# Patient Record
Sex: Female | Born: 1945
Health system: Southern US, Community
[De-identification: ages and names within clinical notes are randomized; demographics above are authoritative.]

## PROBLEM LIST (undated history)

## (undated) DIAGNOSIS — E785 Hyperlipidemia, unspecified: Secondary | ICD-10-CM

## (undated) DIAGNOSIS — E119 Type 2 diabetes mellitus without complications: Secondary | ICD-10-CM

## (undated) DIAGNOSIS — F329 Major depressive disorder, single episode, unspecified: Secondary | ICD-10-CM

## (undated) DIAGNOSIS — Z87442 Personal history of urinary calculi: Secondary | ICD-10-CM

## (undated) DIAGNOSIS — F32A Depression, unspecified: Secondary | ICD-10-CM

## (undated) DIAGNOSIS — K589 Irritable bowel syndrome without diarrhea: Secondary | ICD-10-CM

## (undated) DIAGNOSIS — R011 Cardiac murmur, unspecified: Secondary | ICD-10-CM

## (undated) DIAGNOSIS — H9190 Unspecified hearing loss, unspecified ear: Secondary | ICD-10-CM

## (undated) DIAGNOSIS — R748 Abnormal levels of other serum enzymes: Secondary | ICD-10-CM

## (undated) DIAGNOSIS — C801 Malignant (primary) neoplasm, unspecified: Secondary | ICD-10-CM

## (undated) DIAGNOSIS — I10 Essential (primary) hypertension: Secondary | ICD-10-CM

## (undated) HISTORY — DX: Hyperlipidemia, unspecified: E78.5

## (undated) HISTORY — DX: Irritable bowel syndrome, unspecified: K58.9

## (undated) HISTORY — PX: KIDNEY STONE SURGERY: SHX686

## (undated) HISTORY — DX: Abnormal levels of other serum enzymes: R74.8

## (undated) HISTORY — PX: EYE SURGERY: SHX253

## (undated) HISTORY — DX: Essential (primary) hypertension: I10

## (undated) HISTORY — DX: Unspecified hearing loss, unspecified ear: H91.90

## (undated) HISTORY — DX: Type 2 diabetes mellitus without complications: E11.9

## (undated) HISTORY — PX: STAPEDECTOMY: SHX2435

## (undated) HISTORY — DX: Malignant (primary) neoplasm, unspecified: C80.1

## (undated) HISTORY — PX: CHOLECYSTECTOMY: SHX55

## (undated) HISTORY — PX: BACK SURGERY: SHX140

---

## 1980-12-19 HISTORY — PX: TOTAL ABDOMINAL HYSTERECTOMY: SHX209

## 1997-12-19 DIAGNOSIS — C801 Malignant (primary) neoplasm, unspecified: Secondary | ICD-10-CM

## 1997-12-19 HISTORY — DX: Malignant (primary) neoplasm, unspecified: C80.1

## 1997-12-19 HISTORY — PX: OTHER SURGICAL HISTORY: SHX169

## 1998-12-19 DIAGNOSIS — I10 Essential (primary) hypertension: Secondary | ICD-10-CM

## 1998-12-19 HISTORY — DX: Essential (primary) hypertension: I10

## 2003-01-03 ENCOUNTER — Ambulatory Visit (HOSPITAL_COMMUNITY): Admission: RE | Admit: 2003-01-03 | Discharge: 2003-01-03 | Payer: Self-pay | Admitting: Internal Medicine

## 2003-01-03 ENCOUNTER — Encounter (INDEPENDENT_AMBULATORY_CARE_PROVIDER_SITE_OTHER): Payer: Self-pay | Admitting: Internal Medicine

## 2003-06-24 ENCOUNTER — Encounter (INDEPENDENT_AMBULATORY_CARE_PROVIDER_SITE_OTHER): Payer: Self-pay | Admitting: Internal Medicine

## 2003-06-24 ENCOUNTER — Ambulatory Visit (HOSPITAL_COMMUNITY): Admission: RE | Admit: 2003-06-24 | Discharge: 2003-06-24 | Payer: Self-pay | Admitting: Internal Medicine

## 2003-06-26 ENCOUNTER — Encounter (HOSPITAL_COMMUNITY): Admission: RE | Admit: 2003-06-26 | Discharge: 2003-07-26 | Payer: Self-pay | Admitting: Neurosurgery

## 2003-07-29 ENCOUNTER — Encounter (HOSPITAL_COMMUNITY): Admission: RE | Admit: 2003-07-29 | Discharge: 2003-08-28 | Payer: Self-pay | Admitting: Orthopedic Surgery

## 2003-09-10 ENCOUNTER — Encounter: Payer: Self-pay | Admitting: Neurosurgery

## 2003-09-12 ENCOUNTER — Encounter: Payer: Self-pay | Admitting: Neurosurgery

## 2003-09-12 ENCOUNTER — Ambulatory Visit (HOSPITAL_COMMUNITY): Admission: RE | Admit: 2003-09-12 | Discharge: 2003-09-13 | Payer: Self-pay | Admitting: Neurosurgery

## 2004-01-20 ENCOUNTER — Ambulatory Visit (HOSPITAL_COMMUNITY): Admission: RE | Admit: 2004-01-20 | Discharge: 2004-01-20 | Payer: Self-pay | Admitting: Internal Medicine

## 2004-01-20 HISTORY — PX: COLONOSCOPY: SHX174

## 2005-09-06 ENCOUNTER — Ambulatory Visit (HOSPITAL_COMMUNITY): Admission: RE | Admit: 2005-09-06 | Discharge: 2005-09-06 | Payer: Self-pay | Admitting: Family Medicine

## 2005-09-15 ENCOUNTER — Encounter (HOSPITAL_COMMUNITY): Admission: RE | Admit: 2005-09-15 | Discharge: 2005-09-17 | Payer: Self-pay | Admitting: Family Medicine

## 2005-09-18 HISTORY — PX: ESOPHAGOGASTRODUODENOSCOPY: SHX1529

## 2005-09-30 ENCOUNTER — Ambulatory Visit: Payer: Self-pay | Admitting: Internal Medicine

## 2005-10-18 ENCOUNTER — Ambulatory Visit (HOSPITAL_COMMUNITY): Admission: RE | Admit: 2005-10-18 | Discharge: 2005-10-18 | Payer: Self-pay | Admitting: Internal Medicine

## 2005-10-18 ENCOUNTER — Ambulatory Visit: Payer: Self-pay | Admitting: Internal Medicine

## 2005-10-21 ENCOUNTER — Ambulatory Visit (HOSPITAL_COMMUNITY): Admission: RE | Admit: 2005-10-21 | Discharge: 2005-10-21 | Payer: Self-pay | Admitting: Internal Medicine

## 2005-12-01 ENCOUNTER — Ambulatory Visit: Payer: Self-pay | Admitting: Cardiology

## 2005-12-22 ENCOUNTER — Ambulatory Visit: Payer: Self-pay | Admitting: Cardiology

## 2005-12-27 ENCOUNTER — Ambulatory Visit: Payer: Self-pay | Admitting: *Deleted

## 2005-12-27 ENCOUNTER — Ambulatory Visit (HOSPITAL_COMMUNITY): Admission: RE | Admit: 2005-12-27 | Discharge: 2005-12-27 | Payer: Self-pay | Admitting: Cardiology

## 2006-02-13 ENCOUNTER — Ambulatory Visit (HOSPITAL_COMMUNITY): Admission: RE | Admit: 2006-02-13 | Discharge: 2006-02-14 | Payer: Self-pay | Admitting: General Surgery

## 2006-02-13 ENCOUNTER — Encounter (INDEPENDENT_AMBULATORY_CARE_PROVIDER_SITE_OTHER): Payer: Self-pay | Admitting: *Deleted

## 2007-01-29 ENCOUNTER — Ambulatory Visit (HOSPITAL_COMMUNITY): Admission: RE | Admit: 2007-01-29 | Discharge: 2007-01-29 | Payer: Self-pay | Admitting: Family Medicine

## 2008-11-18 ENCOUNTER — Ambulatory Visit (HOSPITAL_COMMUNITY): Admission: RE | Admit: 2008-11-18 | Discharge: 2008-11-18 | Payer: Self-pay | Admitting: Family Medicine

## 2009-01-28 ENCOUNTER — Inpatient Hospital Stay (HOSPITAL_COMMUNITY): Admission: RE | Admit: 2009-01-28 | Discharge: 2009-01-29 | Payer: Self-pay | Admitting: Urology

## 2010-09-24 ENCOUNTER — Ambulatory Visit (HOSPITAL_COMMUNITY): Admission: RE | Admit: 2010-09-24 | Discharge: 2010-09-24 | Payer: Self-pay | Admitting: Family Medicine

## 2010-11-29 ENCOUNTER — Ambulatory Visit (HOSPITAL_COMMUNITY)
Admission: RE | Admit: 2010-11-29 | Discharge: 2010-11-29 | Payer: Self-pay | Source: Home / Self Care | Attending: Family Medicine | Admitting: Family Medicine

## 2010-12-19 DIAGNOSIS — E119 Type 2 diabetes mellitus without complications: Secondary | ICD-10-CM

## 2010-12-19 HISTORY — DX: Type 2 diabetes mellitus without complications: E11.9

## 2011-04-05 LAB — BASIC METABOLIC PANEL
CO2: 26 mEq/L (ref 19–32)
Calcium: 8.5 mg/dL (ref 8.4–10.5)
Chloride: 104 mEq/L (ref 96–112)
Chloride: 106 mEq/L (ref 96–112)
Creatinine, Ser: 0.79 mg/dL (ref 0.4–1.2)
Creatinine, Ser: 0.92 mg/dL (ref 0.4–1.2)
GFR calc Af Amer: >60 mL/min (ref >60)
GFR calc non Af Amer: >60 mL/min (ref >60)
Glucose, Bld: 122 mg/dL — ABNORMAL HIGH (ref 70–99)
Glucose, Bld: 126 mg/dL — ABNORMAL HIGH (ref 70–99)
Glucose, Bld: 160 mg/dL — ABNORMAL HIGH (ref 70–99)
Potassium: 3.8 mEq/L (ref 3.5–5.1)
Potassium: 3.8 mEq/L (ref 3.5–5.1)
Sodium: 137 mEq/L (ref 135–145)
Sodium: 138 mEq/L (ref 135–145)
Sodium: 139 mEq/L (ref 135–145)

## 2011-04-05 LAB — TYPE AND SCREEN: Antibody Screen: NEGATIVE

## 2011-04-05 LAB — CBC
HCT: 31 % — ABNORMAL LOW (ref 36.0–46.0)
MCHC: 33.2 g/dL (ref 30.0–36.0)
MCHC: 33.6 g/dL (ref 30.0–36.0)
MCHC: 34.1 g/dL (ref 30.0–36.0)
MCV: 84.8 fL (ref 78.0–100.0)
Platelets: 189 10*3/uL (ref 150–400)
RBC: 3.66 MIL/uL — ABNORMAL LOW (ref 3.87–5.11)
WBC: 7.7 10*3/uL (ref 4.0–10.5)

## 2011-04-05 LAB — ABO/RH: ABO/RH(D): A NEG

## 2011-05-03 NOTE — Op Note (Signed)
Christie Christie [name], Christie Christie [name].:  [id number]   MEDICAL RECORD Christie [name].:  [id number]          PATIENT TYPE:  INP   LOCATION:  0001                         FACILITY:  [facility]   PHYSICIAN:  Christie Christie [name], M.D.  DATE OF BIRTH:  [birth year]   DATE OF PROCEDURE:  01/28/2009  DATE OF DISCHARGE:                               OPERATIVE REPORT   ATTENDING SURGEON:  Dr. Barron [name].   ASSISTANT:  Dr. Duane [name].   PREOPERATIVE DIAGNOSIS:  Left staghorn renal calculus, greater than 2  cm.   POSTOPERATIVE DIAGNOSES:  1. Left staghorn renal calculus, greater than 2 cm.  2. Left proximal ureteral stone.   PROCEDURES:  1. Left percutaneous nephrolithotomy for stone greater than 2 cm.  2. Renal pelvic basket stone extraction.  3. Left ureteroscopy with basket stone extraction.  4. Placement of a left nephrostomy tube.  5. Left pyelogram.   INDICATIONS:  Christie Christie [name] is a 65 year old female who was referred for  evaluation of the left partial staghorn calculus.  This was initially  diagnosed in December but treatment was delayed due to the patient's  need to take care of her husband.  Her surgery was ultimately scheduled  for today on an elective basis.  She was sent to interventional  radiology for placement of her percutaneous tube.  Please note prior to  the procedure, cultures from the clinic were treated with culture  specific antibiotics.  Risks and benefits have been discussed with the  patient preoperatively.   FINDINGS:  1. Left renal pelvic stone, staghorn, greater than 2 cm.  2. Left proximal ureteral stone.   PROCEDURE IN DETAIL:  The patient was brought back to the operating room  and after the successful induction of general endotracheal anesthetic,  she was placed in the prone position and all pressure points were padded  appropriately and SCDs were in place.  A Foley catheter was placed prior  to positioning her prone.  She received IV culture specific  antibiotics.   Interventional radiology team was present and they dilated the  previously placed nephrostomy tube site.  Once dilation was complete  with the balloon dilator, an access sheath was placed into the renal  collecting system.  At this point, the urology team took over the  control of the patient's care.   At this point, using the rigid renal scope, the collecting system of the  left kidney was visualized.  A large-volume stone could be seen  throughout the pelvis and lower pole of the kidney.  Using the  Lithoclast device, with a combination of ultrasound and pneumatic  energy, the stone was broken into small pieces and suctioned out.  Larger fragments were grasped with a two-pronged grasper and removed in  their entirety.  Once the renal pelvis and lower pole were cleaned out  with Christie [name] visible stone seen fluoroscopically, the flexible cystoscope was  used to examine all the calyces.  Christie [name] further stones could be seen and at  this point, we used the ureteroscope to navigate down the ureter.  In  the  proximal portion of the ureter, a friable stone could be seen which  was grasped with a basket and removed.  We then completed ureteroscopy  by navigating the ureteroscope all the way down to the UVJ.  Christie [name] further  stone or particles could be seen.  At this point the 18-French Council  tip catheter was placed in the collecting system under fluoroscopic  guidance.  A pyelogram was shot by inserting contrast into the catheter  and the collecting system was identified.  3 mL of normal saline were  placed in the nephrostomy balloon and the nephrostomy tube was secured  the skin with two silk sutures.  At this point the procedure was ended.  Please note Dr. Isabel Christie [name] was the responsible surgeon and present  throughout the entirety of the case.   ESTIMATED BLOOD LOSS:  Minimal.   URINE OUTPUT:  Unrecorded.   DRAINS:  1. Foley catheter.  2. Left nephrostomy tube.   SPECIMENS:  Stone  for stone analysis.   DISPOSITION:  The patient will go the PACU for further.      Christie Kitten, MD      Christie Christie [name], M.D.  Electronically Signed    DW/MEDQ  D:  01/28/2009  T:  01/28/2009  Job:  [id number]

## 2011-05-06 NOTE — Op Note (Signed)
NAME:  Christie [name], Christie [name]                             ACCOUNT NO.:  [id number]   MEDICAL RECORD NO.:  [id number]                   PATIENT TYPE:  AMB   LOCATION:  DAY                                  FACILITY:  [facility]   PHYSICIAN:  Lionel December, M.D.                 DATE OF BIRTH:  May 01, 1946   DATE OF PROCEDURE:  01/20/2004  DATE OF DISCHARGE:                                 OPERATIVE REPORT   PROCEDURE:  Colonoscopy with ileoscopy, terminal ileoscopy.   ENDOSCOPIST:  Lionel December, M.D.   INDICATIONS:  Autumnrose is a 65 year old Caucasian female who has been  experiencing bilateral lower quadrant abdominal pain also with change in her  bowel habits. It is suspected that she may have IBS.  We have arranged  colonoscopy to make sure that she does not have inflammatory bowel disease  or colonic neoplasm.  The procedure and risks were reviewed with the patient  and informed consent was obtained.   PREOPERATIVE MEDICATIONS:  Cetacaine spray for topical oropharyngeal  anesthesia.  Demerol 50 mg IV and Versed 6 mg IV in divided dose.   FINDINGS:  Procedure performed in endoscopy suite.  The patient's vital  signs and O2 saturation were monitored during the procedure and remained  stable.  The patient was placed in the left lateral recumbent position and  rectal examination was performed.  No abnormality noted on external or  digital exam.   Olympus videoscope was placed in the rectum and advanced under vision into  the sigmoid colon and beyond.  Preparation was satisfactory.  The scope was  passed to the cecum which was identified by intravenous and appendiceal  orifice.  A short segment of TL was also examined and was normal.  Pictures  were taken for the record.  As the scope was withdrawn the colonic mucosa  was, once again, carefully examined and was normal throughout.  Rectal  mucosa similarly was normal.  The scope was retroflexed to examine the  anorectal junction and a single small  papilla was noted along with 2 tiny  hemorrhoids below the dentate line.   The endoscope was straightened and withdrawn.  The patient tolerated the  procedure well.   FINAL DIAGNOSES:  1. Small external hemorrhoids and a single anal papilla, otherwise normal     colonoscopy.  2. Normal terminal ileum.  3. Suspect symptoms are due to irritable bowel syndrome.   RECOMMENDATIONS:  1. High fiber diet.  2. Citrucel 1 tablespoonful daily.  3. Levbid 1/2 to 1 tablet every morning.  4. If she does not notice improvement in symptoms she will call our office     with progress report in a few weeks.  If this therapy does not work we     will consider dicyclomine.      ___________________________________________  Lionel December, M.D.   NR/MEDQ  D:  01/20/2004  T:  01/20/2004  Job:  [id number]   cc:   Ramon Dredge L. Juanetta Gosling, M.D.  [address]  [place]  [place] [address]  Fax: [phone]

## 2011-05-06 NOTE — Consult Note (Signed)
NAMEAMARIYAH, BAZAR NO.:  [id number]   MEDICAL RECORD NO.:  [id number]                  PATIENT TYPE:   LOCATION:                                       FACILITY:   PHYSICIAN:  Lionel December, M.D.                 DATE OF BIRTH:  [birth year]   DATE OF CONSULTATION:  12/30/2003  DATE OF DISCHARGE:                                   CONSULTATION   GI CONSULT   REQUESTING PHYSICIAN:  Oneal Deputy. Juanetta Gosling, M.D.   HISTORY OF PRESENT ILLNESS:  This patient is a 65 year old Caucasian female  who has been followed in our office for transaminitis most recently.  Today  she comes in with complaints of bilateral lower quadrant abdominal pain and  cramping, typically in the morning as well as postprandially.  She reports  that her bowel movements are between 1-4 per day. They are non-mucus-like  and without any melena or rectal bleeding.  She did undergo flexible  sigmoidoscopy by Dr. Karilyn Cota in 2000 which was normal to 50 cm on July 05, 1999.  She has never had screening colonoscopy.   She was last seen in our office on June 20, 2003 for elevated LFTs.  This has  resolved and only SGPT is slightly elevated at 54 on December 03, 2003 as  she has discontinued her Lipitor.  She was started on Prilosec approximately  2 weeks ago and this has not helped with the pain, although her pain is  generally in the lower abdominal area.  She does have a history of a small  pancreatic 13 x 9 mm, a small left renal cyst 3.1 cm in greatest diameter  and a few nonspecific upper normal size mesenteric lymph nodes in the small  bowel mesentery and left upper quadrant.  However, CT scan from June 24, 2003  was essentially stable.   CURRENT MEDICATIONS:  1. Avalide 12.5 mg daily.  2. Votrex 1 tablet daily.  3. Over-the-counter antacids p.r.n.  4. Prilosec 20 mg daily.   ALLERGIES:  PERCOCET (withdrawal symptoms).   PAST MEDICAL HISTORY:  1. Normal flexible sigmoidoscopy by Dr.  Karilyn Cota in 2000.  2. Vulvar carcinoma with surgical removal in 1999 by Dr. Marlane Hatcher.  3. Hypertension.  4. Hyperlipidemia.  5. Chronic back pain.  6. Back surgery with disk repair of L4 and L5 by Dr. __________.  7. Total abdominal hysterectomy in 1983.  8. Bilateral ear surgeries.   FAMILY HISTORY:  No known family history of colorectal carcinoma.  There is  a strong family history of pancreatic carcinoma with mother diagnosed at age  74, deceased at age 22; and brother deceased at age 51.  She also reports  her brother had a rare blood dyscrasia.  She has 1 sister, deceased, with  renal carcinoma; and 1 brother deceased secondary to MVA.  Father deceased  at age  84 with dementia.   SOCIAL HISTORY:  Ms. Kearse has been married for 39 years. She has 1  granddaughter who is healthy.  She is currently retired; however, she baby  sits her grandchildren 3 days a week. She denies any tobacco, alcohol, or  drug use.   REVIEW OF SYSTEMS:  CONSTITUTIONAL:  Weight is steadily increased. Denies  any fever or chills.  Denies any fatigue.  Appetite is good. CARDIOVASCULAR:  Denies any chest pain or palpitations.  PULMONARY:  Denies any shortness of  breath, cough or dyspnea.  SKIN:  Denies any rash or jaundice.  GASTROINTESTINAL:  See HPI.  Denies any dysphagia or odynophagia.  Denies  any heartburn or indigestion.   PHYSICAL EXAMINATION:  VITAL SIGNS:  Weight 191 pounds.  Blood pressure  120/82, pulse 70.  GENERAL:  This patient is a 65 year old Caucasian female who is alert,  oriented, pleasant and cooperative.  HEENT:  Sclerae are clear, nonicteric. Conjunctivae pink. Oropharynx pink  and moist without any lesions.  NECK:  Supple without any masses or thyromegaly.  HEART:  Regular rate and rhythm without murmurs, clicks, rubs or gallops.  Normal S1-S2.  LUNGS:  Clear to auscultation bilaterally.  ABDOMEN:  Positive bowel sounds x4.  Soft, nontender, nondistended with no  palpable mass or  organomegaly.  RECTAL:  There are small 1-2 cm external hemorrhoids visualized.  No  erythema or active bleeding.  Good sphincter tone.  There is a small amount  of light brown Hemoccult negative stool obtained.  EXTREMITIES:  2+ pedal pulses bilaterally.  No edema.  SKIN:  Pink, warm and dry, without any rash or jaundice.   LABORATORY STUDIES:  Last LFTs from December 03, 2003:  Total bilirubin 0.7,  direct 0.1, indirect 0.6, alkaline phosphatase 103, SGOT 34, SGPT 54, total  protein 6.7, albumin 4.0.   ASSESSMENT:  1. This patient is a 65 year old Caucasian female with relatively new onset     of bilateral lower quadrant abdominal pain and cramping; may be a variant     of irritable bowel syndrome given some loose stools as well; however, she     has never had screening colonoscopy and would like to perform this exam     to rule out colorectal carcinoma.  2. Elevated transaminitis; this has resolved quite well given     discontinuation of Lipitor.  We will repeat LFTs in approximately 1     month.   RECOMMENDATIONS:  1. Screening colonoscopy is scheduled with Dr. Karilyn Cota.  I have discussed the     risks and benefits of this procedure with the patient which include, but     are not limited to bleeding, perforation and infection.  She agrees with     this     plan. Consent will be obtain.  2. Will obtain labs today to include CBC and sedimentation rate.  3. Further recommendations and follow up pending colonoscopy.     ________________________________________  ___________________________________________  Nicholas Lose, N.P.                  Lionel December, M.D.   KC/MEDQ  D:  12/30/2003  T:  12/30/2003  Job:  [id number]   cc:   Ramon Dredge L. Juanetta Gosling, M.D.  [address]  [place]  [place] [address]  Fax: [phone]   Lionel December, M.D.  [address]  [place]  [place] [address]  Fax: [phone]

## 2011-05-06 NOTE — Procedures (Signed)
[name], Christie [name] NO.:  [id number]   MEDICAL RECORD NO.:  [id number]          PATIENT TYPE:  OUT   LOCATION:  RAD                           FACILITY:  [facility]   PHYSICIAN:  [place] Bing, M.D. [facility] BIRTH:  May 20, 1946   DATE OF PROCEDURE:  12/27/2005  DATE OF DISCHARGE:                                  ECHOCARDIOGRAM   PROCEDURE:  Echocardiogram.   CLINICAL DATA:  A 66 year old woman with a systolic murmur and hypertension.  M-mode aorta 2.8, left atrium 3.9, septum 1.3, posterior wall 1.1, LV  diastole 3.9, LV systole 2.4.  1.  Technically adequate echocardiographic study.  2.  Left atrial size at the upper limit of normal; normal right atrium and      right ventricle.  3.  Trileaflet and normal aortic valve; trivial aortic insufficiency.  4.  Normal aortic root.  5.  Slight mitral valve thickening; mild annular calcification.  6.  Normal tricuspid valve with physiologic regurgitation and slightly      increased estimated RV systolic pressure.  7.  Normal pulmonic valve and proximal pulmonary artery.  8.  Normal IVC.  9.  Normal left ventricular size; borderline hypertrophy; normal regional      and global function.      [place] Bing, M.D. [facility]  Electronically Signed     RR/MEDQ  D:  12/27/2005  T:  12/28/2005  Job:  [id number]

## 2011-05-06 NOTE — Op Note (Signed)
Christie [name], Christie [name]                   ACCOUNT NO.:  [id number]   MEDICAL RECORD NO.:  [id number]          PATIENT TYPE:  AMB   LOCATION:  DAY                           FACILITY:  [facility]   PHYSICIAN:  Lionel December, M.D.    DATE OF BIRTH:  1946-04-13   DATE OF PROCEDURE:  10/18/2005  DATE OF DISCHARGE:                                 OPERATIVE REPORT   PROCEDURE:  Esophagogastroduodenoscopy.   INDICATIONS:  Johniya is a 65 year old Caucasian female with over two-month  history of recurrent chest pain. Noninvasive cardiac evaluations have been  negative. She has not found any triggers other than pain may be experienced  with her meals. She has been on PPI for two weeks but does not report any  improvement. She has history of pancreatic cyst which has been stable on  sequential CTs.   Procedure risks were reviewed with the patient, and informed consent was  obtained.   PREMEDICATION:  Cetacaine spray for pharyngeal topical anesthesia, Demerol  50 mg IV, Versed 6 mg IV in divided dose.   FINDINGS:  Procedure performed in endoscopy suite. The patient's vital signs  and O2 saturation were monitored during the procedure and remained stable.  The patient was placed left lateral position, and Olympus videoscope was  passed oropharynx without any difficulty into esophagus.   Esophagus. Mucosa of the esophagus normal throughout. GE junction at 39 cm.  No hernia was noted.   Stomach. It was empty and distended very well insufflation. Folds of  proximal stomach were normal. Examination of mucosa revealed few small  antral erosions but no ulcer crater was found. Angularis, fundus and cardia  were examined by retroflexing the scope were normal.   Duodenum. Bulbar mucosa was normal. Scope was passed to the second part of  duodenum where mucosa and folds were normal. Endoscope was withdrawn. The  patient tolerated the procedure well.   FINAL DIAGNOSIS:  Erosive antral gastritis, otherwise normal  EGD.   I doubt that this will explain the patient's symptomatology.   RECOMMENDATIONS:  1.  She will continue anti-reflux measures and PPI for now.  2.  H pylori serology.  3.  Upper abdominal ultrasound.      Lionel December, M.D.  Electronically Signed     NR/MEDQ  D:  10/18/2005  T:  10/18/2005  Job:  [id number]   cc:   Lorin Picket A. Gerda Diss, MD  Fax: [phone]

## 2011-05-06 NOTE — Op Note (Signed)
Christie [name], Christie [name] NO.:  [id number]   MEDICAL RECORD NO.:  [id number]          PATIENT TYPE:  OIB   LOCATION:  5731                         FACILITY:  [facility]   PHYSICIAN:  Adolph Pollack, M.D.DATE OF BIRTH:  March 19, 1946   DATE OF PROCEDURE:  02/13/2006  DATE OF DISCHARGE:                                 OPERATIVE REPORT   PREOP DIAGNOSIS:  Symptomatic cholelithiasis.   POSTOP:  Symptomatic cholelithiasis.   PROCEDURE:  Laparoscopic cholecystectomy with intraoperative cholangiogram.   SURGEON:  Rosenbower.   ASSISTANT:  Thomas Cornett.   ANESTHESIA:  General.   INDICATIONS:  Christie [name] is a 65 year old female who has had some pressure-  type sensation mid chest which were with shortness of breath. She had a  Cardiolite scan which was unremarkable. She was discovered to have  gallstones but no gallbladder wall thickening.  It was felt that possibly  these symptoms could be related to her gallbladder and she now presents for  laparoscopic cholecystectomy.   TECHNIQUE:  She was seen in the holding area and brought to the operating  room, placed on the operating table and a general anesthetic was  administered. The abdominal wall was sterilely prepped and draped. Dilute  Marcaine solution was infiltrated in the subumbilical region and a small  subumbilical incision was made through the skin and subcutaneous tissue  until the midline fascia was exposed. Incision was made in the midline  fascia and peritoneum entering the peritoneal cavity under direct vision. A  pursestring suture of 0 Vicryl was placed around the fascial edges. A Hasson  trocar was introduced into the peritoneal cavity and pneumoperitoneum  created by insufflation of CO2 gas.   Next a laparoscope was introduced. She is placed in reverse Trendelenburg  position, the right side tilted slightly upward. An 11-mm trocar was placed  through an epigastric incision and two 5 mm trocars were  placed in the right  mid lateral abdomen. The fundus of the gallbladder was identified, grasped  and retracted toward the right shoulder. No significant adhesions were noted  to the gallbladder and omentum. There were some adhesions between the  infundibulum, the gallbladder and duodenum which were taken down bluntly.  Using careful blunt dissection. I mobilized the infundibulum. I then  isolated the cystic duct and created a window around it. A clip was placed  at the cystic duct gallbladder junction and a small incision made in the  cystic duct. A Cholangiocatheter was placed into the cystic duct and a  cholangiogram was performed.   Under real time real-time fluoroscopy, dilute contrast material was injected  to the cystic duct which was of moderate length. The common hepatic, right  hepatic, and left hepatic ducts all filled promptly. The common bile duct  also filled promptly and draped contrast into the duodenum without obvious  evidence of obstruction. Final reports pending radiologist's interpretation.   Following this, the cholangiocatheter was removed, the cystic duct was  clipped three times proximally and divided. I then proceeded to identify the  posterior and anterior branch of the cystic  artery. These were isolated,  clipped and divided. The gallbladder was dissected free from the liver bed  intact using electrocautery. Small amount of bile did leak out the  gallbladder area.  Gallbladder was placed in Endopouch bag.   The gallbladder fossa was irrigated. Bleeding points controlled with  electrocautery. It was irrigated again and reinspected. No bleeding or bile  leak was noted. Irrigation fluid was then evacuated.   The gallbladder was removed in the Endopouch bag through the subumbilical  port. The subumbilical fascial defect closed under laparoscopic vision by  tightening up and tying down the pursestring suture. Remaining trocars were  removed and pneumoperitoneum  was released. The skin incisions were closed  with 4-0 Monocryl subcuticular stitches followed by Steri-Strips and sterile  dressings.   She tolerated procedure without any apparent complications and was taken to  recovery in satisfactory condition.      Adolph Pollack, M.D.  Electronically Signed     TJR/MEDQ  D:  02/13/2006  T:  02/14/2006  Job:  [id number]   cc:   Lionel December, M.D.  [address]  [place]  [place] [address]   Scott A. Gerda Diss, MD  Fax: [phone]

## 2011-05-06 NOTE — Op Note (Signed)
NAME:  Christie [name]                             ACCOUNT NO.:  [id number]   MEDICAL RECORD NO.:  [id number]                   PATIENT TYPE:  OIB   LOCATION:  3009                                 FACILITY:  [facility]   PHYSICIAN:  Danae Orleans. Venetia Maxon, M.D.               DATE OF BIRTH:  May 27, 1946   DATE OF PROCEDURE:  09/12/2003  DATE OF DISCHARGE:  09/13/2003                                 OPERATIVE REPORT   PREOPERATIVE DIAGNOSIS:  Herniated lumbar disk, L4-5 left with spondylosis,  degenerative disk disease and lumbar radiculopathy.   POSTOPERATIVE DIAGNOSIS:  Herniated lumbar disk, L4-5 left with spondylosis,  degenerative disk disease and lumbar radiculopathy.   PROCEDURE:  Left L4-5 microdiskectomy with microdissection.   SURGEON:  Danae Orleans. Venetia Maxon, M.D.   ANESTHESIA:  General endotracheal anesthesia.   ESTIMATED BLOOD LOSS:  Minimal.   COMPLICATIONS:  None.   DISPOSITION:  Recovery.   INDICATIONS FOR PROCEDURE:  Christie [name] is a 65 year old woman with an L5  radiculopathy on the left with a herniated disk and foraminal stenosis with  facet hypertrophy and lateral recess stenosis. It was elected to take her to  surgery for microdiskectomy.   DESCRIPTION OF PROCEDURE:  Christie [name] was brought to the operating room.  Following the satisfactory and uncomplicated induction of general  endotracheal anesthesia  and placement  of intravenous lines, the patient  was placed in the prone position  on the operating table on the Wilson  frame. Her low back was then prepped and draped in the usual sterile  fashion. The area of planned incision was infiltrated with 0.25% Marcaine  with 0.5% Lidocaine and 1:200,000 epinephrine.   An incision was made in the midline over the L4-5 interspace and carried  through copious adipose tissue  to the lumbodorsal fascia which was incised  on the left side of the midline. Subperiosteal dissection was performed  exposing the interspace. A marker probe was  placed which demonstrated this  to be the L3-4 interspace. Subsequently a more caudal dissection was  performed, exposing  the L4-5 interspace and an additional x-ray confirmed  this to be the correct  level.   A hemi semilaminectomy of L4 was then performed using the high-speed drill  and a variety of Kerrison rongeurs. The common dural tube was decompressed  and a foraminotomy was done overlying the L5 nerve root. A medial  facetectomy was performed as well. This joint  appeared  to be quite  unhealthy with synovial cyst and also a significant space between  the  inferior facet of L4 and the superior facet of L5. This area and lateral  recess were decompressed and the lateral aspect of the spinal canal was  decompressed.   The microscope was brought into the field, and using microdissection  technique, the lateral  thecal sac was mobilized medially, exposing a  foraminal and preforaminal disk  herniation. The bipolar electrocautery was  used to cauterize the epidural veins. The D'Errico nerve root retractor was  utilized to mobilize the  thecal sac medially and the interspace was incised  with a #15 blade. Disk material was removed in a piecemeal fashion and  medial and lateral aspects of the disk space were evacuated of residual disk  material using a variety of pituitary rongeurs and Epstein curets.   The  thecal sac was felt to be well decompressed as was the L5 nerve root.  The foramen was also felt to be well decompressed. Hemostasis was assured  with bipolar electrocautery. The spinal canal was palpated and felt to be  well decompressed. There did not appear to be any residual disk material.   The self-retaining retractor was removed. The microscope was taken out of  the field. The lumbodorsal fascia was closed with 0 Vicryl suture. The  subcutaneous tissue was reapproximated with 2-0 Vicryl interrupted inverted  sutures and the skin edges were reapproximated with interrupted  3-0 Vicryl  subcuticular stitch. The wound was dressed with Dermabond.   The patient was extubated in the operating room and taken to the recovery  room in stable satisfactory condition having tolerated  the operation well.  All counts were correct at the end of the case.                                               Danae Orleans. Venetia Maxon, M.D.    JDS/MEDQ  D:  09/12/2003  T:  09/13/2003  Job:  [id number]

## 2011-05-06 NOTE — Consult Note (Signed)
[name], [name]                   ACCOUNT NO.:  [id number]   MEDICAL RECORD NO.:  [id number]          PATIENT TYPE:  AMB   LOCATION:                                FACILITY:  [facility]   PHYSICIAN:  Lionel December, M.D.    DATE OF BIRTH:  1946-04-15   DATE OF CONSULTATION:  09/30/2005  DATE OF DISCHARGE:                                   CONSULTATION   GASTROENTEROLOGY CONSULTATION:   CHIEF COMPLAINT:  Chest pain.   PHYSICIAN REQUESTING CONSULTATION:  Dr. Lilyan Punt   HISTORY OF PRESENT ILLNESS:  Patient is a 64 year old Caucasian female who  presents today for further evaluation of 70-month history of chest  heaviness/pressure.  This is sometimes associated with shortness of breath.  She also notes it if she eats too much at once.  Symptoms are intermittent.  She notes that when she lays down it seems to get better.  She has had some  mild typical heartburn symptoms.  No dysphagia, odynophagia, or abdominal  pain.  Bowel movements are regular.  No melena or rectal bleeding.  She has  never had an upper endoscopy.   She has had a Cardiolite which was negative.  She had an unremarkable chest  x-ray.  LFTs recently were normal.  She does have hypercholesterolemia with  elevated LDL.  She has not been on statins due to her elevated LFTs in the  past.  She recently started Zetia.  According to Dr. Fletcher Anon notes, there  were instructions for her to be on Aciphex.  She states she was never given  these instructions.  She has not been on any antacids or PPIs.  She takes an  aspirin one or two a day usually for pains or headache.   CURRENT MEDICATIONS:  1.  Avalide 12.5 mg daily.  2.  Hyoscyamine 0.375 mg half tablet daily.  3.  Zetia 10 mg daily.  4.  Aspirin one to two daily.   ALLERGIES:  No known drug allergies; however, she did have difficulty  withdrawing from [facility] which she used at some point for chronic back  pain.   PAST MEDICAL HISTORY:  1.  Colonoscopy with terminal  ileoscopy in February 2005 revealed small      external hemorrhoids and a single anal papula.  2.  IBS.  3.  Vulvar carcinoma with surgical removal in 1999 by Dr. Marlane [name].  4.  Hypertension.  5.  Hyperlipidemia.  6.  Chronic back pain with lumbar disk surgery.  7.  She had a total abdominal hysterectomy in 1983.  8.  Bilateral ear surgeries.   FAMILY HISTORY:  Negative for colorectal cancer.  She has a strong family  history of pancreatic cancer with mother diagnosed at age 68 deceased at age  56, brother deceased age 50.  She also reports a brother had rare blood  dyscrasia.  She had a sister deceased with renal carcinoma and another  brother deceased secondary to MVA.   SOCIAL HISTORY:  She is married.  She has a daughter with two grandchildren.  She is retired,  does not smoke or drink alcohol.   REVIEW OF SYSTEMS:  CONSTITUTIONAL:  No weight loss.  CARDIOPULMONARY:  No  chest pain.  No shortness of breath as outlined above.  She complains more  of dyspnea on exertion with climbing the stairs.  GI:  See HPI.   PHYSICAL EXAMINATION:  VITAL SIGNS:  Weight 196.5 (up 5 pounds since we last  saw her), temperature 98, blood pressure 134/82, pulse 72, height 5 feet 3-  1/2 inches.  GENERAL:  Pleasant, well-nourished, well-developed Caucasian female in no  acute distress.  SKIN:  Warm and dry.  No jaundice.  HEENT:  Pupils equal, round and reactive to light.  Conjunctivae are pink.  Sclerae nonicteric.  Oropharyngeal mucosa moist and pink.  No lesions,  erythema, or exudate.  No lymphadenopathy or thyromegaly.  CHEST:  Lungs are clear to auscultation.  Cardiac exam reveals regular rate  and rhythm, normal S1-S2.  No murmurs, rubs, or gallops.  ABDOMEN:  Positive bowel sounds, soft, nontender, nondistended.  No  organomegaly or masses.  No rebound tenderness or guarding.  No abdominal  bruits or hernias.  EXTREMITIES:  No edema.   IMPRESSION:  Christie [name] is a 65 year old lady who has a  42-month history of  atypical chest pain which is felt to be noncardiac in origin.  She describes  it as heaviness in her chest sometimes related to meals but other times not.  She has very mild typical reflux symptoms.  She has not been on proton pump  inhibitor therapy as of yet.  She is quite concerned given her significant  family history of various cancers.  I have offered her a trial of proton  pump inhibitor therapy prior to upper endoscopy.  We will begin Protonix 40  mg daily and 30 samples given.  We will go ahead and put her on the schedule  for upper endoscopy for 1-1/2 to 2 weeks in case she does not respond, that  way she will already be on the schedule.  If she is not significantly  improved, I have urged her to proceed with EGD as planned.  I would like to  thank Dr. Lilyan Punt for allowing [place] to take part in the care of this  patient.      Tana Coast, P.A.      Lionel December, M.D.  Electronically Signed    LL/MEDQ  D:  09/30/2005  T:  09/30/2005  Job:  [id number]   cc:   Lorin Picket A. Gerda Diss, MD  Fax: [phone]

## 2011-05-06 NOTE — Procedures (Signed)
NAME:  Christie [name], BUENAVENTURA                   ACCOUNT NO.:  [id number]   MEDICAL RECORD NO.:  [id number]          PATIENT TYPE:  OUT   LOCATION:  RAD                           FACILITY:  [facility]   PHYSICIAN:  Scott A. Gerda Diss, MD    DATE OF BIRTH:  1946/04/08   DATE OF PROCEDURE:  DATE OF DISCHARGE:  09/06/2005                                    STRESS TEST   PROCEDURE:  Stress Myoview.   INDICATIONS:  Chest discomfort and tightness.   RISK FACTORS:  On a resting EKG there is a no acute ST segment changes noted  on the resting EKG.   DESCRIPTION OF PROCEDURE:  Heart rate response to exercise; the patient's  heart rate gradually increased.  She hit her target heart rate of 135 near  the end of the second stage.   ST segment response to exercise; the patient had ST segment depressions in  lead 2, 3, AVF, and V5 and 6.  The patient did have slight shortness of  breath with activity.  The ST segments were specifically significant in lead  2 and also in AVF, not quite in V5 and V6.  Those were upsloping at 0.08  past the J point, where it looked okay.   Recovery phase; the patient's ST segment and response gradually resolved  over the course of the next several minutes and at five minutes was back to  looking normal.   Heart rate response to recovery; gradually resumed normal heart rate.   Blood pressure response.  The patient had a hypertensive response but it  finally came down late in the recovery phase.   INTERPRETATION:  Abnormal stress test with some ST segment changes noted  along with  a hypertensive response.  She needs to gradually get into a  walking program.  Await Myoview imaging.      Scott A. Gerda Diss, MD  Electronically Signed     SAL/MEDQ  D:  09/15/2005  T:  09/15/2005  Job:  [id number]

## 2011-06-16 ENCOUNTER — Ambulatory Visit (HOSPITAL_COMMUNITY)
Admission: RE | Admit: 2011-06-16 | Discharge: 2011-06-17 | Disposition: A | Discharge status: Home / Self Care | Payer: Medicare Other | Source: Ambulatory Visit | Attending: Ophthalmology | Admitting: Ophthalmology

## 2011-06-16 DIAGNOSIS — H35349 Macular cyst, hole, or pseudohole, unspecified eye: Secondary | ICD-10-CM | POA: Insufficient documentation

## 2011-06-16 DIAGNOSIS — E119 Type 2 diabetes mellitus without complications: Secondary | ICD-10-CM | POA: Insufficient documentation

## 2011-06-16 DIAGNOSIS — Z01812 Encounter for preprocedural laboratory examination: Secondary | ICD-10-CM | POA: Insufficient documentation

## 2011-06-16 DIAGNOSIS — I10 Essential (primary) hypertension: Secondary | ICD-10-CM | POA: Insufficient documentation

## 2011-06-16 DIAGNOSIS — Z79899 Other long term (current) drug therapy: Secondary | ICD-10-CM | POA: Insufficient documentation

## 2011-06-16 LAB — BASIC METABOLIC PANEL
GFR calc Af Amer: >60 mL/min (ref >60)
Glucose, Bld: 126 mg/dL — ABNORMAL HIGH (ref 70–99)
Sodium: 139 mEq/L (ref 135–145)

## 2011-06-16 LAB — GLUCOSE, CAPILLARY

## 2011-06-16 LAB — SURGICAL PCR SCREEN: Staphylococcus aureus: NEGATIVE

## 2011-06-16 LAB — AUTOLOGOUS SERUM PATCH PREP

## 2011-06-17 LAB — GLUCOSE, CAPILLARY
Glucose-Capillary: 137 mg/dL — ABNORMAL HIGH (ref 70–99)
Glucose-Capillary: 171 mg/dL — ABNORMAL HIGH (ref 70–99)
Glucose-Capillary: 173 mg/dL — ABNORMAL HIGH (ref 70–99)

## 2011-07-08 NOTE — Op Note (Signed)
NAMEGERRIE, Christie [name] NO.:  [id number]  MEDICAL RECORD NO.:  [id number]  LOCATION:  5124                         FACILITY:  [facility]  PHYSICIAN:  Beulah Gandy. Ashley Royalty, M.D. DATE OF BIRTH:  12/02/1946  DATE OF PROCEDURE:  06/16/2011 DATE OF DISCHARGE:                              OPERATIVE REPORT   ADMISSION DIAGNOSIS:  Macular hole, right eye.  PROCEDURES:  Repair of macular hole with pars plana vitrectomy, retinal photocoagulation, gas fluid exchange, membrane peel, serum patch, all in the right eye.  SURGEON:  Beulah Gandy. Ashley Royalty, MD  ASSISTANT:  Rosalie Doctor, MA  ANESTHESIA:  General.  DETAILS:  As usual prep and drape, inspection of the retinal periphery with the indirect ophthalmoscope showed weak areas.  The indirect ophthalmoscope laser was moved into place, 470 burns were placed around the retinal periphery in 2 rows.  The power was between 300 and 400 milliwatts, 1000 microns each at 0.1 seconds each.  Attention was carried to the pars plana area; 25-gauge trocars were placed at 8 and 10 o'clock, MVR incision 20-gauge at 2 o'clock.  Pars plana vitrectomy was begun just behind the cataractous lens.  The contact lens ring was anchored into place at 6 and 12 o'clock.  The flat contact lens was placed and the vitrectomy was carried posteriorly in a core fashion down to the macular surface.  A type 2B macular hole was seen with a surrounding drusen and opened eccentric area.  The silicone tip suction line was drawn down to the macular region and the fish strike sign occurred.  The posterior hyaloid was peeled with the lighted pick and the cutter from the retinal surface.  The vitrectomy was carried out to the periphery with a 30-degrees prismatic lens and all peripheral vitreous was removed from the mid periphery and far periphery.  The magnifying contact lens was brought into place.  The silicone tipped toward the diamond dusted membrane.  A peel device was  inserted through the 20-gauge opening and down to the macular surface.  The internal limiting membrane was peeled from around the hole at this diameter around the hole and toward the hole.  The macular hole edges were freed up nicely.  A total gas fluid exchange was then carried out.  Sufficient time was allowed for additional fluid to track down the walls of the eye collected in the posterior segment.  During this time the serum patch was prepared and C3F8 was prepared to a 16% concentration.  The additional fluid was removed with a [place] ophthalmics brush.  The serum patch was delivered.  The C3F8 16% was exchanged for intravitreal gas. The instruments and trocars were removed from the eye.  A 9-0 nylon was used to close the sclerotomy site.  The conjunctiva was closed with wet- field cautery.  Polymyxin and gentamicin were irrigated into [place] space.  Atropine solution was applied.  Marcaine was injected around the globe for postop pain.  Decadron 10 mg was injected to the lower subconjunctival space.  Closing pressure was 15 with a [place] tonometer.  Complications none.  Duration 1 hour.  The patient was awakened and taken to recovery  in satisfactory condition.     Beulah Gandy. Ashley Royalty, M.D.     JDM/MEDQ  D:  06/16/2011  T:  06/17/2011  Job:  [id number]  Electronically Signed by Alan Mulder M.D. on 07/08/2011 09:54:36 AM

## 2011-07-19 ENCOUNTER — Encounter (INDEPENDENT_AMBULATORY_CARE_PROVIDER_SITE_OTHER): Payer: Medicare Other | Admitting: Ophthalmology

## 2011-07-19 DIAGNOSIS — H3530 Unspecified macular degeneration: Secondary | ICD-10-CM

## 2011-07-19 DIAGNOSIS — H35349 Macular cyst, hole, or pseudohole, unspecified eye: Secondary | ICD-10-CM

## 2011-07-19 DIAGNOSIS — H2510 Age-related nuclear cataract, unspecified eye: Secondary | ICD-10-CM

## 2011-07-19 DIAGNOSIS — H43819 Vitreous degeneration, unspecified eye: Secondary | ICD-10-CM

## 2011-08-24 ENCOUNTER — Encounter (INDEPENDENT_AMBULATORY_CARE_PROVIDER_SITE_OTHER): Payer: Medicare Other | Admitting: Ophthalmology

## 2011-08-24 DIAGNOSIS — H35349 Macular cyst, hole, or pseudohole, unspecified eye: Secondary | ICD-10-CM

## 2011-08-24 DIAGNOSIS — H2510 Age-related nuclear cataract, unspecified eye: Secondary | ICD-10-CM

## 2011-08-24 DIAGNOSIS — H43819 Vitreous degeneration, unspecified eye: Secondary | ICD-10-CM

## 2012-02-22 ENCOUNTER — Ambulatory Visit (INDEPENDENT_AMBULATORY_CARE_PROVIDER_SITE_OTHER): Payer: Medicare Other | Admitting: Ophthalmology

## 2012-02-22 DIAGNOSIS — H43819 Vitreous degeneration, unspecified eye: Secondary | ICD-10-CM

## 2012-02-22 DIAGNOSIS — H3530 Unspecified macular degeneration: Secondary | ICD-10-CM

## 2012-02-22 DIAGNOSIS — H35349 Macular cyst, hole, or pseudohole, unspecified eye: Secondary | ICD-10-CM

## 2012-02-22 DIAGNOSIS — H2510 Age-related nuclear cataract, unspecified eye: Secondary | ICD-10-CM

## 2012-04-30 ENCOUNTER — Other Ambulatory Visit: Payer: Self-pay | Admitting: Family Medicine

## 2012-04-30 ENCOUNTER — Ambulatory Visit (HOSPITAL_COMMUNITY)
Admission: RE | Admit: 2012-04-30 | Discharge: 2012-04-30 | Disposition: A | Discharge status: Home / Self Care | Payer: Medicare Other | Source: Ambulatory Visit | Attending: Family Medicine | Admitting: Family Medicine

## 2012-04-30 DIAGNOSIS — M7730 Calcaneal spur, unspecified foot: Secondary | ICD-10-CM | POA: Insufficient documentation

## 2012-04-30 DIAGNOSIS — M79672 Pain in left foot: Secondary | ICD-10-CM

## 2012-04-30 DIAGNOSIS — M79609 Pain in unspecified limb: Secondary | ICD-10-CM | POA: Insufficient documentation

## 2012-10-01 ENCOUNTER — Other Ambulatory Visit: Payer: Self-pay | Admitting: Dermatology

## 2012-11-23 ENCOUNTER — Ambulatory Visit (INDEPENDENT_AMBULATORY_CARE_PROVIDER_SITE_OTHER): Payer: Medicare Other | Admitting: Ophthalmology

## 2012-11-23 DIAGNOSIS — H43819 Vitreous degeneration, unspecified eye: Secondary | ICD-10-CM

## 2012-11-23 DIAGNOSIS — H33329 Round hole, unspecified eye: Secondary | ICD-10-CM

## 2012-11-23 DIAGNOSIS — H35039 Hypertensive retinopathy, unspecified eye: Secondary | ICD-10-CM

## 2012-11-23 DIAGNOSIS — I10 Essential (primary) hypertension: Secondary | ICD-10-CM

## 2012-11-23 DIAGNOSIS — H2510 Age-related nuclear cataract, unspecified eye: Secondary | ICD-10-CM

## 2013-01-23 ENCOUNTER — Other Ambulatory Visit: Payer: Self-pay | Admitting: Family Medicine

## 2013-01-23 ENCOUNTER — Ambulatory Visit (HOSPITAL_COMMUNITY)
Admission: RE | Admit: 2013-01-23 | Discharge: 2013-01-23 | Disposition: A | Discharge status: Home / Self Care | Payer: Medicare Other | Source: Ambulatory Visit | Attending: Family Medicine | Admitting: Family Medicine

## 2013-01-23 DIAGNOSIS — R52 Pain, unspecified: Secondary | ICD-10-CM

## 2013-01-23 DIAGNOSIS — R609 Edema, unspecified: Secondary | ICD-10-CM

## 2013-01-23 DIAGNOSIS — M79609 Pain in unspecified limb: Secondary | ICD-10-CM | POA: Insufficient documentation

## 2013-01-23 DIAGNOSIS — M7989 Other specified soft tissue disorders: Secondary | ICD-10-CM | POA: Insufficient documentation

## 2013-02-23 ENCOUNTER — Encounter: Payer: Self-pay | Admitting: *Deleted

## 2013-03-07 ENCOUNTER — Other Ambulatory Visit: Payer: Self-pay | Admitting: Family Medicine

## 2013-03-07 LAB — BASIC METABOLIC PANEL
BUN: 17 mg/dL (ref 6–23)
Calcium: 9.4 mg/dL (ref 8.4–10.5)
Chloride: 102 mEq/L (ref 96–112)
Creat: 0.57 mg/dL (ref 0.50–1.10)

## 2013-03-07 LAB — HEPATIC FUNCTION PANEL
ALT: 15 U/L (ref 0–35)
AST: 15 U/L (ref 0–37)
Albumin: 4.3 g/dL (ref 3.5–5.2)
Alkaline Phosphatase: 84 U/L (ref 39–117)
Indirect Bilirubin: 0.4 mg/dL (ref 0.0–0.9)
Total Protein: 6.3 g/dL (ref 6.0–8.3)

## 2013-03-07 LAB — LIPID PANEL
Cholesterol: 166 mg/dL (ref 0–200)
HDL: 58 mg/dL (ref >39)
Triglycerides: 142 mg/dL (ref <150)

## 2013-03-08 ENCOUNTER — Encounter: Payer: Self-pay | Admitting: Family Medicine

## 2013-03-08 LAB — VITAMIN D 25 HYDROXY (VIT D DEFICIENCY, FRACTURES): Vit D, 25-Hydroxy: 43 ng/mL (ref 30–89)

## 2013-03-11 ENCOUNTER — Telehealth: Payer: Self-pay | Admitting: Family Medicine

## 2013-03-11 NOTE — Telephone Encounter (Signed)
Letter mailed to patient.

## 2013-04-12 ENCOUNTER — Encounter: Payer: Self-pay | Admitting: Family Medicine

## 2013-04-12 ENCOUNTER — Ambulatory Visit (INDEPENDENT_AMBULATORY_CARE_PROVIDER_SITE_OTHER): Payer: Medicare Other | Admitting: Family Medicine

## 2013-04-12 VITALS — BP 128/70 | Wt 203.0 lb

## 2013-04-12 DIAGNOSIS — E119 Type 2 diabetes mellitus without complications: Secondary | ICD-10-CM | POA: Insufficient documentation

## 2013-04-12 DIAGNOSIS — C519 Malignant neoplasm of vulva, unspecified: Secondary | ICD-10-CM | POA: Insufficient documentation

## 2013-04-12 NOTE — Progress Notes (Signed)
  Subjective:    Patient ID: Christie [name], female    DOB: [birth year], 67 y.o.   MRN: [id number]  HPIthis patient has a history of bulbar cancer. Had surgery many years ago. She is now noticing erythematous area in the thickened area at the base of the vagina that is sore and tender. She has had problems with yeast in the past and was using Nizoral cream for that. She denies any weight loss fever chills or abdominal pains. Past medical history family history social history reviewed   Review of Systemssee above denies vaginal bleeding no rectal bleeding     Objective:   Physical Exam Vital signs noted lungs are clear hearts regular vaginal exam urine edematous excoriated area at the base of the vagina opening with thickened skin concerning for the possibility of reoccurrence       Assessment & Plan:  Abnormal vaginal finding possible cancer referral to oncology/gynecology.

## 2013-04-12 NOTE — Patient Instructions (Signed)
I will call with your appointment

## 2013-04-17 ENCOUNTER — Telehealth: Payer: Self-pay | Admitting: Family Medicine

## 2013-04-17 NOTE — Telephone Encounter (Signed)
Patient notified

## 2013-04-17 NOTE — Telephone Encounter (Signed)
Nurse: Ilda BassetRobbie Lis  Refill Request: Xanax ** per the patient, [facility] faxed over request 04/12/13 and again 04/17/13.

## 2013-04-17 NOTE — Telephone Encounter (Signed)
May refill times 3 total

## 2013-04-17 NOTE — Telephone Encounter (Signed)
Called in refills on Xanax to [facility].

## 2013-05-14 ENCOUNTER — Other Ambulatory Visit: Payer: Self-pay | Admitting: Family Medicine

## 2013-06-03 ENCOUNTER — Telehealth: Payer: Self-pay | Admitting: Family Medicine

## 2013-06-03 NOTE — Telephone Encounter (Signed)
Advised patient that citalopram was refilled on 05/14/13 with 2 additional refills according to chart. Told patient to contact her pharmacy for a refill. Patient verbalized understanding.

## 2013-06-03 NOTE — Telephone Encounter (Signed)
Refill on her Citalopram 20mg  tabs, is leaving out of town on 6/21 and needs this refilled early if possible. Please advise pt if you can not do so, she will have to find a Pharmacy where she is going.Thanks

## 2013-07-12 ENCOUNTER — Other Ambulatory Visit: Payer: Self-pay | Admitting: Family Medicine

## 2013-07-22 ENCOUNTER — Other Ambulatory Visit: Payer: Self-pay | Admitting: Family Medicine

## 2013-07-23 ENCOUNTER — Encounter: Payer: Self-pay | Admitting: Family Medicine

## 2013-07-24 ENCOUNTER — Telehealth: Payer: Self-pay | Admitting: Family Medicine

## 2013-07-24 NOTE — Telephone Encounter (Signed)
Patient stated she will be out of town tomm and can't come to office. Patient stated she would like to keep her Friday pm appt.

## 2013-07-24 NOTE — Telephone Encounter (Signed)
Hyzaar 100/25 daily currently

## 2013-07-24 NOTE — Telephone Encounter (Signed)
No additional meds till I see her, she can move appt to Thurs am with me bring list of med, bring bp cuff as well

## 2013-07-24 NOTE — Telephone Encounter (Signed)
Patient daughter called stating her blood pressure is running high on Monday 170/80 and Tuesday 160/98 at 7pm, having headaches.Has appt. On Friday around 2. Daughter wants to know if she needs to change meds or up current meds. Or does she need to be seen before Friday.

## 2013-07-26 ENCOUNTER — Ambulatory Visit (INDEPENDENT_AMBULATORY_CARE_PROVIDER_SITE_OTHER): Payer: Medicare Other | Admitting: Family Medicine

## 2013-07-26 ENCOUNTER — Encounter: Payer: Self-pay | Admitting: Family Medicine

## 2013-07-26 VITALS — BP 174/88 | HR 76 | Ht 64.0 in | Wt 201.6 lb

## 2013-07-26 DIAGNOSIS — E119 Type 2 diabetes mellitus without complications: Secondary | ICD-10-CM

## 2013-07-26 MED ORDER — LOSARTAN POTASSIUM-HCTZ 100-25 MG PO TABS: 1.0000 | ORAL_TABLET | Freq: Every day | ORAL | Status: DC

## 2013-07-26 NOTE — Patient Instructions (Addendum)
DASH Diet  The DASH diet stands for "Dietary Approaches to Stop Hypertension." It is a healthy eating plan that has been shown to reduce high blood pressure (hypertension) in as little as 14 days, while also possibly providing other significant health benefits. These other health benefits include reducing the risk of breast cancer after menopause and reducing the risk of type 2 diabetes, heart disease, colon cancer, and stroke. Health benefits also include weight loss and slowing kidney failure in patients with chronic kidney disease.   DIET GUIDELINES  · Limit salt (sodium). Your diet should contain less than 1500 mg of sodium daily.  · Limit refined or processed carbohydrates. Your diet should include mostly whole grains. Desserts and added sugars should be used sparingly.  · Include small amounts of heart-healthy fats. These types of fats include nuts, oils, and tub margarine. Limit saturated and trans fats. These fats have been shown to be harmful in the body.  CHOOSING FOODS   The following food groups are based on a 2000 calorie diet. See your Registered Dietitian for individual calorie needs.  Grains and Grain Products (6 to 8 servings daily)  · Eat More Often: Whole-wheat bread, brown rice, whole-grain or wheat pasta, quinoa, popcorn without added fat or salt (air popped).  · Eat Less Often: White bread, white pasta, white rice, cornbread.  Vegetables (4 to 5 servings daily)  · Eat More Often: Fresh, frozen, and canned vegetables. Vegetables may be raw, steamed, roasted, or grilled with a minimal amount of fat.  · Eat Less Often/Avoid: Creamed or fried vegetables. Vegetables in a cheese sauce.  Fruit (4 to 5 servings daily)  · Eat More Often: All fresh, canned (in natural juice), or frozen fruits. Dried fruits without added sugar. One hundred percent fruit juice (½ cup [237 mL] daily).  · Eat Less Often: Dried fruits with added sugar. Canned fruit in light or heavy syrup.  Lean Meats, Fish, and Poultry (2  servings or less daily. One serving is 3 to 4 oz [85-114 g]).  · Eat More Often: Ninety percent or leaner ground beef, tenderloin, sirloin. Round cuts of beef, chicken breast, turkey breast. All fish. Grill, bake, or broil your meat. Nothing should be fried.  · Eat Less Often/Avoid: Fatty cuts of meat, turkey, or chicken leg, thigh, or wing. Fried cuts of meat or fish.  Dairy (2 to 3 servings)  · Eat More Often: Low-fat or fat-free milk, low-fat plain or light yogurt, reduced-fat or part-skim cheese.  · Eat Less Often/Avoid: Milk (whole, 2%). Whole milk yogurt. Full-fat cheeses.  Nuts, Seeds, and Legumes (4 to 5 servings per week)  · Eat More Often: All without added salt.  · Eat Less Often/Avoid: Salted nuts and seeds, canned beans with added salt.  Fats and Sweets (limited)  · Eat More Often: Vegetable oils, tub margarines without trans fats, sugar-free gelatin. Mayonnaise and salad dressings.  · Eat Less Often/Avoid: Coconut oils, palm oils, butter, stick margarine, cream, half and half, cookies, candy, pie.  FOR MORE INFORMATION  The Dash Diet Eating Plan: www.dashdiet.org  Document Released: 11/24/2011 Document Revised: 02/27/2012 Document Reviewed: 11/24/2011  ExitCare® Patient Information ©2014 [employer].

## 2013-07-26 NOTE — Progress Notes (Signed)
  Subjective:    Patient ID: Christie [name], female    DOB: [birth year], 67 y.o.   MRN: [id number]  HPI Comments: Patient is here for high blood pressures.  She went to the eye doctor on Monday and it was 182/82. Her daughter took it again and it was 104/98 Monday night. Ever since then, she has been having headaches.   No other concerns.   Hypertension This is a chronic problem. The current episode started in the past 7 days. Associated symptoms include headaches. (Lightheaded )   This patient relates her blood pressure being high several different times he relates he has not been following her diet the way she should she also states she's been not exercising her staying active. She denies any high fevers chills sweats nausea vomiting she denies chest pressure tightness pain shortness breath or swelling in the legs she does relate compliance with her medicines.  Patient also has history of diabetes as well. She also relates how her niece had breast cancer and they are doing genetic testing on the whole family including the patient to look for the gene that increases risk of these cancers   Review of Systems  Neurological: Positive for headaches.       Objective:   Physical Exam Lungs are clear hearts regular pulse normal skin warm dry blood pressure taken with a large cuff. 156/84 extremities no edema       Assessment & Plan:  HTN-I. told the patient she really needs to check her blood pressure with a large cuff a regular size cuff will overestimating her blood pressure she is continue current medication but we are increasing the dose to losartan 100 mg/hydrochlorothiazide 25 mg. She is to followup if ongoing troubles she will check blood pressure periodically she will send [place] the readings she will followup here within a few weeks  Her diabetes is slightly worse 7.3 she was encouraged to improve diet exercise and then followup in 3 months to look at this again

## 2013-08-08 ENCOUNTER — Telehealth: Payer: Self-pay | Admitting: Family Medicine

## 2013-08-08 NOTE — Telephone Encounter (Signed)
Rx faxed to [facility]

## 2013-08-08 NOTE — Telephone Encounter (Signed)
Patient needs Rx for diabetic test strips to [facility]. °

## 2013-08-13 ENCOUNTER — Ambulatory Visit (INDEPENDENT_AMBULATORY_CARE_PROVIDER_SITE_OTHER): Payer: Medicare Other | Admitting: Family Medicine

## 2013-08-13 ENCOUNTER — Encounter: Payer: Self-pay | Admitting: Family Medicine

## 2013-08-13 VITALS — BP 162/84 | Ht 64.0 in | Wt 199.6 lb

## 2013-08-13 DIAGNOSIS — I10 Essential (primary) hypertension: Secondary | ICD-10-CM

## 2013-08-13 DIAGNOSIS — Z87442 Personal history of urinary calculi: Secondary | ICD-10-CM | POA: Insufficient documentation

## 2013-08-13 DIAGNOSIS — R748 Abnormal levels of other serum enzymes: Secondary | ICD-10-CM | POA: Insufficient documentation

## 2013-08-13 DIAGNOSIS — E785 Hyperlipidemia, unspecified: Secondary | ICD-10-CM

## 2013-08-13 MED ORDER — AMLODIPINE BESYLATE 5 MG PO TABS: 5.0000 mg | ORAL_TABLET | Freq: Every day | ORAL | Status: DC

## 2013-08-13 MED ORDER — ALPRAZOLAM 0.5 MG PO TABS: 0.5000 mg | ORAL_TABLET | Freq: Every evening | ORAL | Status: DC | PRN

## 2013-08-13 MED ORDER — CITALOPRAM HYDROBROMIDE 20 MG PO TABS: ORAL_TABLET | ORAL | Status: DC

## 2013-08-13 MED ORDER — LOSARTAN POTASSIUM-HCTZ 100-12.5 MG PO TABS: 1.0000 | ORAL_TABLET | Freq: Every day | ORAL | Status: DC

## 2013-08-13 NOTE — Progress Notes (Signed)
  Subjective:    Patient ID: Christie [name], female    DOB: [birth year], 67 y.o.   MRN: [id number]  HPI Here for f/u visit on high blood pressure. Says she doesn't notice any change since her last visit with increasing the dose of her BP med.  She is concerned about having high blood sugar readings, and only noticed the increase when her BP medication dose increased.   No other concerns.     Review of Systems Denies headaches relates some increased urinary frequency no dysuria no fever chills    Objective:   Physical Exam  Lungs are clear hearts regular pulse normal blood pressure is elevated on 2 separate readings      Assessment & Plan:  Type 2 diabetes-hopefully going back to original medicine and will allow sugars to get better may need additional medicines when she follows up for diabetic checkup later this fall  Hypertension-restart losartan HCTZ 100/12.5 also Norvasc 5 mg one daily, get a blood pressure monitor and follow it, parameters recommended 140s or less over 80s or less followup again in 2-3 weeks' time

## 2013-08-27 ENCOUNTER — Encounter: Payer: Self-pay | Admitting: Family Medicine

## 2013-08-27 ENCOUNTER — Ambulatory Visit (INDEPENDENT_AMBULATORY_CARE_PROVIDER_SITE_OTHER): Payer: Medicare Other | Admitting: Family Medicine

## 2013-08-27 VITALS — BP 152/78 | Ht 64.0 in | Wt 199.0 lb

## 2013-08-27 DIAGNOSIS — I10 Essential (primary) hypertension: Secondary | ICD-10-CM

## 2013-08-27 NOTE — Progress Notes (Signed)
  Subjective:    Patient ID: Christie [name], female    DOB: 07/29/1946, 67 y.o.   MRN: [id number]  HPI Patient arrives to follow up on hypertension. Was put on Norvasc 2 weeks ago. Notices her throat a little scratchy  this am She's not been able to check her blood pressure home. She does try to eat a healthy diet does try to do some walking. Has been losing some weight.  Review of Systems Negative for shortness breath or chest pressure.    Objective:   Physical Exam  Lungs are clear hearts regular pulse normal blood pressure recheck 146/72      Assessment & Plan:  A/P HTN decent control I encouraged patient to exercise continue to lose weight and recheck blood pressure again in a few months time along with hemoglobin A1c. Call us if progressive troubles.

## 2013-09-12 ENCOUNTER — Encounter: Payer: Self-pay | Admitting: Nurse Practitioner

## 2013-09-12 ENCOUNTER — Ambulatory Visit (INDEPENDENT_AMBULATORY_CARE_PROVIDER_SITE_OTHER): Payer: Medicare Other | Admitting: Nurse Practitioner

## 2013-09-12 VITALS — BP 140/88 | Temp 98.3°F | Ht 64.0 in | Wt 195.0 lb

## 2013-09-12 DIAGNOSIS — J0100 Acute maxillary sinusitis, unspecified: Secondary | ICD-10-CM

## 2013-09-12 MED ORDER — AMOXICILLIN-POT CLAVULANATE 875-125 MG PO TABS: 1.0000 | ORAL_TABLET | Freq: Two times a day (BID) | ORAL | Status: DC

## 2013-09-12 NOTE — Patient Instructions (Signed)
Nasacort AQ as directed 

## 2013-09-13 ENCOUNTER — Encounter: Payer: Self-pay | Admitting: Nurse Practitioner

## 2013-09-13 NOTE — Progress Notes (Signed)
Subjective:  Presents complaints of sinus symptoms over the past week. No fever. No cough. Clear nasal drainage. No wheezing. No sore throat. Some ear pain. Maxillary area sinus pressure/headache.  Objective:   BP 140/88  Temp(Src) 98.3 F (36.8 C) (Oral)  Ht 5\' 4"  (1.626 m)  Wt 195 lb (88.451 kg)  BMI 33.46 kg/m2 NAD. Alert, oriented. TMs clear effusion, no erythema. Pharynx mildly injected with greenish PND noted. Neck supple with mild soft nontender adenopathy. Lungs clear. Heart regular rate rhythm. Positive tenderness along the maxillary area bilateral to percussion.  Assessment:Maxillary sinusitis, acute  Plan: Meds ordered this encounter  Medications  . amoxicillin-clavulanate (AUGMENTIN) 875-125 MG per tablet    Sig: Take 1 tablet by mouth 2 (two) times daily.    Dispense:  20 tablet    Refill:  0    Order Specific Question:  Supervising Provider    Answer:  Merlyn Albert [[id number]]   OTC meds as directed for congestion. Call back if worsens or persists.

## 2013-09-16 ENCOUNTER — Ambulatory Visit: Payer: Medicare Other | Admitting: Nurse Practitioner

## 2013-10-08 ENCOUNTER — Other Ambulatory Visit: Payer: Self-pay | Admitting: Family Medicine

## 2013-10-31 ENCOUNTER — Ambulatory Visit (INDEPENDENT_AMBULATORY_CARE_PROVIDER_SITE_OTHER): Payer: Medicare Other | Admitting: Family Medicine

## 2013-10-31 ENCOUNTER — Encounter: Payer: Self-pay | Admitting: Family Medicine

## 2013-10-31 VITALS — BP 138/74 | Ht 62.5 in | Wt 195.0 lb

## 2013-10-31 DIAGNOSIS — Z79899 Other long term (current) drug therapy: Secondary | ICD-10-CM

## 2013-10-31 DIAGNOSIS — E782 Mixed hyperlipidemia: Secondary | ICD-10-CM

## 2013-10-31 DIAGNOSIS — E119 Type 2 diabetes mellitus without complications: Secondary | ICD-10-CM

## 2013-10-31 DIAGNOSIS — Z23 Encounter for immunization: Secondary | ICD-10-CM

## 2013-10-31 MED ORDER — LOSARTAN POTASSIUM-HCTZ 100-12.5 MG PO TABS: 1.0000 | ORAL_TABLET | Freq: Every day | ORAL | Status: DC

## 2013-10-31 NOTE — Progress Notes (Signed)
  Subjective:    Patient ID: Christie [name], female    DOB: 14-Nov-1946, 67 y.o.   MRN: [id number]  HPIHere for a med check.   Concerns about HTN. Having headaches. Patient with sinus like headaches crossed in front of her head and no fevers vomiting diarrhea sweats chills.  A1C today 6.7. Brought a list of blood sugar readings in today. Readings were reviewed. Long discussion held regarding diabetes education the importance of eating sugars under control for lifetime.  Flu vaccine given today.   Had pneumonia vaccine 09/09/2009. Does she need another vaccine    Review of Systems denies headaches chest pain shortness breath nausea vomiting diarrhea     Objective:   Physical Exam  Lungs clear hearts regular pulse normal abdomen soft extremities no edema skin warm dry foot exam normal      Assessment & Plan:  #1 diabetes good control continue current measures #2 HTN improved walking continue try to lose weight continue medication #3 patient is due for her colonoscopy and information given so she can help set up #4 followup by spring

## 2013-11-08 ENCOUNTER — Other Ambulatory Visit: Payer: Self-pay | Admitting: Family Medicine

## 2013-11-08 LAB — HEPATIC FUNCTION PANEL
ALT: 18 U/L (ref 0–35)
Albumin: 4 g/dL (ref 3.5–5.2)
Indirect Bilirubin: 0.3 mg/dL (ref 0.0–0.9)
Total Protein: 6.4 g/dL (ref 6.0–8.3)

## 2013-11-08 LAB — BASIC METABOLIC PANEL
BUN: 13 mg/dL (ref 6–23)
Chloride: 101 mEq/L (ref 96–112)
Creat: 0.61 mg/dL (ref 0.50–1.10)
Potassium: 4 mEq/L (ref 3.5–5.3)

## 2013-11-08 LAB — LIPID PANEL
Cholesterol: 166 mg/dL (ref 0–200)
HDL: 59 mg/dL (ref >39)
LDL Cholesterol: 85 mg/dL (ref 0–99)
Triglycerides: 108 mg/dL (ref <150)
VLDL: 22 mg/dL (ref 0–40)

## 2013-11-09 LAB — MICROALBUMIN, URINE: Microalb, Ur: 1.58 mg/dL (ref 0.00–1.89)

## 2013-11-10 ENCOUNTER — Encounter: Payer: Self-pay | Admitting: Family Medicine

## 2013-11-13 ENCOUNTER — Other Ambulatory Visit: Payer: Self-pay | Admitting: Family Medicine

## 2013-11-25 ENCOUNTER — Ambulatory Visit (INDEPENDENT_AMBULATORY_CARE_PROVIDER_SITE_OTHER): Payer: Medicare Other | Admitting: Ophthalmology

## 2013-11-25 DIAGNOSIS — H43819 Vitreous degeneration, unspecified eye: Secondary | ICD-10-CM

## 2013-11-25 DIAGNOSIS — E1139 Type 2 diabetes mellitus with other diabetic ophthalmic complication: Secondary | ICD-10-CM

## 2013-11-25 DIAGNOSIS — H2510 Age-related nuclear cataract, unspecified eye: Secondary | ICD-10-CM

## 2013-11-25 DIAGNOSIS — I10 Essential (primary) hypertension: Secondary | ICD-10-CM

## 2013-11-25 DIAGNOSIS — H26499 Other secondary cataract, unspecified eye: Secondary | ICD-10-CM

## 2013-11-25 DIAGNOSIS — H35349 Macular cyst, hole, or pseudohole, unspecified eye: Secondary | ICD-10-CM

## 2013-11-25 DIAGNOSIS — E11319 Type 2 diabetes mellitus with unspecified diabetic retinopathy without macular edema: Secondary | ICD-10-CM

## 2013-11-25 DIAGNOSIS — H35039 Hypertensive retinopathy, unspecified eye: Secondary | ICD-10-CM

## 2013-12-18 ENCOUNTER — Encounter (INDEPENDENT_AMBULATORY_CARE_PROVIDER_SITE_OTHER): Payer: Self-pay | Admitting: *Deleted

## 2013-12-18 ENCOUNTER — Other Ambulatory Visit (INDEPENDENT_AMBULATORY_CARE_PROVIDER_SITE_OTHER): Payer: Self-pay | Admitting: *Deleted

## 2013-12-18 DIAGNOSIS — Z1211 Encounter for screening for malignant neoplasm of colon: Secondary | ICD-10-CM

## 2013-12-23 ENCOUNTER — Telehealth (INDEPENDENT_AMBULATORY_CARE_PROVIDER_SITE_OTHER): Payer: Self-pay | Admitting: *Deleted

## 2013-12-23 DIAGNOSIS — Z1211 Encounter for screening for malignant neoplasm of colon: Secondary | ICD-10-CM

## 2013-12-23 NOTE — Telephone Encounter (Signed)
Patient needs movi prep 

## 2013-12-24 MED ORDER — PEG-KCL-NACL-NASULF-NA ASC-C 100 G PO SOLR: 1.0000 | Freq: Once | ORAL | Status: DC

## 2013-12-26 ENCOUNTER — Encounter (INDEPENDENT_AMBULATORY_CARE_PROVIDER_SITE_OTHER): Payer: Medicare Other | Admitting: Ophthalmology

## 2013-12-26 DIAGNOSIS — H2700 Aphakia, unspecified eye: Secondary | ICD-10-CM

## 2014-01-16 ENCOUNTER — Telehealth (INDEPENDENT_AMBULATORY_CARE_PROVIDER_SITE_OTHER): Payer: Self-pay | Admitting: *Deleted

## 2014-01-16 NOTE — Telephone Encounter (Signed)
agree

## 2014-01-16 NOTE — Telephone Encounter (Signed)
  Procedure: tcs  Reason/Indication:  screening  Has patient had this procedure before?  Yes, 2005 -- epic  If so, when, by whom and where?    Is there a family history of colon cancer?  no  Who?  What age when diagnosed?    Is patient diabetic?   yes      Does patient have prosthetic heart valve?  no  Do you have a pacemaker?  no  Has patient ever had endocarditis? no  Has patient had joint replacement within last 12 months?  no  Does patient tend to be constipated or take laxatives? no  Is patient on Coumadin, Plavix and/or Aspirin? no  Medications: amlodipine 5 mg daily, losartan/hctz 100/12.5 mg daily, glipizide 5 mg bid (am & pm), colon health probiotic daily, macular degeneration supplement daily, centrum sivler daily, vit d3 daily, crestor 10 mg daily, citalopram 20 mg daily, fish oil 1200 mg daily, alprazolam 0.5 mg daily  Allergies: percocet  Medication Adjustment: hold evening dose of glipizide on 2/25 and morning of procedure  Procedure date & time: 02/13/14 at 930

## 2014-01-31 ENCOUNTER — Encounter (HOSPITAL_COMMUNITY): Payer: Self-pay | Admitting: Pharmacy Technician

## 2014-02-05 ENCOUNTER — Other Ambulatory Visit: Payer: Self-pay | Admitting: Family Medicine

## 2014-02-06 ENCOUNTER — Encounter: Payer: Self-pay | Admitting: Family Medicine

## 2014-02-13 ENCOUNTER — Encounter (HOSPITAL_COMMUNITY)
Admission: RE | Disposition: A | Discharge status: Home / Self Care | Payer: Self-pay | Source: Ambulatory Visit | Attending: Internal Medicine

## 2014-02-13 ENCOUNTER — Ambulatory Visit (HOSPITAL_COMMUNITY)
Admission: RE | Admit: 2014-02-13 | Discharge: 2014-02-13 | Disposition: A | Discharge status: Home / Self Care | Payer: Medicare Other | Source: Ambulatory Visit | Attending: Internal Medicine | Admitting: Internal Medicine

## 2014-02-13 ENCOUNTER — Encounter (HOSPITAL_COMMUNITY): Payer: Self-pay | Admitting: *Deleted

## 2014-02-13 DIAGNOSIS — E785 Hyperlipidemia, unspecified: Secondary | ICD-10-CM | POA: Insufficient documentation

## 2014-02-13 DIAGNOSIS — Z1211 Encounter for screening for malignant neoplasm of colon: Secondary | ICD-10-CM

## 2014-02-13 DIAGNOSIS — I129 Hypertensive chronic kidney disease with stage 1 through stage 4 chronic kidney disease, or unspecified chronic kidney disease: Secondary | ICD-10-CM | POA: Insufficient documentation

## 2014-02-13 DIAGNOSIS — E119 Type 2 diabetes mellitus without complications: Secondary | ICD-10-CM | POA: Insufficient documentation

## 2014-02-13 DIAGNOSIS — Z79899 Other long term (current) drug therapy: Secondary | ICD-10-CM | POA: Insufficient documentation

## 2014-02-13 DIAGNOSIS — K6389 Other specified diseases of intestine: Secondary | ICD-10-CM

## 2014-02-13 DIAGNOSIS — K644 Residual hemorrhoidal skin tags: Secondary | ICD-10-CM | POA: Insufficient documentation

## 2014-02-13 DIAGNOSIS — N189 Chronic kidney disease, unspecified: Secondary | ICD-10-CM | POA: Insufficient documentation

## 2014-02-13 HISTORY — PX: COLONOSCOPY: SHX5424

## 2014-02-13 LAB — GLUCOSE, CAPILLARY: Glucose-Capillary: 140 mg/dL — ABNORMAL HIGH (ref 70–99)

## 2014-02-13 SURGERY — COLONOSCOPY
Anesthesia: Moderate Sedation

## 2014-02-13 MED ORDER — MIDAZOLAM HCL 5 MG/5ML IJ SOLN
INTRAMUSCULAR | Status: DC | PRN
  Administered 2014-02-13 (×3): 2 mg via INTRAVENOUS

## 2014-02-13 MED ORDER — SODIUM CHLORIDE 0.9 % IV SOLN
INTRAVENOUS | Status: DC
  Administered 2014-02-13: 12:00:00 via INTRAVENOUS

## 2014-02-13 MED ORDER — MEPERIDINE HCL 50 MG/ML IJ SOLN
INTRAMUSCULAR | Status: AC
  Filled 2014-02-13: qty 1

## 2014-02-13 MED ORDER — MEPERIDINE HCL 50 MG/ML IJ SOLN
INTRAMUSCULAR | Status: DC | PRN
  Administered 2014-02-13 (×2): 25 mg via INTRAVENOUS

## 2014-02-13 MED ORDER — MIDAZOLAM HCL 5 MG/5ML IJ SOLN
INTRAMUSCULAR | Status: AC
  Filled 2014-02-13: qty 10

## 2014-02-13 MED ORDER — SIMETHICONE 40 MG/0.6ML PO SUSP
ORAL | Status: DC | PRN
  Administered 2014-02-13: 13:00:00

## 2014-02-13 NOTE — Discharge Instructions (Signed)
Resume usual medications and diet. °No driving for 24 hours. °Next screening exam in 10 years. °Colonoscopy, Care After °Refer to this sheet in the next few weeks. These instructions provide you with information on caring for yourself after your procedure. Your health care provider may also give you more specific instructions. Your treatment has been planned according to current medical practices, but problems sometimes occur. Call your health care provider if you have any problems or questions after your procedure. °WHAT TO EXPECT AFTER THE PROCEDURE  °After your procedure, it is typical to have the following: °· A small amount of blood in your stool. °· Moderate amounts of gas and mild abdominal cramping or bloating. °HOME CARE INSTRUCTIONS °· Do not drive, operate machinery, or sign important documents for 24 hours. °· You may shower and resume your regular physical activities, but move at a slower pace for the first 24 hours. °· Take frequent rest periods for the first 24 hours. °· Walk around or put a warm pack on your abdomen to help reduce abdominal cramping and bloating. °· Drink enough fluids to keep your urine clear or pale yellow. °· You may resume your normal diet as instructed by your health care provider. Avoid heavy or fried foods that are hard to digest. °· Avoid drinking alcohol for 24 hours or as instructed by your health care provider. °· Only take over-the-counter or prescription medicines as directed by your health care provider. °· If a tissue sample (biopsy) was taken during your procedure: °· Do not take aspirin or blood thinners for 7 days, or as instructed by your health care provider. °· Do not drink alcohol for 7 days, or as instructed by your health care provider. °· Eat soft foods for the first 24 hours. °SEEK MEDICAL CARE IF: °You have persistent spotting of blood in your stool 2 3 days after the procedure. °SEEK IMMEDIATE MEDICAL CARE IF: °· You have more than a small spotting of  blood in your stool. °· You pass large blood clots in your stool. °· Your abdomen is swollen (distended). °· You have nausea or vomiting. °· You have a fever. °· You have increasing abdominal pain that is not relieved with medicine. °Document Released: 07/19/2004 Document Revised: 09/25/2013 Document Reviewed: 08/12/2013 °ExitCare® Patient Information ©2014 [employer]. ° °

## 2014-02-13 NOTE — H&P (Signed)
Christie [name] is an 68 y.o. female.   Chief Complaint: Patient's here for colonoscopy. HPI: Patient is 68 year old Caucasian female who is here for screening colonoscopy. She denies abdominal pain change in bowel habits or rectal bleeding. Patient's last colonoscopy was in February 2005. Family history is negative for CRC.  Past Medical History  Diagnosis Date  . DM (diabetes mellitus) 2012  . Hypertension 2000  . 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 Hyperlipidemia   . Elevated liver enzymes   . Spastic colon   . Chronic kidney disease 11/2008    stones    Past Surgical History  Procedure Laterality Date  . Total abdominal hysterectomy  1982  . Stapedectomy Bilateral     Right 1991 and Left 1994  . Back surgery  2004  . Colonoscopy  feb 2005  . Esophagogastroduodenoscopy  09/2005  . Eye surgery Right     catatract removed  . Eye surgery Right     macular   surgery   . Cholecystectomy      Family History  Problem Relation Age of Onset  . Diabetes Father    Social History:  reports that she has never smoked. She does not have any smokeless tobacco history on file. Her alcohol and drug histories are not on file.  Allergies:  Allergies  Allergen Reactions  . Ace Inhibitors   . Lipitor [Atorvastatin]     Liver enzymes  . Metformin And Related     Side effects  . Percocet [Oxycodone-Acetaminophen]     Pt does not want any percoce    Medications Prior to Admission  Medication Sig Dispense Refill  . ALPRAZolam (XANAX) 0.5 MG tablet Take 1 tablet (0.5 mg total) by mouth at bedtime as needed for sleep. qhs  30 tablet  4  . amLODipine (NORVASC) 5 MG tablet TAKE ONE TABLET BY MOUTH ONCE DAILY.  30 tablet  3  . cholecalciferol (VITAMIN D) 1000 UNITS tablet Take 1,000 Units by mouth daily.      . citalopram (CELEXA) 20 MG tablet TAKE ONE TABLET DAILY.  30 tablet  6  . CRESTOR 10 MG tablet TAKE ONE TABLET DAILY.  30 tablet  2  . glipiZIDE (GLUCOTROL) 5 MG tablet TAKE (1)  TABLET BY MOUTH TWICE DAILY.  60 tablet  3  . losartan-hydrochlorothiazide (HYZAAR) 100-12.5 MG per tablet Take 1 tablet by mouth daily.  30 tablet  12  . Multiple Vitamins-Minerals (CENTRUM SILVER PO) Take by mouth.      . Multiple Vitamins-Minerals (MACULAR VITAMIN BENEFIT PO) Take 1 tablet by mouth daily.      . Omega-3 Fatty Acids (FISH OIL) 1200 MG CAPS Take 1,200 mg by mouth at bedtime.      Marland Kitchen omeprazole (PRILOSEC) 20 MG capsule Take 20 mg by mouth daily as needed (FOR ACID REFLUX).      . peg 3350 powder (MOVIPREP) 100 G SOLR Take 1 kit (200 g total) by mouth once.  1 kit  0  . Probiotic Product (HEALTHY COLON PO) Take by mouth. One qam      . ACCU-CHEK AVIVA PLUS test strip       . ACCU-CHEK SOFTCLIX LANCETS lancets         Results for orders placed during the hospital encounter of 02/13/14 (from the past 48 hour(s))  GLUCOSE, CAPILLARY     Status: Abnormal   Collection Time    02/13/14 12:32 PM      Result  Value Ref Range   Glucose-Capillary 140 (*) 70 - 99 mg/dL   No results found.  ROS  Blood pressure 156/63, pulse 77, temperature 98.6 F (37 C), temperature source Oral, resp. rate 18, height 5' 3.5" (1.613 m), weight 188 lb (85.276 kg), SpO2 96.00%. Physical Exam  Constitutional: She appears well-developed and well-nourished.  HENT:  Mouth/Throat: Oropharynx is clear and moist.  Eyes: Conjunctivae are normal. No scleral icterus.  Neck: No thyromegaly present.  Cardiovascular: Normal rate, regular rhythm and normal heart sounds.   Murmur: faint SEM at LLSB. Respiratory: Effort normal and breath sounds normal.  GI: Soft. She exhibits no distension and no mass. There is no tenderness.  Lymphadenopathy:    She has no cervical adenopathy.  Neurological: She is alert.  Skin: Skin is warm and dry.     Assessment/Plan Average risk screening colonoscopy.  REHMAN,NAJEEB U 02/13/2014, 12:46 PM

## 2014-02-13 NOTE — Op Note (Signed)
COLONOSCOPY PROCEDURE REPORT  PATIENT:  Christie [name]  MR#:  [id number] Birthdate:  [birth year], 68 y.o., female Endoscopist:  Dr. Rogene Houston, MD Referred By:  Dr. Sallee Lange, MD Procedure Date: 02/13/2014  Procedure:   Colonoscopy  Indications:  Patient is 68 year old Caucasian female was undergoing average risk screening colonoscopy. He since last exam was 10 years ago.  Informed Consent:  The procedure and risks were reviewed with the patient and informed consent was obtained.  Medications:  Demerol 50 mg IV Versed 6 mg IV  Description of procedure:  After a digital rectal exam was performed, that colonoscope was advanced from the anus through the rectum and colon to the area of the cecum, ileocecal valve and appendiceal orifice. The cecum was deeply intubated. These structures were well-seen and photographed for the record. From the level of the cecum and ileocecal valve, the scope was slowly and cautiously withdrawn. The mucosal surfaces were carefully surveyed utilizing scope tip to flexion to facilitate fold flattening as needed. The scope was pulled down into the rectum where a thorough exam including retroflexion was performed.  Findings:   Prep excellent. Normal mucosa colon and rectum. Single anal papilla and small hemorrhoids below the dentate line.   Therapeutic/Diagnostic Maneuvers Performed:   None  Complications:  None  Cecal Withdrawal Time:  9 minutes  Impression:  Normal colonoscopy except single small anal papilla and external hemorrhoids.  Recommendations:  Standard instructions given. Next screening exam in 10 years.  Eustace Hur U  02/13/2014 1:32 PM  CC: Dr. Sallee Lange, MD & Dr. Rayne Du ref. provider found

## 2014-02-17 ENCOUNTER — Encounter (HOSPITAL_COMMUNITY): Payer: Self-pay | Admitting: Internal Medicine

## 2014-02-24 ENCOUNTER — Other Ambulatory Visit: Payer: Self-pay | Admitting: Family Medicine

## 2014-02-24 NOTE — Telephone Encounter (Signed)
Seen 10/31/13

## 2014-02-25 ENCOUNTER — Other Ambulatory Visit: Payer: Self-pay | Admitting: Family Medicine

## 2014-02-27 ENCOUNTER — Telehealth: Payer: Self-pay | Admitting: Family Medicine

## 2014-02-27 NOTE — Telephone Encounter (Signed)
Medication called into pharmacy. Patient was notified.

## 2014-02-27 NOTE — Telephone Encounter (Signed)
Patient said that [facility] sent [place] something over requesting more info on the alprazolam Rx that she took to them, but they have not heard anything back from our office. Please advise. She would like a phone call back.

## 2014-02-27 NOTE — Telephone Encounter (Signed)
Refill this +4 additional refills 

## 2014-03-06 ENCOUNTER — Other Ambulatory Visit: Payer: Self-pay | Admitting: Family Medicine

## 2014-03-17 ENCOUNTER — Other Ambulatory Visit: Payer: Self-pay | Admitting: *Deleted

## 2014-03-17 ENCOUNTER — Telehealth: Payer: Self-pay | Admitting: Family Medicine

## 2014-03-17 DIAGNOSIS — R748 Abnormal levels of other serum enzymes: Secondary | ICD-10-CM

## 2014-03-17 DIAGNOSIS — E785 Hyperlipidemia, unspecified: Secondary | ICD-10-CM

## 2014-03-17 DIAGNOSIS — E119 Type 2 diabetes mellitus without complications: Secondary | ICD-10-CM

## 2014-03-17 NOTE — Telephone Encounter (Signed)
Lip liv A1c 

## 2014-03-17 NOTE — Telephone Encounter (Signed)
11/08/13 -  Labs: Lip, liv, met7, urine micro protein, A1C

## 2014-03-17 NOTE — Telephone Encounter (Signed)
Patient calling to see if she needs blood work ordered for her appointment mid April?

## 2014-03-17 NOTE — Telephone Encounter (Signed)
Notified patient via VM stating blood work orders are in. 

## 2014-04-01 LAB — LIPID PANEL
Cholesterol: 177 mg/dL (ref 0–200)
HDL: 61 mg/dL (ref >39)
LDL CALC: 92 mg/dL (ref 0–99)
Total CHOL/HDL Ratio: 2.9 Ratio
Triglycerides: 122 mg/dL (ref <150)
VLDL: 24 mg/dL (ref 0–40)

## 2014-04-01 LAB — HEPATIC FUNCTION PANEL
ALBUMIN: 4.1 g/dL (ref 3.5–5.2)
ALT: 18 U/L (ref 0–35)
AST: 16 U/L (ref 0–37)
Alkaline Phosphatase: 91 U/L (ref 39–117)
BILIRUBIN INDIRECT: 0.3 mg/dL (ref 0.2–1.2)
Bilirubin, Direct: 0.1 mg/dL (ref 0.0–0.3)
TOTAL PROTEIN: 6.7 g/dL (ref 6.0–8.3)
Total Bilirubin: 0.4 mg/dL (ref 0.2–1.2)

## 2014-04-01 LAB — HEMOGLOBIN A1C
HEMOGLOBIN A1C: 7.5 % — AB (ref <5.7)
Mean Plasma Glucose: 169 mg/dL — ABNORMAL HIGH (ref <117)

## 2014-04-04 ENCOUNTER — Ambulatory Visit (INDEPENDENT_AMBULATORY_CARE_PROVIDER_SITE_OTHER): Payer: Medicare Other | Admitting: Family Medicine

## 2014-04-04 ENCOUNTER — Encounter: Payer: Self-pay | Admitting: Family Medicine

## 2014-04-04 VITALS — BP 130/74 | Ht 62.5 in | Wt 197.2 lb

## 2014-04-04 DIAGNOSIS — E119 Type 2 diabetes mellitus without complications: Secondary | ICD-10-CM

## 2014-04-04 DIAGNOSIS — E785 Hyperlipidemia, unspecified: Secondary | ICD-10-CM

## 2014-04-04 DIAGNOSIS — R5383 Other fatigue: Secondary | ICD-10-CM | POA: Insufficient documentation

## 2014-04-04 DIAGNOSIS — I10 Essential (primary) hypertension: Secondary | ICD-10-CM

## 2014-04-04 DIAGNOSIS — F32A Depression, unspecified: Secondary | ICD-10-CM

## 2014-04-04 DIAGNOSIS — R5381 Other malaise: Secondary | ICD-10-CM

## 2014-04-04 DIAGNOSIS — F3289 Other specified depressive episodes: Secondary | ICD-10-CM

## 2014-04-04 DIAGNOSIS — F329 Major depressive disorder, single episode, unspecified: Secondary | ICD-10-CM

## 2014-04-04 MED ORDER — ALPRAZOLAM 0.5 MG PO TABS: ORAL_TABLET | ORAL | Status: DC

## 2014-04-04 MED ORDER — CITALOPRAM HYDROBROMIDE 20 MG PO TABS: ORAL_TABLET | ORAL | Status: DC

## 2014-04-04 MED ORDER — ROSUVASTATIN CALCIUM 20 MG PO TABS: 20.0000 mg | ORAL_TABLET | Freq: Every day | ORAL | Status: DC

## 2014-04-04 MED ORDER — ROSUVASTATIN CALCIUM 10 MG PO TABS: ORAL_TABLET | ORAL | Status: DC

## 2014-04-04 NOTE — Progress Notes (Signed)
   Subjective:    Patient ID: Christie [name], female    DOB: January 23, 1946, 68 y.o.   MRN: [id number]  Diabetes She presents for her follow-up diabetic visit. She has type 2 diabetes mellitus. Her disease course has been stable. There are no hypoglycemic associated symptoms. Pertinent negatives for hypoglycemia include no confusion. There are no diabetic associated symptoms. Pertinent negatives for diabetes include no chest pain, no fatigue, no polydipsia, no polyphagia and no weakness. There are no hypoglycemic complications. Symptoms are stable. There are no diabetic complications. There are no known risk factors for coronary artery disease. Current diabetic treatment includes oral agent (monotherapy). She is compliant with treatment all of the time.  Patient states that she has been having bilateral leg pain and fatigue for about 1 month. Patient states that her ears feel full also.  Patient relates daytime tiredness sleepiness she states she sleeps good at night at times gets depressed about the passing of her husband but she relates this time moves forward she tries to handle that as best she can. She denies any significant depression just has decreased moods at times  She does state she has not been doing a good job with watching her diet or exercising.   Review of Systems  Constitutional: Negative for activity change, appetite change and fatigue.  Respiratory: Negative for cough and shortness of breath.   Cardiovascular: Negative for chest pain.  Gastrointestinal: Negative for abdominal pain.  Endocrine: Negative for polydipsia and polyphagia.  Genitourinary: Negative for frequency.  Neurological: Negative for weakness.  Psychiatric/Behavioral: Negative for confusion.       Objective:   Physical Exam  Vitals reviewed. Constitutional: She appears well-nourished. No distress.  Cardiovascular: Normal rate, regular rhythm and normal heart sounds.   No murmur heard. Pulmonary/Chest: Effort  normal and breath sounds normal. No respiratory distress.  Musculoskeletal: She exhibits no edema.  Lymphadenopathy:    She has no cervical adenopathy.  Neurological: She is alert. She exhibits normal muscle tone.  Psychiatric: Her behavior is normal.          Assessment & Plan:  #1 diabetes-subpar control patient wants to try very hard at diet and taking her medicine rechecking A1c again in 3 months time if she is not at goal at that point she will need to be on additional medications.  #2 hyperlipidemia subpar control increase Crestor I doubt that is what is causing her leg pain. The patient was told that if it creates any leg pain she is to let us know  #3 hypertension good control  #4 fatigue tiredness could be sleep apnea. [place] questionnaire ranked out at high-risk of sleep apnea. Will set up sleep study. Await the results  She will followup in July or August. I recommend that she get lab work at that time -hemoglobin A1c, lipid, liver.

## 2014-05-13 ENCOUNTER — Other Ambulatory Visit: Payer: Self-pay | Admitting: Family Medicine

## 2014-07-04 ENCOUNTER — Other Ambulatory Visit: Payer: Self-pay | Admitting: Family Medicine

## 2014-07-11 ENCOUNTER — Other Ambulatory Visit: Payer: Self-pay | Admitting: Family Medicine

## 2014-08-06 ENCOUNTER — Telehealth: Payer: Self-pay | Admitting: Family Medicine

## 2014-08-06 NOTE — Telephone Encounter (Signed)
Dr Gaylyn Rong, oncology specialist, needs a copy of patients pap results. Patient said that she had this done in December 2014, but I do not see in the computer. Please advise.   Fax to [phone]

## 2014-08-06 NOTE — Telephone Encounter (Signed)
Patient will be calling to schedule a PAP with HPV testing.

## 2014-08-08 ENCOUNTER — Encounter: Payer: Self-pay | Admitting: Family Medicine

## 2014-08-19 LAB — HM DIABETES EYE EXAM

## 2014-09-09 ENCOUNTER — Telehealth: Payer: Self-pay | Admitting: Family Medicine

## 2014-09-09 DIAGNOSIS — E782 Mixed hyperlipidemia: Secondary | ICD-10-CM

## 2014-09-09 DIAGNOSIS — E119 Type 2 diabetes mellitus without complications: Secondary | ICD-10-CM

## 2014-09-09 DIAGNOSIS — Z79899 Other long term (current) drug therapy: Secondary | ICD-10-CM

## 2014-09-09 NOTE — Telephone Encounter (Signed)
04/01/14 lipid, liver, alc

## 2014-09-09 NOTE — Telephone Encounter (Signed)
Pt requesting lab orders, was supposed to follow up July or August per Dr. Nicki Reaper, has appt here 09/29/14, please let pt know when orders are ready

## 2014-09-09 NOTE — Telephone Encounter (Signed)
Left message on voicemail notifying patient that blood work has been ordered.  

## 2014-09-09 NOTE — Telephone Encounter (Signed)
Lipid, liver, metabolic 7, hemoglobin E7Q, urine marker pretty, keep appointment

## 2014-09-11 ENCOUNTER — Other Ambulatory Visit: Payer: Self-pay | Admitting: Family Medicine

## 2014-09-23 LAB — HEPATIC FUNCTION PANEL
ALK PHOS: 134 U/L — AB (ref 39–117)
ALT: 32 U/L (ref 0–35)
AST: 30 U/L (ref 0–37)
Albumin: 4.1 g/dL (ref 3.5–5.2)
BILIRUBIN DIRECT: 0.1 mg/dL (ref 0.0–0.3)
BILIRUBIN INDIRECT: 0.5 mg/dL (ref 0.2–1.2)
BILIRUBIN TOTAL: 0.6 mg/dL (ref 0.2–1.2)
Total Protein: 6.4 g/dL (ref 6.0–8.3)

## 2014-09-23 LAB — HEMOGLOBIN A1C
Hgb A1c MFr Bld: 8.5 % — ABNORMAL HIGH (ref <5.7)
Mean Plasma Glucose: 197 mg/dL — ABNORMAL HIGH (ref <117)

## 2014-09-23 LAB — BASIC METABOLIC PANEL
BUN: 15 mg/dL (ref 6–23)
CALCIUM: 9.1 mg/dL (ref 8.4–10.5)
CO2: 29 meq/L (ref 19–32)
Chloride: 101 mEq/L (ref 96–112)
Creat: 0.64 mg/dL (ref 0.50–1.10)
GLUCOSE: 178 mg/dL — AB (ref 70–99)
Potassium: 4 mEq/L (ref 3.5–5.3)
Sodium: 139 mEq/L (ref 135–145)

## 2014-09-23 LAB — LIPID PANEL
CHOLESTEROL: 150 mg/dL (ref 0–200)
HDL: 55 mg/dL (ref >39)
LDL Cholesterol: 69 mg/dL (ref 0–99)
Total CHOL/HDL Ratio: 2.7 Ratio
Triglycerides: 128 mg/dL (ref <150)
VLDL: 26 mg/dL (ref 0–40)

## 2014-09-23 LAB — MICROALBUMIN, URINE: Microalb, Ur: 1.4 mg/dL (ref <2.0)

## 2014-09-29 ENCOUNTER — Ambulatory Visit (INDEPENDENT_AMBULATORY_CARE_PROVIDER_SITE_OTHER): Payer: Medicare Other | Admitting: Family Medicine

## 2014-09-29 ENCOUNTER — Encounter: Payer: Self-pay | Admitting: Family Medicine

## 2014-09-29 VITALS — BP 132/74 | Ht 62.5 in | Wt 196.0 lb

## 2014-09-29 DIAGNOSIS — E1165 Type 2 diabetes mellitus with hyperglycemia: Secondary | ICD-10-CM | POA: Insufficient documentation

## 2014-09-29 DIAGNOSIS — E785 Hyperlipidemia, unspecified: Secondary | ICD-10-CM

## 2014-09-29 DIAGNOSIS — G4700 Insomnia, unspecified: Secondary | ICD-10-CM | POA: Insufficient documentation

## 2014-09-29 DIAGNOSIS — I10 Essential (primary) hypertension: Secondary | ICD-10-CM

## 2014-09-29 DIAGNOSIS — Z23 Encounter for immunization: Secondary | ICD-10-CM

## 2014-09-29 DIAGNOSIS — E119 Type 2 diabetes mellitus without complications: Secondary | ICD-10-CM

## 2014-09-29 MED ORDER — CITALOPRAM HYDROBROMIDE 20 MG PO TABS: ORAL_TABLET | ORAL | Status: DC

## 2014-09-29 MED ORDER — AMLODIPINE BESYLATE 5 MG PO TABS: ORAL_TABLET | ORAL | Status: DC

## 2014-09-29 MED ORDER — LOSARTAN POTASSIUM-HCTZ 100-12.5 MG PO TABS: 1.0000 | ORAL_TABLET | Freq: Every day | ORAL | Status: DC

## 2014-09-29 MED ORDER — ROSUVASTATIN CALCIUM 20 MG PO TABS: 20.0000 mg | ORAL_TABLET | Freq: Every day | ORAL | Status: DC

## 2014-09-29 MED ORDER — ALPRAZOLAM 0.5 MG PO TABS: ORAL_TABLET | ORAL | Status: DC

## 2014-09-29 MED ORDER — GLIPIZIDE 5 MG PO TABS: ORAL_TABLET | ORAL | Status: DC

## 2014-09-29 MED ORDER — OMEPRAZOLE 20 MG PO CPDR: 20.0000 mg | DELAYED_RELEASE_CAPSULE | Freq: Every day | ORAL | Status: DC | PRN

## 2014-09-29 NOTE — Progress Notes (Signed)
   Subjective:    Patient ID: Christie [name], female    DOB: [birth year], 68 y.o.   MRN: [id number]  HPI Patient is here today to discuss lab results & patient would like to get FLU shot today. Patient would also like to discuss taking Prilosec again. Question About Pneumonia shot also   Review of Systems     Objective:   Physical Exam  No masses lungs are clear heart is regular pulse normal blood pressure good abdomen soft extremities no edema diabetic foot exam also was completed      Assessment & Plan:  Reviewed overall her lab work in detail medications were updated 1. Need for vaccination Given today - Pneumococcal conjugate vaccine 13-valent IM  2. Encounter for immunization Vaccine today  3. Essential hypertension, benign Blood pressure under good control she needs to watch her diet exercise try to [place]  4. Poorly controlled type 2 diabetes mellitus Diabetes needs to be in much better control double up on medicines in this readings in a few weeks if not doing well consider adding long-acting insulin  5. Hyperlipidemia Good control continue current measures  6. Insomnia Xanax half tablet at nighttime as necessary  Followup 3 months

## 2014-10-03 ENCOUNTER — Telehealth: Payer: Self-pay | Admitting: Family Medicine

## 2014-10-03 NOTE — Telephone Encounter (Signed)
Patient was seen on 10/12 and she thought she was told to take 2 glipizide twice a day instead one .But her prescription still states one instead of two twice a day. She wants to know which is correct and if 2 twice a day needs new prescription call into  [facility].

## 2014-10-05 NOTE — Telephone Encounter (Signed)
Supposed to be 2 tablets bid, do new script, recheck A1C in 3 months, notify us if any issues or low spells

## 2014-10-06 MED ORDER — GLIPIZIDE 5 MG PO TABS: ORAL_TABLET | ORAL | Status: DC

## 2014-10-06 NOTE — Telephone Encounter (Signed)
Rx sent electronically to pharmacy. Patient notified. 

## 2014-10-06 NOTE — Addendum Note (Signed)
Addended by: Dairl Ponder on: 10/06/2014 09:18 AM   Modules accepted: Orders

## 2014-10-14 ENCOUNTER — Encounter: Payer: Self-pay | Admitting: Family Medicine

## 2014-10-14 DIAGNOSIS — N361 Urethral diverticulum: Secondary | ICD-10-CM | POA: Insufficient documentation

## 2014-10-14 DIAGNOSIS — L900 Lichen sclerosus et atrophicus: Secondary | ICD-10-CM | POA: Insufficient documentation

## 2014-11-19 ENCOUNTER — Encounter: Payer: Self-pay | Admitting: Family Medicine

## 2014-11-19 ENCOUNTER — Ambulatory Visit (INDEPENDENT_AMBULATORY_CARE_PROVIDER_SITE_OTHER): Payer: Medicare Other | Admitting: Family Medicine

## 2014-11-19 VITALS — BP 142/70 | Temp 99.0°F | Ht 63.5 in | Wt 192.0 lb

## 2014-11-19 DIAGNOSIS — J019 Acute sinusitis, unspecified: Secondary | ICD-10-CM

## 2014-11-19 DIAGNOSIS — B9689 Other specified bacterial agents as the cause of diseases classified elsewhere: Secondary | ICD-10-CM

## 2014-11-19 MED ORDER — AMOXICILLIN 500 MG PO TABS: 500.0000 mg | ORAL_TABLET | Freq: Three times a day (TID) | ORAL | Status: DC

## 2014-11-19 NOTE — Progress Notes (Signed)
   Subjective:    Patient ID: Christie [name], female    DOB: 15-Aug-1946, 68 y.o.   MRN: [id number]  Cough This is a new problem. Associated symptoms include rhinorrhea. Pertinent negatives include no chest pain, ear pain, fever, shortness of breath or wheezing. Associated symptoms comments: Congestion, runny nose, headache, tickle in throat, fluid in ears. Treatments tried: advil cold and sinus. The treatment provided mild relief.   PMH diabetes   Review of Systems  Constitutional: Negative for fever and activity change.  HENT: Positive for congestion and rhinorrhea. Negative for ear pain.   Eyes: Negative for discharge.  Respiratory: Positive for cough. Negative for shortness of breath and wheezing.   Cardiovascular: Negative for chest pain.       Objective:   Physical Exam  Constitutional: She appears well-developed.  HENT:  Head: Normocephalic.  Nose: Nose normal.  Mouth/Throat: Oropharynx is clear and moist. No oropharyngeal exudate.  Neck: Neck supple.  Cardiovascular: Normal rate and normal heart sounds.   No murmur heard. Pulmonary/Chest: Effort normal and breath sounds normal. She has no wheezes.  Lymphadenopathy:    She has no cervical adenopathy.  Skin: Skin is warm and dry.  Nursing note and vitals reviewed.  moderate sinus tenderness   Not respiratory distress not toxic     Assessment & Plan:  Viral URI Secondary rhinosinusitis Antibiotics prescribed Warning signs discussed follow-up if ongoing troubles  Follow-up regarding diabetes

## 2014-11-26 ENCOUNTER — Encounter: Payer: Self-pay | Admitting: Family Medicine

## 2014-12-29 ENCOUNTER — Ambulatory Visit (INDEPENDENT_AMBULATORY_CARE_PROVIDER_SITE_OTHER): Payer: Medicare Other | Admitting: Ophthalmology

## 2015-01-01 ENCOUNTER — Ambulatory Visit: Payer: Medicare Other | Admitting: Family Medicine

## 2015-01-02 ENCOUNTER — Telehealth: Payer: Self-pay | Admitting: Family Medicine

## 2015-01-02 NOTE — Telephone Encounter (Signed)
Pt is requesting labs be sent over.  Last labs were: Lipid,hepatic,BMP,A1c,Microalbumin 09/24/15

## 2015-01-05 NOTE — Telephone Encounter (Signed)
Pt only needs A1C per Dr. Richardson Landry. Pt notified that she does not need bloodwork and we will do A1C at office visit.

## 2015-01-08 ENCOUNTER — Ambulatory Visit: Payer: Medicare Other | Admitting: Family Medicine

## 2015-01-13 ENCOUNTER — Ambulatory Visit (INDEPENDENT_AMBULATORY_CARE_PROVIDER_SITE_OTHER): Payer: Medicare Other | Admitting: Family Medicine

## 2015-01-13 ENCOUNTER — Encounter: Payer: Self-pay | Admitting: Family Medicine

## 2015-01-13 ENCOUNTER — Telehealth: Payer: Self-pay | Admitting: Family Medicine

## 2015-01-13 VITALS — BP 134/78 | Ht 63.5 in | Wt 194.0 lb

## 2015-01-13 DIAGNOSIS — I951 Orthostatic hypotension: Secondary | ICD-10-CM

## 2015-01-13 DIAGNOSIS — E1165 Type 2 diabetes mellitus with hyperglycemia: Secondary | ICD-10-CM

## 2015-01-13 DIAGNOSIS — I10 Essential (primary) hypertension: Secondary | ICD-10-CM

## 2015-01-13 DIAGNOSIS — Z79899 Other long term (current) drug therapy: Secondary | ICD-10-CM

## 2015-01-13 DIAGNOSIS — N361 Urethral diverticulum: Secondary | ICD-10-CM

## 2015-01-13 DIAGNOSIS — E785 Hyperlipidemia, unspecified: Secondary | ICD-10-CM

## 2015-01-13 DIAGNOSIS — E119 Type 2 diabetes mellitus without complications: Secondary | ICD-10-CM

## 2015-01-13 LAB — POCT GLYCOSYLATED HEMOGLOBIN (HGB A1C): Hemoglobin A1C: 7.8

## 2015-01-13 MED ORDER — CANAGLIFLOZIN 100 MG PO TABS: 100.0000 mg | ORAL_TABLET | Freq: Every day | ORAL | Status: DC

## 2015-01-13 MED ORDER — GLIPIZIDE 5 MG PO TABS: ORAL_TABLET | ORAL | Status: DC

## 2015-01-13 NOTE — Telephone Encounter (Signed)
Pt states she is taking amlodipine 5mg  (norvasc)  You asked her to look at home, call back to tell you yes or no   Its in her med list here last filled 09/29/14  [facility]

## 2015-01-13 NOTE — Telephone Encounter (Signed)
Patient notified

## 2015-01-13 NOTE — Telephone Encounter (Signed)
The patient wasn't sure if she was taking that I asked her to verify she did. I recommend for her to reduce the medication to a half a tablet per day. Please also change that in Epic

## 2015-01-13 NOTE — Telephone Encounter (Signed)
Some blood work was already ordered this morning on this patient

## 2015-01-13 NOTE — Telephone Encounter (Signed)
Pt being seen in Feb for Phy and then April for med check, was told  To tell me to order her BW papers

## 2015-01-13 NOTE — Progress Notes (Signed)
   Subjective:    Patient ID: Christie [name], female    DOB: 05-May-1946, 69 y.o.   MRN: [id number]  Diabetes She presents for her follow-up diabetic visit. She has type 2 diabetes mellitus. Pertinent negatives for hypoglycemia include no confusion. Pertinent negatives for diabetes include no chest pain, no fatigue, no polydipsia, no polyphagia and no weakness. Current diabetic treatment includes oral agent (monotherapy) (glipizide). She is compliant with treatment all of the time. She rarely participates in exercise. Her breakfast blood glucose range is generally 130-140 mg/dl. She does not see a podiatrist.Eye exam is current.   Pt having some dizziness. Usually at night. Esp when turning over in bed or when getting up in the am. Started about 1 month ago.   Vulvar cancer back in 1999. Pt saw oncology in July or august because she had a cyst. Pt signed a release to send over report back in the summer.    Review of Systems  Constitutional: Negative for activity change, appetite change and fatigue.  Respiratory: Negative for cough and shortness of breath.   Cardiovascular: Negative for chest pain.  Gastrointestinal: Negative for abdominal pain.  Endocrine: Negative for polydipsia and polyphagia.  Genitourinary: Negative for frequency, vaginal bleeding, vaginal discharge and vaginal pain.  Neurological: Negative for weakness.  Psychiatric/Behavioral: Negative for confusion.       Objective:   Physical Exam  Constitutional: She appears well-nourished. No distress.  Cardiovascular: Normal rate, regular rhythm and normal heart sounds.   No murmur heard. Pulmonary/Chest: Effort normal and breath sounds normal. No respiratory distress.  Musculoskeletal: She exhibits no edema.  Lymphadenopathy:    She has no cervical adenopathy.  Neurological: She is alert. She exhibits normal muscle tone.  Psychiatric: Her behavior is normal.  Vitals reviewed.         Assessment & Plan:  1. Type 2  diabetes mellitus without complication Her B8G is not under good control we spent significant amount of time discussing multiple different avenues that she could take to get this under better control we went ahead and decided on adding an additional medication.Invokana side effects and dosage was discuss it was prescribed we will recheck an A1c again in 3 months. - POCT glycosylated hemoglobin (Hb A1C) - Hemoglobin A1c  2. Essential hypertension, benign Blood pressure is under good control but I told her that she may reduce amlodipine to a half a tablet daily. - Basic metabolic panel  3. Poorly controlled type 2 diabetes mellitus Please see discussion above we did talk about diet and exercise.  4. Hyperlipidemia She will need to check this before her next visit we discussed importance of diet and medication. - Lipid panel  5. Urethral diverticulum She will need to have this checked on a wellness exam that'll be coming up in a few weeks  6. High risk medication use We will check liver function - Hepatic function panel  7. Postural hypotension Blood pressure laying sitting standing does show a drop I would recommend reducing the medication Norvasc half a tablet daily Greater than 25 minutes spent with this patient discussing multiple health problems

## 2015-01-13 NOTE — Patient Instructions (Signed)
You have had labs ordered as part of today's visit. These test are necessary for [place] to provide good care. Failure to complete the labs in a timely basis can cause significant risk to your health. It is your responsibility to do the test. These orders are good for 30 days. After 30 days they are cancelled by the system and require new orders. Labs are drawn at [facility], located across from the hospital.Please do your test as soon as possible!! All results are forwarded to you via phone call for urgent findings and via "My Chart" or letter for routine findings.Letters typically take 7 to 10 days for you to receive.If you don't receive results within 2 weeks please call us for the results.  In the future,for chronic visits we encourage you to have labs ordered and completed a few days before your visit so we can have a detailed discussion about the results. Doing labs for chronic health issues ( thyroid,diabetes, cholesterol,etc.) before your visit allows for you to have a more thorough and efficient visit.  Lab orders can be given by our office to you by request.

## 2015-01-13 NOTE — Telephone Encounter (Signed)
The orders were put in the system this am for her to do the labs in April before her visit, please inform the pt

## 2015-01-14 ENCOUNTER — Other Ambulatory Visit: Payer: Self-pay | Admitting: *Deleted

## 2015-01-14 MED ORDER — AMLODIPINE BESYLATE 2.5 MG PO TABS: ORAL_TABLET | ORAL | Status: DC

## 2015-01-14 NOTE — Telephone Encounter (Signed)
Discussed with pt. Med sent to pharm with new dose.

## 2015-01-16 ENCOUNTER — Ambulatory Visit (INDEPENDENT_AMBULATORY_CARE_PROVIDER_SITE_OTHER): Payer: Medicare Other | Admitting: Ophthalmology

## 2015-01-19 ENCOUNTER — Ambulatory Visit (INDEPENDENT_AMBULATORY_CARE_PROVIDER_SITE_OTHER): Payer: Medicare Other | Admitting: Ophthalmology

## 2015-01-19 DIAGNOSIS — I10 Essential (primary) hypertension: Secondary | ICD-10-CM | POA: Diagnosis not present

## 2015-01-19 DIAGNOSIS — E11319 Type 2 diabetes mellitus with unspecified diabetic retinopathy without macular edema: Secondary | ICD-10-CM | POA: Diagnosis not present

## 2015-01-19 DIAGNOSIS — H43813 Vitreous degeneration, bilateral: Secondary | ICD-10-CM

## 2015-01-19 DIAGNOSIS — E11329 Type 2 diabetes mellitus with mild nonproliferative diabetic retinopathy without macular edema: Secondary | ICD-10-CM | POA: Diagnosis not present

## 2015-01-19 DIAGNOSIS — H35341 Macular cyst, hole, or pseudohole, right eye: Secondary | ICD-10-CM

## 2015-01-19 DIAGNOSIS — H35033 Hypertensive retinopathy, bilateral: Secondary | ICD-10-CM

## 2015-01-29 ENCOUNTER — Ambulatory Visit (INDEPENDENT_AMBULATORY_CARE_PROVIDER_SITE_OTHER): Payer: Medicare Other | Admitting: Family Medicine

## 2015-01-29 ENCOUNTER — Encounter: Payer: Self-pay | Admitting: Family Medicine

## 2015-01-29 VITALS — BP 140/82 | Ht 64.0 in | Wt 189.0 lb

## 2015-01-29 DIAGNOSIS — I10 Essential (primary) hypertension: Secondary | ICD-10-CM

## 2015-01-29 DIAGNOSIS — Z79899 Other long term (current) drug therapy: Secondary | ICD-10-CM

## 2015-01-29 DIAGNOSIS — E785 Hyperlipidemia, unspecified: Secondary | ICD-10-CM

## 2015-01-29 DIAGNOSIS — I951 Orthostatic hypotension: Secondary | ICD-10-CM

## 2015-01-29 DIAGNOSIS — R748 Abnormal levels of other serum enzymes: Secondary | ICD-10-CM

## 2015-01-29 DIAGNOSIS — Z0000 Encounter for general adult medical examination without abnormal findings: Secondary | ICD-10-CM

## 2015-01-29 DIAGNOSIS — B9689 Other specified bacterial agents as the cause of diseases classified elsewhere: Secondary | ICD-10-CM

## 2015-01-29 DIAGNOSIS — N760 Acute vaginitis: Secondary | ICD-10-CM

## 2015-01-29 DIAGNOSIS — E1165 Type 2 diabetes mellitus with hyperglycemia: Secondary | ICD-10-CM

## 2015-01-29 DIAGNOSIS — E119 Type 2 diabetes mellitus without complications: Secondary | ICD-10-CM

## 2015-01-29 DIAGNOSIS — A499 Bacterial infection, unspecified: Secondary | ICD-10-CM

## 2015-01-29 LAB — POC HEMOCCULT BLD/STL (OFFICE/1-CARD/DIAGNOSTIC): Fecal Occult Blood, POC: NEGATIVE

## 2015-01-29 MED ORDER — METRONIDAZOLE 500 MG PO TABS: 500.0000 mg | ORAL_TABLET | Freq: Two times a day (BID) | ORAL | Status: DC

## 2015-01-29 NOTE — Progress Notes (Signed)
   Subjective:    Patient ID: Christie [name], female    DOB: 1946/04/19, 69 y.o.   MRN: [id number]  HPI AWV- Annual Wellness Visit  The patient was seen for their annual wellness visit. The patient's past medical history, surgical history, and family history were reviewed. Pertinent vaccines were reviewed ( tetanus, pneumonia, shingles, flu) The patient's medication list was reviewed and updated.  The height and weight were entered. The patient's current BMI is: 32  Cognitive screening was completed. Outcome of Mini - Cog: Pass  Falls within the past 6 months: No  Current tobacco usage: No (All patients who use tobacco were given written and verbal information on quitting)  Recent listing of emergency department/hospitalizations over the past year were reviewed. Current specialist the patient sees on a regular basis: Dr. Zigmund Daniel for eyes and Dr. Risa Grill kidney stones   Medicare annual wellness visit patient questionnaire was reviewed.  A written screening schedule for the patient for the next 5-10 years was given. Appropriate discussion of followup regarding next visit was discussed.       Review of Systems  Constitutional: Negative for activity change, appetite change and fatigue.  HENT: Negative for congestion, ear discharge and rhinorrhea.   Eyes: Negative for discharge.  Respiratory: Negative for cough, chest tightness and wheezing.   Cardiovascular: Negative for chest pain.  Gastrointestinal: Negative for vomiting and abdominal pain.  Genitourinary: Negative for frequency and difficulty urinating.  Musculoskeletal: Negative for neck pain.  Allergic/Immunologic: Negative for environmental allergies and food allergies.  Neurological: Negative for weakness and headaches.  Psychiatric/Behavioral: Negative for behavioral problems and agitation.       Objective:   Physical Exam  Constitutional: She is oriented to person, place, and time. She appears well-developed and  well-nourished.  HENT:  Head: Normocephalic.  Right Ear: External ear normal.  Left Ear: External ear normal.  Eyes: Pupils are equal, round, and reactive to light.  Neck: Normal range of motion. No thyromegaly present.  Cardiovascular: Normal rate, regular rhythm, normal heart sounds and intact distal pulses.   No murmur heard. Pulmonary/Chest: Effort normal and breath sounds normal. No respiratory distress. She has no wheezes.  Abdominal: Soft. Bowel sounds are normal. She exhibits no distension and no mass. There is no tenderness.  Musculoskeletal: Normal range of motion. She exhibits no edema or tenderness.  Lymphadenopathy:    She has no cervical adenopathy.  Neurological: She is alert and oriented to person, place, and time. She exhibits normal muscle tone.  Skin: Skin is warm and dry.  Psychiatric: She has a normal mood and affect. Her behavior is normal.   breast exam normal bilateral  urethral diverticulum noted no sign a cancer Pap smear taken        Assessment & Plan:   safety dietary measures physical activity all as directed. Continue as is. Follow-up diabetic checkup in a couple months. Stop amlodipine because orthostatic hypotension.

## 2015-01-30 LAB — PAP IG W/ RFLX HPV ASCU

## 2015-03-02 ENCOUNTER — Other Ambulatory Visit: Payer: Self-pay | Admitting: Family Medicine

## 2015-04-10 LAB — LIPID PANEL
CHOL/HDL RATIO: 2.5 ratio (ref 0.0–4.4)
CHOLESTEROL TOTAL: 164 mg/dL (ref 100–199)
HDL: 66 mg/dL (ref >39)
LDL Calculated: 76 mg/dL (ref 0–99)
TRIGLYCERIDES: 111 mg/dL (ref 0–149)
VLDL Cholesterol Cal: 22 mg/dL (ref 5–40)

## 2015-04-10 LAB — BASIC METABOLIC PANEL
BUN / CREAT RATIO: 31 — AB (ref 11–26)
BUN: 19 mg/dL (ref 8–27)
CHLORIDE: 100 mmol/L (ref 97–108)
CO2: 27 mmol/L (ref 18–29)
Calcium: 9.8 mg/dL (ref 8.7–10.3)
Creatinine, Ser: 0.62 mg/dL (ref 0.57–1.00)
GFR calc Af Amer: 106 mL/min/{1.73_m2} (ref >59)
GFR, EST NON AFRICAN AMERICAN: 92 mL/min/{1.73_m2} (ref >59)
Glucose: 133 mg/dL — ABNORMAL HIGH (ref 65–99)
POTASSIUM: 4.2 mmol/L (ref 3.5–5.2)
SODIUM: 142 mmol/L (ref 134–144)

## 2015-04-10 LAB — HEPATIC FUNCTION PANEL
ALT: 31 IU/L (ref 0–32)
AST: 25 IU/L (ref 0–40)
Albumin: 4.6 g/dL (ref 3.5–4.8)
Alkaline Phosphatase: 161 IU/L — ABNORMAL HIGH (ref 39–117)
BILIRUBIN TOTAL: 0.6 mg/dL (ref 0.0–1.2)
Bilirubin, Direct: 0.2 mg/dL (ref 0.00–0.40)
Total Protein: 6.6 g/dL (ref 6.0–8.5)

## 2015-04-10 LAB — HEMOGLOBIN A1C
Est. average glucose Bld gHb Est-mCnc: 148 mg/dL
Hgb A1c MFr Bld: 6.8 % — ABNORMAL HIGH (ref 4.8–5.6)

## 2015-04-14 ENCOUNTER — Other Ambulatory Visit: Payer: Self-pay | Admitting: Family Medicine

## 2015-04-14 ENCOUNTER — Ambulatory Visit (INDEPENDENT_AMBULATORY_CARE_PROVIDER_SITE_OTHER): Payer: Medicare Other | Admitting: Family Medicine

## 2015-04-14 ENCOUNTER — Encounter: Payer: Self-pay | Admitting: Family Medicine

## 2015-04-14 VITALS — BP 120/70 | Ht 64.0 in | Wt 181.0 lb

## 2015-04-14 DIAGNOSIS — Z13820 Encounter for screening for osteoporosis: Secondary | ICD-10-CM | POA: Diagnosis not present

## 2015-04-14 DIAGNOSIS — E785 Hyperlipidemia, unspecified: Secondary | ICD-10-CM

## 2015-04-14 DIAGNOSIS — E119 Type 2 diabetes mellitus without complications: Secondary | ICD-10-CM

## 2015-04-14 DIAGNOSIS — R748 Abnormal levels of other serum enzymes: Secondary | ICD-10-CM | POA: Diagnosis not present

## 2015-04-14 DIAGNOSIS — Z139 Encounter for screening, unspecified: Secondary | ICD-10-CM

## 2015-04-14 DIAGNOSIS — D6489 Other specified anemias: Secondary | ICD-10-CM

## 2015-04-14 DIAGNOSIS — I10 Essential (primary) hypertension: Secondary | ICD-10-CM

## 2015-04-14 DIAGNOSIS — K219 Gastro-esophageal reflux disease without esophagitis: Secondary | ICD-10-CM | POA: Diagnosis not present

## 2015-04-14 MED ORDER — ALPRAZOLAM 0.5 MG PO TABS: ORAL_TABLET | ORAL | Status: DC

## 2015-04-14 NOTE — Progress Notes (Signed)
   Subjective:    Patient ID: Christie [name], female    DOB: 08/26/1946, 69 y.o.   MRN: [id number]  Diabetes She presents for her follow-up diabetic visit. She has type 2 diabetes mellitus. Her disease course has been stable. There are no hypoglycemic associated symptoms. Pertinent negatives for hypoglycemia include no confusion. There are no diabetic associated symptoms. Pertinent negatives for diabetes include no chest pain, no fatigue, no polydipsia, no polyphagia and no weakness. There are no hypoglycemic complications. Symptoms are stable. There are no diabetic complications. There are no known risk factors for coronary artery disease. Current diabetic treatment includes oral agent (dual therapy). She is compliant with treatment all of the time.   Patient had blood work done and results are in the system.  No concerns at this time.    Review of Systems  Constitutional: Negative for activity change, appetite change and fatigue.  HENT: Negative for congestion.   Respiratory: Negative for cough.   Cardiovascular: Negative for chest pain.  Gastrointestinal: Negative for abdominal pain.  Endocrine: Negative for polydipsia and polyphagia.  Neurological: Negative for weakness.  Psychiatric/Behavioral: Negative for confusion.       Objective:   Physical Exam  Constitutional: She appears well-nourished. No distress.  Cardiovascular: Normal rate, regular rhythm and normal heart sounds.   No murmur heard. Pulmonary/Chest: Effort normal and breath sounds normal. No respiratory distress.  Musculoskeletal: She exhibits no edema.  Lymphadenopathy:    She has no cervical adenopathy.  Neurological: She is alert. She exhibits normal muscle tone.  Psychiatric: Her behavior is normal.  Vitals reviewed.         Assessment & Plan:  1. Essential hypertension, benign Blood pressure overall doing well continue current measures watch diet continue to lose weight  2. Hyperlipidemia Lipid profile  overall doing well patient taking cholesterol medicine hopefully this is going to go well in the long run monitor every 6 months  3. Gastroesophageal reflux disease without esophagitis Her reflux under good control she is only taking a medicine 2 or 3 times per week without any complications  4. Diabetes mellitus without complication W0J looks great she's lost weight she's taken her medicine doing much better continue current measures  5. Elevated alkaline phosphatase level We need to help figure out what is causing the underlying issues we will repeat the test and do more specific testing to see if it's bone related or liver related - Alkaline phosphatase - Gamma GT  6. Anemia due to other cause She has history of anemia we will be checking CBC await the results - CBC with Differential/Platelet - Gamma GT  7. Screening for osteoporosis She needs a bone density testing - DG Bone Density Should be noted that greater than 25 minutes spent with patient covering these multitude of issues greater than half was spent in discussion and counseling directly of these issues

## 2015-04-15 LAB — CBC WITH DIFFERENTIAL/PLATELET
Basophils Absolute: 0 10*3/uL (ref 0.0–0.2)
Basos: 1 %
EOS (ABSOLUTE): 0.1 10*3/uL (ref 0.0–0.4)
EOS: 2 %
HEMATOCRIT: 47.9 % — AB (ref 34.0–46.6)
Hemoglobin: 15.7 g/dL (ref 11.1–15.9)
Immature Grans (Abs): 0 10*3/uL (ref 0.0–0.1)
Immature Granulocytes: 0 %
Lymphocytes Absolute: 1.7 10*3/uL (ref 0.7–3.1)
Lymphs: 22 %
MCH: 29.7 pg (ref 26.6–33.0)
MCHC: 32.8 g/dL (ref 31.5–35.7)
MCV: 91 fL (ref 79–97)
Monocytes Absolute: 0.5 10*3/uL (ref 0.1–0.9)
Monocytes: 7 %
NEUTROS PCT: 68 %
Neutrophils Absolute: 5.2 10*3/uL (ref 1.4–7.0)
PLATELETS: 193 10*3/uL (ref 150–379)
RBC: 5.28 x10E6/uL (ref 3.77–5.28)
RDW: 13.5 % (ref 12.3–15.4)
WBC: 7.5 10*3/uL (ref 3.4–10.8)

## 2015-04-15 LAB — ALKALINE PHOSPHATASE: ALK PHOS: 172 IU/L — AB (ref 39–117)

## 2015-04-15 LAB — GAMMA GT: GGT: 210 IU/L — ABNORMAL HIGH (ref 0–60)

## 2015-04-17 ENCOUNTER — Ambulatory Visit (HOSPITAL_COMMUNITY)
Admission: RE | Admit: 2015-04-17 | Discharge: 2015-04-17 | Disposition: A | Discharge status: Home / Self Care | Payer: Medicare Other | Source: Ambulatory Visit | Attending: Family Medicine | Admitting: Family Medicine

## 2015-04-17 DIAGNOSIS — R29890 Loss of height: Secondary | ICD-10-CM | POA: Insufficient documentation

## 2015-04-17 DIAGNOSIS — Z780 Asymptomatic menopausal state: Secondary | ICD-10-CM | POA: Diagnosis present

## 2015-04-19 ENCOUNTER — Encounter: Payer: Self-pay | Admitting: Family Medicine

## 2015-05-01 NOTE — Addendum Note (Signed)
Addended by: Carmelina Noun on: 05/01/2015 09:54 AM   Modules accepted: Orders

## 2015-05-07 ENCOUNTER — Ambulatory Visit (HOSPITAL_COMMUNITY)
Admission: RE | Admit: 2015-05-07 | Discharge: 2015-05-07 | Disposition: A | Discharge status: Home / Self Care | Payer: Medicare Other | Source: Ambulatory Visit | Attending: Family Medicine | Admitting: Family Medicine

## 2015-05-07 DIAGNOSIS — R748 Abnormal levels of other serum enzymes: Secondary | ICD-10-CM | POA: Diagnosis present

## 2015-05-12 ENCOUNTER — Other Ambulatory Visit: Payer: Self-pay

## 2015-05-12 DIAGNOSIS — R945 Abnormal results of liver function studies: Principal | ICD-10-CM

## 2015-05-12 DIAGNOSIS — R7989 Other specified abnormal findings of blood chemistry: Secondary | ICD-10-CM

## 2015-05-15 LAB — ALKALINE PHOSPHATASE: Alkaline Phosphatase: 166 IU/L — ABNORMAL HIGH (ref 39–117)

## 2015-05-15 LAB — MITOCHONDRIAL ANTIBODIES: MITOCHONDRIAL AB: 3.6 U (ref 0.0–20.0)

## 2015-05-17 ENCOUNTER — Encounter: Payer: Self-pay | Admitting: Family Medicine

## 2015-05-17 DIAGNOSIS — R748 Abnormal levels of other serum enzymes: Secondary | ICD-10-CM | POA: Insufficient documentation

## 2015-05-25 ENCOUNTER — Encounter: Payer: Self-pay | Admitting: Family Medicine

## 2015-05-25 ENCOUNTER — Ambulatory Visit (INDEPENDENT_AMBULATORY_CARE_PROVIDER_SITE_OTHER): Payer: Medicare Other | Admitting: Family Medicine

## 2015-05-25 VITALS — BP 124/80 | Temp 98.3°F | Ht 64.0 in | Wt 183.4 lb

## 2015-05-25 DIAGNOSIS — R1084 Generalized abdominal pain: Secondary | ICD-10-CM

## 2015-05-25 NOTE — Progress Notes (Signed)
   Subjective:    Patient ID: Christie [name], female    DOB: 02-26-1946, 69 y.o.   MRN: [id number]  Abdominal Pain This is a new problem. The current episode started 1 to 4 weeks ago. The problem occurs intermittently. The problem has been unchanged. The pain is located in the generalized abdominal region. The pain is moderate. The quality of the pain is aching. The abdominal pain does not radiate. Pertinent negatives include no constipation, diarrhea or nausea. Nothing aggravates the pain. The pain is relieved by nothing. She has tried nothing for the symptoms. The treatment provided no relief.  Patient states that she has some questions about liver testing.   patient relates abdominal discomfort been getting progressive over the past several weeks to the point where it keeps her awake at night. Has significant no pain where she cannot eat well. The pain is in the mid epigastrium Stanton Kidney she is tried her PPI without any success. She has a family history of pancreatic cancer to family members have died of this.   Review of Systems  Respiratory: Negative for cough.   Cardiovascular: Negative for chest pain.  Gastrointestinal: Positive for abdominal pain. Negative for nausea, diarrhea, constipation and blood in stool.       Objective:   Physical Exam   lungs clear hearts regular pulse normal abdomen soft with subjected discomfort in the midepigastric region and the midabdominal region no guarding or rebound. No edema      Assessment & Plan:   abdominal pain progressive over the past several weeks despite use of PPI I believe this patient warrants having a CT scan of the abdomen to help rule out the possibility of a pancreatic tumor. This patient has had a ultrasound recently which could not see the pancreas. In addition to this she has elevated alkaline phosphatase which was felt to be due to the liver. This could be related to a collecting duct problem indicating the possibility of pancreatic head  tumor given that this abdominal pain wakes her up at night CT scan is necessary. May need referral to gastroenterology depending on the results. 25 minutes spent with patient underwent 214

## 2015-05-28 ENCOUNTER — Ambulatory Visit (HOSPITAL_COMMUNITY)
Admission: RE | Admit: 2015-05-28 | Discharge: 2015-05-28 | Disposition: A | Discharge status: Home / Self Care | Payer: Medicare Other | Source: Ambulatory Visit | Attending: Family Medicine | Admitting: Family Medicine

## 2015-05-28 DIAGNOSIS — R1084 Generalized abdominal pain: Secondary | ICD-10-CM | POA: Insufficient documentation

## 2015-05-28 LAB — POCT I-STAT CREATININE: CREATININE: 0.8 mg/dL (ref 0.44–1.00)

## 2015-05-28 MED ORDER — IOHEXOL 300 MG/ML  SOLN
100.0000 mL | Freq: Once | INTRAMUSCULAR | Status: AC | PRN
  Administered 2015-05-28: 100 mL via INTRAVENOUS

## 2015-05-28 NOTE — Addendum Note (Signed)
Addended by: Dairl Ponder on: 05/28/2015 01:54 PM   Modules accepted: Orders

## 2015-06-09 ENCOUNTER — Encounter (INDEPENDENT_AMBULATORY_CARE_PROVIDER_SITE_OTHER): Payer: Self-pay | Admitting: *Deleted

## 2015-06-10 ENCOUNTER — Telehealth: Payer: Self-pay | Admitting: Family Medicine

## 2015-06-10 NOTE — Telephone Encounter (Signed)
Rx called into [facility]. Patient notified.

## 2015-06-10 NOTE — Telephone Encounter (Signed)
[place] [phone]  Having issues with her sinuses while at the beach,  Please send script to this pharmacy   Please call pt when its sent   [phone]

## 2015-06-10 NOTE — Telephone Encounter (Signed)
Amoxicillin 500 mg 1 3 times a day for 10 days would be safe with the medications that she is taking and should do a good job follow-up if ongoing troubles

## 2015-06-16 ENCOUNTER — Other Ambulatory Visit: Payer: Self-pay | Admitting: Family Medicine

## 2015-06-18 ENCOUNTER — Encounter: Payer: Self-pay | Admitting: Nurse Practitioner

## 2015-06-18 ENCOUNTER — Ambulatory Visit (INDEPENDENT_AMBULATORY_CARE_PROVIDER_SITE_OTHER): Payer: Medicare Other | Admitting: Nurse Practitioner

## 2015-06-18 VITALS — BP 130/82 | Temp 98.7°F | Wt 184.0 lb

## 2015-06-18 DIAGNOSIS — J329 Chronic sinusitis, unspecified: Secondary | ICD-10-CM | POA: Diagnosis not present

## 2015-06-18 MED ORDER — LEVOFLOXACIN 500 MG PO TABS: 500.0000 mg | ORAL_TABLET | Freq: Every day | ORAL | Status: DC

## 2015-06-18 NOTE — Patient Instructions (Signed)
Antihistamine as directed Continue Mucinex Nasacort AQ as directed

## 2015-06-21 ENCOUNTER — Encounter: Payer: Self-pay | Admitting: Nurse Practitioner

## 2015-06-21 NOTE — Progress Notes (Signed)
Subjective:  Presents for c/o sinus symptoms x 2 weeks. No fever. Frontal area headache. Yellow nasal drainage. Frequent non productive cough. No wheezing or sore throat. Ear pain and pressure. Has taken Amoxil.   Objective:   BP 130/82 mmHg  Temp(Src) 98.7 F (37.1 C) (Oral)  Wt 184 lb (83.462 kg) NAD. Alert, oriented. TMs clear effusion, no erythema. Pharynx injected with PND noted. Neck supple with mild anterior adenopathy. Lungs clear. Heart RRR.   Assessment:  Rhinosinusitis  Plan:  Meds ordered this encounter  Medications  . levofloxacin (LEVAQUIN) 500 MG tablet    Sig: Take 1 tablet (500 mg total) by mouth daily.    Dispense:  10 tablet    Refill:  0    Order Specific Question:  Supervising Provider    Answer:  Mikey Kirschner [[id number]]   Antihistamine as directed Continue Mucinex Nasacort AQ as directed Return if symptoms worsen or fail to improve.

## 2015-07-07 ENCOUNTER — Ambulatory Visit (INDEPENDENT_AMBULATORY_CARE_PROVIDER_SITE_OTHER): Payer: Medicare Other | Admitting: Internal Medicine

## 2015-07-07 ENCOUNTER — Encounter (INDEPENDENT_AMBULATORY_CARE_PROVIDER_SITE_OTHER): Payer: Self-pay | Admitting: Internal Medicine

## 2015-07-07 VITALS — BP 130/60 | HR 72 | Temp 98.2°F | Ht 63.5 in | Wt 183.6 lb

## 2015-07-07 DIAGNOSIS — R1084 Generalized abdominal pain: Secondary | ICD-10-CM | POA: Diagnosis not present

## 2015-07-07 LAB — HEPATIC FUNCTION PANEL
ALT: 26 U/L (ref 0–35)
AST: 28 U/L (ref 0–37)
Albumin: 4 g/dL (ref 3.5–5.2)
Alkaline Phosphatase: 113 U/L (ref 39–117)
Bilirubin, Direct: 0.1 mg/dL (ref 0.0–0.3)
Indirect Bilirubin: 0.3 mg/dL (ref 0.2–1.2)
Total Bilirubin: 0.4 mg/dL (ref 0.2–1.2)
Total Protein: 6.6 g/dL (ref 6.0–8.3)

## 2015-07-07 NOTE — Patient Instructions (Addendum)
CBC  And CMET, lipase

## 2015-07-07 NOTE — Progress Notes (Addendum)
Subjective:    Patient ID: Christie [name], female    DOB: [birth year], 69 y.o.   MRN: [id number]  HPI Referred to our office abdominal pain, which she says the pain is all over. She underwent a CT and and [place] and both were normal.  She says for the last 2-3 days she has not had any pain. She is not sure if it is stress or nerves. She has a hx of vulvar cancer. She did not have to have any tx.and was followed by Dr. Juanda Crumble Pippitt at [place]. Last pap smear in December and was normal  Appetite is good. She has had some weight loss which was intentional. She denies any acid reflux.  She cannot pinpoint really where is abdominal pain is. She describes as a discomfort or heaviness. She points to her epigastric region and lower abdomen. She usually has BM daily.  Mother had pancreatic cancer as well as her brother.  03/26/2015 H and H 15.7 and 47.9    Colonoscopy 2015 Average risk: Dr. Laural Golden:  Impression:  Normal colonoscopy except single small anal papilla and external hemorrhoids.    05/28/2015 CT abdomen/pelvis with CM; abdominal pain: MPRESSION: Benign appearing abdomen. Prior cholecystectomy.  05/07/2015 US abdomen: elevated liver enzymes  Normal   Hepatic Function Panel   CBC      Review of Systems Past Medical History  Diagnosis Date  . DM (diabetes mellitus) 2012  . Hypertension 2000  . 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 Hyperlipidemia   . Elevated liver enzymes   . Spastic colon   . Chronic kidney disease 11/2008    stones    Past Surgical History  Procedure Laterality Date  . Total abdominal hysterectomy  1982  . Stapedectomy Bilateral     Right 1991 and Left 1994  . Back surgery  2004  . Colonoscopy  feb 2005  . Esophagogastroduodenoscopy  09/2005  . Eye surgery Right     catatract removed  . Eye surgery Right     macular   surgery   . Cholecystectomy    . Colonoscopy N/A 02/13/2014    Procedure: COLONOSCOPY;  Surgeon: Rogene Houston,  MD;  Location: [facility];  Service: Endoscopy;  Laterality: N/A;  930-moved to 100 Ann notified pt    Allergies  Allergen Reactions  . Ace Inhibitors   . Lipitor [Atorvastatin]     Liver enzymes  . Metformin And Related     Side effects  . Percocet [Oxycodone-Acetaminophen]     Pt does not want any percoce    Current Outpatient Prescriptions on File Prior to Visit  Medication Sig Dispense Refill  . ALPRAZolam (XANAX) 0.5 MG tablet TAKE 1 TABLET BY MOUTH AT BEDTIME FOR SLEEP. 30 tablet 5  . amLODipine (NORVASC) 2.5 MG tablet Take 2.5 mg by mouth daily.    Marland Kitchen aspirin 81 MG tablet Take 81 mg by mouth daily.    . cholecalciferol (VITAMIN D) 1000 UNITS tablet Take 1,000 Units by mouth daily.    . citalopram (CELEXA) 20 MG tablet TAKE ONE TABLET BY MOUTH ONCE DAILY. 30 tablet 4  . CRESTOR 20 MG tablet TAKE ONE TABLET BY MOUTH ONCE DAILY. 30 tablet 4  . glipiZIDE (GLUCOTROL) 5 MG tablet TAKE 2 TABLETS BY MOUTH TWICE DAILY. 120 tablet 6  . INVOKANA 100 MG TABS tablet TAKE ONE TABLET BY MOUTH ONCE DAILY. 30 tablet 5  . losartan-hydrochlorothiazide (HYZAAR) 100-12.5 MG per tablet TAKE  ONE TABLET BY MOUTH ONCE DAILY. 30 tablet 4  . Multiple Vitamins-Minerals (CENTRUM SILVER PO) Take by mouth.    . Multiple Vitamins-Minerals (MACULAR VITAMIN BENEFIT PO) Take 1 tablet by mouth daily.    Marland Kitchen omeprazole (PRILOSEC) 20 MG capsule Take 1 capsule (20 mg total) by mouth daily as needed (FOR ACID REFLUX). 30 capsule 5  . Probiotic Product (HEALTHY COLON PO) Take by mouth. One qam    . ACCU-CHEK SOFTCLIX LANCETS lancets     . EASYMAX TEST test strip USE TO TEST BLOOD SUGAR ONCE DAILY. 50 each 1  . levofloxacin (LEVAQUIN) 500 MG tablet Take 1 tablet (500 mg total) by mouth daily. (Patient not taking: Reported on 07/07/2015) 10 tablet 0   No current facility-administered medications on file prior to visit.        Objective:   Physical Exam Blood pressure 130/60, pulse 72, temperature 98.2 F (36.8  C), height 5' 3.5" (1.613 m), weight 183 lb 9.6 oz (83.28 kg).  Alert and oriented. Skin warm and dry. Oral mucosa is moist.   . Sclera anicteric, conjunctivae is pink. Thyroid not enlarged. No cervical lymphadenopathy. Lungs clear. Heart regular rate and rhythm.  Abdomen is soft. Bowel sounds are positive. No hepatomegaly. No abdominal masses felt. No tenderness.  No edema to lower extremities. Stool brown and guaiac negative.       Assessment & Plan:  Abdominal pain ? Etiology. Recent CT and [place] normal.  ALP elevated. Am going to get a CBC, Hepatic function, and LIpase today.

## 2015-07-28 LAB — CBC WITH DIFFERENTIAL/PLATELET
BASOS PCT: 0 % (ref 0–1)
Basophils Absolute: 0 10*3/uL (ref 0.0–0.1)
Eosinophils Absolute: 0.3 10*3/uL (ref 0.0–0.7)
Eosinophils Relative: 3 % (ref 0–5)
HCT: 42.9 % (ref 36.0–46.0)
Hemoglobin: 14.2 g/dL (ref 12.0–15.0)
LYMPHS ABS: 2.6 10*3/uL (ref 0.7–4.0)
LYMPHS PCT: 29 % (ref 12–46)
MCH: 29.2 pg (ref 26.0–34.0)
MCHC: 33.1 g/dL (ref 30.0–36.0)
MCV: 88.1 fL (ref 78.0–100.0)
MONO ABS: 0.5 10*3/uL (ref 0.1–1.0)
MPV: 11.1 fL (ref 8.6–12.4)
Monocytes Relative: 6 % (ref 3–12)
NEUTROS PCT: 62 % (ref 43–77)
Neutro Abs: 5.5 10*3/uL (ref 1.7–7.7)
Platelets: 183 10*3/uL (ref 150–400)
RBC: 4.87 MIL/uL (ref 3.87–5.11)
RDW: 13.8 % (ref 11.5–15.5)
WBC: 8.9 10*3/uL (ref 4.0–10.5)

## 2015-07-28 LAB — LIPASE: Lipase: 46 U/L (ref 7–60)

## 2015-07-30 ENCOUNTER — Other Ambulatory Visit: Payer: Self-pay | Admitting: Family Medicine

## 2015-08-06 ENCOUNTER — Encounter (INDEPENDENT_AMBULATORY_CARE_PROVIDER_SITE_OTHER): Payer: Self-pay | Admitting: *Deleted

## 2015-09-02 ENCOUNTER — Other Ambulatory Visit: Payer: Self-pay | Admitting: Family Medicine

## 2015-09-09 ENCOUNTER — Encounter: Payer: Self-pay | Admitting: Family Medicine

## 2015-09-14 LAB — HM DIABETES EYE EXAM

## 2015-10-02 ENCOUNTER — Telehealth: Payer: Self-pay | Admitting: Family Medicine

## 2015-10-02 ENCOUNTER — Other Ambulatory Visit: Payer: Self-pay | Admitting: Family Medicine

## 2015-10-02 DIAGNOSIS — E785 Hyperlipidemia, unspecified: Secondary | ICD-10-CM

## 2015-10-02 DIAGNOSIS — E119 Type 2 diabetes mellitus without complications: Secondary | ICD-10-CM

## 2015-10-02 DIAGNOSIS — Z79899 Other long term (current) drug therapy: Secondary | ICD-10-CM

## 2015-10-02 NOTE — Telephone Encounter (Signed)
Please do lab work, lipid, liver, metabolic 7, hemoglobin J2I, urine ACR

## 2015-10-02 NOTE — Telephone Encounter (Signed)
Notified patient that bloodwork has been ordered.  

## 2015-10-02 NOTE — Telephone Encounter (Signed)
Pt thought you were putting labs in for her next OV, or was she  To just get a sugar check done here in the office at her appt?   Please advise

## 2015-10-08 ENCOUNTER — Encounter: Payer: Self-pay | Admitting: *Deleted

## 2015-10-09 LAB — BASIC METABOLIC PANEL
BUN/Creatinine Ratio: 23 (ref 11–26)
BUN: 17 mg/dL (ref 8–27)
CALCIUM: 9.7 mg/dL (ref 8.7–10.3)
CHLORIDE: 100 mmol/L (ref 97–106)
CO2: 26 mmol/L (ref 18–29)
Creatinine, Ser: 0.75 mg/dL (ref 0.57–1.00)
GFR calc non Af Amer: 80 mL/min/{1.73_m2} (ref >59)
GFR, EST AFRICAN AMERICAN: 93 mL/min/{1.73_m2} (ref >59)
Glucose: 128 mg/dL — ABNORMAL HIGH (ref 65–99)
Potassium: 4.1 mmol/L (ref 3.5–5.2)
Sodium: 141 mmol/L (ref 136–144)

## 2015-10-09 LAB — LIPID PANEL
CHOLESTEROL TOTAL: 157 mg/dL (ref 100–199)
Chol/HDL Ratio: 2.4 ratio units (ref 0.0–4.4)
HDL: 65 mg/dL (ref >39)
LDL CALC: 71 mg/dL (ref 0–99)
Triglycerides: 107 mg/dL (ref 0–149)
VLDL CHOLESTEROL CAL: 21 mg/dL (ref 5–40)

## 2015-10-09 LAB — HEMOGLOBIN A1C
Est. average glucose Bld gHb Est-mCnc: 134 mg/dL
Hgb A1c MFr Bld: 6.3 % — ABNORMAL HIGH (ref 4.8–5.6)

## 2015-10-09 LAB — HEPATIC FUNCTION PANEL
ALT: 28 IU/L (ref 0–32)
AST: 28 IU/L (ref 0–40)
Albumin: 4.4 g/dL (ref 3.5–4.8)
Alkaline Phosphatase: 136 IU/L — ABNORMAL HIGH (ref 39–117)
BILIRUBIN TOTAL: 0.6 mg/dL (ref 0.0–1.2)
Bilirubin, Direct: 0.23 mg/dL (ref 0.00–0.40)
Total Protein: 6.6 g/dL (ref 6.0–8.5)

## 2015-10-09 LAB — MICROALBUMIN / CREATININE URINE RATIO
Creatinine, Urine: 51.4 mg/dL
MICROALB/CREAT RATIO: <5.8 mg/g creat (ref 0.0–30.0)
Microalbumin, Urine: <3 ug/mL

## 2015-10-12 ENCOUNTER — Ambulatory Visit (INDEPENDENT_AMBULATORY_CARE_PROVIDER_SITE_OTHER): Payer: Medicare Other | Admitting: Family Medicine

## 2015-10-12 ENCOUNTER — Encounter: Payer: Self-pay | Admitting: Family Medicine

## 2015-10-12 VITALS — BP 142/76 | Ht 64.0 in | Wt 184.2 lb

## 2015-10-12 DIAGNOSIS — E785 Hyperlipidemia, unspecified: Secondary | ICD-10-CM

## 2015-10-12 DIAGNOSIS — Z23 Encounter for immunization: Secondary | ICD-10-CM | POA: Diagnosis not present

## 2015-10-12 DIAGNOSIS — R0989 Other specified symptoms and signs involving the circulatory and respiratory systems: Secondary | ICD-10-CM | POA: Diagnosis not present

## 2015-10-12 DIAGNOSIS — I10 Essential (primary) hypertension: Secondary | ICD-10-CM

## 2015-10-12 NOTE — Progress Notes (Signed)
   Subjective:    Patient ID: Christie [name], female    DOB: January 21, 1946, 69 y.o.   MRN: [id number]  Hypertension This is a chronic problem. The current episode started more than 1 year ago. Pertinent negatives include no chest pain. There are no compliance problems.    Patient had recent labs drawn on 10/09/15  Patient has c/o of dizziness. Patient states she has been having dizziness for over a year with symptoms being more persistent in the recent weeks.  Head feels "swimmy". No unilateral numbness or weakness Happens multpile times per week  patient takes her blood pressure medicine on a regular basis occasionally gets dizzy when she sits up or stands up it is not severe she does not lose consciousness with it  patient also relates occasional low sugar spells in the morning but this is only once or twice a month more so when she eats very light she has learned what to eat and sometimes eating a snack as well.  Patient states her moods are doing good denies excessive anxiousness. Denies being depressed.   Review of Systems  Constitutional: Negative for activity change, appetite change and fatigue.  HENT: Negative for congestion.   Respiratory: Negative for cough.   Cardiovascular: Negative for chest pain.  Gastrointestinal: Negative for abdominal pain.  Endocrine: Negative for polydipsia and polyphagia.  Neurological: Positive for dizziness. Negative for weakness.  Psychiatric/Behavioral: Negative for confusion.       Objective:   Physical Exam  Constitutional: She appears well-nourished. No distress.  HENT:  Carotid bruit  Cardiovascular: Normal rate, regular rhythm and normal heart sounds.   No murmur heard. Pulmonary/Chest: Effort normal and breath sounds normal. No respiratory distress.  Musculoskeletal: She exhibits no edema.  Lymphadenopathy:    She has no cervical adenopathy.  Neurological: She is alert. She exhibits normal muscle tone.  Psychiatric: Her behavior is  normal.  Vitals reviewed.   25 minutes was spent with the patient. Greater than half the time was spent in discussion and answering questions and counseling regarding the issues that the patient came in for today.       Assessment & Plan:   dizziness-intermittent. She has a carotid bruit on the left side. I would recommend that we do Doppler studies on her neck   HTN stable continue current measures orthostatics are negative   Diabetes A1c actually looks improved. Continue current measures if any low sugar spells to let us know.   Hyperlipidemia good control overall continue current measures   reflux controlled with current medication

## 2015-10-22 ENCOUNTER — Ambulatory Visit (HOSPITAL_COMMUNITY)
Admission: RE | Admit: 2015-10-22 | Discharge: 2015-10-22 | Disposition: A | Discharge status: Home / Self Care | Payer: Medicare Other | Source: Ambulatory Visit | Attending: Family Medicine | Admitting: Family Medicine

## 2015-10-22 DIAGNOSIS — R0989 Other specified symptoms and signs involving the circulatory and respiratory systems: Secondary | ICD-10-CM | POA: Insufficient documentation

## 2015-11-13 ENCOUNTER — Other Ambulatory Visit: Payer: Self-pay | Admitting: Family Medicine

## 2015-11-24 ENCOUNTER — Other Ambulatory Visit: Payer: Self-pay | Admitting: Family Medicine

## 2015-11-24 NOTE — Telephone Encounter (Signed)
May have this and 5 refills

## 2015-11-27 ENCOUNTER — Other Ambulatory Visit: Payer: Self-pay | Admitting: Family Medicine

## 2015-12-29 IMAGING — US US ABDOMEN LIMITED
1 series · 14 of 25 positions shown · non-contrast
Comparison: None.

CLINICAL DATA: Elevated liver function studies

EXAM:
US ABDOMEN LIMITED - RIGHT UPPER QUADRANT

[Series 1: us abdomen limited · 0.17mm/px · 14 of 54 slices shown]
[im 1/54]
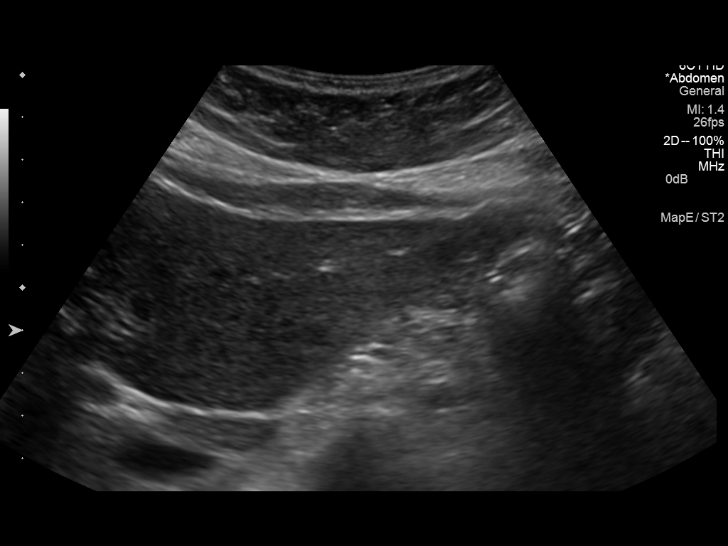
[im 5/54]
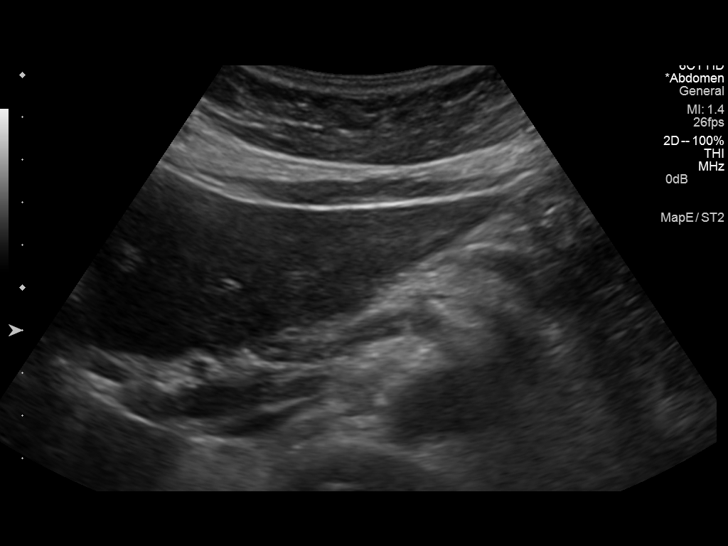
[im 9/54]
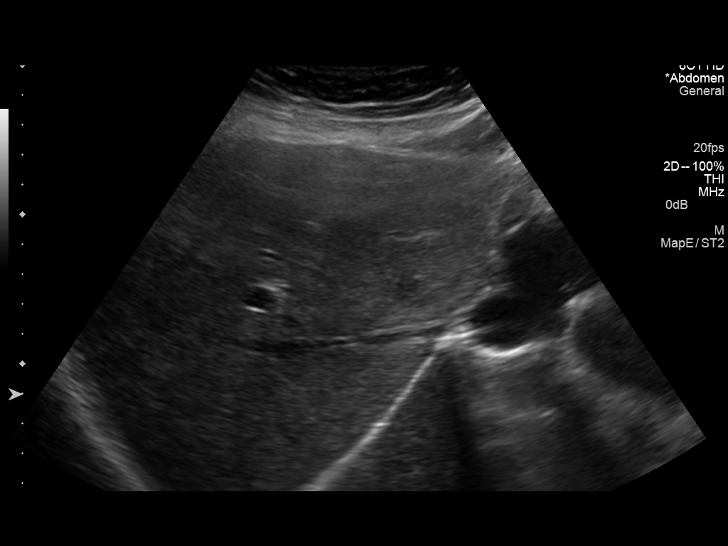
[im 14/54]
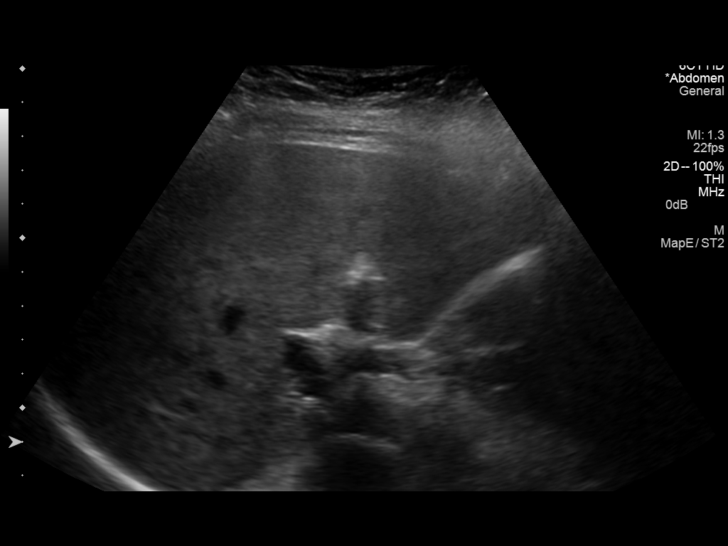
[im 18/54]
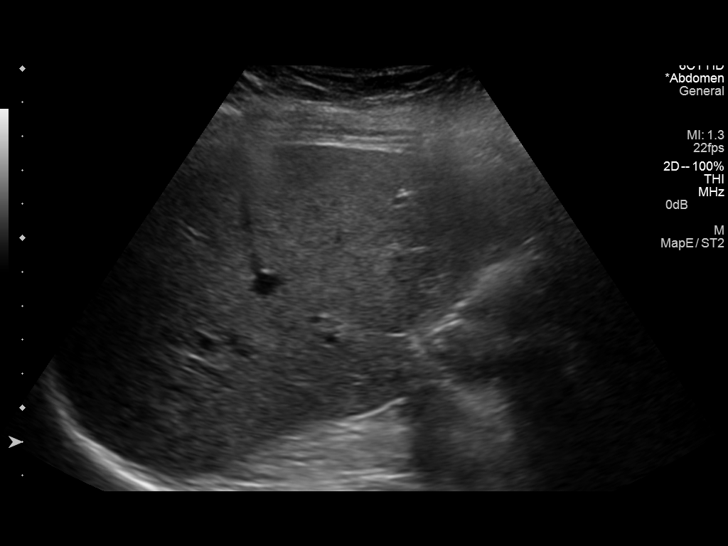
[im 20/54]
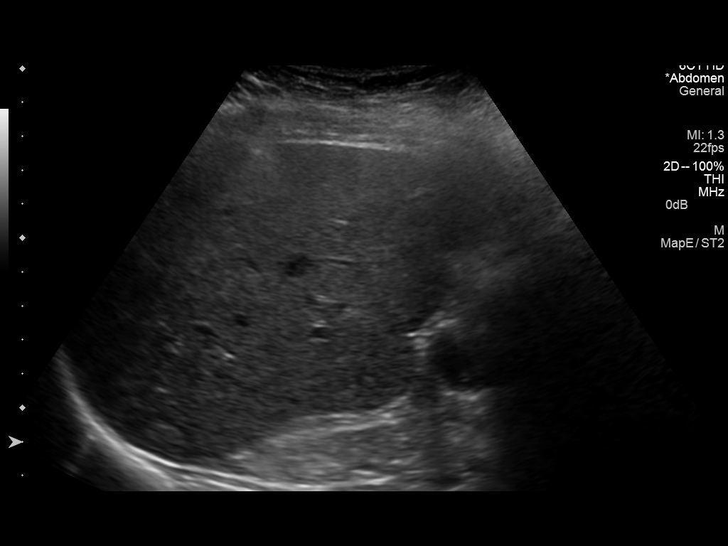
[im 25/54]
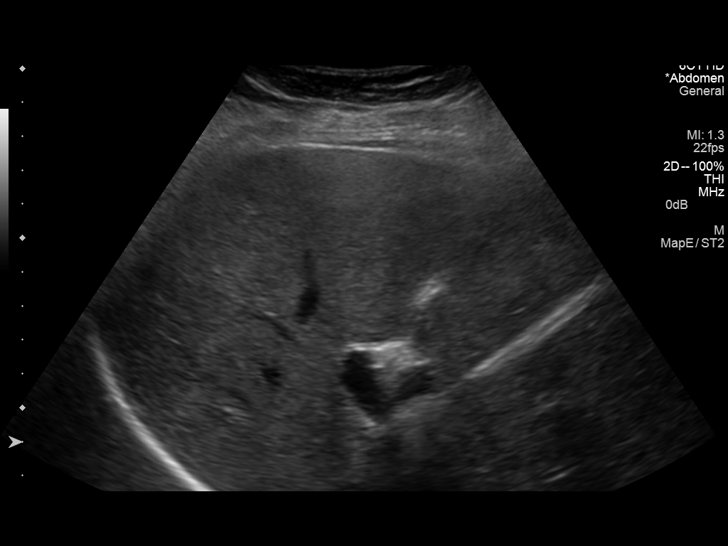
[im 29/54]
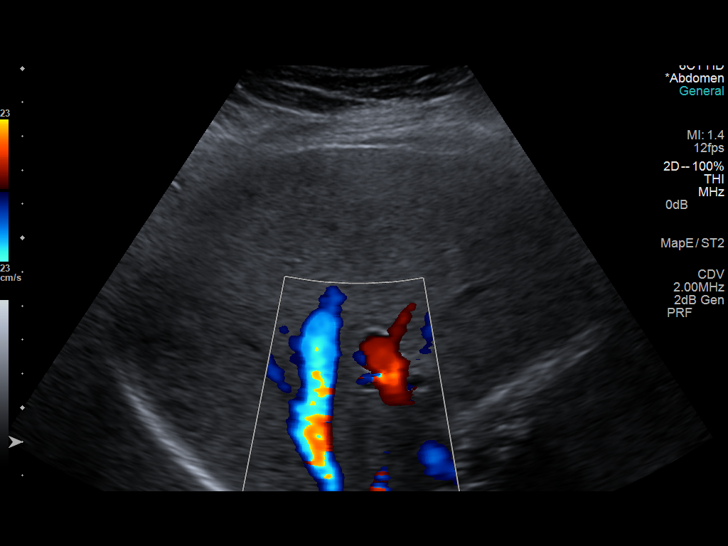
[im 34/54]
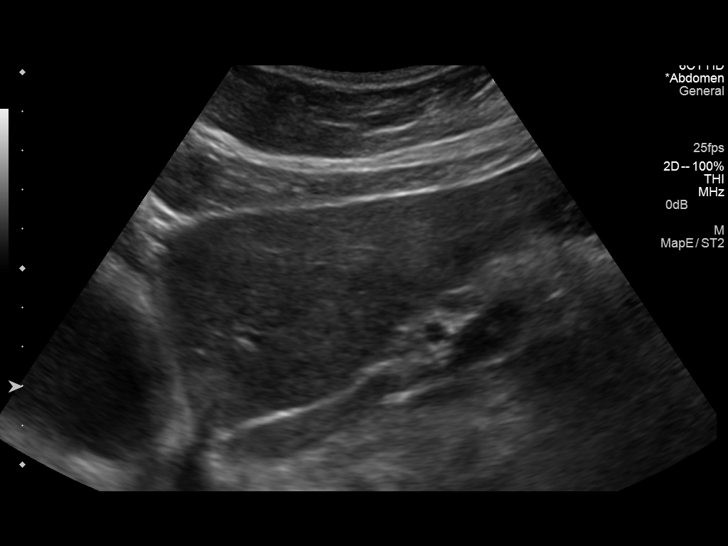
[im 36/54]
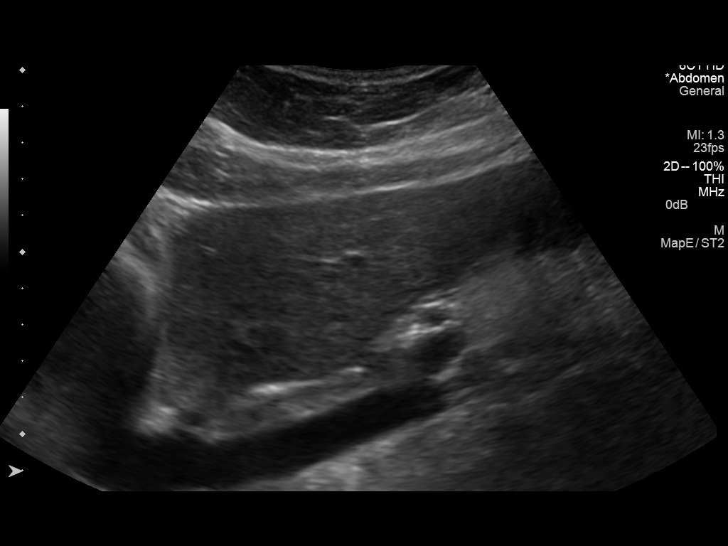
[im 40/54]
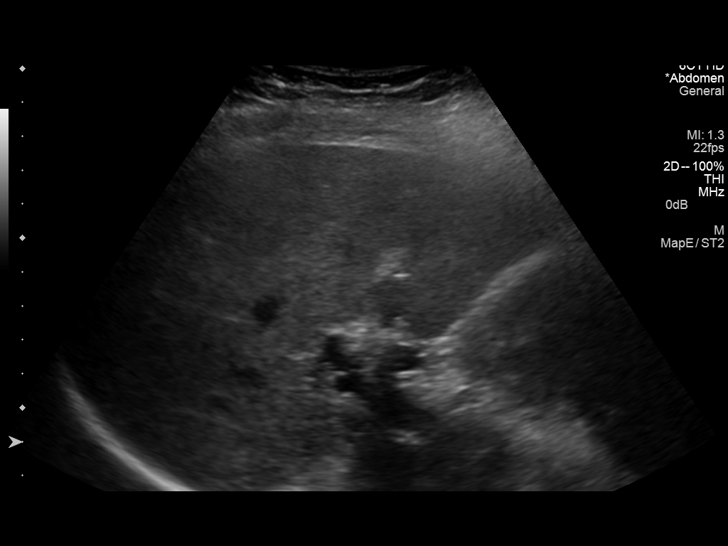
[im 45/54]
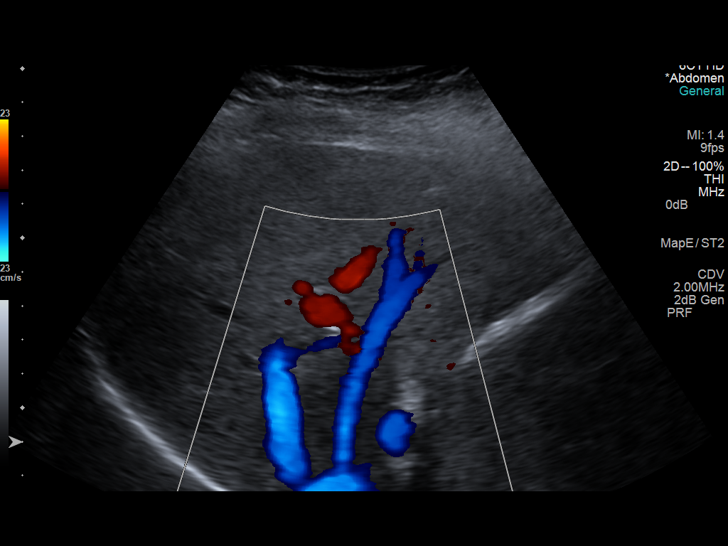
[im 49/54]
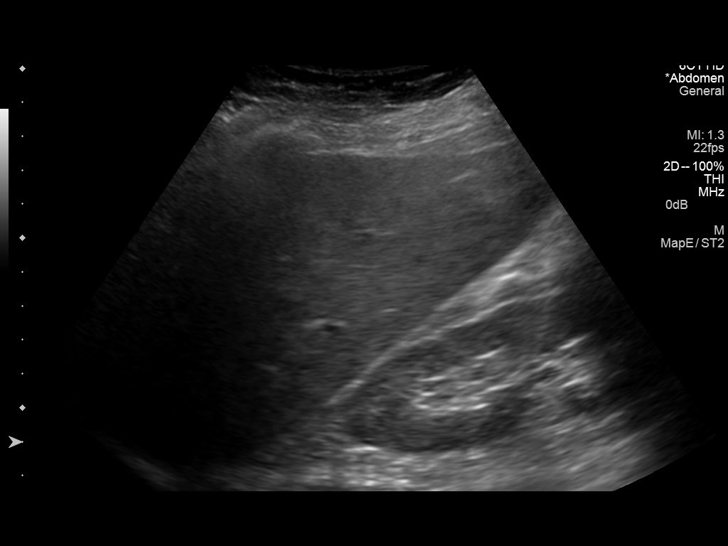
[im 54/54]
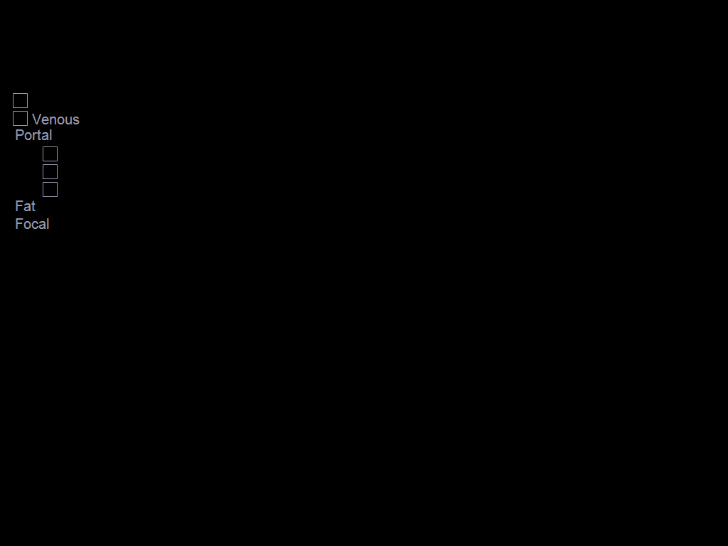

[14 of 25 positions shown; findings below may reference images not displayed]

FINDINGS: Gallbladder:

The gallbladder is surgically absent

Common bile duct:

Diameter: 8.5 mm.  No abnormal intraluminal echoes are demonstrated.

Liver:

The liver exhibits normal echotexture. There is no focal mass nor
ductal dilation. The surface contour is normal.
IMPRESSION: Normal limited right upper quadrant ultrasound in this patient who
has undergone previous cholecystectomy.

## 2016-01-11 ENCOUNTER — Other Ambulatory Visit: Payer: Self-pay | Admitting: Family Medicine

## 2016-01-21 ENCOUNTER — Ambulatory Visit (INDEPENDENT_AMBULATORY_CARE_PROVIDER_SITE_OTHER): Payer: Medicare Other | Admitting: Ophthalmology

## 2016-01-21 DIAGNOSIS — I10 Essential (primary) hypertension: Secondary | ICD-10-CM | POA: Diagnosis not present

## 2016-01-21 DIAGNOSIS — H35341 Macular cyst, hole, or pseudohole, right eye: Secondary | ICD-10-CM | POA: Diagnosis not present

## 2016-01-21 DIAGNOSIS — H35033 Hypertensive retinopathy, bilateral: Secondary | ICD-10-CM

## 2016-01-21 DIAGNOSIS — H43813 Vitreous degeneration, bilateral: Secondary | ICD-10-CM

## 2016-01-21 DIAGNOSIS — H35372 Puckering of macula, left eye: Secondary | ICD-10-CM

## 2016-01-25 ENCOUNTER — Other Ambulatory Visit: Payer: Self-pay | Admitting: Family Medicine

## 2016-01-28 ENCOUNTER — Ambulatory Visit (INDEPENDENT_AMBULATORY_CARE_PROVIDER_SITE_OTHER): Payer: Medicare Other | Admitting: Family Medicine

## 2016-01-28 ENCOUNTER — Encounter: Payer: Self-pay | Admitting: Family Medicine

## 2016-01-28 VITALS — BP 130/80 | Temp 99.3°F | Ht 64.0 in | Wt 182.0 lb

## 2016-01-28 DIAGNOSIS — J019 Acute sinusitis, unspecified: Secondary | ICD-10-CM | POA: Diagnosis not present

## 2016-01-28 DIAGNOSIS — B9689 Other specified bacterial agents as the cause of diseases classified elsewhere: Secondary | ICD-10-CM

## 2016-01-28 MED ORDER — AMOXICILLIN 500 MG PO TABS: 500.0000 mg | ORAL_TABLET | Freq: Three times a day (TID) | ORAL | Status: DC

## 2016-01-28 NOTE — Progress Notes (Signed)
   Subjective:    Patient ID: Christie [name], female    DOB: 1946-04-23, 70 y.o.   MRN: [id number]  Sinusitis This is a new problem. The current episode started 1 to 4 weeks ago. The problem is unchanged. There has been no fever. The pain is moderate. Associated symptoms include congestion and coughing. Pertinent negatives include no ear pain or shortness of breath. Past treatments include oral decongestants. The treatment provided no relief.    Patient has no other concerns at this time. Patient had a viral like illness few weeks ago with vomiting diarrhea over the past couple weeks head congestion drainage sinus pressures now present denies wheezing or difficulty breathing  Review of Systems  Constitutional: Negative for fever and activity change.  HENT: Positive for congestion and rhinorrhea. Negative for ear pain.   Eyes: Negative for discharge.  Respiratory: Positive for cough. Negative for shortness of breath and wheezing.   Cardiovascular: Negative for chest pain.       Objective:   Physical Exam  Constitutional: She appears well-developed.  HENT:  Head: Normocephalic.  Nose: Nose normal.  Mouth/Throat: Oropharynx is clear and moist. No oropharyngeal exudate.  Neck: Neck supple.  Cardiovascular: Normal rate and normal heart sounds.   No murmur heard. Pulmonary/Chest: Effort normal and breath sounds normal. She has no wheezes.  Lymphadenopathy:    She has no cervical adenopathy.  Skin: Skin is warm and dry.  Nursing note and vitals reviewed.         Assessment & Plan:  Viral syndrome Secondary rhinosinusitis Recent gastroenteritis resolving Antibiotics prescribed warning signs discussed follow-up if problems

## 2016-02-04 ENCOUNTER — Ambulatory Visit (INDEPENDENT_AMBULATORY_CARE_PROVIDER_SITE_OTHER): Payer: Medicare Other | Admitting: Internal Medicine

## 2016-02-22 ENCOUNTER — Encounter: Payer: Medicare Other | Admitting: Family Medicine

## 2016-03-24 ENCOUNTER — Telehealth: Payer: Self-pay | Admitting: Family Medicine

## 2016-03-24 DIAGNOSIS — E785 Hyperlipidemia, unspecified: Secondary | ICD-10-CM

## 2016-03-24 DIAGNOSIS — E119 Type 2 diabetes mellitus without complications: Secondary | ICD-10-CM

## 2016-03-24 DIAGNOSIS — I10 Essential (primary) hypertension: Secondary | ICD-10-CM

## 2016-03-24 NOTE — Telephone Encounter (Signed)
Lipid, liver, metabolic 7, hemoglobin C1U

## 2016-03-24 NOTE — Telephone Encounter (Signed)
Patient needs BW ordered for appointment on 04/11/2016.

## 2016-03-25 NOTE — Telephone Encounter (Signed)
Spoke with patient and informed her per Wallace have been ordered for upcoming appointment. Advised patient to be fasting prior to having labs drawn. Patient verbalized understanding.

## 2016-04-02 ENCOUNTER — Other Ambulatory Visit: Payer: Self-pay | Admitting: Family Medicine

## 2016-04-08 LAB — BASIC METABOLIC PANEL
BUN/Creatinine Ratio: 30 — ABNORMAL HIGH (ref 12–28)
BUN: 18 mg/dL (ref 8–27)
CHLORIDE: 100 mmol/L (ref 96–106)
CO2: 25 mmol/L (ref 18–29)
Calcium: 9.7 mg/dL (ref 8.7–10.3)
Creatinine, Ser: 0.6 mg/dL (ref 0.57–1.00)
GFR calc Af Amer: 106 mL/min/{1.73_m2} (ref >59)
GFR calc non Af Amer: 92 mL/min/{1.73_m2} (ref >59)
GLUCOSE: 121 mg/dL — AB (ref 65–99)
Potassium: 4.3 mmol/L (ref 3.5–5.2)
Sodium: 141 mmol/L (ref 134–144)

## 2016-04-08 LAB — HEPATIC FUNCTION PANEL
ALT: 31 IU/L (ref 0–32)
AST: 30 IU/L (ref 0–40)
Albumin: 4.4 g/dL (ref 3.5–4.8)
Alkaline Phosphatase: 129 IU/L — ABNORMAL HIGH (ref 39–117)
BILIRUBIN TOTAL: 0.7 mg/dL (ref 0.0–1.2)
Bilirubin, Direct: 0.2 mg/dL (ref 0.00–0.40)
TOTAL PROTEIN: 6.7 g/dL (ref 6.0–8.5)

## 2016-04-08 LAB — LIPID PANEL
CHOL/HDL RATIO: 2.2 ratio (ref 0.0–4.4)
Cholesterol, Total: 155 mg/dL (ref 100–199)
HDL: 69 mg/dL (ref >39)
LDL Calculated: 65 mg/dL (ref 0–99)
TRIGLYCERIDES: 103 mg/dL (ref 0–149)
VLDL Cholesterol Cal: 21 mg/dL (ref 5–40)

## 2016-04-08 LAB — HEMOGLOBIN A1C
Est. average glucose Bld gHb Est-mCnc: 140 mg/dL
HEMOGLOBIN A1C: 6.5 % — AB (ref 4.8–5.6)

## 2016-04-11 ENCOUNTER — Encounter: Payer: Self-pay | Admitting: Family Medicine

## 2016-04-11 ENCOUNTER — Ambulatory Visit (INDEPENDENT_AMBULATORY_CARE_PROVIDER_SITE_OTHER): Payer: Medicare Other | Admitting: Family Medicine

## 2016-04-11 VITALS — BP 116/74 | Ht 62.25 in | Wt 184.0 lb

## 2016-04-11 DIAGNOSIS — I10 Essential (primary) hypertension: Secondary | ICD-10-CM

## 2016-04-11 DIAGNOSIS — Z124 Encounter for screening for malignant neoplasm of cervix: Secondary | ICD-10-CM | POA: Diagnosis not present

## 2016-04-11 DIAGNOSIS — E785 Hyperlipidemia, unspecified: Secondary | ICD-10-CM | POA: Diagnosis not present

## 2016-04-11 DIAGNOSIS — E119 Type 2 diabetes mellitus without complications: Secondary | ICD-10-CM

## 2016-04-11 DIAGNOSIS — Z0000 Encounter for general adult medical examination without abnormal findings: Secondary | ICD-10-CM | POA: Diagnosis not present

## 2016-04-11 NOTE — Progress Notes (Signed)
Subjective:    Patient ID: Christie [name], female    DOB: [birth year], 70 y.o.   MRN: [id number]  HPI AWV- Annual Wellness Visit  The patient was seen for their annual wellness visit. The patient's past medical history, surgical history, and family history were reviewed. Pertinent vaccines were reviewed ( tetanus, pneumonia, shingles, flu) The patient's medication list was reviewed and updated.  The height and weight were entered. The patient's current BMI is: 31.22  Cognitive screening was completed. Outcome of Mini - Cog: pass  Falls within the past 6 months: none  Current tobacco usage: none (All patients who use tobacco were given written and verbal information on quitting)  Recent listing of emergency department/hospitalizations over the past year were reviewed.  current specialist the patient sees on a regular basis: Dr. Vertell Limber having surgery May 9th   Medicare annual wellness visit patient questionnaire was reviewed.  A written screening schedule for the patient for the next 5-10 years was given. Appropriate discussion of followup regarding next visit was discussed.  Concerns about gas after eating. Has not tried any treatments. Patient relates increased amount of gas after she eats she denies any vomiting denies any bloody or mucousy stools    Review of Systems  Constitutional: Negative for activity change, appetite change and fatigue.  HENT: Negative for congestion, ear discharge and rhinorrhea.   Eyes: Negative for discharge.  Respiratory: Negative for cough, chest tightness and wheezing.   Cardiovascular: Negative for chest pain.  Gastrointestinal: Negative for vomiting and abdominal pain.  Genitourinary: Negative for frequency and difficulty urinating.  Musculoskeletal: Negative for neck pain.  Allergic/Immunologic: Negative for environmental allergies and food allergies.  Neurological: Negative for weakness and headaches.  Psychiatric/Behavioral: Negative for  behavioral problems and agitation.       Objective:   Physical Exam  Constitutional: She is oriented to person, place, and time. She appears well-developed and well-nourished.  HENT:  Head: Normocephalic.  Right Ear: External ear normal.  Left Ear: External ear normal.  Eyes: Pupils are equal, round, and reactive to light.  Neck: Normal range of motion. No thyromegaly present.  Cardiovascular: Normal rate, regular rhythm, normal heart sounds and intact distal pulses.   No murmur heard. Pulmonary/Chest: Effort normal and breath sounds normal. No respiratory distress. She has no wheezes.  Abdominal: Soft. Bowel sounds are normal. She exhibits no distension and no mass. There is no tenderness.  Musculoskeletal: Normal range of motion. She exhibits no edema or tenderness.  Lymphadenopathy:    She has no cervical adenopathy.  Neurological: She is alert and oriented to person, place, and time. She exhibits normal muscle tone.  Skin: Skin is warm and dry.  Psychiatric: She has a normal mood and affect. Her behavior is normal.  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 Has a normal appearance to it no signs that it appears to be cancerous Pap smear was taken although likelihood of any problems is low Patient with sciatica in the right leg is having surgery May 9        Assessment & Plan:  Surgery May 9 importance for the patient to follow directions of the surgeon to avoid risk of DVT Avoid all anti-inflammatories and aspirin one week before surgery She is did not take her glipizide the night before surgery or the morning of surgery and then based whether or not she takes it later in the day based around how she  is doing Wellness safety dietary discussed Patient not depressed Cognitive doing well No falls Importance of follow-up in 6 months time discussed Diabetes discussed in detail importance of maintaining medication Blood  pressure overall doing well Cholesterol doing well

## 2016-04-13 LAB — PAP IG W/ RFLX HPV ASCU: PAP Smear Comment: 0

## 2016-04-25 ENCOUNTER — Other Ambulatory Visit: Payer: Self-pay | Admitting: Family Medicine

## 2016-05-02 ENCOUNTER — Other Ambulatory Visit: Payer: Self-pay | Admitting: Family Medicine

## 2016-05-25 ENCOUNTER — Other Ambulatory Visit: Payer: Self-pay | Admitting: Family Medicine

## 2016-06-02 ENCOUNTER — Other Ambulatory Visit: Payer: Self-pay | Admitting: Family Medicine

## 2016-06-02 NOTE — Telephone Encounter (Signed)
Ok six mo worth 

## 2016-06-23 ENCOUNTER — Other Ambulatory Visit: Payer: Self-pay | Admitting: Family Medicine

## 2016-07-08 ENCOUNTER — Other Ambulatory Visit: Payer: Self-pay | Admitting: Family Medicine

## 2016-07-22 ENCOUNTER — Other Ambulatory Visit: Payer: Self-pay | Admitting: Family Medicine

## 2016-09-19 LAB — HM DIABETES EYE EXAM

## 2016-09-22 ENCOUNTER — Encounter: Payer: Self-pay | Admitting: Family Medicine

## 2016-09-23 ENCOUNTER — Telehealth: Payer: Self-pay | Admitting: Family Medicine

## 2016-09-23 NOTE — Telephone Encounter (Signed)
Review screening mammogram results in red folder.

## 2016-09-28 ENCOUNTER — Ambulatory Visit (INDEPENDENT_AMBULATORY_CARE_PROVIDER_SITE_OTHER): Payer: Medicare Other | Admitting: Family Medicine

## 2016-09-28 ENCOUNTER — Encounter: Payer: Self-pay | Admitting: Family Medicine

## 2016-09-28 VITALS — BP 138/74 | Temp 98.5°F | Ht 63.5 in | Wt 187.0 lb

## 2016-09-28 DIAGNOSIS — E1165 Type 2 diabetes mellitus with hyperglycemia: Secondary | ICD-10-CM

## 2016-09-28 DIAGNOSIS — I10 Essential (primary) hypertension: Secondary | ICD-10-CM | POA: Diagnosis not present

## 2016-09-28 DIAGNOSIS — Z79899 Other long term (current) drug therapy: Secondary | ICD-10-CM

## 2016-09-28 DIAGNOSIS — Z23 Encounter for immunization: Secondary | ICD-10-CM | POA: Diagnosis not present

## 2016-09-28 DIAGNOSIS — E7849 Other hyperlipidemia: Secondary | ICD-10-CM

## 2016-09-28 DIAGNOSIS — H6120 Impacted cerumen, unspecified ear: Secondary | ICD-10-CM

## 2016-09-28 DIAGNOSIS — E784 Other hyperlipidemia: Secondary | ICD-10-CM

## 2016-09-28 NOTE — Progress Notes (Signed)
   Subjective:    Patient ID: Christie [name], female    DOB: Sep 30, 1946, 70 y.o.   MRN: [id number]  Otalgia   There is pain in both ears. This is a new problem. Episode onset: 2 days ago. Associated symptoms include headaches. She has tried nothing for the symptoms.   Pt would like to get flu vaccine today.   Pt wants to get blood work ordered.  She denies high fever chills sinus pressure pain discomfort some head congestion and sneezing  Review of Systems  HENT: Positive for ear pain.   Neurological: Positive for headaches.   Patient relates difficulty hearing but she does not one hearing aids    Objective:   Physical Exam Lungs clear heart regular right eardrum hard to tell if there is fluid behind it due to previous surgery left eardrum cerumen impaction       Assessment & Plan:  Cerumen impaction left side and use Possible eustachian tube dysfunction right side Allergies loratadine when necessary Comprehensive lab work follow-up for medical send check within the next 30 days

## 2016-09-30 ENCOUNTER — Other Ambulatory Visit: Payer: Self-pay | Admitting: Family Medicine

## 2016-10-04 ENCOUNTER — Encounter: Payer: Self-pay | Admitting: Family Medicine

## 2016-10-07 ENCOUNTER — Other Ambulatory Visit: Payer: Self-pay | Admitting: Family Medicine

## 2016-10-11 ENCOUNTER — Other Ambulatory Visit: Payer: Self-pay | Admitting: Family Medicine

## 2016-10-18 LAB — LIPID PANEL
Chol/HDL Ratio: 2.5 ratio units (ref 0.0–4.4)
Cholesterol, Total: 168 mg/dL (ref 100–199)
HDL: 68 mg/dL (ref >39)
LDL Calculated: 70 mg/dL (ref 0–99)
TRIGLYCERIDES: 149 mg/dL (ref 0–149)
VLDL CHOLESTEROL CAL: 30 mg/dL (ref 5–40)

## 2016-10-18 LAB — HEPATIC FUNCTION PANEL
ALK PHOS: 116 IU/L (ref 39–117)
ALT: 25 IU/L (ref 0–32)
AST: 22 IU/L (ref 0–40)
Albumin: 4.6 g/dL (ref 3.5–4.8)
BILIRUBIN, DIRECT: 0.19 mg/dL (ref 0.00–0.40)
Bilirubin Total: 0.6 mg/dL (ref 0.0–1.2)
TOTAL PROTEIN: 6.5 g/dL (ref 6.0–8.5)

## 2016-10-18 LAB — BASIC METABOLIC PANEL
BUN / CREAT RATIO: 30 — AB (ref 12–28)
BUN: 18 mg/dL (ref 8–27)
CHLORIDE: 99 mmol/L (ref 96–106)
CO2: 25 mmol/L (ref 18–29)
CREATININE: 0.6 mg/dL (ref 0.57–1.00)
Calcium: 9.5 mg/dL (ref 8.7–10.3)
GFR calc Af Amer: 105 mL/min/{1.73_m2} (ref >59)
GFR calc non Af Amer: 91 mL/min/{1.73_m2} (ref >59)
GLUCOSE: 128 mg/dL — AB (ref 65–99)
POTASSIUM: 4.3 mmol/L (ref 3.5–5.2)
SODIUM: 140 mmol/L (ref 134–144)

## 2016-10-18 LAB — HEMOGLOBIN A1C
ESTIMATED AVERAGE GLUCOSE: 146 mg/dL
HEMOGLOBIN A1C: 6.7 % — AB (ref 4.8–5.6)

## 2016-10-18 LAB — MICROALBUMIN / CREATININE URINE RATIO
Creatinine, Urine: 25.3 mg/dL
Microalbumin, Urine: <3 ug/mL

## 2016-10-25 ENCOUNTER — Encounter: Payer: Self-pay | Admitting: Family Medicine

## 2016-10-26 ENCOUNTER — Ambulatory Visit (INDEPENDENT_AMBULATORY_CARE_PROVIDER_SITE_OTHER): Payer: Medicare Other | Admitting: Family Medicine

## 2016-10-26 ENCOUNTER — Encounter: Payer: Self-pay | Admitting: Family Medicine

## 2016-10-26 ENCOUNTER — Telehealth: Payer: Self-pay | Admitting: Family Medicine

## 2016-10-26 VITALS — BP 138/76 | Ht 63.5 in | Wt 187.2 lb

## 2016-10-26 DIAGNOSIS — R011 Cardiac murmur, unspecified: Secondary | ICD-10-CM | POA: Diagnosis not present

## 2016-10-26 DIAGNOSIS — E119 Type 2 diabetes mellitus without complications: Secondary | ICD-10-CM | POA: Diagnosis not present

## 2016-10-26 DIAGNOSIS — I10 Essential (primary) hypertension: Secondary | ICD-10-CM

## 2016-10-26 DIAGNOSIS — E784 Other hyperlipidemia: Secondary | ICD-10-CM

## 2016-10-26 DIAGNOSIS — E7849 Other hyperlipidemia: Secondary | ICD-10-CM

## 2016-10-26 MED ORDER — EMPAGLIFLOZIN 10 MG PO TABS: 10.0000 mg | ORAL_TABLET | Freq: Every day | ORAL | 5 refills | Status: DC

## 2016-10-26 MED ORDER — LOSARTAN POTASSIUM-HCTZ 100-12.5 MG PO TABS: 1.0000 | ORAL_TABLET | Freq: Every day | ORAL | 5 refills | Status: DC

## 2016-10-26 MED ORDER — ALPRAZOLAM 0.5 MG PO TABS: ORAL_TABLET | ORAL | 5 refills | Status: DC

## 2016-10-26 MED ORDER — ROSUVASTATIN CALCIUM 20 MG PO TABS: 20.0000 mg | ORAL_TABLET | Freq: Every day | ORAL | 5 refills | Status: DC

## 2016-10-26 NOTE — Telephone Encounter (Signed)
ERROR

## 2016-10-26 NOTE — Progress Notes (Signed)
   Subjective:    Patient ID: Christie [name], female    DOB: [birth year], 70 y.o.   MRN: [id number]  Diabetes  She presents for her follow-up diabetic visit. She has type 2 diabetes mellitus. No MedicAlert identification noted. Eye exam is current.  Very nice patient who comes in today for diabetes. She has not been watching her diet she has not been pain attention to exercise. Surprisingly her A1c is under fairly decent control She denies excessive hyperglycemia no low sugar spells. Patient had blood work drawn on 10/17/16. She states her moods are doing good overall. Also in addition to this she has been taking Invokana She denies any side effects from She does take her cholesterol medicine blood pressure medicine She states she can do better with watching her diet States no other concerns this visit.    Review of Systems Currently denies numbness in the feet fever chills tingling shortness of breath chest pressure vomiting diarrhea    Objective:   Physical Exam  HEENT benign neck no masses no bruits. Lungs are clear. Murmur noted when listening to upper chests. Worse over the aortic outflow 2 out of 6 Extremities no edema diabetic foot exam normal      Assessment & Plan:  Diabetes decent control continue current measures but do better job watching diet staying physically active. Stopped in [place] use [place]. Keep A1c below 7 if possible follow-up within 4 months Hypertension decent control continue current measures Hyperlipidemia lab work looks good continue current measures heart murmur referral to cardiology will need echo to look at her valves. Patient will follow-up within 5 months.

## 2016-10-31 ENCOUNTER — Ambulatory Visit: Payer: Medicare Other | Admitting: Internal Medicine

## 2016-11-02 ENCOUNTER — Encounter: Payer: Self-pay | Admitting: Cardiovascular Disease

## 2016-11-02 ENCOUNTER — Ambulatory Visit (INDEPENDENT_AMBULATORY_CARE_PROVIDER_SITE_OTHER): Payer: Medicare Other | Admitting: Cardiovascular Disease

## 2016-11-02 VITALS — BP 126/70 | HR 85 | Ht 63.5 in | Wt 186.0 lb

## 2016-11-02 DIAGNOSIS — R011 Cardiac murmur, unspecified: Secondary | ICD-10-CM

## 2016-11-02 DIAGNOSIS — I10 Essential (primary) hypertension: Secondary | ICD-10-CM

## 2016-11-02 NOTE — Patient Instructions (Signed)
Medication Instructions:  Your physician recommends that you continue on your current medications as directed. Please refer to the Current Medication list given to you today.   Labwork: NONE  Testing/Procedures: Your physician has requested that you have an echocardiogram. Echocardiography is a painless test that uses sound waves to create images of your heart. It provides your doctor with information about the size and shape of your heart and how well your heart's chambers and valves are working. This procedure takes approximately one hour. There are no restrictions for this procedure.    Follow-Up: Your physician recommends that you schedule a follow-up appointment in: AS NEEDED    Any Other Special Instructions Will Be Listed Below (If Applicable).     If you need a refill on your cardiac medications before your next appointment, please call your pharmacy.   

## 2016-11-02 NOTE — Progress Notes (Addendum)
Cardiology Office Note   Date:  11/02/2016   ID:  Christie [name], Christie [name] 01/18/1946, MRN [id number]  PCP:  Sallee Lange, MD  Cardiologist:   Jenkins Rouge, MD   No chief complaint on file.     History of Present Illness: Christie [name] is a 70 y.o. female who presents for evaluation of murmur.  History of DM last A1c 6.7 , HTN  And elevated lipids on Rx.  She has seen Dr Wolfgang Phoenix for years and not had murmur. She has no undo dyspnea chest pain palpitations No history of rheumatic fever Diet pill use No history of SBE or undiagnosed feveres No connective tissue disease or vascular disease. Has not had a stress test In years Daughter describes her as sedentary Has problems avoiding carbs     Past Medical History:  Diagnosis Date  . 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 Chronic kidney disease 11/2008   stones  . DM (diabetes mellitus) ([place]) 2012  . Elevated liver enzymes   . Hyperlipidemia   . Hypertension 2000  . Spastic colon     Past Surgical History:  Procedure Laterality Date  . BACK SURGERY  2004  . CHOLECYSTECTOMY    . COLONOSCOPY  feb 2005  . COLONOSCOPY N/A 02/13/2014   Procedure: COLONOSCOPY;  Surgeon: Rogene Houston, MD;  Location: [facility];  Service: Endoscopy;  Laterality: N/A;  930-moved to 100 Ann notified pt  . ESOPHAGOGASTRODUODENOSCOPY  09/2005  . EYE SURGERY Right    catatract removed  . EYE SURGERY Right    macular   surgery   . STAPEDECTOMY Bilateral    Right 1991 and Left 1994  . TOTAL ABDOMINAL HYSTERECTOMY  1982     Current Outpatient Prescriptions  Medication Sig Dispense Refill  . ACCU-CHEK AVIVA PLUS test strip USE TO TEST BLOOD SUGAR ONCE DAILY. 50 each 5  . ACCU-CHEK SOFTCLIX LANCETS lancets     . ALPRAZolam (XANAX) 0.5 MG tablet TAKE 1 TABLET BY MOUTH AT BEDTIME FOR SLEEP. 30 tablet 5  . aspirin 81 MG tablet Take 81 mg by mouth daily.    . cholecalciferol (VITAMIN D) 1000 UNITS tablet Take 1,000 Units by mouth daily.    .  citalopram (CELEXA) 20 MG tablet TAKE ONE TABLET BY MOUTH ONCE DAILY. 30 tablet 5  . empagliflozin (JARDIANCE) 10 MG TABS tablet Take 10 mg by mouth daily. 30 tablet 5  . glipiZIDE (GLUCOTROL) 5 MG tablet TAKE (2) TABLETS BY MOUTH TWICE DAILY. 120 tablet 5  . losartan-hydrochlorothiazide (HYZAAR) 100-12.5 MG tablet Take 1 tablet by mouth daily. 30 tablet 5  . Multiple Vitamins-Minerals (CENTRUM SILVER PO) Take by mouth.    . Multiple Vitamins-Minerals (MACULAR VITAMIN BENEFIT PO) Take 1 tablet by mouth daily.    . Probiotic Product (HEALTHY COLON PO) Take by mouth. One qam    . rosuvastatin (CRESTOR) 20 MG tablet Take 1 tablet (20 mg total) by mouth daily. 30 tablet 5   No current facility-administered medications for this visit.     Allergies:   Ace inhibitors; Lipitor [atorvastatin]; Metformin and related; and Percocet [oxycodone-acetaminophen]    Social History:  The patient  reports that she has never smoked. She has never used smokeless tobacco. She reports that she does not drink alcohol or use drugs.   Family History:  The patient's family history includes Diabetes in her father.    ROS:  Please see the history of present illness.  Otherwise, review of systems are positive for none.   All other systems are reviewed and negative.    PHYSICAL EXAM: VS:  BP 126/70   Pulse 85   Ht 5' 3.5" (1.613 m)   Wt 84.4 kg (186 lb)   PF 95 L/min   BMI 32.43 kg/m  , BMI Body mass index is 32.43 kg/m. Affect appropriate Healthy:  appears stated age 78: normal Neck supple with no adenopathy JVP normal no bruits no thyromegaly Lungs clear with no wheezing and good diaphragmatic motion Heart:  S1/S2 SEM 2/6  murmur, no rub, gallop or click PMI normal Abdomen: benighn, BS positve, no tenderness, no AAA no bruit.  No HSM or HJR Distal pulses intact with no bruits No edema Neuro non-focal Skin warm and dry No muscular weakness    EKG:  SR rate 78 low voltage nonspecific lateral T  wave changes    Recent Labs: 10/17/2016: ALT 25; BUN 18; Creatinine, Ser 0.60; Potassium 4.3; Sodium 140    Lipid Panel    Component Value Date/Time   CHOL 168 10/17/2016 0804   TRIG 149 10/17/2016 0804   HDL 68 10/17/2016 0804   CHOLHDL 2.5 10/17/2016 0804   CHOLHDL 2.7 09/23/2014 0703   VLDL 26 09/23/2014 0703   LDLCALC 70 10/17/2016 0804      Wt Readings from Last 3 Encounters:  11/02/16 84.4 kg (186 lb)  10/26/16 84.9 kg (187 lb 4 oz)  09/28/16 84.8 kg (187 lb)      Other studies Reviewed: Additional studies/ records that were reviewed today include: Notes Dr Wolfgang Phoenix ECG and labs .    ASSESSMENT AND PLAN:  1.  Murmur:  AV sclerosis murmur f/u echo not likely significant no need for SBE 2. HTN;  Well controlled.  Continue current medications and low sodium Dash type diet.   3. DM:  Discussed low carb diet.  Target hemoglobin A1c is 6.5 or less.  Continue current medications. 4 Chol: on statin  5. CAD risk with long standing DM suggested she have stress test every 3 years Dr Wolfgang Phoenix to arrange if he wants Recommend imaging with test like Myovue or Ech due to baseline ECG changes    Current medicines are reviewed at length with the patient today.  The patient does not have concerns regarding medicines.  The following changes have been made:  no change  Labs/ tests ordered today include:  Echo  Orders Placed This Encounter  Procedures  . EKG 12-Lead  . ECHOCARDIOGRAM COMPLETE  . ECHOCARDIOGRAM COMPLETE     Disposition:   FU with [place] PRN      Signed, Jenkins Rouge, MD  11/02/2016 2:14 PM    [facility] [address], [place], [place]  [address] Phone: [phone]; Fax: [phone]

## 2016-11-14 ENCOUNTER — Ambulatory Visit (HOSPITAL_COMMUNITY)
Admission: RE | Admit: 2016-11-14 | Discharge: 2016-11-14 | Disposition: A | Discharge status: Home / Self Care | Payer: Medicare Other | Source: Ambulatory Visit | Attending: Cardiovascular Disease | Admitting: Cardiovascular Disease

## 2016-11-14 ENCOUNTER — Encounter: Payer: Self-pay | Admitting: Family Medicine

## 2016-11-14 DIAGNOSIS — I350 Nonrheumatic aortic (valve) stenosis: Secondary | ICD-10-CM | POA: Insufficient documentation

## 2016-11-14 DIAGNOSIS — Z8589 Personal history of malignant neoplasm of other organs and systems: Secondary | ICD-10-CM | POA: Diagnosis not present

## 2016-11-14 DIAGNOSIS — E119 Type 2 diabetes mellitus without complications: Secondary | ICD-10-CM | POA: Insufficient documentation

## 2016-11-14 DIAGNOSIS — R011 Cardiac murmur, unspecified: Secondary | ICD-10-CM | POA: Insufficient documentation

## 2016-11-14 DIAGNOSIS — I071 Rheumatic tricuspid insufficiency: Secondary | ICD-10-CM | POA: Diagnosis not present

## 2016-11-14 DIAGNOSIS — I10 Essential (primary) hypertension: Secondary | ICD-10-CM | POA: Insufficient documentation

## 2016-11-14 DIAGNOSIS — E785 Hyperlipidemia, unspecified: Secondary | ICD-10-CM | POA: Diagnosis not present

## 2016-11-14 NOTE — Progress Notes (Signed)
*  PRELIMINARY RESULTS* Echocardiogram 2D Echocardiogram has been performed.  Christie [name] 11/14/2016, 2:57 PM

## 2016-11-17 ENCOUNTER — Telehealth: Payer: Self-pay | Admitting: Family Medicine

## 2016-11-17 NOTE — Telephone Encounter (Signed)
Continue current medicines, patient should be seen this evening. If she is unable to come today she will need to be seen on Friday.

## 2016-11-17 NOTE — Telephone Encounter (Signed)
Pt called wanting to speak with a nurse. Pt states that her blood pressure is higher than normal. Pt states that her last reading was 160/80. Pt states that she had an echo done the other day and it was 190 over 80. Please advise.

## 2016-11-17 NOTE — Telephone Encounter (Signed)
Left message return call 11/17/16 

## 2016-11-17 NOTE — Telephone Encounter (Signed)
Spoke with patient and patient stated that her blood pressure had been elevated the past few days compared to normal. Patient stated that her blood pressure has been 190/80 and 160/80. She has some c/o dizziness and headache. Please advise?

## 2016-11-18 ENCOUNTER — Ambulatory Visit (INDEPENDENT_AMBULATORY_CARE_PROVIDER_SITE_OTHER): Payer: Medicare Other | Admitting: Family Medicine

## 2016-11-18 ENCOUNTER — Encounter: Payer: Self-pay | Admitting: Family Medicine

## 2016-11-18 VITALS — BP 150/80 | Ht 63.5 in | Wt 187.0 lb

## 2016-11-18 DIAGNOSIS — I10 Essential (primary) hypertension: Secondary | ICD-10-CM | POA: Diagnosis not present

## 2016-11-18 NOTE — Progress Notes (Signed)
   Subjective:    Patient ID: Christie [name], female    DOB: 24-Nov-1946, 70 y.o.   MRN: [id number]  Hypertension  This is a new problem. The current episode started in the past 7 days. Associated symptoms include headaches. (Lightheadedness) There are no associated agents to hypertension. There are no known risk factors for coronary artery disease. Past treatments include nothing. There are no compliance problems.    Patient has no other concerns at this time.   Patient just started checking her own blood pressure for the first time ever this week. Uses daughter cuff. This is regular sized and not large. Review of Systems  Neurological: Positive for headaches.       Objective:   Physical Exam  150/72 on repeat right arm 148/70 left arm. HEENT normal lungs clear heart regular in rhythm.      Assessment & Plan:  Impression hypertension suboptimal control but too early to change based on just one blood pressure level plan get cuff with large cuff. Options discussed. Recheck with Dr. Nicki Reaper in several weeks. Maintain current meds WSL

## 2016-11-18 NOTE — Telephone Encounter (Signed)
Spoke with patient and informed her per Dr.Scott [name]- continue on current medicines, should be seen on today. Patient verbalized understanding and stated that she has an appointment for Monday and may not be able to come in today depending on the time. Transferred patient to front desk to scheduled office visit.

## 2016-11-18 NOTE — Patient Instructions (Addendum)
lifesource bp cuffs and machine  Make sure the cuff is sized for a large upper arm  Right arm 15o over 72,   And left arn 148 over 70

## 2016-11-21 ENCOUNTER — Ambulatory Visit: Payer: Medicare Other | Admitting: Family Medicine

## 2016-11-29 IMAGING — US US CAROTID DUPLEX BILAT
1 series · 13 of 24 positions shown · non-contrast
Comparison: None.

CLINICAL DATA: Left carotid bruit.

EXAM:
BILATERAL CAROTID DUPLEX ULTRASOUND
TECHNIQUE: Gray scale imaging, color Doppler and duplex ultrasound were
performed of bilateral carotid and vertebral arteries in the neck.

[Series 1: us carotid duplex bilat · 0.06mm/px · 13 of 68 slices shown]
[im 1/68]
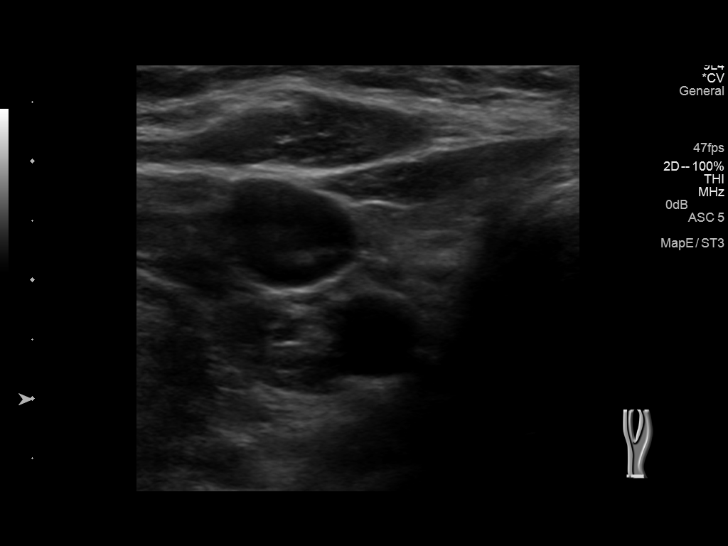
[im 6/68]
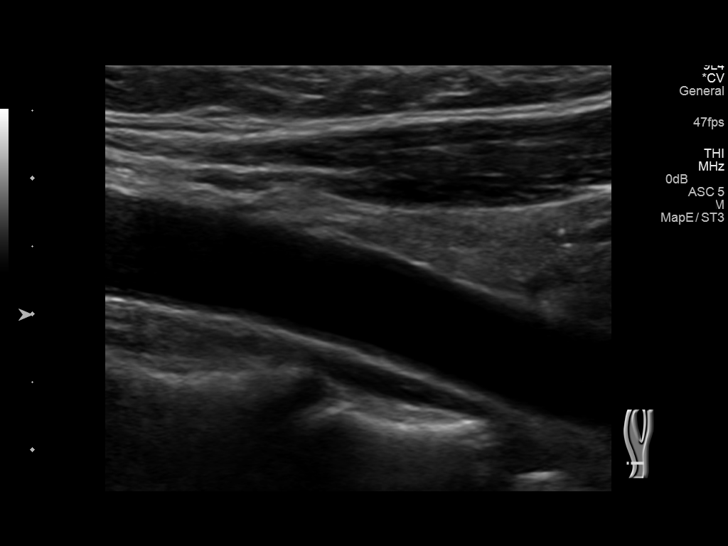
[im 12/68]
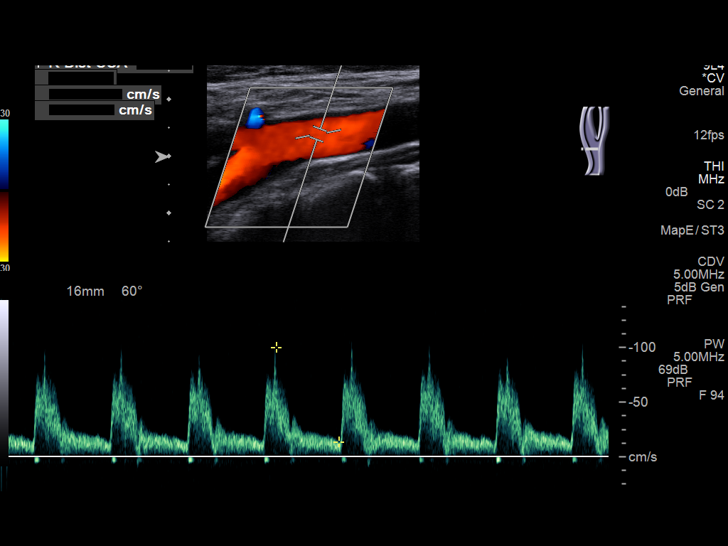
[im 18/68]
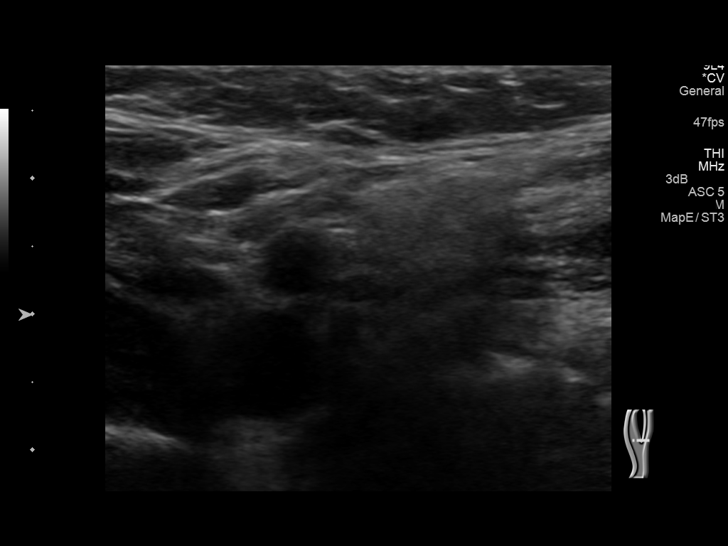
[im 24/68]
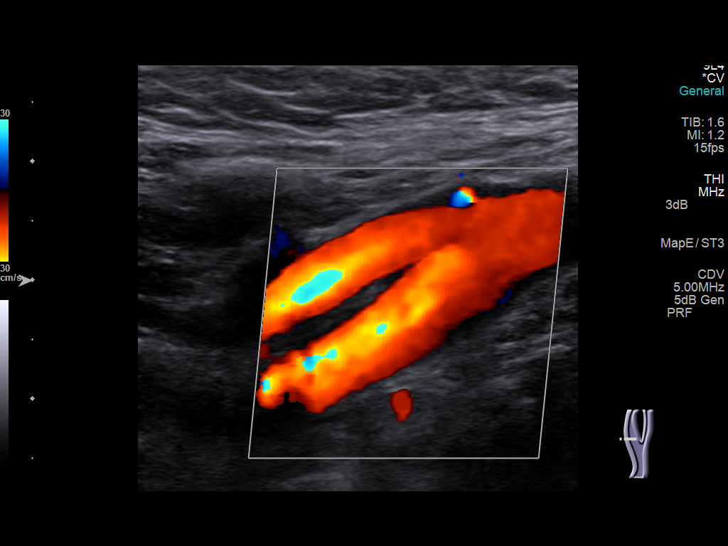
[im 30/68]
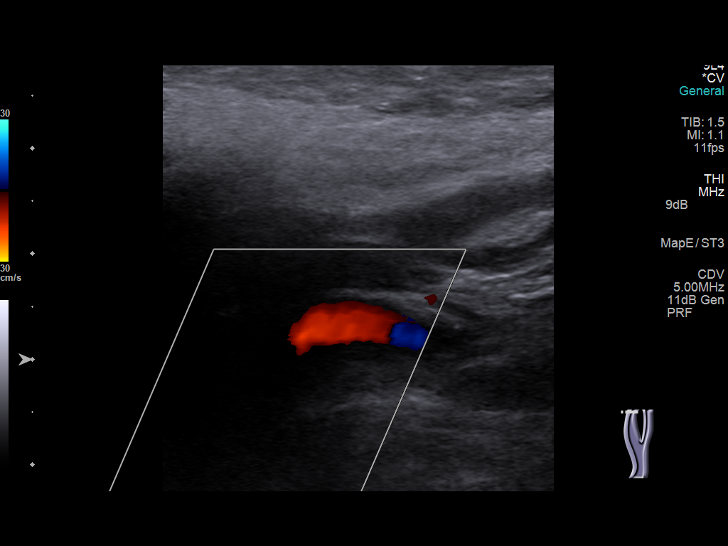
[im 35/68]
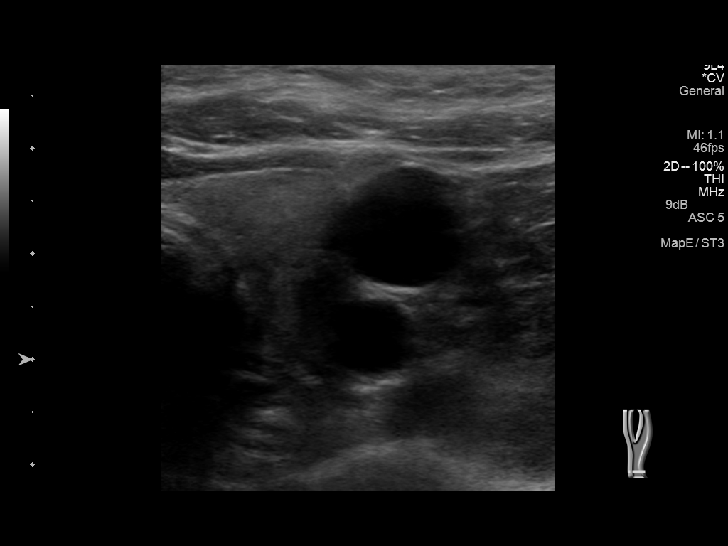
[im 38/68]
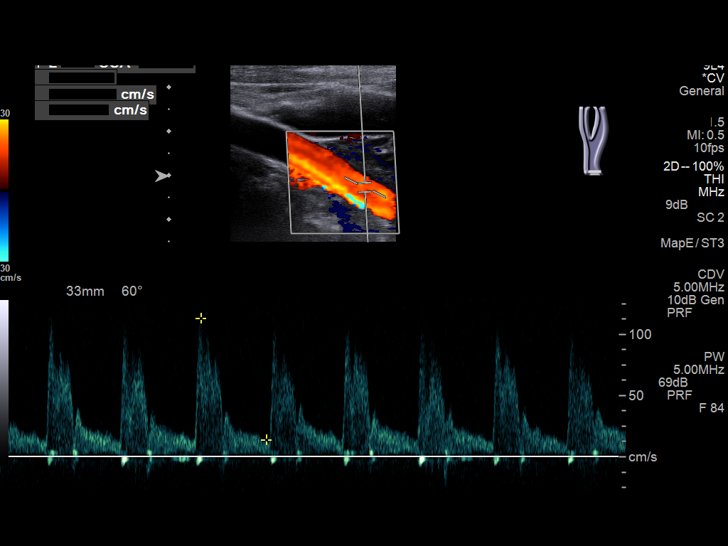
[im 44/68]
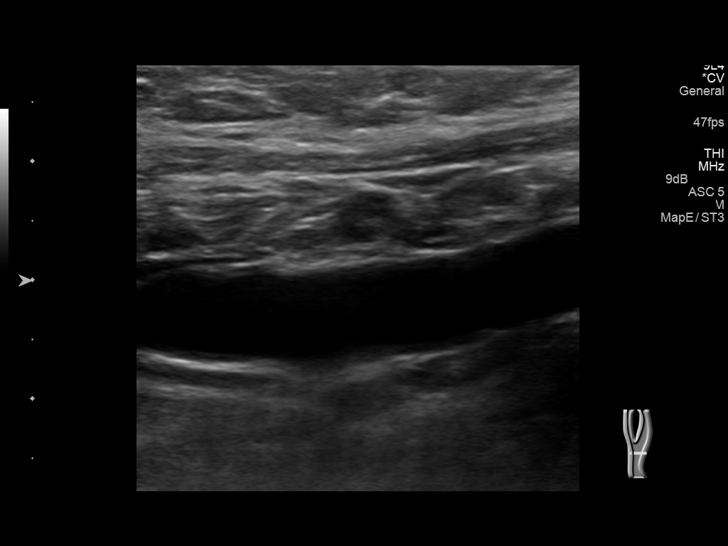
[im 50/68]
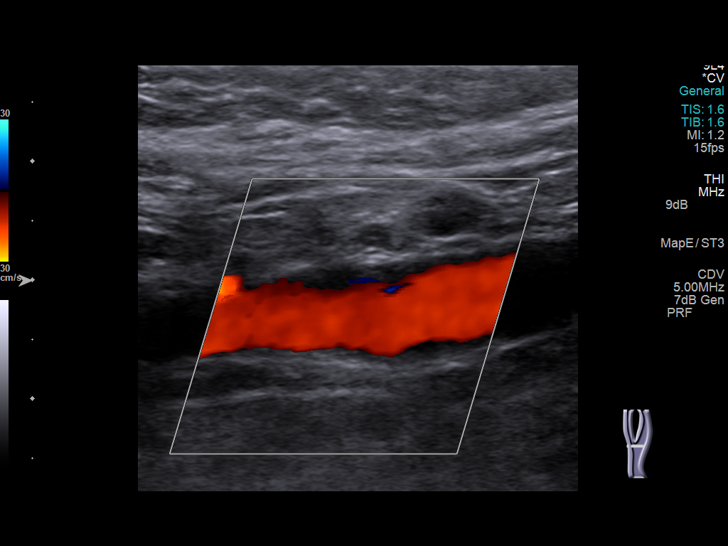
[im 56/68]
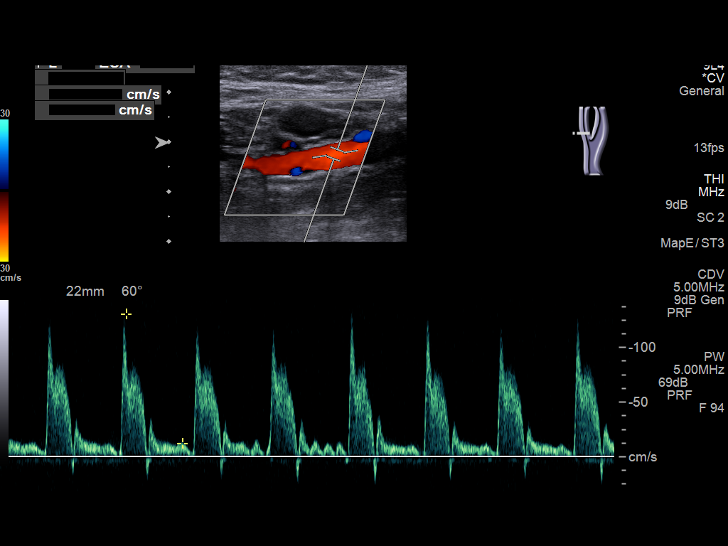
[im 62/68]
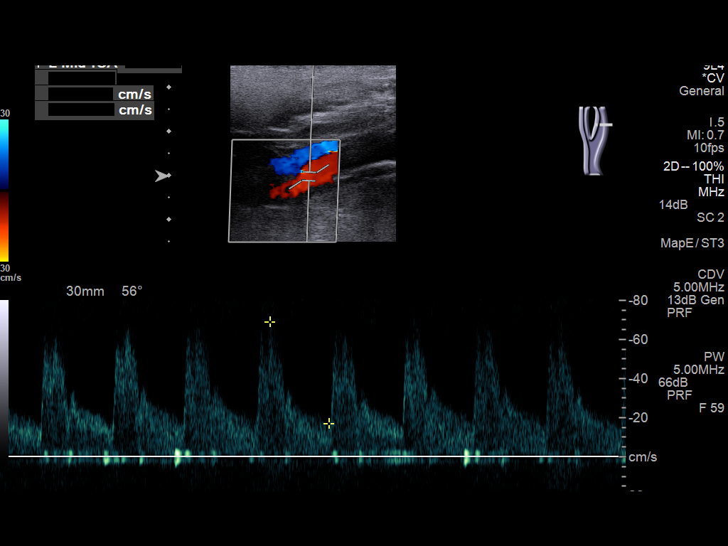
[im 68/68]
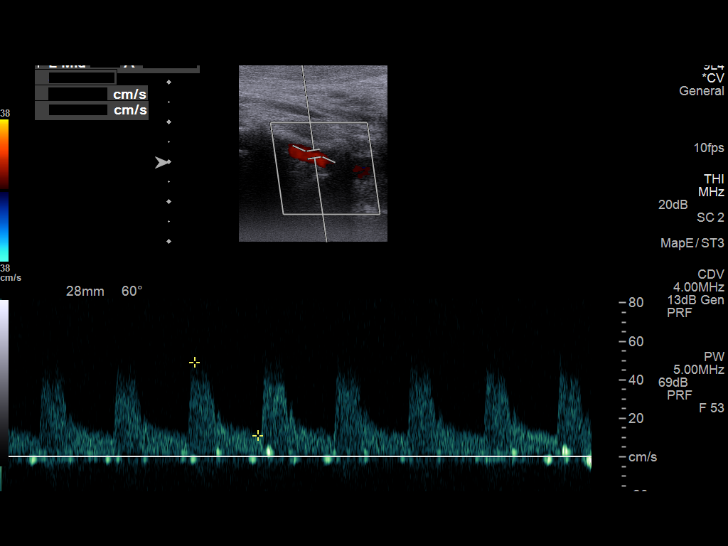

[13 of 24 positions shown; findings below may reference images not displayed]

FINDINGS: Criteria: Quantification of carotid stenosis is based on velocity
parameters that correlate the residual internal carotid diameter
with NASCET-based stenosis levels, using the diameter of the distal
internal carotid lumen as the denominator for stenosis measurement.

The following velocity measurements were obtained:

RIGHT

ICA:  104/25 cm/sec

CCA:  118/17 cm/sec

SYSTOLIC ICA/CCA RATIO:

DIASTOLIC ICA/CCA RATIO:

ECA:  113 cm/sec

LEFT

ICA:  69/17 cm/sec

CCA:  96/16 cm/sec

SYSTOLIC ICA/CCA RATIO:

DIASTOLIC ICA/CCA RATIO:

ECA:  131 cm/sec

RIGHT CAROTID ARTERY: No significant plaque or stenosis is noted.
Minimal intimal thickening is noted.

RIGHT VERTEBRAL ARTERY:  Antegrade flow is noted.

LEFT CAROTID ARTERY: No significant plaque or stenosis is noted.
Minimal intimal thickening is noted.

LEFT VERTEBRAL ARTERY:  Antegrade flow is noted.
IMPRESSION: No hemodynamically significant stenosis or plaque is noted in either
cervical carotid artery.

## 2016-12-01 ENCOUNTER — Ambulatory Visit (INDEPENDENT_AMBULATORY_CARE_PROVIDER_SITE_OTHER): Payer: Medicare Other | Admitting: Family Medicine

## 2016-12-01 ENCOUNTER — Encounter: Payer: Self-pay | Admitting: Family Medicine

## 2016-12-01 VITALS — BP 158/86 | HR 73 | Ht 64.0 in | Wt 185.0 lb

## 2016-12-01 DIAGNOSIS — I10 Essential (primary) hypertension: Secondary | ICD-10-CM | POA: Diagnosis not present

## 2016-12-01 MED ORDER — LOSARTAN POTASSIUM-HCTZ 100-25 MG PO TABS: 1.0000 | ORAL_TABLET | Freq: Every day | ORAL | 5 refills | Status: DC

## 2016-12-01 NOTE — Patient Instructions (Signed)
DASH Eating Plan DASH stands for "Dietary Approaches to Stop Hypertension." The DASH eating plan is a healthy eating plan that has been shown to reduce high blood pressure (hypertension). Additional health benefits may include reducing the risk of type 2 diabetes mellitus, heart disease, and stroke. The DASH eating plan may also help with weight loss. What do I need to know about the DASH eating plan? For the DASH eating plan, you will follow these general guidelines:  Choose foods with less than 150 milligrams of sodium per serving (as listed on the food label).  Use salt-free seasonings or herbs instead of table salt or sea salt.  Check with your health care provider or pharmacist before using salt substitutes.  Eat lower-sodium products. These are often labeled as "low-sodium" or "no salt added."  Eat fresh foods. Avoid eating a lot of canned foods.  Eat more vegetables, fruits, and low-fat dairy products.  Choose whole grains. Look for the word "whole" as the first word in the ingredient list.  Choose fish and skinless chicken or turkey more often than red meat. Limit fish, poultry, and meat to 6 oz (170 g) each day.  Limit sweets, desserts, sugars, and sugary drinks.  Choose heart-healthy fats.  Eat more home-cooked food and less restaurant, buffet, and fast food.  Limit fried foods.  Do not fry foods. Cook foods using methods such as baking, boiling, grilling, and broiling instead.  When eating at a restaurant, ask that your food be prepared with less salt, or no salt if possible. What foods can I eat? Seek help from a dietitian for individual calorie needs. Grains  Whole grain or whole wheat bread. Brown rice. Whole grain or whole wheat pasta. Quinoa, bulgur, and whole grain cereals. Low-sodium cereals. Corn or whole wheat flour tortillas. Whole grain cornbread. Whole grain crackers. Low-sodium crackers. Vegetables  Fresh or frozen vegetables (raw, steamed, roasted, or  grilled). Low-sodium or reduced-sodium tomato and vegetable juices. Low-sodium or reduced-sodium tomato sauce and paste. Low-sodium or reduced-sodium canned vegetables. Fruits  All fresh, canned (in natural juice), or frozen fruits. Meat and Other Protein Products  Ground beef (85% or leaner), grass-fed beef, or beef trimmed of fat. Skinless chicken or turkey. Ground chicken or turkey. Pork trimmed of fat. All fish and seafood. Eggs. Dried beans, peas, or lentils. Unsalted nuts and seeds. Unsalted canned beans. Dairy  Low-fat dairy products, such as skim or 1% milk, 2% or reduced-fat cheeses, low-fat ricotta or cottage cheese, or plain low-fat yogurt. Low-sodium or reduced-sodium cheeses. Fats and Oils  Tub margarines without trans fats. Light or reduced-fat mayonnaise and salad dressings (reduced sodium). Avocado. Safflower, olive, or canola oils. Natural peanut or almond butter. Other  Unsalted popcorn and pretzels. The items listed above may not be a complete list of recommended foods or beverages. Contact your dietitian for more options.  What foods are not recommended? Grains  White bread. White pasta. White rice. Refined cornbread. Bagels and croissants. Crackers that contain trans fat. Vegetables  Creamed or fried vegetables. Vegetables in a cheese sauce. Regular canned vegetables. Regular canned tomato sauce and paste. Regular tomato and vegetable juices. Fruits  Canned fruit in light or heavy syrup. Fruit juice. Meat and Other Protein Products  Fatty cuts of meat. Ribs, chicken wings, bacon, sausage, bologna, salami, chitterlings, fatback, hot dogs, bratwurst, and packaged luncheon meats. Salted nuts and seeds. Canned beans with salt. Dairy  Whole or 2% milk, cream, half-and-half, and cream cheese. Whole-fat or sweetened yogurt. Full-fat cheeses   or blue cheese. Nondairy creamers and whipped toppings. Processed cheese, cheese spreads, or cheese curds. Condiments  Onion and garlic  salt, seasoned salt, table salt, and sea salt. Canned and packaged gravies. Worcestershire sauce. Tartar sauce. Barbecue sauce. Teriyaki sauce. Soy sauce, including reduced sodium. Steak sauce. Fish sauce. Oyster sauce. Cocktail sauce. Horseradish. Ketchup and mustard. Meat flavorings and tenderizers. Bouillon cubes. Hot sauce. Tabasco sauce. Marinades. Taco seasonings. Relishes. Fats and Oils  Butter, stick margarine, lard, shortening, ghee, and bacon fat. Coconut, palm kernel, or palm oils. Regular salad dressings. Other  Pickles and olives. Salted popcorn and pretzels. The items listed above may not be a complete list of foods and beverages to avoid. Contact your dietitian for more information.  Where can I find more information? National Heart, Lung, and Blood Institute: www.nhlbi.nih.gov/health/health-topics/topics/dash/ This information is not intended to replace advice given to you by your health care provider. Make sure you discuss any questions you have with your health care provider. Document Released: 11/24/2011 Document Revised: 05/12/2016 Document Reviewed: 10/09/2013 Elsevier Interactive Patient Education  2017 Elsevier Inc.  

## 2016-12-01 NOTE — Progress Notes (Signed)
   Subjective:    Patient ID: Christie [name], female    DOB: 07-Oct-1946, 70 y.o.   MRN: [id number]  Hypertension  This is a chronic problem. Pertinent negatives include no chest pain, headaches or shortness of breath. Treatments tried: losartan- hctz.  She is watching her diet not doing much in way walking or exercise Pt brought in bp readings.  Pt states no other concerns today.  She brought in her blood pressure readings they were checked with our unit as well as her units they they closely correlate  Review of Systems  Constitutional: Negative for activity change, fatigue and fever.  Respiratory: Negative for cough and shortness of breath.   Cardiovascular: Negative for chest pain and leg swelling.  Neurological: Negative for headaches.       Objective:   Physical Exam  Constitutional: She appears well-nourished. No distress.  Cardiovascular: Normal rate, regular rhythm and normal heart sounds.   No murmur heard. Pulmonary/Chest: Effort normal and breath sounds normal. No respiratory distress.  Musculoskeletal: She exhibits no edema.  Lymphadenopathy:    She has no cervical adenopathy.  Neurological: She is alert. She exhibits normal muscle tone.  Psychiatric: Her behavior is normal.  Vitals reviewed.         Assessment & Plan:  Blood pressure cuff correlates with our readings  Low-sodium diet  Increase physical activity walking  Adjust blood pressure medicine increase the component 25 mg  Follow-up within 6 months wellness physical diabetic follow-up lab work

## 2016-12-29 ENCOUNTER — Telehealth: Payer: Self-pay | Admitting: Family Medicine

## 2016-12-29 NOTE — Telephone Encounter (Signed)
Pt dropped off blood pressure readings. Please see red folder in office.

## 2017-01-01 NOTE — Telephone Encounter (Signed)
Blood pressure is higher than what we would like to see it. I recommend clonidine 0.1 mg, one taken twice daily a.m., p.m. Send [place] blood pressure readings in several weeks. If any problems with the medicine let us know. Continue other medicines. #60 one twice a day as directed 4 refills, keep follow-up late spring

## 2017-01-02 MED ORDER — CLONIDINE HCL 0.1 MG PO TABS: 0.1000 mg | ORAL_TABLET | Freq: Two times a day (BID) | ORAL | 4 refills | Status: DC

## 2017-01-02 NOTE — Telephone Encounter (Signed)
Notified patient blood pressure is higher than what we would like to see it. Dr. Nicki Reaper recommends clonidine 0.1 mg, one taken twice daily a.m., p.m. Send [place] blood pressure readings in several weeks. If any problems with the medicine let us know. Continue other medicines. #60 one twice a day as directed 4 refills, keep follow-up late spring. Patient verbalized understanding. Med sent to pharmacy.

## 2017-01-02 NOTE — Addendum Note (Signed)
Addended by: Jesusita Oka on: 01/02/2017 09:01 AM   Modules accepted: Orders

## 2017-01-18 ENCOUNTER — Telehealth: Payer: Self-pay | Admitting: Family Medicine

## 2017-01-18 ENCOUNTER — Ambulatory Visit (INDEPENDENT_AMBULATORY_CARE_PROVIDER_SITE_OTHER): Payer: Medicare Other | Admitting: Family Medicine

## 2017-01-18 VITALS — BP 116/78 | Wt 183.6 lb

## 2017-01-18 DIAGNOSIS — I10 Essential (primary) hypertension: Secondary | ICD-10-CM

## 2017-01-18 DIAGNOSIS — S46011A Strain of muscle(s) and tendon(s) of the rotator cuff of right shoulder, initial encounter: Secondary | ICD-10-CM

## 2017-01-18 NOTE — Telephone Encounter (Signed)
Pt is wanting to know if it is ok for her to donate blood. Please advise.

## 2017-01-18 NOTE — Telephone Encounter (Signed)
Notified patient should be fine-she may want to wait until she is off of the clonidine. Patient verbalized understanding.

## 2017-01-18 NOTE — Telephone Encounter (Signed)
Should be fine-she may want to wait until she is off of the clonidine

## 2017-01-18 NOTE — Progress Notes (Signed)
   Subjective:    Patient ID: Christie [name], female    DOB: 10-17-1946, 71 y.o.   MRN: [id number]  HPI Patient presents to office today c/o intermittent right arm pain.  She admits to falling up steps December 03 2016 and landed on right shoulder. She states she has never been seen for this injury.  She states pain increases at night and with abduction.  Pt states she started a new BP medication a couple of weeks ago. She c/o fatigue and "legs feel like rubber".   clonidine was started reently since hen she I noticed some weakness in his noticed some moderately low blood reues or her Review of Systems     She denies any high fever chills sweats nauea omiting diarrha Objective:   Physical Exam  lungs clear heart regular HEENT benign       Assessment & Plan:   HTN-stop Lon edemataper off over the next 4 days   Rotator cuff strain recmmendregular exercises printout was given if not getting significant improvement over th ext few wes referral to orthopedics she will let us know

## 2017-01-20 ENCOUNTER — Telehealth: Payer: Self-pay | Admitting: Family Medicine

## 2017-01-20 ENCOUNTER — Other Ambulatory Visit: Payer: Self-pay | Admitting: Family Medicine

## 2017-01-20 MED ORDER — CITALOPRAM HYDROBROMIDE 20 MG PO TABS: 20.0000 mg | ORAL_TABLET | Freq: Every day | ORAL | 5 refills | Status: DC

## 2017-01-20 NOTE — Telephone Encounter (Signed)
Patient requesting Rx for Citalopram.  She is hoping to have this called in today to [facility] because she will soon be out.

## 2017-01-20 NOTE — Telephone Encounter (Signed)
Notified patient refills sent to pharmacy.

## 2017-01-26 ENCOUNTER — Ambulatory Visit (INDEPENDENT_AMBULATORY_CARE_PROVIDER_SITE_OTHER): Payer: Medicare Other | Admitting: Ophthalmology

## 2017-01-26 DIAGNOSIS — I10 Essential (primary) hypertension: Secondary | ICD-10-CM | POA: Diagnosis not present

## 2017-01-26 DIAGNOSIS — E113292 Type 2 diabetes mellitus with mild nonproliferative diabetic retinopathy without macular edema, left eye: Secondary | ICD-10-CM

## 2017-01-26 DIAGNOSIS — H35033 Hypertensive retinopathy, bilateral: Secondary | ICD-10-CM | POA: Diagnosis not present

## 2017-01-26 DIAGNOSIS — E11319 Type 2 diabetes mellitus with unspecified diabetic retinopathy without macular edema: Secondary | ICD-10-CM | POA: Diagnosis not present

## 2017-01-26 DIAGNOSIS — H43812 Vitreous degeneration, left eye: Secondary | ICD-10-CM | POA: Diagnosis not present

## 2017-02-14 ENCOUNTER — Encounter: Payer: Self-pay | Admitting: Family Medicine

## 2017-02-14 ENCOUNTER — Ambulatory Visit (INDEPENDENT_AMBULATORY_CARE_PROVIDER_SITE_OTHER): Payer: Medicare Other | Admitting: Family Medicine

## 2017-02-14 ENCOUNTER — Ambulatory Visit (HOSPITAL_COMMUNITY)
Admission: RE | Admit: 2017-02-14 | Discharge: 2017-02-14 | Disposition: A | Discharge status: Home / Self Care | Payer: Medicare Other | Source: Ambulatory Visit | Attending: Family Medicine | Admitting: Family Medicine

## 2017-02-14 VITALS — BP 122/82 | Ht 64.0 in | Wt 185.2 lb

## 2017-02-14 DIAGNOSIS — M4316 Spondylolisthesis, lumbar region: Secondary | ICD-10-CM | POA: Insufficient documentation

## 2017-02-14 DIAGNOSIS — R5383 Other fatigue: Secondary | ICD-10-CM

## 2017-02-14 DIAGNOSIS — I10 Essential (primary) hypertension: Secondary | ICD-10-CM

## 2017-02-14 DIAGNOSIS — M48062 Spinal stenosis, lumbar region with neurogenic claudication: Secondary | ICD-10-CM | POA: Insufficient documentation

## 2017-02-14 DIAGNOSIS — S43421D Sprain of right rotator cuff capsule, subsequent encounter: Secondary | ICD-10-CM

## 2017-02-14 DIAGNOSIS — E119 Type 2 diabetes mellitus without complications: Secondary | ICD-10-CM | POA: Diagnosis not present

## 2017-02-14 DIAGNOSIS — M5136 Other intervertebral disc degeneration, lumbar region: Secondary | ICD-10-CM | POA: Insufficient documentation

## 2017-02-14 DIAGNOSIS — E784 Other hyperlipidemia: Secondary | ICD-10-CM | POA: Diagnosis not present

## 2017-02-14 DIAGNOSIS — E7849 Other hyperlipidemia: Secondary | ICD-10-CM

## 2017-02-14 MED ORDER — HYDROCODONE-ACETAMINOPHEN 5-325 MG PO TABS: ORAL_TABLET | ORAL | 0 refills | Status: DC

## 2017-02-14 NOTE — Progress Notes (Addendum)
   Subjective:    Patient ID: Christie [name], female    DOB: Sep 18, 1946, 71 y.o.   MRN: [id number]  HPI Patient arrives for follow up on right arm painShe does relate right shoulder pain and discomfort with movement and rotation she tried exercises it doesn't seem to be helping much  Blood pressureShe is noted her blood pressure to be up on several different occasions despite watching her diet taking her medicines.  Lumbar pain into the legsThis is made worse with walking and with exercise she denies any other type of troubles currently she states low back pain is been persistent over the past 6 months and progressive she has a history of 2 separate surgeries. She noticed that pain into the legs seems to be getting progressively worse.This patient states the biggest problem is a lumbar pain that radiates in both legs with walking. She is not able to go far without stopping. She has tried gradually walking she's also tried anti-inflammatories yet she is noted a progressive trouble with pain in fatigue into the legs with walking. Her symptoms correlate with possible spinal stenosis. Given her previous history of surgeries it is certainly possible scar tissue may be going on that is causing this  fatigueShe relates a lot of fatigue and tiredness when she tries to push herself she finds himself often having to stop exercising because of fatigue also because of lumbar pain that radiates into the legs   Review of Systems Patient relates right shoulder pain denies chest tightness pressure pain denies shortness breath denies sweats chills She does relate low back pain radiates into leg worse with walking.    Objective:   Physical Exam Lungs are clear hearts regular low back subjective tenderness negative straight leg raise strength in the arms are good pain in the right shoulder is normal noted blood pressure is good       Assessment & Plan:  Right shoulder rotator related issues referral to [facility]  Lab work is due for lipid and diabetes watch diet states overall under good control  Probable spinal stenosis issues lumbar x-ray lab work ordered may well need MRI has had previous surgery before, See discussion above  Diabetes apparently good control awake current findings  Hyperlipidemia all labs reviewed Labs ordered await results

## 2017-02-16 LAB — CBC WITH DIFFERENTIAL/PLATELET
BASOS ABS: 0 10*3/uL (ref 0.0–0.2)
Basos: 1 %
EOS (ABSOLUTE): 0.2 10*3/uL (ref 0.0–0.4)
Eos: 3 %
Hematocrit: 43.7 % (ref 34.0–46.6)
Hemoglobin: 14.4 g/dL (ref 11.1–15.9)
Immature Grans (Abs): 0 10*3/uL (ref 0.0–0.1)
Immature Granulocytes: 0 %
LYMPHS ABS: 1.5 10*3/uL (ref 0.7–3.1)
Lymphs: 24 %
MCH: 30.1 pg (ref 26.6–33.0)
MCHC: 33 g/dL (ref 31.5–35.7)
MCV: 91 fL (ref 79–97)
MONOS ABS: 0.3 10*3/uL (ref 0.1–0.9)
Monocytes: 5 %
NEUTROS ABS: 4.1 10*3/uL (ref 1.4–7.0)
Neutrophils: 67 %
Platelets: 190 10*3/uL (ref 150–379)
RBC: 4.79 x10E6/uL (ref 3.77–5.28)
RDW: 14.1 % (ref 12.3–15.4)
WBC: 6.1 10*3/uL (ref 3.4–10.8)

## 2017-02-16 LAB — BASIC METABOLIC PANEL
BUN/Creatinine Ratio: 33 — ABNORMAL HIGH (ref 12–28)
BUN: 19 mg/dL (ref 8–27)
CALCIUM: 9.4 mg/dL (ref 8.7–10.3)
CO2: 24 mmol/L (ref 18–29)
Chloride: 97 mmol/L (ref 96–106)
Creatinine, Ser: 0.58 mg/dL (ref 0.57–1.00)
GFR, EST AFRICAN AMERICAN: 106 mL/min/{1.73_m2} (ref >59)
GFR, EST NON AFRICAN AMERICAN: 92 mL/min/{1.73_m2} (ref >59)
Glucose: 145 mg/dL — ABNORMAL HIGH (ref 65–99)
POTASSIUM: 4.2 mmol/L (ref 3.5–5.2)
Sodium: 139 mmol/L (ref 134–144)

## 2017-02-16 LAB — HEPATIC FUNCTION PANEL
ALBUMIN: 4.4 g/dL (ref 3.5–4.8)
ALT: 29 IU/L (ref 0–32)
AST: 29 IU/L (ref 0–40)
Alkaline Phosphatase: 124 IU/L — ABNORMAL HIGH (ref 39–117)
BILIRUBIN TOTAL: 0.5 mg/dL (ref 0.0–1.2)
BILIRUBIN, DIRECT: 0.16 mg/dL (ref 0.00–0.40)
Total Protein: 6.4 g/dL (ref 6.0–8.5)

## 2017-02-16 LAB — LIPID PANEL
CHOLESTEROL TOTAL: 151 mg/dL (ref 100–199)
Chol/HDL Ratio: 2.3 ratio units (ref 0.0–4.4)
HDL: 67 mg/dL (ref >39)
LDL Calculated: 60 mg/dL (ref 0–99)
TRIGLYCERIDES: 118 mg/dL (ref 0–149)
VLDL Cholesterol Cal: 24 mg/dL (ref 5–40)

## 2017-02-16 LAB — SEDIMENTATION RATE: SED RATE: 2 mm/h (ref 0–40)

## 2017-02-16 LAB — RHEUMATOID FACTOR: Rhuematoid fact SerPl-aCnc: <10 IU/mL (ref 0.0–13.9)

## 2017-02-17 ENCOUNTER — Telehealth (HOSPITAL_COMMUNITY): Payer: Self-pay

## 2017-02-17 NOTE — Telephone Encounter (Signed)
Pt has referral,states she will bring it in next week. Shoulder order from Dr. Theda Sers will stand. NF3/2/18

## 2017-02-20 NOTE — Addendum Note (Signed)
Addended by: Ofilia Neas R on: 02/20/2017 10:54 AM   Modules accepted: Orders

## 2017-02-21 ENCOUNTER — Encounter: Payer: Self-pay | Admitting: Family Medicine

## 2017-03-01 ENCOUNTER — Ambulatory Visit (HOSPITAL_COMMUNITY): Payer: Medicare Other | Attending: Specialist

## 2017-03-01 DIAGNOSIS — R293 Abnormal posture: Secondary | ICD-10-CM

## 2017-03-01 DIAGNOSIS — M6281 Muscle weakness (generalized): Secondary | ICD-10-CM | POA: Diagnosis present

## 2017-03-01 DIAGNOSIS — M25511 Pain in right shoulder: Secondary | ICD-10-CM | POA: Diagnosis present

## 2017-03-01 DIAGNOSIS — G8929 Other chronic pain: Secondary | ICD-10-CM | POA: Insufficient documentation

## 2017-03-01 NOTE — Therapy (Signed)
[facility] [place], [place], [address] Phone: [phone]   Fax:  [phone]  Physical Therapy Evaluation  Patient Details  Name: Christie [name] MRN: [id number] Date of Birth: 1946/04/01 Referring Provider: Sydnee Cabal  Encounter Date: 03/01/2017      PT End of Session - 03/01/17 2201    Visit Number 1   Number of Visits 12   Date for PT Re-Evaluation 04/01/17   Authorization Type [employer] medicare    Authorization Time Period 03/01/17-04/15/17   Authorization - Visit Number 1   Authorization - Number of Visits 10   PT Start Time 1351   PT Stop Time 1427   PT Time Calculation (min) 36 min   Activity Tolerance Patient tolerated treatment well;Patient limited by pain   Behavior During Therapy [facility] for tasks assessed/performed      Past Medical History:  Diagnosis Date  . Cancer [facility]) 1999   external vulva lesion  . Chronic kidney disease 11/2008   stones  . DM (diabetes mellitus) ([place]) 2012  . Elevated liver enzymes   . Hyperlipidemia   . Hypertension 2000  . Spastic colon     Past Surgical History:  Procedure Laterality Date  . BACK SURGERY  2004  . CHOLECYSTECTOMY    . COLONOSCOPY  feb 2005  . COLONOSCOPY N/A 02/13/2014   Procedure: COLONOSCOPY;  Surgeon: Rogene Houston, MD;  Location: [facility]ITE;  Service: Endoscopy;  Laterality: N/A;  930-moved to [address] notified pt  . ESOPHAGOGASTRODUODENOSCOPY  09/2005  . EYE SURGERY Right    catatract removed  . EYE SURGERY Right    macular   surgery   . STAPEDECTOMY Bilateral    Right 1991 and Left 1994  . TOTAL ABDOMINAL HYSTERECTOMY  1982    There were no vitals filed for this visit.       Subjective Assessment - 03/01/17 1354    Subjective Pt reports falling as she was ascending some steps in December and has had pain in her shoulder since. as pain has persisted, she recently saw an orthopedist, who now refers her to PT.    Limitations --  Pt has  difficulty doign her hair, donning coats, putting on bra/coat; taking heavy items from the oven.    Currently in Pain? No/denies  Pain is mostly during activities and with sleeping postures.    Pain Score --  At worst during the day, pain be be 3-4/10.    Pain Orientation Right  lateral distal brachium   Pain Descriptors / Indicators Aching   Pain Type Chronic pain   Aggravating Factors  icing    Pain Relieving Factors putting a topical rub on arm; heat, tylenol helps.             [facility] PT Assessment - 03/01/17 0001      Assessment   Medical Diagnosis Right Shoulder Pain s/p Fall    Referring Provider Sydnee Cabal   Onset Date/Surgical Date --  December 2017    Hand Dominance Right   Next MD Visit March 21, 2017   Prior Therapy None     Precautions   Precautions None     Restrictions   Weight Bearing Restrictions No     Balance Screen   Has the patient fallen in the past 6 months Yes   How many times? 1   Has the patient had a decrease in activity level because of a fear of falling?  No   Is the patient reluctant to leave their home because of a fear of falling?  No     Prior Function   Level of Independence Independent     Observation/Other Assessments   Focus on Therapeutic Outcomes (FOTO)  FOTO: 63 (37% impaired)      Sensation   Light Touch Appears Intact     ROM / Strength   AROM / PROM / Strength Strength;PROM;AROM     AROM   AROM Assessment Site Shoulder   Right/Left Shoulder Left;Right   Right Shoulder Flexion 141 Degrees   Right Shoulder ABduction 131 Degrees   Right Shoulder Internal Rotation --  T10   Right Shoulder External Rotation --  T1   Left Shoulder Flexion 146 Degrees   Left Shoulder ABduction 134 Degrees   Left Shoulder Internal Rotation --  T7   Left Shoulder External Rotation --  T1     PROM   PROM Assessment Site Shoulder   Right/Left Shoulder Right;Left   Right Shoulder Internal Rotation 71 Degrees  supine, in abduction    Right Shoulder External Rotation 75 Degrees  supine, in abduction   Left Shoulder Internal Rotation 60 Degrees  supine, in abduction   Left Shoulder External Rotation 85 Degrees  supine, in abduction     Strength   Overall Strength Comments Upper Traps, Biceps, Triceps: 5/5 bilat grossly    Strength Assessment Site Shoulder   Right/Left Shoulder Right;Left   Right Shoulder Flexion 4/5  painful, pain worse in 'open can' position   Right Shoulder Extension --  Serratus Anterior: 4-/5   Right Shoulder ABduction 5/5  painful   Right Shoulder Internal Rotation 5/5   Right Shoulder External Rotation 4/5  infraspinatus   Left Shoulder Flexion 5/5   Left Shoulder Extension --  Serratus Anterior: 4-/5   Left Shoulder ABduction 5/5   Left Shoulder Internal Rotation 5/5   Left Shoulder External Rotation 5/5     Flexibility   Soft Tissue Assessment /Muscle Length yes   Hamstrings --  Pec minor shortening on Right   Quadriceps --  Anterior deltoid tightness on right                   [facility] Adult PT Treatment/Exercise - 03/01/17 0001      Manual Therapy   Manual Therapy Myofascial release;Passive ROM   Myofascial Release 5 minutes: Rt anterior deltoid   Passive ROM Rt GHJ: External Rotation stretching                PT Education - 03/01/17 2200    Education provided Yes   Education Details Expalined how postural limitations of rigth scapula and pec minor tightness are altering shoulder mechanics.    Person(s) Educated Patient   Methods Explanation;Demonstration;Tactile cues   Comprehension Verbalized understanding          PT Short Term Goals - 03/01/17 2214      PT SHORT TERM GOAL #1   Title After 3 weeks patient will demonstrate improved strength in Rt shoulder flexion, Rt serratus anterior, and Rt external rotation by half a MMT grade.    Status New     PT SHORT TERM GOAL #2   Title After 3 weeks patient will demonstrate improved Rt shoulder  A/ROM in internal rotation to T7 level.    Status New           PT Long Term Goals - 03/01/17 2216      PT  LONG TERM GOAL #1   Title After 6 weeks patient will demonstrate improved strength in Rt shoulder flexion, Rt serratus anterior, and Rt external rotation by one full MMT grade.    Status New     PT LONG TERM GOAL #2   Title After 6 weeks patient will report full performance of all self grouming, bathing, and dressing activity, pain free adn without limitation.    Status New               Plan - 03/01/17 03-29-08    Clinical Impression Statement Pt presenting 3 months s/p fall onto Rt arm with continued pain in shoulder. Examination revealing pain with end range movement in multiple planes, isolated muslce weakness in RUE, scapular dyskinesis with postural abnormality, decreased A/ROM, and isolated muslce spam in the shoulder. Pt will benefit from skilled PT interention to address these impairments, resolve limitation, and restore to PLOF in ADL, IADL, self care, and leisure activity.    Rehab Potential Good   PT Frequency 3x / week   PT Duration 6 weeks   PT Treatment/Interventions Dry needling;Patient/family education;ADLs/Self Care Home Management;Electrical Stimulation;Therapeutic activities;Therapeutic exercise;Moist Heat;Passive range of motion;Manual techniques   PT Next Visit Plan Review Treatment goals, Repeat manual to anterior deltoid, GHJ external rotation P/ROM stretching; HEP education for Pec minor stretch on Rt, infraspinatus strengthening, and scapular protraction in quadruped.    PT Home Exercise Plan None issued at eval.       Patient will benefit from skilled therapeutic intervention in order to improve the following deficits and impairments:  Hypomobility, Decreased activity tolerance, Postural dysfunction, Increased muscle spasms, Decreased strength, Increased fascial restricitons, Pain, Impaired UE functional use  Visit Diagnosis: Chronic right shoulder  pain - Plan: PT plan of care cert/re-cert  Posture abnormality - Plan: PT plan of care cert/re-cert  Muscle weakness (generalized) - Plan: PT plan of care cert/re-cert      G-Codes - 12/27/30 Mar 30, 2203    Functional Assessment Tool Used (Outpatient Only) FOTO: 63 (37% impairment)    Functional Limitation Mobility: Walking and moving around   Mobility: Walking and Moving Around Current Status [phone]) At least 20 percent but less than 40 percent impaired, limited or restricted   Mobility: Walking and Moving Around Goal Status [phone]) At least 1 percent but less than 20 percent impaired, limited or restricted       Problem List Patient Active Problem List   Diagnosis Date Noted  . Aortic stenosis, mild 11/14/2016  . Elevated alkaline phosphatase level 05/17/2015  . Urethral diverticulum 10/14/2014  . Lichen sclerosus et atrophicus 10/14/2014  . Poorly controlled type 2 diabetes mellitus ([place]) 09/29/2014  . Insomnia 09/29/2014  . Other malaise and fatigue 04/04/2014  . Depression 04/04/2014  . Essential hypertension, benign 08/13/2013  . Hyperlipidemia 08/13/2013  . History of kidney stones 08/13/2013  . Elevated liver enzymes 08/13/2013  . Vulvar cancer ([place]) 04/12/2013    10:21 PM, 03/01/17 Etta Grandchild, PT, DPT Physical Therapist at [facility] [phone] (office)      [place], [place], [address] Phone: [phone]   Fax:  [phone]  Name: Christie [name] MRN: [id number] Date of Birth: [birth year]

## 2017-03-07 ENCOUNTER — Ambulatory Visit (HOSPITAL_COMMUNITY): Payer: Medicare Other | Admitting: Physical Therapy

## 2017-03-07 DIAGNOSIS — G8929 Other chronic pain: Secondary | ICD-10-CM

## 2017-03-07 DIAGNOSIS — M6281 Muscle weakness (generalized): Secondary | ICD-10-CM

## 2017-03-07 DIAGNOSIS — R293 Abnormal posture: Secondary | ICD-10-CM

## 2017-03-07 DIAGNOSIS — M25511 Pain in right shoulder: Principal | ICD-10-CM

## 2017-03-07 NOTE — Therapy (Signed)
[place], [place], [address] Phone: [phone]   Fax:  [phone]  Physical Therapy Treatment  Patient Details  Name: Christie [name] MRN: [id number] Date of Birth: 07/28/1946 Referring Provider: Sydnee Cabal  Encounter Date: 03/07/2017      PT End of Session - 03/07/17 1801    Visit Number 2   Number of Visits 12   Date for PT Re-Evaluation 04/01/17   Authorization Type UHC medicare    Authorization Time Period 03/01/17-04/15/17   Authorization - Visit Number 2   Authorization - Number of Visits 10   PT Start Time 1609   PT Stop Time 1652   PT Time Calculation (min) 43 min   Activity Tolerance Patient tolerated treatment well;Patient limited by pain   Behavior During Therapy [facility] for tasks assessed/performed      Past Medical History:  Diagnosis Date  . Cancer [facility]) 1999   external vulva lesion  . Chronic kidney disease 11/2008   stones  . DM (diabetes mellitus) ([place]) 2012  . Elevated liver enzymes   . Hyperlipidemia   . Hypertension 2000  . Spastic colon     Past Surgical History:  Procedure Laterality Date  . BACK SURGERY  2004  . CHOLECYSTECTOMY    . COLONOSCOPY  feb 2005  . COLONOSCOPY N/A 02/13/2014   Procedure: COLONOSCOPY;  Surgeon: Rogene Houston, MD;  [facility];  Service: Endoscopy;  Laterality: N/A;  930-moved to 100 Ann notified pt  . ESOPHAGOGASTRODUODENOSCOPY  09/2005  . EYE SURGERY Right    catatract removed  . EYE SURGERY Right    macular   surgery   . STAPEDECTOMY Bilateral    Right 1991 and Left 1994  . TOTAL ABDOMINAL HYSTERECTOMY  1982    There were no vitals filed for this visit.      Subjective Assessment - 03/07/17 1650    Subjective Pt states she is doing OK today. Reports her shoulder is about the same as last visit.  States she returns tomorrow.   Currently in Pain? No/denies                         [facility] Adult PT  Treatment/Exercise - 03/07/17 0001      Shoulder Exercises: Supine   Flexion 10 reps   Flexion Limitations with wand for ROM   ABduction 10 reps   ABduction Limitations with wand for ROM     Shoulder Exercises: Prone   Other Prone Exercises quadruped scap protraction given for HEP     Shoulder Exercises: Sidelying   External Rotation Right;10 reps   ABduction Right;10 reps     Shoulder Exercises: Stretch   Other Shoulder Stretches doorway stretch for IR 3X20"      Manual Therapy   Manual Therapy Myofascial release;Passive ROM   Myofascial Release 5 minutes: Rt anterior deltoid   Passive ROM Rt GHJ: External Rotation stretching                  PT Short Term Goals - 03/07/17 1810      PT SHORT TERM GOAL #1   Title After 3 weeks patient will demonstrate improved strength in Rt shoulder flexion, Rt serratus anterior, and Rt external rotation by half a MMT grade.    Status On-going     PT SHORT TERM GOAL #2   Title After 3 weeks patient will demonstrate improved Rt shoulder  A/ROM in internal rotation to T7 level.    Status On-going           PT Long Term Goals - 03/07/17 1810      PT LONG TERM GOAL #1   Title After 6 weeks patient will demonstrate improved strength in Rt shoulder flexion, Rt serratus anterior, and Rt external rotation by one full MMT grade.    Status On-going     PT LONG TERM GOAL #2   Title After 6 weeks patient will report full performance of all self grouming, bathing, and dressing activity, pain free adn without limitation.    Status On-going               Plan - 03/07/17 1805    Clinical Impression Statement Reviewed initial evaluation and initiated HEP per therapist plan.  Pt able to complete all exercises with only minimal discomfort, no pain.  Abduction continues to be most limited at this point.     Rehab Potential Good   PT Frequency 3x / week   PT Duration 6 weeks   PT Treatment/Interventions Dry needling;Patient/family  education;ADLs/Self Care Home Management;Electrical Stimulation;Therapeutic activities;Therapeutic exercise;Moist Heat;Passive range of motion;Manual techniques   PT Next Visit Plan continue to progress ROM to WNL then begin strengthening.  Continue with manual to decrease adhesions to further improve mobiltiy.    PT Home Exercise Plan 3/20: supine wand flexion and abduction, pec doorway stretch, scapular protraction in quadruped      Patient will benefit from skilled therapeutic intervention in order to improve the following deficits and impairments:  Hypomobility, Decreased activity tolerance, Postural dysfunction, Increased muscle spasms, Decreased strength, Increased fascial restricitons, Pain, Impaired UE functional use  Visit Diagnosis: Chronic right shoulder pain  Posture abnormality  Muscle weakness (generalized)     Problem List Patient Active Problem List   Diagnosis Date Noted  . Aortic stenosis, mild 11/14/2016  . Elevated alkaline phosphatase level 05/17/2015  . Urethral diverticulum 10/14/2014  . Lichen sclerosus et atrophicus 10/14/2014  . Poorly controlled type 2 diabetes mellitus ([place]) 09/29/2014  . Insomnia 09/29/2014  . Other malaise and fatigue 04/04/2014  . Depression 04/04/2014  . Essential hypertension, benign 08/13/2013  . Hyperlipidemia 08/13/2013  . History of kidney stones 08/13/2013  . Elevated liver enzymes 08/13/2013  . Vulvar cancer [facility]) 04/12/2013    Teena Irani, PTA/CLT [phone]  03/07/2017, 6:10 PM  [place] [address] [place], [place], [address] Phone: [phone]   Fax:  [phone]  Name: Christie [name] MRN: [id number] Date of Birth: 04-28-1946

## 2017-03-08 ENCOUNTER — Ambulatory Visit (HOSPITAL_COMMUNITY): Payer: Medicare Other | Admitting: Physical Therapy

## 2017-03-08 DIAGNOSIS — M6281 Muscle weakness (generalized): Secondary | ICD-10-CM

## 2017-03-08 DIAGNOSIS — R293 Abnormal posture: Secondary | ICD-10-CM

## 2017-03-08 DIAGNOSIS — G8929 Other chronic pain: Secondary | ICD-10-CM

## 2017-03-08 DIAGNOSIS — M25511 Pain in right shoulder: Secondary | ICD-10-CM | POA: Diagnosis not present

## 2017-03-08 NOTE — Therapy (Signed)
[place], [place], [address] Phone: [phone]   Fax:  [phone]  Physical Therapy Treatment  Patient Details  Name: Christie [name] MRN: [id number] Date of Birth: [birth year] Referring Provider: Sydnee Cabal  Encounter Date: 03/08/2017      PT End of Session - 03/08/17 1648    Visit Number 3   Number of Visits 12   Date for PT Re-Evaluation 04/01/17   Authorization Type UHC medicare    Authorization Time Period 03/01/17-04/15/17   Authorization - Visit Number 3   Authorization - Number of Visits 10   PT Start Time 1601   PT Stop Time 1640   PT Time Calculation (min) 39 min   Activity Tolerance Patient tolerated treatment well   Behavior During Therapy [facility] for tasks assessed/performed      Past Medical History:  Diagnosis Date  . Cancer [facility]) 1999   external vulva lesion  . Chronic kidney disease 11/2008   stones  . DM (diabetes mellitus) ([place]) 2012  . Elevated liver enzymes   . Hyperlipidemia   . Hypertension 2000  . Spastic colon     Past Surgical History:  Procedure Laterality Date  . BACK SURGERY  2004  . CHOLECYSTECTOMY    . COLONOSCOPY  feb 2005  . COLONOSCOPY N/A 02/13/2014   Procedure: COLONOSCOPY;  Surgeon: Rogene Houston, MD;  [facility];  Service: Endoscopy;  Laterality: N/A;  930-moved to 100 [address] notified pt  . ESOPHAGOGASTRODUODENOSCOPY  09/2005  . EYE SURGERY Right    catatract removed  . EYE SURGERY Right    macular   surgery   . STAPEDECTOMY Bilateral    Right 1991 and Left 1994  . TOTAL ABDOMINAL HYSTERECTOMY  1982    There were no vitals filed for this visit.      Subjective Assessment - 03/08/17 1602    Subjective Patient arrives stating she is doing well, she feels the manual we have done is helping. Nothing else major going on.    Currently in Pain? No/denies                         [facility] Adult PT Treatment/Exercise - 03/08/17 0001       Shoulder Exercises: Prone   Other Prone Exercises blackburn 6 1x10 each   0#    Other Prone Exercises quadruped scap protraction on dynadisc, modifed quadruped at mat table 1x15     Shoulder Exercises: Standing   Other Standing Exercises wall pushups 1x15     Shoulder Exercises: Stretch   Star Gazer Stretch Other (comment)  x4 minutes supine    Other Shoulder Stretches corner stretch 4x30 seconds    Other Shoulder Stretches standing ER stretch with sheet R 2x30 seconds      Manual Therapy   Manual Therapy Soft tissue mobilization   Myofascial Release R anterior deltoid    Passive ROM R GH depression grade III x5 rounds; able to achieve full PROM flexion/ABD/ER/IR after mobs                 PT Education - 03/08/17 1648    Education provided No          PT Short Term Goals - 03/07/17 1810      PT SHORT TERM GOAL #1   Title After 3 weeks patient will demonstrate improved strength in Rt shoulder flexion, Rt serratus anterior, and Rt external  rotation by half a MMT grade.    Status On-going     PT SHORT TERM GOAL #2   Title After 3 weeks patient will demonstrate improved Rt shoulder A/ROM in internal rotation to T7 level.    Status On-going           PT Long Term Goals - 03/07/17 1810      PT LONG TERM GOAL #1   Title After 6 weeks patient will demonstrate improved strength in Rt shoulder flexion, Rt serratus anterior, and Rt external rotation by one full MMT grade.    Status On-going     PT LONG TERM GOAL #2   Title After 6 weeks patient will report full performance of all self grouming, bathing, and dressing activity, pain free adn without limitation.    Status On-going               Plan - 03/08/17 1649    Clinical Impression Statement Patient arrives today in good spirits and reporting she is doing well. Began session with STM to anterior R deltoid and lateral pec minor attachment points, also introduced some glenohumeral mobilizations in  inferior direction and patient able to achieve full ROM with flexion and abduction R shoulder passively and actively after mobilizations. She did continue to demonstrate some stiffness in R shoulder ER however, and introduced standing sheet ER stretch as well as star gazer ER stretch today to address. Due to ROM appearing to improve, also progressed scapular protraction in CKC on dynadiscs with mod tactile cues for correct form; also introduced wall pushups and Blackburn 6 to further strengthen the rotator cuff as a whole.    Rehab Potential Good   PT Frequency 3x / week   PT Duration 6 weeks   PT Treatment/Interventions Dry needling;Patient/family education;ADLs/Self Care Home Management;Electrical Stimulation;Therapeutic activities;Therapeutic exercise;Moist Heat;Passive range of motion;Manual techniques   PT Next Visit Plan continue with [facility] joint mobilization with focus on posterior to anterior mob in prone to assist increasing shoulder ER, continue anterior delt STM/myofascial release; continue with R shoulder ER stretches; continue scapular and rotator cuff strengthening    PT Home Exercise Plan 3/20: supine wand flexion and abduction, pec doorway stretch, scapular protraction in quadruped   Consulted and Agree with Plan of Care Patient      Patient will benefit from skilled therapeutic intervention in order to improve the following deficits and impairments:  Hypomobility, Decreased activity tolerance, Postural dysfunction, Increased muscle spasms, Decreased strength, Increased fascial restricitons, Pain, Impaired UE functional use  Visit Diagnosis: Chronic right shoulder pain  Posture abnormality  Muscle weakness (generalized)     Problem List Patient Active Problem List   Diagnosis Date Noted  . Aortic stenosis, mild 11/14/2016  . Elevated alkaline phosphatase level 05/17/2015  . Urethral diverticulum 10/14/2014  . Lichen sclerosus et atrophicus 10/14/2014  . Poorly controlled  type 2 diabetes mellitus ([place]) 09/29/2014  . Insomnia 09/29/2014  . Other malaise and fatigue 04/04/2014  . Depression 04/04/2014  . Essential hypertension, benign 08/13/2013  . Hyperlipidemia 08/13/2013  . History of kidney stones 08/13/2013  . Elevated liver enzymes 08/13/2013  . Vulvar cancer ([place]) 04/12/2013    Deniece Ree PT, DPT [phone]  [place] [address] [place], [place], [address] Phone: [phone]   Fax:  [phone]  Name: DILAN FULLENWIDER MRN: [id number] Date of Birth: [birth year]

## 2017-03-09 ENCOUNTER — Ambulatory Visit (HOSPITAL_COMMUNITY): Payer: Medicare Other

## 2017-03-09 DIAGNOSIS — R293 Abnormal posture: Secondary | ICD-10-CM

## 2017-03-09 DIAGNOSIS — M6281 Muscle weakness (generalized): Secondary | ICD-10-CM

## 2017-03-09 DIAGNOSIS — G8929 Other chronic pain: Secondary | ICD-10-CM

## 2017-03-09 DIAGNOSIS — M25511 Pain in right shoulder: Secondary | ICD-10-CM | POA: Diagnosis not present

## 2017-03-09 NOTE — Therapy (Signed)
[place], [place], [address] Phone: [phone]   Fax:  [phone]  Physical Therapy Treatment  Patient Details  Name: Christie [name] MRN: [id number] Date of Birth: Sep 08, 1946 Referring Provider: Sydnee Cabal  Encounter Date: 03/09/2017      PT End of Session - 03/09/17 1605    Visit Number 4   Number of Visits 12   Date for PT Re-Evaluation 04/01/17   Authorization Type UHC medicare    Authorization Time Period 03/01/17-04/15/17   Authorization - Visit Number 4   Authorization - Number of Visits 10   PT Start Time 1522   PT Stop Time 1601   PT Time Calculation (min) 39 min   Activity Tolerance Patient tolerated treatment well;No increased pain   Behavior During Therapy [facility] for tasks assessed/performed      Past Medical History:  Diagnosis Date  . Cancer [facility]) 1999   external vulva lesion  . Chronic kidney disease 11/2008   stones  . DM (diabetes mellitus) ([place]) 2012  . Elevated liver enzymes   . Hyperlipidemia   . Hypertension 2000  . Spastic colon     Past Surgical History:  Procedure Laterality Date  . BACK SURGERY  2004  . CHOLECYSTECTOMY    . COLONOSCOPY  feb 2005  . COLONOSCOPY N/A 02/13/2014   Procedure: COLONOSCOPY;  Surgeon: Rogene Houston, MD;  [facility];  Service: Endoscopy;  Laterality: N/A;  930-moved to 100 Ann notified pt  . ESOPHAGOGASTRODUODENOSCOPY  09/2005  . EYE SURGERY Right    catatract removed  . EYE SURGERY Right    macular   surgery   . STAPEDECTOMY Bilateral    Right 1991 and Left 1994  . TOTAL ABDOMINAL HYSTERECTOMY  1982    There were no vitals filed for this visit.      Subjective Assessment - 03/09/17 1528    Subjective Pt reports she is doing well today, her shoulder a bit painful since yesterday's visit. No real pain today, things are feeling better overall.             [facility] PT Assessment - 03/09/17 0001      AROM   Right Shoulder  External Rotation --  95 degrees, supine in 90* abbcution      PROM   Right Shoulder External Rotation 110 Degrees  at 90 degrees abduction                      [facility] Adult PT Treatment/Exercise - 03/09/17 0001      Shoulder Exercises: Standing   External Rotation Strengthening;Right;10 reps  2x10, 2lb weight @ 90 abduction.    Flexion Right;AROM;10 reps  90-170 degrees, VC for scap depression, T-spine extension   Row 15 reps;Theraband  2x15 blue TB, VC for scap retrac     Shoulder Exercises: Stretch   Other Shoulder Stretches Supine Pec major stretch "T" on half roll x 5 minutes.   Overhead flexion Rt 2x20 followign pec minor stretch   Other Shoulder Stretches Prone Serratus puchc 10lb straight weigth, 2x10 bilat.   Supine Rt shoulder fleixon 0-45 degrees 2x10 c 2lb     Manual Therapy   Manual Therapy Joint mobilization   Joint Mobilization Grade-III GHJ mobilization 2x30sec:   posterior glide in ER/ABD, post glide+distraction fleixon   Passive ROM Rt scapular retraction/depression: 2x30sec  Rt ER stretch, @90  degrees abduction.  PT Education - 03/08/17 1648    Education provided No          PT Short Term Goals - 03/07/17 1810      PT SHORT TERM GOAL #1   Title After 3 weeks patient will demonstrate improved strength in Rt shoulder flexion, Rt serratus anterior, and Rt external rotation by half a MMT grade.    Status On-going     PT SHORT TERM GOAL #2   Title After 3 weeks patient will demonstrate improved Rt shoulder A/ROM in internal rotation to T7 level.    Status On-going           PT Long Term Goals - 03/07/17 1810      PT LONG TERM GOAL #1   Title After 6 weeks patient will demonstrate improved strength in Rt shoulder flexion, Rt serratus anterior, and Rt external rotation by one full MMT grade.    Status On-going     PT LONG TERM GOAL #2   Title After 6 weeks patient will report full performance of all self  grouming, bathing, and dressing activity, pain free adn without limitation.    Status On-going               Plan - 03/09/17 1605    Clinical Impression Statement Session contiued to persue improved ROM in sT-spine, Rt scapula and GHJ into flexion and external rotation. ROM has improved much since eval 1WA. Functional strengthening and motor control training continue to progress but movement patterns return to dysfuncitonal status with onset of scapular fatigue. This point out to patient for improved biofeedack during funcitonal tasks at home. Good progress toward goals overall.    Rehab Potential Good   PT Frequency 3x / week   PT Duration 6 weeks   PT Treatment/Interventions Dry needling;Patient/family education;ADLs/Self Care Home Management;Electrical Stimulation;Therapeutic activities;Therapeutic exercise;Moist Heat;Passive range of motion;Manual techniques   PT Next Visit Plan Continue to progress rows, isolated external rotation, scapular strength in depression, restaction, and lateral rotation. Keep focus on motor control training.    PT Home Exercise Plan 3/20: supine wand flexion and abduction, pec doorway stretch, scapular protraction in quadruped   Consulted and Agree with Plan of Care Patient      Patient will benefit from skilled therapeutic intervention in order to improve the following deficits and impairments:  Hypomobility, Decreased activity tolerance, Postural dysfunction, Increased muscle spasms, Decreased strength, Increased fascial restricitons, Pain, Impaired UE functional use  Visit Diagnosis: Chronic right shoulder pain  Posture abnormality  Muscle weakness (generalized)     Problem List Patient Active Problem List   Diagnosis Date Noted  . Aortic stenosis, mild 11/14/2016  . Elevated alkaline phosphatase level 05/17/2015  . Urethral diverticulum 10/14/2014  . Lichen sclerosus et atrophicus 10/14/2014  . Poorly controlled type 2 diabetes mellitus  ([place]) 09/29/2014  . Insomnia 09/29/2014  . Other malaise and fatigue 04/04/2014  . Depression 04/04/2014  . Essential hypertension, benign 08/13/2013  . Hyperlipidemia 08/13/2013  . History of kidney stones 08/13/2013  . Elevated liver enzymes 08/13/2013  . Vulvar cancer ([place]) 04/12/2013    4:11 PM, 03/09/17 Etta Grandchild, PT, DPT Physical Therapist at [facility] [phone] (office)     [place], [place], [address] Phone: [phone]   Fax:  [phone]  Name: Christie [name] MRN: [id number] Date of Birth: [birth year]

## 2017-03-13 ENCOUNTER — Ambulatory Visit (HOSPITAL_COMMUNITY): Payer: Medicare Other | Admitting: Physical Therapy

## 2017-03-13 ENCOUNTER — Telehealth (HOSPITAL_COMMUNITY): Payer: Self-pay | Admitting: Physical Therapy

## 2017-03-13 DIAGNOSIS — M25511 Pain in right shoulder: Secondary | ICD-10-CM | POA: Diagnosis not present

## 2017-03-13 DIAGNOSIS — G8929 Other chronic pain: Secondary | ICD-10-CM

## 2017-03-13 DIAGNOSIS — R293 Abnormal posture: Secondary | ICD-10-CM

## 2017-03-13 DIAGNOSIS — M6281 Muscle weakness (generalized): Secondary | ICD-10-CM

## 2017-03-13 NOTE — Telephone Encounter (Signed)
Pt has MD apptments on these two dates 4/2-03/2017

## 2017-03-13 NOTE — Therapy (Signed)
[place], [place], [address] Phone: [phone]   Fax:  [phone]  Physical Therapy Treatment  Patient Details  Name: Christie [name] MRN: [id number] Date of Birth: 11-Jan-1946 Referring Provider: Sydnee Cabal  Encounter Date: 03/13/2017      PT End of Session - 03/13/17 1707    Visit Number 5   Number of Visits 12   Date for PT Re-Evaluation 04/01/17   Authorization Type UHC medicare    Authorization Time Period 03/01/17-04/15/17   Authorization - Visit Number 5   Authorization - Number of Visits 10   PT Start Time 1600   PT Stop Time 5449   PT Time Calculation (min) 45 min   Activity Tolerance Patient tolerated treatment well;No increased pain   Behavior During Therapy [facility] for tasks assessed/performed      Past Medical History:  Diagnosis Date  . Cancer [facility]) 1999   external vulva lesion  . Chronic kidney disease 11/2008   stones  . DM (diabetes mellitus) ([place]) 2012  . Elevated liver enzymes   . Hyperlipidemia   . Hypertension 2000  . Spastic colon     Past Surgical History:  Procedure Laterality Date  . BACK SURGERY  2004  . CHOLECYSTECTOMY    . COLONOSCOPY  feb 2005  . COLONOSCOPY N/A 02/13/2014   Procedure: COLONOSCOPY;  Surgeon: Rogene Houston, MD;  [facility];  Service: Endoscopy;  Laterality: N/A;  930-moved to 100 Ann notified pt  . ESOPHAGOGASTRODUODENOSCOPY  09/2005  . EYE SURGERY Right    catatract removed  . EYE SURGERY Right    macular   surgery   . STAPEDECTOMY Bilateral    Right 1991 and Left 1994  . TOTAL ABDOMINAL HYSTERECTOMY  1982    There were no vitals filed for this visit.      Subjective Assessment - 03/13/17 1729    Subjective Pt states her arm is feeling better and feels her motion is back to normal.  Pt questioning dry needling and states she wore her tank top as instructed. Daughter also accompanied today to see treatment.  Currently wtihout pain.                          [facility] Adult PT Treatment/Exercise - 03/13/17 0001      Shoulder Exercises: Sidelying   External Rotation Right;10 reps   External Rotation Weight (lbs) 2   ABduction Right;10 reps   ABduction Weight (lbs) 2     Shoulder Exercises: Standing   Extension 15 reps;Theraband   Theraband Level (Shoulder Extension) Level 2 (Red)   Row 15 reps;Theraband   Theraband Level (Shoulder Row) Level 2 (Red)   Retraction Both;15 reps;Weights   Theraband Level (Shoulder Retraction) Level 2 (Red)   Other Standing Exercises wall pushups 1x15     Shoulder Exercises: Stretch   Other Shoulder Stretches Supine Pec major stretch "T" on half roll x 5 minutes.                   PT Short Term Goals - 03/07/17 1810      PT SHORT TERM GOAL #1   Title After 3 weeks patient will demonstrate improved strength in Rt shoulder flexion, Rt serratus anterior, and Rt external rotation by half a MMT grade.    Status On-going     PT SHORT TERM GOAL #2   Title After 3 weeks patient  will demonstrate improved Rt shoulder A/ROM in internal rotation to T7 level.    Status On-going           PT Long Term Goals - 03/07/17 1810      PT LONG TERM GOAL #1   Title After 6 weeks patient will demonstrate improved strength in Rt shoulder flexion, Rt serratus anterior, and Rt external rotation by one full MMT grade.    Status On-going     PT LONG TERM GOAL #2   Title After 6 weeks patient will report full performance of all self grouming, bathing, and dressing activity, pain free adn without limitation.    Status On-going               Plan - 03/13/17 1707    Clinical Impression Statement Pt now with full functional ROM and states her pain/discomfort "comes and goes" pointing to Rt medial bicep region.  Pt does voice interest in dry needling and came prepared for that treatment this session.  Worked with scheduling to get her on trained therapists schedule next  session to discuss this further.  Progressed strengthing this session and ended with foam roller stretch.  Pt reported no increased pain and overall improvement at end of session.    Rehab Potential Good   PT Frequency 3x / week   PT Duration 6 weeks   PT Treatment/Interventions Dry needling;Patient/family education;ADLs/Self Care Home Management;Electrical Stimulation;Therapeutic activities;Therapeutic exercise;Moist Heat;Passive range of motion;Manual techniques   PT Next Visit Plan Continue to focus on improved motor control and overall strength in UE, scap and postural musculature.  To determine benefit of  dry needling next session.    PT Home Exercise Plan 3/20: supine wand flexion and abduction, pec doorway stretch, scapular protraction in quadruped   Consulted and Agree with Plan of Care Patient      Patient will benefit from skilled therapeutic intervention in order to improve the following deficits and impairments:  Hypomobility, Decreased activity tolerance, Postural dysfunction, Increased muscle spasms, Decreased strength, Increased fascial restricitons, Pain, Impaired UE functional use  Visit Diagnosis: Chronic right shoulder pain  Posture abnormality  Muscle weakness (generalized)     Problem List Patient Active Problem List   Diagnosis Date Noted  . Aortic stenosis, mild 11/14/2016  . Elevated alkaline phosphatase level 05/17/2015  . Urethral diverticulum 10/14/2014  . Lichen sclerosus et atrophicus 10/14/2014  . Poorly controlled type 2 diabetes mellitus ([place]) 09/29/2014  . Insomnia 09/29/2014  . Other malaise and fatigue 04/04/2014  . Depression 04/04/2014  . Essential hypertension, benign 08/13/2013  . Hyperlipidemia 08/13/2013  . History of kidney stones 08/13/2013  . Elevated liver enzymes 08/13/2013  . Vulvar cancer [facility]) 04/12/2013    Teena Irani, PTA/CLT [phone]  03/13/2017, 5:30 PM  [place], [place], [address] Phone: [phone]   Fax:  [phone]  Name: Christie [name] MRN: [id number] Date of Birth: 05-24-1946

## 2017-03-15 ENCOUNTER — Ambulatory Visit (HOSPITAL_COMMUNITY): Payer: Medicare Other

## 2017-03-15 DIAGNOSIS — M6281 Muscle weakness (generalized): Secondary | ICD-10-CM

## 2017-03-15 DIAGNOSIS — R293 Abnormal posture: Secondary | ICD-10-CM

## 2017-03-15 DIAGNOSIS — M25511 Pain in right shoulder: Principal | ICD-10-CM

## 2017-03-15 DIAGNOSIS — G8929 Other chronic pain: Secondary | ICD-10-CM

## 2017-03-15 NOTE — Therapy (Signed)
[place], [place], [address] Phone: [phone]   Fax:  [phone]  Physical Therapy Treatment  Patient Details  Name: Christie [name] MRN: [id number] Date of Birth: 11-Dec-1946 Referring Provider: Sydnee Cabal  Encounter Date: 03/15/2017      PT End of Session - 03/15/17 1708    Visit Number 6   Number of Visits 12   Date for PT Re-Evaluation 04/01/17   Authorization Type UHC medicare    Authorization Time Period 03/01/17-04/15/17   Authorization - Visit Number 6   Authorization - Number of Visits 10   PT Start Time 1540   PT Stop Time 1726   PT Time Calculation (min) 38 min      Past Medical History:  Diagnosis Date  . Cancer [facility]) 1999   external vulva lesion  . Chronic kidney disease 11/2008   stones  . DM (diabetes mellitus) ([place]) 2012  . Elevated liver enzymes   . Hyperlipidemia   . Hypertension 2000  . Spastic colon     Past Surgical History:  Procedure Laterality Date  . BACK SURGERY  2004  . CHOLECYSTECTOMY    . COLONOSCOPY  feb 2005  . COLONOSCOPY N/A 02/13/2014   Procedure: COLONOSCOPY;  Surgeon: Rogene Houston, MD;  [facility];  Service: Endoscopy;  Laterality: N/A;  930-moved to [address] notified pt  . ESOPHAGOGASTRODUODENOSCOPY  09/2005  . EYE SURGERY Right    catatract removed  . EYE SURGERY Right    macular   surgery   . STAPEDECTOMY Bilateral    Right 1991 and Left 1994  . TOTAL ABDOMINAL HYSTERECTOMY  1982    There were no vitals filed for this visit.      Subjective Assessment - 03/15/17 1649    Subjective Pt stated her shoulder is feeling good today, no reports of pain in 3-4 days.  reports compliance with HEP.  Wishes to work on shoulder strengthening today.     Currently in Pain? No/denies                         [facility] Adult PT Treatment/Exercise - 03/15/17 0001      Shoulder Exercises: Prone   Other Prone Exercises rows, extension,  wback 15reps     Shoulder Exercises: Sidelying   External Rotation Right;15 reps   External Rotation Weight (lbs) 2   ABduction Right;15 reps   ABduction Weight (lbs) 2     Shoulder Exercises: Standing   Extension 15 reps;Theraband   Theraband Level (Shoulder Extension) Level 3 (Green)   Row 15 reps;Theraband   Theraband Level (Shoulder Row) Level 3 (Green)   Retraction Both;15 reps;Weights   Theraband Level (Shoulder Retraction) Level 3 (Green)   Other Standing Exercises wall pushups 1x15   Other Standing Exercises walk up wall then extend 10x 5"     Shoulder Exercises: Stretch   Corner Stretch 3 reps;30 seconds   Other Shoulder Stretches Supine Pec major stretch "T" on half roll x 5 minutes.                   PT Short Term Goals - 03/07/17 1810      PT SHORT TERM GOAL #1   Title After 3 weeks patient will demonstrate improved strength in Rt shoulder flexion, Rt serratus anterior, and Rt external rotation by half a MMT grade.    Status On-going  PT SHORT TERM GOAL #2   Title After 3 weeks patient will demonstrate improved Rt shoulder A/ROM in internal rotation to T7 level.    Status On-going           PT Long Term Goals - 03/07/17 1810      PT LONG TERM GOAL #1   Title After 6 weeks patient will demonstrate improved strength in Rt shoulder flexion, Rt serratus anterior, and Rt external rotation by one full MMT grade.    Status On-going     PT LONG TERM GOAL #2   Title After 6 weeks patient will report full performance of all self grouming, bathing, and dressing activity, pain free adn without limitation.    Status On-going               Plan - 03/15/17 1709    Clinical Impression Statement Pt progressing well with full functional ROM and no reports of pain.  Session foucs on strengthening for Rt shoulder and posture muscualture.  Increased resistance to green on theraband and increased reps with minimal difficulty due to weakness.  Added  postural strengthening infront of wall with great mechanics noted.  Pt able to complete all therex with minimal cueing required.  EOS pt reports she is feeling great, reviewed goals and feels all her personal goals have been met.  Pt does report interest in trying the dry needling though does not c/o pain or discomfort with any movement.     Rehab Potential Good   PT Frequency 3x / week   PT Duration 6 weeks   PT Treatment/Interventions Dry needling;Patient/family education;ADLs/Self Care Home Management;Electrical Stimulation;Therapeutic activities;Therapeutic exercise;Moist Heat;Passive range of motion;Manual techniques   PT Next Visit Plan Reassess next session.  Give contact info for dry needling therapist as pt is interested.   PT Home Exercise Plan 3/20: supine wand flexion and abduction, pec doorway stretch, scapular protraction in quadruped      Patient will benefit from skilled therapeutic intervention in order to improve the following deficits and impairments:  Hypomobility, Decreased activity tolerance, Postural dysfunction, Increased muscle spasms, Decreased strength, Increased fascial restricitons, Pain, Impaired UE functional use  Visit Diagnosis: Chronic right shoulder pain  Posture abnormality  Muscle weakness (generalized)     Problem List Patient Active Problem List   Diagnosis Date Noted  . Aortic stenosis, mild 11/14/2016  . Elevated alkaline phosphatase level 05/17/2015  . Urethral diverticulum 10/14/2014  . Lichen sclerosus et atrophicus 10/14/2014  . Poorly controlled type 2 diabetes mellitus ([place]) 09/29/2014  . Insomnia 09/29/2014  . Other malaise and fatigue 04/04/2014  . Depression 04/04/2014  . Essential hypertension, benign 08/13/2013  . Hyperlipidemia 08/13/2013  . History of kidney stones 08/13/2013  . Elevated liver enzymes 08/13/2013  . Vulvar cancer [facility]) 04/12/2013   Ihor Austin, [place]; [place]  Aldona Lento 03/15/2017,  5:29 PM  [place], [place], [address] Phone: [phone]   Fax:  [phone]  Name: LADAVIA LINDENBAUM MRN: [id number] Date of Birth: Nov 21, 1946

## 2017-03-17 ENCOUNTER — Ambulatory Visit (HOSPITAL_COMMUNITY): Payer: Medicare Other

## 2017-03-17 ENCOUNTER — Encounter (HOSPITAL_COMMUNITY): Payer: Self-pay

## 2017-03-17 DIAGNOSIS — M25511 Pain in right shoulder: Principal | ICD-10-CM

## 2017-03-17 DIAGNOSIS — G8929 Other chronic pain: Secondary | ICD-10-CM

## 2017-03-17 DIAGNOSIS — M6281 Muscle weakness (generalized): Secondary | ICD-10-CM

## 2017-03-17 DIAGNOSIS — R293 Abnormal posture: Secondary | ICD-10-CM

## 2017-03-17 NOTE — Therapy (Signed)
[place], [place], [address] Phone: [phone]   Fax:  [phone]  Physical Therapy Treatment/Discharge Summary  Patient Details  Name: Christie [name] MRN: [id number] Date of Birth: 1946-03-03 Referring Provider: Sydnee Cabal  Encounter Date: 03/17/2017      PT End of Session - 03/17/17 0904    Visit Number 7   Number of Visits 12   Date for PT Re-Evaluation 04/01/17   Authorization Type UHC medicare    Authorization Time Period 03/01/17-04/15/17   Authorization - Visit Number 7   Authorization - Number of Visits 10   PT Start Time 0903   PT Stop Time 0936   PT Time Calculation (min) 33 min   Activity Tolerance Patient tolerated treatment well;No increased pain   Behavior During Therapy WFL for tasks assessed/performed      Past Medical History:  Diagnosis Date  . Cancer [facility]) 1999   external vulva lesion  . Chronic kidney disease 11/2008   stones  . DM (diabetes mellitus) ([place]) 2012  . Elevated liver enzymes   . Hyperlipidemia   . Hypertension 2000  . Spastic colon     Past Surgical History:  Procedure Laterality Date  . BACK SURGERY  2004  . CHOLECYSTECTOMY    . COLONOSCOPY  feb 2005  . COLONOSCOPY N/A 02/13/2014   Procedure: COLONOSCOPY;  Surgeon: Rogene Houston, MD;  [facility];  Service: Endoscopy;  Laterality: N/A;  930-moved to [address] notified pt  . ESOPHAGOGASTRODUODENOSCOPY  09/2005  . EYE SURGERY Right    catatract removed  . EYE SURGERY Right    macular   surgery   . STAPEDECTOMY Bilateral    Right 1991 and Left 1994  . TOTAL ABDOMINAL HYSTERECTOMY  1982    There were no vitals filed for this visit.      Subjective Assessment - 03/17/17 0906    Subjective Pt states that she feels good this morning. She had a tinge in the night last night when she had gotten up to go to the bathroom.   Currently in Pain? No/denies            [facility] PT Assessment - 03/17/17  0001      AROM   Right Shoulder Flexion 166 Degrees   Right Shoulder ABduction 169 Degrees   Right Shoulder Internal Rotation --  T8   Right Shoulder External Rotation --  T4   Left Shoulder Flexion 167 Degrees   Left Shoulder ABduction 169 Degrees   Left Shoulder Internal Rotation --  T7   Left Shoulder External Rotation --  T4     Strength   Right Shoulder Flexion 5/5   Right Shoulder External Rotation 5/5         [facility] Adult PT Treatment/Exercise - 03/17/17 0001      Shoulder Exercises: Standing   Horizontal ABduction Limitations bil band pulls with GTB x 10   External Rotation Strengthening;Both;10 reps  bil scap retraction with ER with GTB   Row Strengthening;Both;20 reps  bil scap retraction and bil low rows with GTB x 10 each   Other Standing Exercises wall push ups with plus x 10   Other Standing Exercises walk up wall with lift off with RTB x 10               PT Education - 03/17/17 1104    Education provided Yes   Education Details progressed strengthening HEP for  discharge; discharge plans   Person(s) Educated Patient   Methods Explanation;Demonstration   Comprehension Verbalized understanding;Returned demonstration          PT Short Term Goals - 03-20-17 0913      PT SHORT TERM GOAL #1   Title After 3 weeks patient will demonstrate improved strength in Rt shoulder flexion, Rt serratus anterior, and Rt external rotation by half a MMT grade.    Status Achieved     PT SHORT TERM GOAL #2   Title After 3 weeks patient will demonstrate improved Rt shoulder A/ROM in internal rotation to T7 level.    Baseline T8 IR with RUE   Status Partially Met           PT Long Term Goals - 03/20/2017 0914      PT LONG TERM GOAL #1   Title After 6 weeks patient will demonstrate improved strength in Rt shoulder flexion, Rt serratus anterior, and Rt external rotation by one full MMT grade.    Status Achieved     PT LONG TERM GOAL #2   Title After 6 weeks  patient will report full performance of all self grouming, bathing, and dressing activity, pain free adn without limitation.    Status Achieved               Plan - March 20, 2017 0939    Clinical Impression Statement PT reassessed pt's ROM, MMT, and goals this session. Pt' AROM and MMT has improved significantly since beginning therapy, and she has met 3 and partially met 1 goal. Pt reports she feels 90-95% better since starting therapy, reporting that the remaining 5-10% is the intermittent twinges she feels at night, but these resolve relatively quickly on their own. Pt is able to perform all IADLs with no pain or difficulty, which she was having trouble doing prior to therapy. Due to progress made, pt will be discharged. She was given a progressed strengthening HEP and was educated in detail on how to perform the exercises. Pt verbalized understanding and also stated that she did not wish to follow through with dry needling.    Rehab Potential Good   PT Frequency 3x / week   PT Duration 6 weeks   PT Treatment/Interventions Dry needling;Patient/family education;ADLs/Self Care Home Management;Electrical Stimulation;Therapeutic activities;Therapeutic exercise;Moist Heat;Passive range of motion;Manual techniques   PT Home Exercise Plan *see handout   Consulted and Agree with Plan of Care Patient      Patient will benefit from skilled therapeutic intervention in order to improve the following deficits and impairments:  Hypomobility, Decreased activity tolerance, Postural dysfunction, Increased muscle spasms, Decreased strength, Increased fascial restricitons, Pain, Impaired UE functional use  Visit Diagnosis: Chronic right shoulder pain  Posture abnormality  Muscle weakness (generalized)       G-Codes - 2017-03-20 1051    Functional Assessment Tool Used (Outpatient Only) MMT, ROM, clinical judgement   Functional Limitation Mobility: Walking and moving around   Mobility: Walking and  Moving Around Current Status [phone]) At least 1 percent but less than 20 percent impaired, limited or restricted   Mobility: Walking and Moving Around Goal Status [phone]) At least 1 percent but less than 20 percent impaired, limited or restricted   Mobility: Walking and Moving Around Discharge Status [phone]) At least 1 percent but less than 20 percent impaired, limited or restricted      Problem List Patient Active Problem List   Diagnosis Date Noted  . Aortic stenosis, mild 11/14/2016  . Elevated alkaline  phosphatase level 05/17/2015  . Urethral diverticulum 10/14/2014  . Lichen sclerosus et atrophicus 10/14/2014  . Poorly controlled type 2 diabetes mellitus ([place]) 09/29/2014  . Insomnia 09/29/2014  . Other malaise and fatigue 04/04/2014  . Depression 04/04/2014  . Essential hypertension, benign 08/13/2013  . Hyperlipidemia 08/13/2013  . History of kidney stones 08/13/2013  . Elevated liver enzymes 08/13/2013  . Vulvar cancer ([place]) 04/12/2013     Geraldine Solar PT, [place] [address] [place], [place], [address] Phone: [phone]   Fax:  [phone]  Name: Christie [name] MRN: [id number] Date of Birth: 1946-01-05

## 2017-03-17 NOTE — Patient Instructions (Signed)
  WALL PUSH UPS  Standing at a wall, place your arms out in front of you with your elbows Sable Feil so that your hands just reach the wall.  Next, bend your elbows slowly to bring your chest closer to the wall. Maintain your feet planted on the ground the entire time. Add an extra push at the end when your arms are straight (what you were trying to do when on your hands and knees)   Scapular Retraction with External Rotation   Sit or Standing holding a theraband in your hands, palms facing up. Keep your elbows pulled in at your sides and squeeze your shoulder blades back and together, allowing your arms to rotate out to the side.   ELASTIC BAND HORIZONTAL ABDUCTION  While holding an elastic band in front of you and elbows straight, pull the band outward away from your body as shown.   Wall Y with [place]  Stand at wall with shoulders down and back and forearms against wall.  Slide arms up wall to form a Y. Then, squeeze shoulder blades together and lift arms off of wall. Hold 5 seconds, then return arms back to wall and lower back down. When this gets easy, add red band around wrists and keep your arms spread apart.   Prone Shoulder Flexion  -Lay on bed/mat with arm hanging off side -lift arm up towards head while keeping shoulder back and down and without using upper traps   PRONE Y  Lying face down with your arms stretched out upwards as shown, slowly move your arms upward towards the ceiling as you squeeze your shoulder blades downward and towards your spine.    Prone T's Straight  Prone Scapular Retraction - T's with Straight Arms  Lying face-down with your arms straight, bring your arms up by squeezing your shoulder blades together. Do not let your shoulders ride up to your ears, by engaging the muscles between your shoulder blades.   ELASTIC BAND SCAPULAR RETRACTIONS WITH MINI SHOULDER EXTENSIONS  While holding an elastic band with both arms in front of you with your  elbows straight, squeeze your shoulder blades together as you pull the band back. Be sure your shoulders do not raise up.    Scapular Retraction  Start: Position your arm at 90 degrees by your side with Theraband in hand as pictured.  Movement: Against the resistance of the band, squeeze your shoulder blades together as you stick your chest out. Slow and controlled movement. return to start position.  *Note-you should not be pulling with your arms, this will only round your shoulders. The movement we want her is initiated by your shoulder blades, your arms are just holding the resistance.   Pick 3-4 exercises to perform each day and then do the other exercises the next day. Perform 1x/day, 3 sets of 10 reps each. Feel free to mix and match exercises each week!

## 2017-03-20 ENCOUNTER — Encounter (HOSPITAL_COMMUNITY): Payer: Medicare Other | Admitting: Physical Therapy

## 2017-03-21 ENCOUNTER — Ambulatory Visit (HOSPITAL_COMMUNITY): Payer: Medicare Other

## 2017-03-22 ENCOUNTER — Encounter (HOSPITAL_COMMUNITY): Payer: Medicare Other

## 2017-03-23 ENCOUNTER — Encounter (HOSPITAL_COMMUNITY): Payer: Medicare Other

## 2017-03-24 ENCOUNTER — Encounter (HOSPITAL_COMMUNITY): Payer: Medicare Other | Admitting: Physical Therapy

## 2017-03-27 ENCOUNTER — Encounter (HOSPITAL_COMMUNITY): Payer: Medicare Other | Admitting: Physical Therapy

## 2017-03-29 ENCOUNTER — Encounter (HOSPITAL_COMMUNITY): Payer: Medicare Other | Admitting: Physical Therapy

## 2017-03-31 ENCOUNTER — Encounter (HOSPITAL_COMMUNITY): Payer: Medicare Other

## 2017-04-03 ENCOUNTER — Encounter (HOSPITAL_COMMUNITY): Payer: Medicare Other | Admitting: Physical Therapy

## 2017-04-05 ENCOUNTER — Encounter (HOSPITAL_COMMUNITY): Payer: Medicare Other | Admitting: Physical Therapy

## 2017-04-07 ENCOUNTER — Encounter (HOSPITAL_COMMUNITY): Payer: Medicare Other | Admitting: Physical Therapy

## 2017-04-17 ENCOUNTER — Telehealth: Payer: Self-pay | Admitting: Family Medicine

## 2017-04-17 DIAGNOSIS — E119 Type 2 diabetes mellitus without complications: Secondary | ICD-10-CM

## 2017-04-17 DIAGNOSIS — Z79899 Other long term (current) drug therapy: Secondary | ICD-10-CM

## 2017-04-17 DIAGNOSIS — E785 Hyperlipidemia, unspecified: Secondary | ICD-10-CM

## 2017-04-17 NOTE — Telephone Encounter (Signed)
Orders ready. Pt notified.  

## 2017-04-17 NOTE — Telephone Encounter (Signed)
Patient is requesting lab papers so she can get them done this week.

## 2017-04-17 NOTE — Telephone Encounter (Signed)
Lipid, liver, metabolic 7, hemoglobin C1U

## 2017-04-20 ENCOUNTER — Other Ambulatory Visit: Payer: Self-pay | Admitting: Family Medicine

## 2017-04-20 ENCOUNTER — Other Ambulatory Visit: Payer: Self-pay | Admitting: Neurosurgery

## 2017-04-21 LAB — HEPATIC FUNCTION PANEL
ALT: 38 IU/L — ABNORMAL HIGH (ref 0–32)
AST: 31 IU/L (ref 0–40)
Albumin: 4.7 g/dL (ref 3.5–4.8)
Alkaline Phosphatase: 148 IU/L — ABNORMAL HIGH (ref 39–117)
Bilirubin Total: 0.5 mg/dL (ref 0.0–1.2)
Bilirubin, Direct: 0.2 mg/dL (ref 0.00–0.40)
TOTAL PROTEIN: 6.9 g/dL (ref 6.0–8.5)

## 2017-04-21 LAB — LIPID PANEL
CHOL/HDL RATIO: 2.4 ratio (ref 0.0–4.4)
Cholesterol, Total: 169 mg/dL (ref 100–199)
HDL: 71 mg/dL (ref >39)
LDL Calculated: 71 mg/dL (ref 0–99)
Triglycerides: 137 mg/dL (ref 0–149)
VLDL CHOLESTEROL CAL: 27 mg/dL (ref 5–40)

## 2017-04-21 LAB — BASIC METABOLIC PANEL WITH GFR
BUN/Creatinine Ratio: 29 — ABNORMAL HIGH (ref 12–28)
BUN: 18 mg/dL (ref 8–27)
CO2: 27 mmol/L (ref 18–29)
Calcium: 9.9 mg/dL (ref 8.7–10.3)
Chloride: 102 mmol/L (ref 96–106)
Creatinine, Ser: 0.63 mg/dL (ref 0.57–1.00)
GFR calc Af Amer: 104 mL/min/1.73 (ref >59)
GFR calc non Af Amer: 90 mL/min/1.73 (ref >59)
Glucose: 144 mg/dL — ABNORMAL HIGH (ref 65–99)
Potassium: 3.9 mmol/L (ref 3.5–5.2)
Sodium: 146 mmol/L — ABNORMAL HIGH (ref 134–144)

## 2017-04-21 LAB — HEMOGLOBIN A1C
Est. average glucose Bld gHb Est-mCnc: 151 mg/dL
Hgb A1c MFr Bld: 6.9 % — ABNORMAL HIGH (ref 4.8–5.6)

## 2017-04-24 ENCOUNTER — Ambulatory Visit (INDEPENDENT_AMBULATORY_CARE_PROVIDER_SITE_OTHER): Payer: Medicare Other | Admitting: Family Medicine

## 2017-04-24 ENCOUNTER — Encounter: Payer: Self-pay | Admitting: Family Medicine

## 2017-04-24 VITALS — BP 140/76 | Ht 64.0 in | Wt 185.4 lb

## 2017-04-24 DIAGNOSIS — E119 Type 2 diabetes mellitus without complications: Secondary | ICD-10-CM

## 2017-04-24 DIAGNOSIS — G4709 Other insomnia: Secondary | ICD-10-CM

## 2017-04-24 DIAGNOSIS — E782 Mixed hyperlipidemia: Secondary | ICD-10-CM | POA: Diagnosis not present

## 2017-04-24 DIAGNOSIS — I10 Essential (primary) hypertension: Secondary | ICD-10-CM | POA: Diagnosis not present

## 2017-04-24 MED ORDER — LOSARTAN POTASSIUM 100 MG PO TABS: 100.0000 mg | ORAL_TABLET | Freq: Every day | ORAL | 6 refills | Status: DC

## 2017-04-24 MED ORDER — HYDROCODONE-ACETAMINOPHEN 10-325 MG PO TABS: ORAL_TABLET | ORAL | 0 refills | Status: DC

## 2017-04-24 MED ORDER — INDAPAMIDE 2.5 MG PO TABS: 2.5000 mg | ORAL_TABLET | Freq: Every day | ORAL | 6 refills | Status: DC

## 2017-04-24 MED ORDER — GLIPIZIDE 5 MG PO TABS: ORAL_TABLET | ORAL | 5 refills | Status: DC

## 2017-04-24 MED ORDER — ROSUVASTATIN CALCIUM 20 MG PO TABS: 20.0000 mg | ORAL_TABLET | Freq: Every day | ORAL | 5 refills | Status: DC

## 2017-04-24 MED ORDER — CITALOPRAM HYDROBROMIDE 20 MG PO TABS: 20.0000 mg | ORAL_TABLET | Freq: Every day | ORAL | 0 refills | Status: DC

## 2017-04-24 NOTE — Progress Notes (Signed)
   Subjective:    Patient ID: Christie [name], female    DOB: 1946/11/12, 70 y.o.   MRN: [id number]  Hypertension  This is a chronic problem. The current episode started more than 1 year ago. Pertinent negatives include no chest pain.   Patient has concerns of back pain would like a handicap placecard form filled out.    Review of Systems  Constitutional: Negative for activity change, appetite change and fatigue.  HENT: Negative for congestion.   Respiratory: Negative for cough.   Cardiovascular: Negative for chest pain.  Gastrointestinal: Negative for abdominal pain.  Endocrine: Negative for polydipsia and polyphagia.  Neurological: Negative for weakness.  Psychiatric/Behavioral: Negative for confusion.       Objective:   Physical Exam  Constitutional: She appears well-nourished. No distress.  Cardiovascular: Normal rate, regular rhythm and normal heart sounds.   No murmur heard. Pulmonary/Chest: Effort normal and breath sounds normal. No respiratory distress.  Musculoskeletal: She exhibits no edema.  Lymphadenopathy:    She has no cervical adenopathy.  Neurological: She is alert. She exhibits normal muscle tone.  Psychiatric: Her behavior is normal.  Vitals reviewed.  25 minutes was spent with the patient. Greater than half the time was spent in discussion and answering questions and counseling regarding the issues that the patient came in for today.  The patient will be having back surgery coming up in July. She will need her wellness exam this summer she has a history of vulvar cancer In addition to this I believe the patient is physically fit for surgery. She would need to stop her glipizide one day before surgery. Continue her other medications. No aspirin 7 days before surgery       Assessment & Plan:  Diabetes-good control overall continue current measures. Watch diet closely exercise.  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

## 2017-05-10 ENCOUNTER — Other Ambulatory Visit: Payer: Self-pay | Admitting: Family Medicine

## 2017-05-11 NOTE — Telephone Encounter (Signed)
She may have this +5 refills

## 2017-05-22 ENCOUNTER — Other Ambulatory Visit: Payer: Self-pay | Admitting: Family Medicine

## 2017-06-01 ENCOUNTER — Ambulatory Visit (INDEPENDENT_AMBULATORY_CARE_PROVIDER_SITE_OTHER): Payer: Medicare Other | Admitting: Family Medicine

## 2017-06-01 ENCOUNTER — Telehealth: Payer: Self-pay | Admitting: Family Medicine

## 2017-06-01 ENCOUNTER — Encounter: Payer: Self-pay | Admitting: Family Medicine

## 2017-06-01 VITALS — BP 128/66 | Ht 63.5 in | Wt 183.2 lb

## 2017-06-01 DIAGNOSIS — Z124 Encounter for screening for malignant neoplasm of cervix: Secondary | ICD-10-CM

## 2017-06-01 DIAGNOSIS — E119 Type 2 diabetes mellitus without complications: Secondary | ICD-10-CM | POA: Diagnosis not present

## 2017-06-01 DIAGNOSIS — Z0000 Encounter for general adult medical examination without abnormal findings: Secondary | ICD-10-CM

## 2017-06-01 DIAGNOSIS — Z780 Asymptomatic menopausal state: Secondary | ICD-10-CM | POA: Diagnosis not present

## 2017-06-01 NOTE — Progress Notes (Signed)
   Subjective:    Patient ID: Christie [name], female    DOB: October 31, 1946, 71 y.o.   MRN: [id number]  HPI AWV- Annual Wellness Visit She does suffer at times of feeling down but she denies being depressed She does try to keep her self interactive with others and does get out some She does try to eat relatively healthy She denies any type of chest pains or shortness breath denies rectal bleeding. She does have history of vulvar cancer removed in 1999 it is recommended for her to get a yearly wellness exam plus Pap smear by her oncology specialist The patient was seen for their annual wellness visit. The patient's past medical history, surgical history, and family history were reviewed. Pertinent vaccines were reviewed ( tetanus, pneumonia, shingles, flu) The patient's medication list was reviewed and updated.  The height and weight were entered. The patient's current BMI is:31  Cognitive screening was completed. Outcome of Mini - Cog: Pass  Falls within the past 6 months: None   Current tobacco usage: None  (All patients who use tobacco were given written and verbal information on quitting)  Recent listing of emergency department/hospitalizations over the past year were reviewed.  current specialist the patient sees on a regular basis: Neurosurgeon. Dr. Erline Levine   Surgery July 17th Medicare annual wellness visit patient questionnaire was reviewed.  A written screening schedule for the patient for the next 5-10 years was given. Appropriate discussion of followup regarding next visit was discussed.    Patient has concerns of elevated blood sugars.   Review of Systems  Constitutional: Negative for activity change, appetite change and fatigue.  HENT: Negative for congestion.   Respiratory: Negative for cough.   Cardiovascular: Negative for chest pain.  Gastrointestinal: Negative for abdominal pain.  Endocrine: Negative for polydipsia and polyphagia.  Neurological: Negative for  weakness.  Psychiatric/Behavioral: Negative for confusion.       Objective:   Physical Exam  Constitutional: She appears well-nourished. No distress.  Cardiovascular: Normal rate, regular rhythm and normal heart sounds.   No murmur heard. Pulmonary/Chest: Effort normal and breath sounds normal. No respiratory distress.  Musculoskeletal: She exhibits no edema.  Lymphadenopathy:    She has no cervical adenopathy.  Neurological: She is alert. She exhibits normal muscle tone.  Psychiatric: Her behavior is normal.  Vitals reviewed.         Assessment & Plan:  Adult wellness-complete.wellness physical was conducted today. Importance of diet and exercise were discussed in detail. In addition to this a discussion regarding safety was also covered. We also reviewed over immunizations and gave recommendations regarding current immunization needed for age. In addition to this additional areas were also touched on including: Preventative health exams needed: Colonoscopy 2015  History of vulvar cancer patient has a papule skin tag in the vagina which is stable been present on previous exams and present with her oncology surgeon, Pap smear taken unlikely show any problems  Patient was advised yearly wellness exam  Diabetes-recent A1c looked good recent glucose readings over the past week and then elevated she thinks its because her drugstore changed manufacturers on her glipizide she will record her sugar readings over the course of next 2 weeks and send them back to [place] she is aware diabetes his degenerative condition that could eventually require insulin  She has surgery coming up in July she is approved for this

## 2017-06-01 NOTE — Telephone Encounter (Signed)
Pt notiifed 

## 2017-06-01 NOTE — Telephone Encounter (Signed)
Left message return call [facility] 06/09/2017 at 1:15PM for 1:30 PM.

## 2017-06-03 LAB — PAP IG W/ RFLX HPV ASCU: PAP SMEAR COMMENT: 0

## 2017-06-04 ENCOUNTER — Encounter: Payer: Self-pay | Admitting: Family Medicine

## 2017-06-09 ENCOUNTER — Ambulatory Visit (HOSPITAL_COMMUNITY)
Admission: RE | Admit: 2017-06-09 | Discharge: 2017-06-09 | Disposition: A | Discharge status: Home / Self Care | Payer: Medicare Other | Source: Ambulatory Visit | Attending: Family Medicine | Admitting: Family Medicine

## 2017-06-09 DIAGNOSIS — Z780 Asymptomatic menopausal state: Secondary | ICD-10-CM | POA: Insufficient documentation

## 2017-06-09 DIAGNOSIS — M85852 Other specified disorders of bone density and structure, left thigh: Secondary | ICD-10-CM | POA: Insufficient documentation

## 2017-06-10 ENCOUNTER — Encounter: Payer: Self-pay | Admitting: Family Medicine

## 2017-06-18 ENCOUNTER — Telehealth: Payer: Self-pay | Admitting: Family Medicine

## 2017-06-18 NOTE — Telephone Encounter (Signed)
Nurse's-the patient sent glucose readings over the past 7 days from June 15 through June 22. Please let the patient know that her elevated glucose in the morning is a sign that her pancreas is not producing enough insulin to keep sugars in a good range. Her numbers later in the day look good. Based on her current readings I would not increase the dose of any of her medicines. She is on the maximum dose of oral medicines area for now I would maintain a low starch diet and activity as tolerated. She is approved for her surgery. We can send surgical clearance to her surgeon. Please find out who is doing her surgery and confirmed the date. Please let the patient know it is important to do regular follow-ups. If her glucose readings go up in this several weeks following her surgery she more than likely will need to be started on insulin. If any problems or questions let me know. The patient's cell phone number is [phone]

## 2017-06-19 ENCOUNTER — Ambulatory Visit (INDEPENDENT_AMBULATORY_CARE_PROVIDER_SITE_OTHER): Payer: Medicare Other | Admitting: Family Medicine

## 2017-06-19 VITALS — BP 120/70 | Ht 63.5 in | Wt 181.4 lb

## 2017-06-19 DIAGNOSIS — E119 Type 2 diabetes mellitus without complications: Secondary | ICD-10-CM

## 2017-06-19 MED ORDER — GLIPIZIDE 5 MG PO TABS: ORAL_TABLET | ORAL | 5 refills | Status: DC

## 2017-06-19 NOTE — Telephone Encounter (Signed)
Left message to return call 

## 2017-06-19 NOTE — Telephone Encounter (Signed)
Dr Nicki Reaper spoke with patient face to face at her office visit this am

## 2017-06-19 NOTE — Progress Notes (Signed)
   Subjective:    Patient ID: Christie [name], female    DOB: 1946/03/15, 71 y.o.   MRN: [id number]  Diabetes  She presents for her follow-up diabetic visit. She has type 2 diabetes mellitus. Pertinent negatives for diabetes include no chest pain and no fatigue.    Patient in today for elevated glucose readings.  Review of Systems  Constitutional: Negative for fatigue and fever.  HENT: Negative for congestion.   Respiratory: Negative for cough.   Cardiovascular: Negative for chest pain.  Gastrointestinal: Negative for abdominal pain.       Objective:   Physical Exam 1520 minute discussion was had regarding progression of the diabetes and the elevated glucose readings She has an upcoming surgery in 2 weeks She believes that the medication that was given to her from her pharmacy was a different manufacturer and therefore is causing her sugars, she does not one to start insulin currently     Assessment & Plan:   diabetes subpar control she is safe for surgery She is going to try different manufacturer the glipizide but if her numbers do not improve she more than likely will need to be on the evening shot of insulin  She will send [place] an update regarding her glucose readings in one week

## 2017-06-20 ENCOUNTER — Other Ambulatory Visit: Payer: Self-pay | Admitting: Family Medicine

## 2017-06-26 ENCOUNTER — Encounter (HOSPITAL_COMMUNITY)
Admission: RE | Admit: 2017-06-26 | Discharge: 2017-06-26 | Disposition: A | Discharge status: Home / Self Care | Payer: Medicare Other | Source: Ambulatory Visit | Attending: Neurosurgery | Admitting: Neurosurgery

## 2017-06-26 ENCOUNTER — Encounter (HOSPITAL_COMMUNITY): Payer: Self-pay

## 2017-06-26 ENCOUNTER — Other Ambulatory Visit (HOSPITAL_COMMUNITY): Payer: Self-pay | Admitting: *Deleted

## 2017-06-26 DIAGNOSIS — Z01818 Encounter for other preprocedural examination: Secondary | ICD-10-CM | POA: Diagnosis present

## 2017-06-26 HISTORY — DX: Depression, unspecified: F32.A

## 2017-06-26 HISTORY — DX: Major depressive disorder, single episode, unspecified: F32.9

## 2017-06-26 HISTORY — DX: Personal history of urinary calculi: Z87.442

## 2017-06-26 LAB — CBC
HCT: 45.6 % (ref 36.0–46.0)
Hemoglobin: 15.2 g/dL — ABNORMAL HIGH (ref 12.0–15.0)
MCH: 30.6 pg (ref 26.0–34.0)
MCHC: 33.3 g/dL (ref 30.0–36.0)
MCV: 91.8 fL (ref 78.0–100.0)
PLATELETS: 180 10*3/uL (ref 150–400)
RBC: 4.97 MIL/uL (ref 3.87–5.11)
RDW: 14.1 % (ref 11.5–15.5)
WBC: 7.7 10*3/uL (ref 4.0–10.5)

## 2017-06-26 LAB — COMPREHENSIVE METABOLIC PANEL WITH GFR
ALT: 41 U/L (ref 14–54)
AST: 37 U/L (ref 15–41)
Albumin: 4.2 g/dL (ref 3.5–5.0)
Alkaline Phosphatase: 106 U/L (ref 38–126)
Anion gap: 11 (ref 5–15)
BUN: 21 mg/dL — ABNORMAL HIGH (ref 6–20)
CO2: 26 mmol/L (ref 22–32)
Calcium: 10 mg/dL (ref 8.9–10.3)
Chloride: 101 mmol/L (ref 101–111)
Creatinine, Ser: 0.75 mg/dL (ref 0.44–1.00)
GFR calc Af Amer: >60 mL/min
GFR calc non Af Amer: >60 mL/min
Glucose, Bld: 159 mg/dL — ABNORMAL HIGH (ref 65–99)
Potassium: 3.4 mmol/L — ABNORMAL LOW (ref 3.5–5.1)
Sodium: 138 mmol/L (ref 135–145)
Total Bilirubin: 0.5 mg/dL (ref 0.3–1.2)
Total Protein: 7.1 g/dL (ref 6.5–8.1)

## 2017-06-26 LAB — SURGICAL PCR SCREEN
MRSA, PCR: NEGATIVE
Staphylococcus aureus: NEGATIVE

## 2017-06-26 LAB — ABO/RH: ABO/RH(D): A NEG

## 2017-06-26 LAB — TYPE AND SCREEN
ABO/RH(D): A NEG
Antibody Screen: NEGATIVE

## 2017-06-26 LAB — GLUCOSE, CAPILLARY: Glucose-Capillary: 196 mg/dL — ABNORMAL HIGH (ref 65–99)

## 2017-06-26 NOTE — Pre-Procedure Instructions (Signed)
CHAUNTA BEJARANO  06/26/2017    Your procedure is scheduled on Tuesday, July 04, 2017 at 7:30 AM.   Report to [facility] Entrance "A" Admitting Office at 5:30 AM.   Call this number if you have problems the morning of surgery: [phone]   Questions prior to day of surgery, please call [phone] between 8 & 4 PM.   Remember:  Do not eat food or drink liquids after midnight Monday, 07/03/17.  Take these medicines the morning of surgery with A SIP OF WATER: Citalopram (Celexa)  Stop Aspirin and Multivitamins 5 days prior to surgery. Do not use NSAIDS (Ibuprofen, Aleve, etc) 5 days prior to surgery.   How to Manage Your Diabetes Before Surgery   Why is it important to control my blood sugar before and after surgery?   Improving blood sugar levels before and after surgery helps healing and can limit problems.  A way of improving blood sugar control is eating a healthy diet by:  - Eating less sugar and carbohydrates  - Increasing activity/exercise  - Talk with your doctor about reaching your blood sugar goals  High blood sugars (greater than 180 mg/dL) can raise your risk of infections and slow down your recovery so you will need to focus on controlling your diabetes during the weeks before surgery.  Make sure that the doctor who takes care of your diabetes knows about your planned surgery including the date and location.  How do I manage my blood sugars before surgery?   Check your blood sugar at least 4 times a day, 2 days before surgery to make sure that they are not too high or low.  Check your blood sugar the morning of your surgery when you wake up and every 2 hours until you get to the Short-Stay unit.  Treat a low blood sugar (less than 70 mg/dL) with 1/2 cup of clear juice (cranberry or apple), 4 glucose tablets, OR glucose gel.  Recheck blood sugar in 15 minutes after treatment (to make sure it is greater than 70 mg/dL).  If blood sugar is not greater than  70 mg/dL on re-check, call [phone] for further instructions.   Report your blood sugar to the Short-Stay nurse when you get to Short-Stay.  References:  [facility], 2007 "How to Manage your Diabetes Before and After Surgery".  What do I do about my diabetes medications?   Do not take oral diabetes medicines (pills) the morning of surgery. Do not take Glipizide Monday evening (07/03/17) if you usually take it in the evening.   Do not wear jewelry, make-up or nail polish.  Do not wear lotions, powders, perfumes or deodorant.  Do not shave 48 hours prior to surgery.    Do not bring valuables to the hospital.  [facility] is not responsible for any belongings or valuables.  Contacts, dentures or bridgework may not be worn into surgery.  Leave your suitcase in the car.  After surgery it may be brought to your room.  For patients admitted to the hospital, discharge time will be determined by your treatment team.   [facility] - Preparing for Surgery  Before surgery, you can play an important role.  Because skin is not sterile, your skin needs to be as free of germs as possible.  You can reduce the number of germs on you skin by washing with CHG (chlorahexidine gluconate) soap before surgery.  CHG is an antiseptic cleaner which kills germs  and bonds with the skin to continue killing germs even after washing.  Please DO NOT use if you have an allergy to CHG or antibacterial soaps.  If your skin becomes reddened/irritated stop using the CHG and inform your nurse when you arrive at Short Stay.  Do not shave (including legs and underarms) for at least 48 hours prior to the first CHG shower.  You may shave your face.  Please follow these instructions carefully:   1.  Shower with CHG Soap the night before surgery and the                    morning of Surgery.  2.  If you choose to wash your hair, wash your hair first as usual with your       normal shampoo.  3.   After you shampoo, rinse your hair and body thoroughly to remove the shampoo.  4.  Use CHG as you would any other liquid soap.  You can apply chg directly       to the skin and wash gently with scrungie or a clean washcloth.  5.  Apply the CHG Soap to your body ONLY FROM THE NECK DOWN.        Do not use on open wounds or open sores.  Avoid contact with your eyes, ears, mouth and genitals (private parts).  Wash genitals (private parts) with your normal soap.  6.  Wash thoroughly, paying special attention to the area where your surgery        will be performed.  7.  Thoroughly rinse your body with warm water from the neck down.  8.  DO NOT shower/wash with your normal soap after using and rinsing off       the CHG Soap.  9.  Pat yourself dry with a clean towel.            10.  Wear clean pajamas.            11.  Place clean sheets on your bed the night of your first shower and do not        sleep with pets.  Day of Surgery  Do not apply any lotions/deodorants the morning of surgery.  Please wear clean clothes to the hospital.   Please read over the fact sheets that you were given.

## 2017-06-26 NOTE — Progress Notes (Signed)
Pt denies cardiac history, chest pain or sob. Pt is a diabetic, type 2. Last A1C was 6.9 on 04/20/17. Pt states her fasting blood sugar is usually between 140-150.

## 2017-06-27 ENCOUNTER — Telehealth: Payer: Self-pay | Admitting: Family Medicine

## 2017-06-27 NOTE — Telephone Encounter (Signed)
Patient dropped off a letter for you to review regarding her glipizide Rx.

## 2017-06-29 ENCOUNTER — Telehealth: Payer: Self-pay | Admitting: Family Medicine

## 2017-06-29 ENCOUNTER — Encounter: Payer: Self-pay | Admitting: Family Medicine

## 2017-06-29 ENCOUNTER — Other Ambulatory Visit: Payer: Self-pay | Admitting: *Deleted

## 2017-06-29 MED ORDER — GLIPIZIDE 5 MG PO TABS: ORAL_TABLET | ORAL | 5 refills | Status: DC

## 2017-06-29 NOTE — Telephone Encounter (Signed)
Nurse's-please send in new prescription glipizide plain tablets not XL. 5 mg tablet. Take 2 in the morning take two and a half (2.5 tablets) in the evening as directed. Send then month supply with new refills to replace previous prescription. Patient is aware to send [place] some glucose readings one week after her surgery-[facility]

## 2017-06-29 NOTE — Telephone Encounter (Signed)
I have discussed with the patient her diabetes. She will be using increase glipizide in the evening as noted in Epic. A letter was dictated for Dr. Vertell Limber. Patient advised to hold off on glipizide the day before surgery and it is quite possible the hospitalist will need to manage her diabetes while in the hospital with insulin. Please mail/fax a letter to Dr. Vertell Limber on Friday thank you

## 2017-06-29 NOTE — Telephone Encounter (Signed)
rx faxed  Pt notified

## 2017-06-29 NOTE — Telephone Encounter (Signed)
Went to [facility] and rx from today was not there.  Looks like it might have printed instead of going electronic.

## 2017-06-29 NOTE — Telephone Encounter (Signed)
Med sent to pharm with new directions.

## 2017-07-03 NOTE — Progress Notes (Signed)
Pt called to verify of time change informed pt to arrive at [place] for a 0915 surgery. Voices understanding.

## 2017-07-04 ENCOUNTER — Encounter (HOSPITAL_COMMUNITY): Payer: Self-pay

## 2017-07-04 ENCOUNTER — Encounter (HOSPITAL_COMMUNITY)
Admission: RE | Disposition: A | Discharge status: Home / Self Care | Payer: Self-pay | Source: Ambulatory Visit | Attending: Neurosurgery

## 2017-07-04 ENCOUNTER — Inpatient Hospital Stay (HOSPITAL_COMMUNITY)
Admission: RE | Admit: 2017-07-04 | Discharge: 2017-07-06 | DRG: 460 | Disposition: A | Discharge status: Home / Self Care | Payer: Medicare Other | Source: Ambulatory Visit | Attending: Neurosurgery | Admitting: Neurosurgery

## 2017-07-04 ENCOUNTER — Inpatient Hospital Stay (HOSPITAL_COMMUNITY): Payer: Medicare Other | Admitting: Certified Registered Nurse Anesthetist

## 2017-07-04 ENCOUNTER — Inpatient Hospital Stay (HOSPITAL_COMMUNITY): Payer: Medicare Other

## 2017-07-04 DIAGNOSIS — M4316 Spondylolisthesis, lumbar region: Secondary | ICD-10-CM | POA: Diagnosis present

## 2017-07-04 DIAGNOSIS — M5116 Intervertebral disc disorders with radiculopathy, lumbar region: Secondary | ICD-10-CM | POA: Diagnosis present

## 2017-07-04 DIAGNOSIS — M48061 Spinal stenosis, lumbar region without neurogenic claudication: Secondary | ICD-10-CM | POA: Diagnosis present

## 2017-07-04 DIAGNOSIS — Z7982 Long term (current) use of aspirin: Secondary | ICD-10-CM

## 2017-07-04 DIAGNOSIS — Z419 Encounter for procedure for purposes other than remedying health state, unspecified: Secondary | ICD-10-CM

## 2017-07-04 DIAGNOSIS — M4120 Other idiopathic scoliosis, site unspecified: Secondary | ICD-10-CM | POA: Diagnosis present

## 2017-07-04 DIAGNOSIS — Z886 Allergy status to analgesic agent status: Secondary | ICD-10-CM

## 2017-07-04 DIAGNOSIS — Z7984 Long term (current) use of oral hypoglycemic drugs: Secondary | ICD-10-CM | POA: Diagnosis not present

## 2017-07-04 DIAGNOSIS — M549 Dorsalgia, unspecified: Secondary | ICD-10-CM | POA: Diagnosis present

## 2017-07-04 DIAGNOSIS — Z888 Allergy status to other drugs, medicaments and biological substances status: Secondary | ICD-10-CM

## 2017-07-04 DIAGNOSIS — Z885 Allergy status to narcotic agent status: Secondary | ICD-10-CM

## 2017-07-04 DIAGNOSIS — M419 Scoliosis, unspecified: Secondary | ICD-10-CM | POA: Diagnosis present

## 2017-07-04 HISTORY — PX: ANTERIOR LATERAL LUMBAR FUSION WITH PERCUTANEOUS SCREW 3 LEVEL: SHX5555

## 2017-07-04 HISTORY — PX: ANTERIOR LAT LUMBAR FUSION: SHX1168

## 2017-07-04 LAB — GLUCOSE, CAPILLARY
GLUCOSE-CAPILLARY: 201 mg/dL — AB (ref 65–99)
GLUCOSE-CAPILLARY: 250 mg/dL — AB (ref 65–99)
GLUCOSE-CAPILLARY: 219 mg/dL — AB (ref 65–99)
Glucose-Capillary: 217 mg/dL — ABNORMAL HIGH (ref 65–99)
Glucose-Capillary: 294 mg/dL — ABNORMAL HIGH (ref 65–99)
Glucose-Capillary: 307 mg/dL — ABNORMAL HIGH (ref 65–99)

## 2017-07-04 SURGERY — ANTERIOR LATERAL LUMBAR FUSION 3 LEVELS
Anesthesia: General | Site: Spine Lumbar | Laterality: Right

## 2017-07-04 MED ORDER — LIDOCAINE-EPINEPHRINE 1 %-1:100000 IJ SOLN
INTRAMUSCULAR | Status: AC
  Filled 2017-07-04: qty 1

## 2017-07-04 MED ORDER — ACETAMINOPHEN 500 MG PO TABS: 500.0000 mg | ORAL_TABLET | Freq: Four times a day (QID) | ORAL | Status: DC | PRN

## 2017-07-04 MED ORDER — BISACODYL 10 MG RE SUPP: 10.0000 mg | Freq: Every day | RECTAL | Status: DC | PRN

## 2017-07-04 MED ORDER — HYDROMORPHONE HCL 1 MG/ML IJ SOLN
INTRAMUSCULAR | Status: AC
  Filled 2017-07-04: qty 0.5

## 2017-07-04 MED ORDER — ROSUVASTATIN CALCIUM 20 MG PO TABS
20.0000 mg | ORAL_TABLET | Freq: Every day | ORAL | Status: DC
  Administered 2017-07-04 – 2017-07-05 (×2): 20 mg via ORAL
  Filled 2017-07-04 (×2): qty 1

## 2017-07-04 MED ORDER — CEFAZOLIN SODIUM-DEXTROSE 2-4 GM/100ML-% IV SOLN
2.0000 g | INTRAVENOUS | Status: AC
  Administered 2017-07-04 (×2): 2 g via INTRAVENOUS
  Filled 2017-07-04: qty 100

## 2017-07-04 MED ORDER — ONDANSETRON HCL 4 MG/2ML IJ SOLN
INTRAMUSCULAR | Status: DC | PRN
  Administered 2017-07-04: 4 mg via INTRAVENOUS

## 2017-07-04 MED ORDER — ASPIRIN 81 MG PO CHEW
81.0000 mg | CHEWABLE_TABLET | Freq: Every day | ORAL | Status: DC
  Administered 2017-07-05 – 2017-07-06 (×2): 81 mg via ORAL
  Filled 2017-07-04 (×2): qty 1

## 2017-07-04 MED ORDER — SUCCINYLCHOLINE CHLORIDE 200 MG/10ML IV SOSY
PREFILLED_SYRINGE | INTRAVENOUS | Status: DC | PRN
  Administered 2017-07-04: 100 mg via INTRAVENOUS

## 2017-07-04 MED ORDER — DEXAMETHASONE SODIUM PHOSPHATE 10 MG/ML IJ SOLN
INTRAMUSCULAR | Status: DC | PRN
  Administered 2017-07-04: 10 mg via INTRAVENOUS

## 2017-07-04 MED ORDER — HYDROMORPHONE HCL 1 MG/ML IJ SOLN
0.2500 mg | INTRAMUSCULAR | Status: DC | PRN
  Administered 2017-07-04 (×2): 0.5 mg via INTRAVENOUS

## 2017-07-04 MED ORDER — BUPIVACAINE HCL (PF) 0.5 % IJ SOLN
INTRAMUSCULAR | Status: AC
  Filled 2017-07-04: qty 30

## 2017-07-04 MED ORDER — 0.9 % SODIUM CHLORIDE (POUR BTL) OPTIME
TOPICAL | Status: DC | PRN
  Administered 2017-07-04: 1000 mL

## 2017-07-04 MED ORDER — SODIUM CHLORIDE 0.9% FLUSH: 3.0000 mL | Freq: Two times a day (BID) | INTRAVENOUS | Status: DC

## 2017-07-04 MED ORDER — SODIUM CHLORIDE 0.9% FLUSH: 3.0000 mL | INTRAVENOUS | Status: DC | PRN

## 2017-07-04 MED ORDER — FENTANYL CITRATE (PF) 250 MCG/5ML IJ SOLN
INTRAMUSCULAR | Status: AC
  Filled 2017-07-04: qty 5

## 2017-07-04 MED ORDER — LIDOCAINE 2% (20 MG/ML) 5 ML SYRINGE
INTRAMUSCULAR | Status: DC | PRN
  Administered 2017-07-04: 20 mg via INTRAVENOUS

## 2017-07-04 MED ORDER — MIDAZOLAM HCL 2 MG/2ML IJ SOLN
INTRAMUSCULAR | Status: DC | PRN
  Administered 2017-07-04 (×2): 1 mg via INTRAVENOUS

## 2017-07-04 MED ORDER — MEPERIDINE HCL 25 MG/ML IJ SOLN: 6.2500 mg | INTRAMUSCULAR | Status: DC | PRN

## 2017-07-04 MED ORDER — CHLORHEXIDINE GLUCONATE CLOTH 2 % EX PADS: 6.0000 | MEDICATED_PAD | Freq: Once | CUTANEOUS | Status: DC

## 2017-07-04 MED ORDER — SCOPOLAMINE 1 MG/3DAYS TD PT72
MEDICATED_PATCH | TRANSDERMAL | Status: DC | PRN
  Administered 2017-07-04: 1 via TRANSDERMAL

## 2017-07-04 MED ORDER — KCL IN DEXTROSE-NACL 20-5-0.9 MEQ/L-%-% IV SOLN
INTRAVENOUS | Status: DC
  Filled 2017-07-04: qty 1000

## 2017-07-04 MED ORDER — DEXTROSE 5 % IV SOLN
INTRAVENOUS | Status: DC | PRN
  Administered 2017-07-04: 45 ug/min via INTRAVENOUS

## 2017-07-04 MED ORDER — EPHEDRINE SULFATE-NACL 50-0.9 MG/10ML-% IV SOSY
PREFILLED_SYRINGE | INTRAVENOUS | Status: DC | PRN
  Administered 2017-07-04: 5 mg via INTRAVENOUS

## 2017-07-04 MED ORDER — ALUM & MAG HYDROXIDE-SIMETH 200-200-20 MG/5ML PO SUSP: 30.0000 mL | Freq: Four times a day (QID) | ORAL | Status: DC | PRN

## 2017-07-04 MED ORDER — CEFAZOLIN SODIUM 1 G IJ SOLR
INTRAMUSCULAR | Status: AC
  Filled 2017-07-04: qty 20

## 2017-07-04 MED ORDER — ARTIFICIAL TEARS OPHTHALMIC OINT
TOPICAL_OINTMENT | OPHTHALMIC | Status: AC
  Filled 2017-07-04: qty 14

## 2017-07-04 MED ORDER — LOSARTAN POTASSIUM 50 MG PO TABS: 100.0000 mg | ORAL_TABLET | Freq: Every day | ORAL | Status: DC

## 2017-07-04 MED ORDER — METHOCARBAMOL 1000 MG/10ML IJ SOLN
500.0000 mg | Freq: Four times a day (QID) | INTRAMUSCULAR | Status: DC | PRN
  Filled 2017-07-04: qty 5

## 2017-07-04 MED ORDER — ALPRAZOLAM 0.5 MG PO TABS
0.5000 mg | ORAL_TABLET | Freq: Three times a day (TID) | ORAL | Status: DC | PRN
  Administered 2017-07-05: 0.5 mg via ORAL
  Filled 2017-07-04: qty 1

## 2017-07-04 MED ORDER — PHENOL 1.4 % MT LIQD: 1.0000 | OROMUCOSAL | Status: DC | PRN

## 2017-07-04 MED ORDER — PROPOFOL 10 MG/ML IV BOLUS
INTRAVENOUS | Status: AC
  Filled 2017-07-04: qty 20

## 2017-07-04 MED ORDER — MACULAR VITAMIN BENEFIT PO TABS: ORAL_TABLET | Freq: Every day | ORAL | Status: DC

## 2017-07-04 MED ORDER — ACETAMINOPHEN 325 MG PO TABS
650.0000 mg | ORAL_TABLET | ORAL | Status: DC | PRN
  Administered 2017-07-05: 650 mg via ORAL
  Filled 2017-07-04: qty 2

## 2017-07-04 MED ORDER — POLYETHYLENE GLYCOL 3350 17 G PO PACK: 17.0000 g | PACK | Freq: Every day | ORAL | Status: DC | PRN

## 2017-07-04 MED ORDER — LACTATED RINGERS IV SOLN
INTRAVENOUS | Status: DC
  Administered 2017-07-04 (×3): via INTRAVENOUS

## 2017-07-04 MED ORDER — SCOPOLAMINE 1 MG/3DAYS TD PT72
MEDICATED_PATCH | TRANSDERMAL | Status: AC
  Filled 2017-07-04: qty 1

## 2017-07-04 MED ORDER — CANAGLIFLOZIN 100 MG PO TABS
100.0000 mg | ORAL_TABLET | Freq: Every day | ORAL | Status: DC
  Administered 2017-07-05 – 2017-07-06 (×2): 100 mg via ORAL
  Filled 2017-07-04 (×2): qty 1

## 2017-07-04 MED ORDER — DOCUSATE SODIUM 100 MG PO CAPS
100.0000 mg | ORAL_CAPSULE | Freq: Two times a day (BID) | ORAL | Status: DC
  Administered 2017-07-04 – 2017-07-06 (×4): 100 mg via ORAL
  Filled 2017-07-04 (×4): qty 1

## 2017-07-04 MED ORDER — GLIPIZIDE 5 MG PO TABS
5.0000 mg | ORAL_TABLET | Freq: Two times a day (BID) | ORAL | Status: DC
  Administered 2017-07-04 – 2017-07-06 (×4): 5 mg via ORAL
  Filled 2017-07-04 (×5): qty 1

## 2017-07-04 MED ORDER — INSULIN ASPART 100 UNIT/ML ~~LOC~~ SOLN
0.0000 [IU] | Freq: Every day | SUBCUTANEOUS | Status: DC
  Administered 2017-07-04: 4 [IU] via SUBCUTANEOUS

## 2017-07-04 MED ORDER — INSULIN ASPART 100 UNIT/ML ~~LOC~~ SOLN: 0.0000 [IU] | Freq: Three times a day (TID) | SUBCUTANEOUS | Status: DC

## 2017-07-04 MED ORDER — THROMBIN 5000 UNITS EX SOLR
CUTANEOUS | Status: AC
  Filled 2017-07-04: qty 15000

## 2017-07-04 MED ORDER — HYDROCODONE-ACETAMINOPHEN 5-325 MG PO TABS
1.0000 | ORAL_TABLET | ORAL | Status: DC | PRN
  Administered 2017-07-04 – 2017-07-05 (×3): 2 via ORAL
  Filled 2017-07-04 (×3): qty 2

## 2017-07-04 MED ORDER — LIDOCAINE-EPINEPHRINE 1 %-1:100000 IJ SOLN
INTRAMUSCULAR | Status: DC | PRN
  Administered 2017-07-04 (×2): 7.5 mL

## 2017-07-04 MED ORDER — SODIUM CHLORIDE 0.9 % IV SOLN: 250.0000 mL | INTRAVENOUS | Status: DC

## 2017-07-04 MED ORDER — DEXAMETHASONE SODIUM PHOSPHATE 10 MG/ML IJ SOLN
INTRAMUSCULAR | Status: AC
  Filled 2017-07-04: qty 1

## 2017-07-04 MED ORDER — ARTIFICIAL TEARS OPHTHALMIC OINT
TOPICAL_OINTMENT | OPHTHALMIC | Status: DC | PRN
  Administered 2017-07-04: 1 via OPHTHALMIC

## 2017-07-04 MED ORDER — INDAPAMIDE 2.5 MG PO TABS
2.5000 mg | ORAL_TABLET | Freq: Every day | ORAL | Status: DC
  Administered 2017-07-05 – 2017-07-06 (×2): 2.5 mg via ORAL
  Filled 2017-07-04 (×3): qty 1

## 2017-07-04 MED ORDER — SOD FLUORIDE-CA CARBONATE 8.3-364 MG PO CAPS: 1.0000 | ORAL_CAPSULE | Freq: Two times a day (BID) | ORAL | Status: DC

## 2017-07-04 MED ORDER — FLEET ENEMA 7-19 GM/118ML RE ENEM
1.0000 | ENEMA | Freq: Once | RECTAL | Status: AC | PRN
  Administered 2017-07-06: 1 via RECTAL
  Filled 2017-07-04: qty 1

## 2017-07-04 MED ORDER — ACETAMINOPHEN 650 MG RE SUPP: 650.0000 mg | RECTAL | Status: DC | PRN

## 2017-07-04 MED ORDER — VITAMIN D 1000 UNITS PO TABS: 1000.0000 [IU] | ORAL_TABLET | Freq: Every day | ORAL | Status: DC

## 2017-07-04 MED ORDER — CENTRUM SILVER PO CHEW: CHEWABLE_TABLET | Freq: Every day | ORAL | Status: DC

## 2017-07-04 MED ORDER — MIDAZOLAM HCL 2 MG/2ML IJ SOLN: 0.5000 mg | Freq: Once | INTRAMUSCULAR | Status: DC | PRN

## 2017-07-04 MED ORDER — CEFAZOLIN SODIUM-DEXTROSE 2-4 GM/100ML-% IV SOLN
2.0000 g | Freq: Three times a day (TID) | INTRAVENOUS | Status: AC
  Administered 2017-07-04: 2 g via INTRAVENOUS
  Filled 2017-07-04: qty 100

## 2017-07-04 MED ORDER — ROCURONIUM BROMIDE 50 MG/5ML IV SOLN
INTRAVENOUS | Status: AC
  Filled 2017-07-04: qty 1

## 2017-07-04 MED ORDER — ONDANSETRON HCL 4 MG PO TABS: 4.0000 mg | ORAL_TABLET | Freq: Four times a day (QID) | ORAL | Status: DC | PRN

## 2017-07-04 MED ORDER — FENTANYL CITRATE (PF) 250 MCG/5ML IJ SOLN
INTRAMUSCULAR | Status: DC | PRN
Start: 2017-07-04 — End: 2017-07-04
  Administered 2017-07-04: 250 ug via INTRAVENOUS
  Administered 2017-07-04: 50 ug via INTRAVENOUS

## 2017-07-04 MED ORDER — ONDANSETRON HCL 4 MG/2ML IJ SOLN
4.0000 mg | Freq: Four times a day (QID) | INTRAMUSCULAR | Status: DC | PRN
  Administered 2017-07-04: 4 mg via INTRAVENOUS
  Filled 2017-07-04: qty 2

## 2017-07-04 MED ORDER — ZOLPIDEM TARTRATE 5 MG PO TABS: 5.0000 mg | ORAL_TABLET | Freq: Every evening | ORAL | Status: DC | PRN

## 2017-07-04 MED ORDER — PROPOFOL 10 MG/ML IV BOLUS
INTRAVENOUS | Status: DC | PRN
  Administered 2017-07-04: 120 mg via INTRAVENOUS

## 2017-07-04 MED ORDER — LIDOCAINE HCL (CARDIAC) 20 MG/ML IV SOLN
INTRAVENOUS | Status: AC
  Filled 2017-07-04: qty 5

## 2017-07-04 MED ORDER — PROMETHAZINE HCL 25 MG/ML IJ SOLN: 6.2500 mg | INTRAMUSCULAR | Status: DC | PRN

## 2017-07-04 MED ORDER — MENTHOL 3 MG MT LOZG: 1.0000 | LOZENGE | OROMUCOSAL | Status: DC | PRN

## 2017-07-04 MED ORDER — BUPIVACAINE LIPOSOME 1.3 % IJ SUSP
20.0000 mL | INTRAMUSCULAR | Status: AC
  Administered 2017-07-04: 20 mL
  Filled 2017-07-04: qty 20

## 2017-07-04 MED ORDER — CITALOPRAM HYDROBROMIDE 20 MG PO TABS
20.0000 mg | ORAL_TABLET | Freq: Every day | ORAL | Status: DC
  Administered 2017-07-04 – 2017-07-06 (×3): 20 mg via ORAL
  Filled 2017-07-04 (×4): qty 1

## 2017-07-04 MED ORDER — MORPHINE SULFATE (PF) 4 MG/ML IV SOLN
2.0000 mg | INTRAVENOUS | Status: DC | PRN
  Administered 2017-07-04 (×2): 2 mg via INTRAVENOUS
  Filled 2017-07-04 (×2): qty 1

## 2017-07-04 MED ORDER — ACETAMINOPHEN 10 MG/ML IV SOLN
INTRAVENOUS | Status: DC | PRN
  Administered 2017-07-04: 1000 mg via INTRAVENOUS

## 2017-07-04 MED ORDER — METHOCARBAMOL 500 MG PO TABS
500.0000 mg | ORAL_TABLET | Freq: Four times a day (QID) | ORAL | Status: DC | PRN
  Administered 2017-07-04 – 2017-07-06 (×6): 500 mg via ORAL
  Filled 2017-07-04 (×6): qty 1

## 2017-07-04 MED ORDER — INSULIN ASPART 100 UNIT/ML ~~LOC~~ SOLN
0.0000 [IU] | Freq: Three times a day (TID) | SUBCUTANEOUS | Status: DC
  Administered 2017-07-05 (×2): 3 [IU] via SUBCUTANEOUS
  Administered 2017-07-05: 4 [IU] via SUBCUTANEOUS
  Administered 2017-07-06: 3 [IU] via SUBCUTANEOUS

## 2017-07-04 MED ORDER — MIDAZOLAM HCL 2 MG/2ML IJ SOLN
INTRAMUSCULAR | Status: AC
  Filled 2017-07-04: qty 2

## 2017-07-04 MED ORDER — BUPIVACAINE HCL (PF) 0.5 % IJ SOLN
INTRAMUSCULAR | Status: DC | PRN
  Administered 2017-07-04 (×2): 7.5 mL

## 2017-07-04 SURGICAL SUPPLY — 78 items
ADH SKN CLS APL DERMABOND .7 (GAUZE/BANDAGES/DRESSINGS) ×10
BLADE CLIPPER SURG (BLADE) IMPLANT
CAGE MODULUS XL 10X18X55 - 10 (Cage) ×2 IMPLANT
CARTRIDGE OIL MAESTRO DRILL (MISCELLANEOUS) ×2 IMPLANT
CLIP NEUROVISION LG (CLIP) ×2 IMPLANT
COVER BACK TABLE 24X17X13 BIG (DRAPES) ×2 IMPLANT
DECANTER SPIKE VIAL GLASS SM (MISCELLANEOUS) ×2 IMPLANT
DERMABOND ADVANCED (GAUZE/BANDAGES/DRESSINGS) ×10
DERMABOND ADVANCED .7 DNX12 (GAUZE/BANDAGES/DRESSINGS) ×4 IMPLANT
DIFFUSER DRILL AIR PNEUMATIC (MISCELLANEOUS) ×2 IMPLANT
DRAPE C-ARM 42X72 X-RAY (DRAPES) ×6 IMPLANT
DRAPE C-ARMOR (DRAPES) ×6 IMPLANT
DRAPE LAPAROTOMY 100X72X124 (DRAPES) ×6 IMPLANT
DRAPE POUCH INSTRU U-SHP 10X18 (DRAPES) ×4 IMPLANT
DRSG OPSITE POSTOP 3X4 (GAUZE/BANDAGES/DRESSINGS) ×2 IMPLANT
DRSG OPSITE POSTOP 4X6 (GAUZE/BANDAGES/DRESSINGS) ×3 IMPLANT
DRSG OPSITE POSTOP 4X8 (GAUZE/BANDAGES/DRESSINGS) ×6 IMPLANT
DURAPREP 26ML APPLICATOR (WOUND CARE) ×4 IMPLANT
ELECT REM PT RETURN 9FT ADLT (ELECTROSURGICAL) ×8
ELECTRODE REM PT RTRN 9FT ADLT (ELECTROSURGICAL) ×2 IMPLANT
GAUZE SPONGE 4X4 16PLY XRAY LF (GAUZE/BANDAGES/DRESSINGS) ×3 IMPLANT
GLOVE BIO SURGEON STRL SZ8 (GLOVE) ×4 IMPLANT
GLOVE BIOGEL PI IND STRL 8 (GLOVE) ×2 IMPLANT
GLOVE BIOGEL PI IND STRL 8.5 (GLOVE) ×2 IMPLANT
GLOVE BIOGEL PI INDICATOR 8 (GLOVE) ×2
GLOVE BIOGEL PI INDICATOR 8.5 (GLOVE) ×2
GLOVE ECLIPSE 8.0 STRL XLNG CF (GLOVE) ×4 IMPLANT
GLOVE EXAM NITRILE LRG STRL (GLOVE) IMPLANT
GLOVE EXAM NITRILE XL STR (GLOVE) IMPLANT
GLOVE EXAM NITRILE XS STR PU (GLOVE) IMPLANT
GOWN STRL REUS W/ TWL LRG LVL3 (GOWN DISPOSABLE) IMPLANT
GOWN STRL REUS W/ TWL XL LVL3 (GOWN DISPOSABLE) ×4 IMPLANT
GOWN STRL REUS W/TWL 2XL LVL3 (GOWN DISPOSABLE) ×2 IMPLANT
GOWN STRL REUS W/TWL LRG LVL3 (GOWN DISPOSABLE) ×4
GOWN STRL REUS W/TWL XL LVL3 (GOWN DISPOSABLE) ×8
GUIDEWIRE NITINOL BEVEL TIP (WIRE) ×2 IMPLANT
IMPL COROENT XL 10D (Cage) IMPLANT
IMPLANT COROENT XL 10D (Cage) ×4 IMPLANT
KIT BASIN OR (CUSTOM PROCEDURE TRAY) ×4 IMPLANT
KIT DILATOR XLIF 5 (KITS) IMPLANT
KIT INFUSE X SMALL 1.4CC (Orthopedic Implant) ×2 IMPLANT
KIT INFUSE XX SMALL 0.7CC (Orthopedic Implant) ×2 IMPLANT
KIT ROOM TURNOVER OR (KITS) ×4 IMPLANT
KIT SURGICAL ACCESS MAXCESS 4 (KITS) ×3 IMPLANT
KIT XLIF (KITS) ×2
MARKER SKIN DUAL TIP RULER LAB (MISCELLANEOUS) ×2 IMPLANT
MODULE NVM5 NEXT GEN EMG (NEEDLE) ×3 IMPLANT
MODULUS XLW 10X22X50MM 10DEG (Spine Construct) ×2 IMPLANT
MODULUS XLW 10X22X55MM 10 (Spine Construct) ×2 IMPLANT
NDL HYPO 21X1.5 SAFETY (NEEDLE) IMPLANT
NDL HYPO 25X1 1.5 SAFETY (NEEDLE) ×1 IMPLANT
NDL I PASS (NEEDLE) IMPLANT
NEEDLE HYPO 21X1.5 SAFETY (NEEDLE) ×4 IMPLANT
NEEDLE HYPO 25X1 1.5 SAFETY (NEEDLE) ×4 IMPLANT
NEEDLE I PASS (NEEDLE) ×4 IMPLANT
NS IRRIG 1000ML POUR BTL (IV SOLUTION) ×4 IMPLANT
OIL CARTRIDGE MAESTRO DRILL (MISCELLANEOUS)
PACK LAMINECTOMY NEURO (CUSTOM PROCEDURE TRAY) ×6 IMPLANT
PUTTY BONE ATTRAX 10CC STRIP (Putty) ×2 IMPLANT
PUTTY BONE ATTRAX 5CC STRIP (Putty) ×2 IMPLANT
ROD RELINE MAS LORD 5.5X90MM (Rod) ×2 IMPLANT
ROD RELINE MAS LORD 5.5X95MM (Rod) ×2 IMPLANT
SCREW LOCK RELINE 5.5 TULIP (Screw) ×16 IMPLANT
SCREW MAS RELINE 6.5X45 POLY (Screw) ×18 IMPLANT
SCREW RELINE MAS POLY 5.5X45MM (Screw) ×4 IMPLANT
SCREW XL 50X5.5XVA NS SPNE (Screw) IMPLANT
SCREW XL VAR (Screw) ×8 IMPLANT
SPONGE LAP 4X18 X RAY DECT (DISPOSABLE) IMPLANT
STAPLER SKIN PROX WIDE 3.9 (STAPLE) ×4 IMPLANT
SUT VIC AB 1 CT1 18XBRD ANBCTR (SUTURE) ×5 IMPLANT
SUT VIC AB 1 CT1 8-18 (SUTURE) ×16
SUT VIC AB 2-0 CT1 18 (SUTURE) ×12 IMPLANT
SUT VIC AB 3-0 SH 8-18 (SUTURE) ×12 IMPLANT
SYR 20CC LL (SYRINGE) ×2 IMPLANT
TOWEL GREEN STERILE (TOWEL DISPOSABLE) ×4 IMPLANT
TOWEL GREEN STERILE FF (TOWEL DISPOSABLE) ×7 IMPLANT
TRAY FOLEY W/METER SILVER 16FR (SET/KITS/TRAYS/PACK) ×4 IMPLANT
WATER STERILE IRR 1000ML POUR (IV SOLUTION) ×4 IMPLANT

## 2017-07-04 NOTE — Interval H&P Note (Signed)
History and Physical Interval Note:  07/04/2017 9:32 AM  Angeline Slim  has presented today for surgery, with the diagnosis of Lumbar foraminal stenosis  The various methods of treatment have been discussed with the patient and family. After consideration of risks, benefits and other options for treatment, the patient has consented to  Procedure(s) with comments: Right L2-3 L3-4 L4-5 Anterior lateral lumbar interbody fusion with percutaneous pedicle screws (Right) - Right L2-3 L3-4 L4-5 Anterior lateral lumbar interbody fusion with percutaneous pedicle screws ANTERIOR LATERAL LUMBAR FUSION WITH PERCUTANEOUS SCREW 3 LEVEL (N/A) as a surgical intervention .  The patient's history has been reviewed, patient examined, no change in status, stable for surgery.  I have reviewed the patient's chart and labs.  Questions were answered to the patient's satisfaction.     Xayvier Vallez D

## 2017-07-04 NOTE — Anesthesia Preprocedure Evaluation (Addendum)
Anesthesia Evaluation  Patient identified by MRN, date of birth, ID band Patient awake    Reviewed: Allergy & Precautions, NPO status , Patient's Chart, lab work & pertinent test results  History of Anesthesia Complications (+) PONVNegative for: history of anesthetic complications  Airway Mallampati: II  TM Distance: >3 FB Neck ROM: Full    Dental  (+) Dental Advisory Given   Pulmonary neg pulmonary ROS,    breath sounds clear to auscultation       Cardiovascular hypertension, Pt. on medications (-) angina Rhythm:Regular Rate:Normal + Systolic murmurs (I/VI) '17 ECHO: EF 60-65%, mild Aortic stenosis. Trivial AI. Mean gradient (S): 11 mm Hg. Peak gradient (S): 21 mm Hg.    Neuro/Psych Depression Chronic back pain    GI/Hepatic negative GI ROS, H/o elevated LFTs   Endo/Other  diabetes (glu 217), Poorly Controlled, Oral Hypoglycemic AgentsMorbid obesity  Renal/GU negative Renal ROS   H/o vulvar cancer    Musculoskeletal   Abdominal (+) + obese,   Peds  Hematology negative hematology ROS (+)   Anesthesia Other Findings   Reproductive/Obstetrics                            Anesthesia Physical Anesthesia Plan  ASA: III  Anesthesia Plan: General   Post-op Pain Management:    Induction: Intravenous  PONV Risk Score and Plan: 4 or greater and Ondansetron, Dexamethasone, Midazolam and Scopolamine patch - Pre-op  Airway Management Planned: Oral ETT  Additional Equipment:   Intra-op Plan:   Post-operative Plan: Extubation in OR  Informed Consent: I have reviewed the patients History and Physical, chart, labs and discussed the procedure including the risks, benefits and alternatives for the proposed anesthesia with the patient or authorized representative who has indicated his/her understanding and acceptance.   Dental advisory given  Plan Discussed with: CRNA and Surgeon  Anesthesia  Plan Comments: (Plan routine monitors, GETA)        Anesthesia Quick Evaluation

## 2017-07-04 NOTE — Progress Notes (Signed)
Awake, alert, conversant.  MAEW with full power.  Denies numbness.  Doing well.

## 2017-07-04 NOTE — Anesthesia Procedure Notes (Signed)
Procedure Name: Intubation Date/Time: 07/04/2017 9:58 AM Performed by: Mervyn Gay Pre-anesthesia Checklist: Patient identified, Patient being monitored, Timeout performed, Emergency Drugs available and Suction available Patient Re-evaluated:Patient Re-evaluated prior to induction Oxygen Delivery Method: Circle System Utilized Preoxygenation: Pre-oxygenation with 100% oxygen Induction Type: IV induction Ventilation: Mask ventilation without difficulty Laryngoscope Size: 3 and Glidescope Grade View: Grade I Tube type: Oral Tube size: 7.5 mm Number of attempts: 1 Airway Equipment and Method: Stylet and Video-laryngoscopy Placement Confirmation: ETT inserted through vocal cords under direct vision,  positive ETCO2 and breath sounds checked- equal and bilateral Secured at: 21 cm Tube secured with: Tape Dental Injury: Teeth and Oropharynx as per pre-operative assessment

## 2017-07-04 NOTE — Transfer of Care (Signed)
Immediate Anesthesia Transfer of Care Note  Patient: Christie [name]  Procedure(s) Performed: Procedure(s) with comments: Right LUMBAR TWO-THREE LUMBAR THREE-FOUR LUMBAR FOUR-FIVE Anterior lateral lumbar interbody fusion with percutaneous pedicle screws (Right) - Right lateral approach  PERCUTANEOUS SCREW THREE LEVEL LUMBAR TWO-THREE LUMBAR THREE-FOUR LUMBAR FOUR-FIVE (N/A) - Prone  Patient Location: PACU  Anesthesia Type:General  Level of Consciousness: awake, alert , oriented and patient cooperative  Airway & Oxygen Therapy: Patient Spontanous Breathing and Patient connected to nasal cannula oxygen  Post-op Assessment: Report given to RN and Post -op Vital signs reviewed and stable  Post vital signs: Reviewed  Last Vitals: 128/78, 79, 18, 100% Vitals:   07/04/17 0750 07/04/17 0751  BP: (!) 161/53   Pulse: 69   Resp: 18   Temp:  36.7 C    Last Pain: There were no vitals filed for this visit.    Patients Stated Pain Goal: 5 (32/02/33 4356)  Complications: No apparent anesthesia complications

## 2017-07-04 NOTE — H&P (Signed)
Patient ID:   [id number] Patient: Christie [name]  Date of Birth: 10/18/1946 Visit Type: Office Visit   Date: 04/19/2017 10:15 AM Provider: Marchia Meiers. Vertell Limber MD   This 71 year old female presents for back pain.  History of Present Illness: 1.  back pain    MRI 04/06/2017 central disc protrusion L3-4 is moderately large and has progressed. There is severe spinal stenosis at L3-4 with subarticular stenosis bilaterally. Postop laminotomy on the left at L4-5 with enhancing scar tissue around the left L5 nerve root. Mild spinal stenosis.  Christie [name] reports lumbar pain and pain in both legs- worse on the right. Her right leg pain is most severe compared to left leg and lumbar pain.        MEDICATIONS(added, continued or stopped this visit): Started Medication Directions Instruction Stopped   alprazolam 0.5 mg tablet take 1 tablet by oral route 3 times every day     Baby Aspirin  ORAL take one tablet daily     Centrum Silver 0.4 mg-300 mcg-250 mcg tablet take one tablet daily     citalopram 20 mg tablet take 1 tablet by oral route  every day     colon health  ORAL take 1 tablet daily     glipizide 5 mg tablet take 1 tablet by oral route 2 times every day before meals    03/02/2016 Invokana 100 mg tablet take 1 tablet by oral route  every day before the first meal of the day     losartan 100 mg tablet take 1 tablet by oral route  every day     rosuvastatin 20 mg tablet take 1 tablet by oral route  every day    06/29/2016 tizanidine 2 mg tablet Take 1 tab up to TID prn spasm     vitamin d  ORAL take 1 tablet daily       ALLERGIES: Ingredient Reaction Medication Name Comment  OXYCODONE HCL Unknown PERCOCET   METFORMIN     ACETAMINOPHEN Unknown PERCOCET   ATORVASTATIN CALCIUM Unknown LIPITOR       Vitals Date Temp F BP Pulse Ht In Wt Lb BMI BSA Pain Score  04/19/2017  134/74 82 63.5 184 32.08  8/10      IMPRESSION MRI reveals reruptured disc at L3-4  x-rays reveal levo-convex  scoliosis and arthritis at L4-5 and L3-4. scoliosis x-rays confirm levo-convex scoliosis and spondylolisthesis at L4-5.   i believe scoliosis is underlying problem and causing worsening pressure on nerves at multiple levels.  i would recommend a right L2-3, L3-4 and L4-5 XLIF with percutaneous pedicle screws.     Completed Orders (this encounter) Order Details Reason Side Interpretation Result Initial Treatment Date Region  Scoliosis- AP/Lat      04/19/2017 All Levels to All Levels   Assessment/Plan # Detail Type Description   1. Assessment Scoliosis (and kyphoscoliosis), idiopathic (M41.20).       2. Assessment Radiculopathy, lumbar region (M54.16).       3. Assessment Spondylolisthesis, lumbar region (M43.16).       4. Assessment Lumbar foraminal stenosis (M99.83).   Plan Orders Aspen Lo Sag Rigid Panel Quick.         Pain Assessment/Treatment Location: back. Onset: 12/20/2015. Duration: varies. Quality: discomforting. Pain Assessment/Treatment follow-up plan of care: Patient is taking medications as prescribed..  nurse education given. scheduled right L2-3, L3-4 and L4-5 XLIF with percutaneous pedicle screws. fit for LSO brace.   Orders: Diagnostic Procedures: Assessment Procedure  M41.20 Scoliosis-  AP/Lat  M54.16 Lumbar Spine- AP/Lat  Miscellaneous: Assessment   M99.83 Aspen Lo Sag Rigid Panel Quick             Provider:  Marchia Meiers. Vertell Limber MD  04/19/2017 01:18 PM Dictation edited by: Christie [name]    CC Providers: Sallee Lange [facility] [place],  [place]  [id number]   [place] [address] [place], [place] [address]              Electronically signed by Marchia Meiers. Vertell Limber MD on 04/20/2017 05:26 PM

## 2017-07-04 NOTE — Progress Notes (Signed)
Part 1 Anterior lateral Part 2 Posterior lumbar

## 2017-07-04 NOTE — Anesthesia Postprocedure Evaluation (Signed)
Anesthesia Post Note  Patient: Christie [name]  Procedure(s) Performed: Procedure(s) (LRB): Right LUMBAR TWO-THREE LUMBAR THREE-FOUR LUMBAR FOUR-FIVE Anterior lateral lumbar interbody fusion with percutaneous pedicle screws (Right) PERCUTANEOUS SCREW THREE LEVEL LUMBAR TWO-THREE LUMBAR THREE-FOUR LUMBAR FOUR-FIVE (N/A)     Patient location during evaluation: PACU Anesthesia Type: General Level of consciousness: sedated, patient cooperative and oriented Pain management: pain level controlled Vital Signs Assessment: post-procedure vital signs reviewed and stable Respiratory status: spontaneous breathing, nonlabored ventilation, respiratory function stable and patient connected to nasal cannula oxygen Cardiovascular status: blood pressure returned to baseline and stable Postop Assessment: no signs of nausea or vomiting Anesthetic complications: no    Last Vitals:  Vitals:   07/04/17 1704 07/04/17 1726  BP: (!) 104/42 (!) 93/45  Pulse: 84 87  Resp: 13 18  Temp: 36.8 C 36.4 C    Last Pain:  Vitals:   07/04/17 1704  PainSc: Asleep                 Leotha Voeltz,E. Kryssa Risenhoover

## 2017-07-04 NOTE — Brief Op Note (Signed)
07/04/2017  3:24 PM  PATIENT:  Christie [name]  71 y.o. female  PRE-OPERATIVE DIAGNOSIS:  Lumbar scoliosis, herniated lumbar disc, Lumbar foraminal stenosis, lumbago, lumbar radiculopathy L 23, L 34, L 45 levels  POST-OPERATIVE DIAGNOSIS: Lumbar scoliosis, herniated lumbar disc, Lumbar foraminal stenosis, lumbago, lumbar radiculopathy L 23, L 34, L 45 levels  PROCEDURE:  Procedure(s) with comments: Right LUMBAR TWO-THREE LUMBAR THREE-FOUR LUMBAR FOUR-FIVE Anterior lateral lumbar interbody fusion with percutaneous pedicle screws (Right) - Right lateral approach  PERCUTANEOUS SCREW THREE LEVEL LUMBAR TWO-THREE LUMBAR THREE-FOUR LUMBAR FOUR-FIVE (N/A) - Prone  SURGEON:  Surgeon(s) and Role:    Erline Levine, MD - Primary    * Earnie Larsson, MD - Assisting  PHYSICIAN ASSISTANT:   ASSISTANTS: [place], RN   ANESTHESIA:   general  EBL:  Total I/O In: 2000 [I.V.:2000] Out: 7 [Urine:660; Blood:150]  BLOOD ADMINISTERED:none  DRAINS: none   LOCAL MEDICATIONS USED:  MARCAINE    and LIDOCAINE   SPECIMEN:  No Specimen  DISPOSITION OF SPECIMEN:  N/A  COUNTS:  YES  TOURNIQUET:  * No tourniquets in log *  DICTATION: Patient is a 71 year old with severe spondylosis stenosis and scoliosis of the lumbar spine. It was elected to take her to surgery for anterolateral decompression and posterior pedicle screw fixation.  Procedure: Patient was brought to the operating room and placed in a left lateral decubitus position on the operative table and using orthogonally projected C-arm fluoroscopy the patient was placed so that the L2-3 L3-4 and L4-5 levels were visualized in AP and lateral plane. The patient was then taped into position. The table was flexed so as to expose the L4-5 level as the patient has a high iliac crest. Skin was marked along with a posterior finger dissection incision. His flank was then prepped and draped in usual sterile fashion and incisions were made sequentially at L4-5 L3-4  and L2-3 levels. Posterior finger dissection was made to enter the retroperitoneal space and then subsequently the probe was inserted into the psoas muscle from the left side initially at the L4-5 level. After mapping the neural elements were able to dock the probe per the midpoint of this vertebral level and without indications electrically of too close proximity to the neural tissues. Subsequently the self-retaining tractor was.after sequential dilators were utilized the shim was employed and the interspace was cleared of psoas muscle and then incised. A thorough discectomy was performed. Instruments were used to clear the interspace of disc material. After thorough discectomy was performed and this was performed using AP and lateral fluoroscopy a 10 lordotic by 55 x 18 mm implant was packed withextra small BMP and Attrax. This was tamped into position using the slides and its position was confirmed on AP and lateral fluoroscopy.  After placing the graft, it appeared to have violated the ALL and was not rigidly fixated, so the graft was removed and retractor was carefully placed more posteriorly, while monitoring the lumbar plexus electrically. A 10 x 15 x 55 mm PEEK implant was placed with interfixated 50 x 5.5 mm screws, one in L 4 and one in L 5.  This appeared to be well positioned and rigidly fixated.   Subsequently exposure was performed at the L3-4 level and similar dissection was performed with locking of the self-retaining retractor. At this level were able to place a 10 lordotic by 22 x 50 mm implant packed in a similar fashion. At the L2-3 level were able to place a 10 lordotic  x 22 x 55 mm implant  mm standard by 55 x 22 mm implant packed in a similar fashion. Hemostasis was assured the wounds were irrigated and closed with interrupted Vicryl sutures.  Patient was then turned into a prone position on the [place] table and using AP and lateral fluoroscopy throughout this portion of the procedure,  pedicle screws were placed using Nuvasive Reline cannulated percutaneous screws. 2 screws were placed at L2 and (5.5 x 45 mm) and 2 at L3 (6.5 x 45) and two at L4 of a similar size and 2 at L5 of similar size. 95 mm rod was affixed to the screws on the left and 90 mm rod was affixed on the right. were then affixed to the screw heads do a separate stab incision and locked down on the screws. All connections were then torqued and the Towers were disassembled. Long-acting Marcaine was injected in the deep msculature.  The wounds were irrigated and then closed with 1, 2-0 and 3-0 Vicryl stitches. Sterile occlusive dressings were placed with Dermabond and occlusive dressings. The patient was then extubated in the operating room and taken to recovery in stable and satisfactory condition having tolerated his operation well. Counts were correct at the end of the case.   PLAN OF CARE: Admit to inpatient   PATIENT DISPOSITION:  PACU - hemodynamically stable.   Delay start of Pharmacological VTE agent (>24hrs) due to surgical blood loss or risk of bleeding: yes  Retractor times:L 45 (47:32) L 34 (25:24) L 23 (13:08)  Pelvic parameters:    PT 32  PI 58 LL 39 PI-LL +19 SVA 5 mm/66 mm

## 2017-07-04 NOTE — Op Note (Signed)
07/04/2017  3:24 PM  PATIENT:  Christie [name]  71 y.o. female  PRE-OPERATIVE DIAGNOSIS:  Lumbar scoliosis, herniated lumbar disc, Lumbar foraminal stenosis, lumbago, lumbar radiculopathy L 23, L 34, L 45 levels  POST-OPERATIVE DIAGNOSIS: Lumbar scoliosis, herniated lumbar disc, Lumbar foraminal stenosis, lumbago, lumbar radiculopathy L 23, L 34, L 45 levels  PROCEDURE:  Procedure(s) with comments: Right LUMBAR TWO-THREE LUMBAR THREE-FOUR LUMBAR FOUR-FIVE Anterior lateral lumbar interbody fusion with percutaneous pedicle screws (Right) - Right lateral approach  PERCUTANEOUS SCREW THREE LEVEL LUMBAR TWO-THREE LUMBAR THREE-FOUR LUMBAR FOUR-FIVE (N/A) - Prone  SURGEON:  Surgeon(s) and Role:    Erline Levine, MD - Primary    * Earnie Larsson, MD - Assisting  PHYSICIAN ASSISTANT:   ASSISTANTS: Poteat, RN   ANESTHESIA:   general  EBL:  Total I/O In: 2000 [I.V.:2000] Out: 52 [Urine:660; Blood:150]  BLOOD ADMINISTERED:none  DRAINS: none   LOCAL MEDICATIONS USED:  MARCAINE    and LIDOCAINE   SPECIMEN:  No Specimen  DISPOSITION OF SPECIMEN:  N/A  COUNTS:  YES  TOURNIQUET:  * No tourniquets in log *  DICTATION: Patient is a 71 year old with severe spondylosis stenosis and scoliosis of the lumbar spine. It was elected to take her to surgery for anterolateral decompression and posterior pedicle screw fixation.  Procedure: Patient was brought to the operating room and placed in a left lateral decubitus position on the operative table and using orthogonally projected C-arm fluoroscopy the patient was placed so that the L2-3 L3-4 and L4-5 levels were visualized in AP and lateral plane. The patient was then taped into position. The table was flexed so as to expose the L4-5 level as the patient has a high iliac crest. Skin was marked along with a posterior finger dissection incision. His flank was then prepped and draped in usual sterile fashion and incisions were made sequentially at L4-5 L3-4  and L2-3 levels. Posterior finger dissection was made to enter the retroperitoneal space and then subsequently the probe was inserted into the psoas muscle from the left side initially at the L4-5 level. After mapping the neural elements were able to dock the probe per the midpoint of this vertebral level and without indications electrically of too close proximity to the neural tissues. Subsequently the self-retaining tractor was.after sequential dilators were utilized the shim was employed and the interspace was cleared of psoas muscle and then incised. A thorough discectomy was performed. Instruments were used to clear the interspace of disc material. After thorough discectomy was performed and this was performed using AP and lateral fluoroscopy a 10 lordotic by 55 x 18 mm implant was packed withextra small BMP and Attrax. This was tamped into position using the slides and its position was confirmed on AP and lateral fluoroscopy.  After placing the graft, it appeared to have violated the ALL and was not rigidly fixated, so the graft was removed and retractor was carefully placed more posteriorly, while monitoring the lumbar plexus electrically. A 10 x 15 x 55 mm PEEK implant was placed with interfixated 50 x 5.5 mm screws, one in L 4 and one in L 5.  This appeared to be well positioned and rigidly fixated.   Subsequently exposure was performed at the L3-4 level and similar dissection was performed with locking of the self-retaining retractor. At this level were able to place a 10 lordotic by 22 x 50 mm implant packed in a similar fashion. At the L2-3 level were able to place a 10 lordotic  x 22 x 55 mm implant  mm standard by 55 x 22 mm implant packed in a similar fashion. Hemostasis was assured the wounds were irrigated and closed with interrupted Vicryl sutures.  Patient was then turned into a prone position on the [place] table and using AP and lateral fluoroscopy throughout this portion of the procedure,  pedicle screws were placed using Nuvasive Reline cannulated percutaneous screws. 2 screws were placed at L2 and (5.5 x 45 mm) and 2 at L3 (6.5 x 45) and two at L4 of a similar size and 2 at L5 of similar size. 95 mm rod was affixed to the screws on the left and 90 mm rod was affixed on the right. were then affixed to the screw heads do a separate stab incision and locked down on the screws. All connections were then torqued and the Towers were disassembled. Long-acting Marcaine was injected in the deep msculature.  The wounds were irrigated and then closed with 1, 2-0 and 3-0 Vicryl stitches. Sterile occlusive dressings were placed with Dermabond and occlusive dressings. The patient was then extubated in the operating room and taken to recovery in stable and satisfactory condition having tolerated his operation well. Counts were correct at the end of the case.   PLAN OF CARE: Admit to inpatient   PATIENT DISPOSITION:  PACU - hemodynamically stable.   Delay start of Pharmacological VTE agent (>24hrs) due to surgical blood loss or risk of bleeding: yes  Retractor times:L 45 (47:32) L 34 (25:24) L 23 (13:08)  Pelvic parameters:    PT 32  PI 58 LL 39 PI-LL +19 SVA 5 mm/66 mm

## 2017-07-05 ENCOUNTER — Encounter (HOSPITAL_COMMUNITY): Payer: Self-pay | Admitting: Neurosurgery

## 2017-07-05 LAB — GLUCOSE, CAPILLARY
GLUCOSE-CAPILLARY: 180 mg/dL — AB (ref 65–99)
GLUCOSE-CAPILLARY: 151 mg/dL — AB (ref 65–99)
GLUCOSE-CAPILLARY: 151 mg/dL — AB (ref 65–99)
Glucose-Capillary: 195 mg/dL — ABNORMAL HIGH (ref 65–99)

## 2017-07-05 MED ORDER — PROTAMINE SULFATE 10 MG/ML IV SOLN
INTRAVENOUS | Status: AC
  Filled 2017-07-05: qty 25

## 2017-07-05 MED ORDER — TRAMADOL HCL 50 MG PO TABS: 50.0000 mg | ORAL_TABLET | Freq: Four times a day (QID) | ORAL | Status: DC | PRN

## 2017-07-05 MED ORDER — PHENYLEPHRINE 40 MCG/ML (10ML) SYRINGE FOR IV PUSH (FOR BLOOD PRESSURE SUPPORT)
PREFILLED_SYRINGE | INTRAVENOUS | Status: AC
  Filled 2017-07-05: qty 10

## 2017-07-05 MED ORDER — HYDROCODONE-ACETAMINOPHEN 7.5-325 MG PO TABS
1.0000 | ORAL_TABLET | ORAL | Status: DC | PRN
  Administered 2017-07-05 – 2017-07-06 (×4): 2 via ORAL
  Filled 2017-07-05 (×4): qty 2

## 2017-07-05 MED ORDER — PROTAMINE SULFATE 10 MG/ML IV SOLN
INTRAVENOUS | Status: AC
  Filled 2017-07-05: qty 5

## 2017-07-05 MED ORDER — ARTIFICIAL TEARS OPHTHALMIC OINT
TOPICAL_OINTMENT | OPHTHALMIC | Status: AC
  Filled 2017-07-05: qty 10.5

## 2017-07-05 MED ORDER — PHENYLEPHRINE HCL 10 MG/ML IJ SOLN
INTRAMUSCULAR | Status: AC
  Filled 2017-07-05: qty 1

## 2017-07-05 MED ORDER — ETOMIDATE 2 MG/ML IV SOLN
INTRAVENOUS | Status: AC
  Filled 2017-07-05: qty 10

## 2017-07-05 MED FILL — Sodium Bicarbonate IV Soln 8.4%: INTRAVENOUS | Qty: 50 | Status: AC

## 2017-07-05 MED FILL — Mannitol IV Soln 20%: INTRAVENOUS | Qty: 500 | Status: AC

## 2017-07-05 MED FILL — Heparin Sodium (Porcine) Inj 1000 Unit/ML: INTRAMUSCULAR | Qty: 10 | Status: AC

## 2017-07-05 MED FILL — Lidocaine HCl IV Inj 20 MG/ML: INTRAVENOUS | Qty: 5 | Status: AC

## 2017-07-05 MED FILL — Sodium Chloride IV Soln 0.9%: INTRAVENOUS | Qty: 2000 | Status: AC

## 2017-07-05 MED FILL — Heparin Sodium (Porcine) Inj 1000 Unit/ML: INTRAMUSCULAR | Qty: 30 | Status: AC

## 2017-07-05 MED FILL — Electrolyte-R (PH 7.4) Solution: INTRAVENOUS | Qty: 3000 | Status: AC

## 2017-07-05 MED FILL — Sodium Chloride IV Soln 0.9%: INTRAVENOUS | Qty: 1000 | Status: AC

## 2017-07-05 NOTE — Evaluation (Signed)
Occupational Therapy Evaluation Patient Details Name: Christie [name] MRN: [id number] DOB: [birth year] Today's Date: 07/05/2017    History of Present Illness Pt is a 71 y/o female who presents s/p L2-L5 anteriolateral fusion on 07/04/17. PMH significant for DM.    Clinical Impression   Patient evaluated by Occupational Therapy with no further acute OT needs identified. All education has been completed and the patient has no further questions. See below for any follow-up Occupational Therapy or equipment needs. OT to sign off. Thank you for referral.   BP Sitting 142/69 Supine 125/61 Symptoms of headache and dizziness    Follow Up Recommendations  No OT follow up    Equipment Recommendations  3 in 1 bedside commode;Other (comment) (rw)    Recommendations for Other Services       Precautions / Restrictions Precautions Precautions: Fall;Back Precaution Booklet Issued: Yes (comment) Precaution Comments: able to recall precautions 100% Required Braces or Orthoses: Spinal Brace Spinal Brace: Lumbar corset;Applied in sitting position Restrictions Weight Bearing Restrictions: No      Mobility Bed Mobility Overal bed mobility: Needs Assistance Bed Mobility: Rolling;Sidelying to Sit;Sit to Supine Rolling: Modified independent (Device/Increase time) Sidelying to sit: Min assist   Sit to supine: Mod assist   General bed mobility comments: needs cues for sequence and elevate trunk  Transfers Overall transfer level: Needs assistance Equipment used: Rolling walker (2 wheeled) Transfers: Sit to/from Stand Sit to Stand: Min guard         General transfer comment: Close guard for safety as pt powered-up to full standing position. Pt reports dizziness upon initial stand which lessens with time.     Balance Overall balance assessment: Needs assistance Sitting-balance support: Feet supported;No upper extremity supported Sitting balance-Leahy Scale: Fair     Standing balance  support: Bilateral upper extremity supported Standing balance-Leahy Scale: Poor                             ADL either performed or assessed with clinical judgement   ADL Overall ADL's : Needs assistance/impaired Eating/Feeding: Set up Eating/Feeding Details (indicate cue type and reason): daughter (A) with drink and cracker on arrival due to nausea         Lower Body Bathing: Moderate assistance   Upper Body Dressing : Minimal assistance Upper Body Dressing Details (indicate cue type and reason): daughter helping place brace and mod cues for sequence. pt closing eyes and unable to sustain visual attention to task Lower Body Dressing: Moderate assistance   Toilet Transfer: Minimal assistance   Toileting- Clothing Manipulation and Hygiene: Moderate assistance       Functional mobility during ADLs: Minimal assistance General ADL Comments: pt with eyes closed. reports dizziness and pain. pt with slight headache. pt noted to have decr SBP this session suggestion of orthostatic BP. RN notified     Vision Baseline Vision/History: Wears glasses Wears Glasses: At all times       Perception     Praxis      Pertinent Vitals/Pain Pain Assessment: Faces Faces Pain Scale: Hurts even more Pain Location: Incision site Pain Descriptors / Indicators: Operative site guarding Pain Intervention(s): Monitored during session;Premedicated before session;Repositioned;Limited activity within patient's tolerance;Other (comment) (nauseated)     Hand Dominance Right   Extremity/Trunk Assessment Upper Extremity Assessment Upper Extremity Assessment: Overall WFL for tasks assessed   Lower Extremity Assessment Lower Extremity Assessment: Defer to PT evaluation   Cervical / Trunk Assessment Cervical /  Trunk Assessment: Other exceptions Cervical / Trunk Exceptions: Hx scoliosis. Now s/p surgery   Communication Communication Communication: No difficulties   Cognition  Arousal/Alertness: Awake/alert Behavior During Therapy: WFL for tasks assessed/performed Overall Cognitive Status: Within Functional Limits for tasks assessed                                     General Comments  drying dry intact with honey comb dressing in place    Exercises     Shoulder Instructions      Home Living Family/patient expects to be discharged to:: Private residence Living Arrangements: Children Available Help at Discharge: Family;Available 24 hours/day Type of Home: House Home Access: Stairs to enter [employer] of Steps: 1 Entrance Stairs-Rails: Right Home Layout: One level     Bathroom Shower/Tub: Tub/shower unit;Walk-in shower   Bathroom Toilet: Standard Bathroom Accessibility: Yes   Home Equipment: Walker - 2 wheels;Wheelchair - manual          Prior Functioning/Environment Level of Independence: Independent with assistive device(s);Independent                 OT Problem List:        OT Treatment/Interventions:      OT Goals(Current goals can be found in the care plan section) Acute Rehab OT Goals Patient Stated Goal: Return home, return to PLOF  OT Frequency:     Barriers to D/C:            Co-evaluation              AM-PAC PT "6 Clicks" Daily Activity     Outcome Measure Help from another person eating meals?: None Help from another person taking care of personal grooming?: A Little Help from another person toileting, which includes using toliet, bedpan, or urinal?: A Little Help from another person bathing (including washing, rinsing, drying)?: A Little Help from another person to put on and taking off regular upper body clothing?: A Little Help from another person to put on and taking off regular lower body clothing?: A Lot 6 Click Score: 18   End of Session Equipment Utilized During Treatment: Gait belt;Back brace Nurse Communication: Mobility status;Precautions  Activity Tolerance:  Patient tolerated treatment well Patient left: with call bell/phone within reach;in bed;with family/visitor present  OT Visit Diagnosis: Unsteadiness on feet (R26.81)                Time: 2707-8675 OT Time Calculation (min): 18 min Charges:  OT General Charges $OT Visit: 1 Procedure OT Evaluation $OT Eval Moderate Complexity: 1 Procedure G-Codes:      Jeri Modena   OTR/L Pager: [phone] Office: [phone] .   Parke Poisson B 07/05/2017, 11:01 AM

## 2017-07-05 NOTE — Progress Notes (Signed)
Subjective: Patient reports doing well  Objective: Vital signs in last 24 hours: Temp:  [97.6 F (36.4 C)-98.3 F (36.8 C)] 98.3 F (36.8 C) (07/18 0729) Pulse Rate:  [72-96] 86 (07/18 0729) Resp:  [11-20] 18 (07/18 0729) BP: (93-131)/(40-65) 115/49 (07/18 0729) SpO2:  [93 %-100 %] 93 % (07/18 0729)  Intake/Output from previous day: 07/17 0730 - 07/18 0729 In: 2240 [P.O.:240; I.V.:2000] Out: 2185 [Urine:2035; Blood:150] Intake/Output this shift: Total I/O In: -  Out: 100 [Urine:100]  Physical Exam: Minimal right HF weakness.  Dressings CDI.  Up in brace.  Has been walking.    Lab Results: No results for input(s): WBC, HGB, HCT, PLT in the last 72 hours. BMET No results for input(s): NA, K, CL, CO2, GLUCOSE, BUN, CREATININE, CALCIUM in the last 72 hours.  Studies/Results: Dg Lumbar Spine 2-3 Views  Result Date: 07/04/2017 CLINICAL DATA:  71 year old female post fusion L2 through L5. Subsequent encounter. EXAM: DG C-ARM GT 120 MIN; LUMBAR SPINE - 2-3 VIEW COMPARISON:  04/06/2017 MR. FINDINGS: Three intraoperative C-arm views submitted for review after surgery. Post fusion with bilateral pedicle screws, posterior connecting bar and interbody spacer L2-3, L3-4 and L4-5. No obvious complication. Pedicle screw placement can be assessed on follow-up. L4-5 interbody spacer placed from right lateral position. IMPRESSION: Fusion L2-L5. Electronically Signed   By: Genia Del M.D.   On: 07/04/2017 15:43   Dg C-arm Gt 120 Min  Result Date: 07/04/2017 CLINICAL DATA:  71 year old female post fusion L2 through L5. Subsequent encounter. EXAM: DG C-ARM GT 120 MIN; LUMBAR SPINE - 2-3 VIEW COMPARISON:  04/06/2017 MR. FINDINGS: Three intraoperative C-arm views submitted for review after surgery. Post fusion with bilateral pedicle screws, posterior connecting bar and interbody spacer L2-3, L3-4 and L4-5. No obvious complication. Pedicle screw placement can be assessed on follow-up. L4-5 interbody  spacer placed from right lateral position. IMPRESSION: Fusion L2-L5. Electronically Signed   By: Genia Del M.D.   On: 07/04/2017 15:43    Assessment/Plan: Doing well POD 1.  Continue to mobilize with PT.    LOS: 1 day    Peggyann Shoals, MD 07/05/2017, 8:38 AM

## 2017-07-05 NOTE — Evaluation (Signed)
Physical Therapy Evaluation Patient Details Name: Christie [name] MRN: [id number] DOB: January 11, 1946 Today's Date: 07/05/2017   History of Present Illness  Pt is a 71 y/o female who presents s/p L2-L5 anteriolateral fusion on 07/04/17. PMH significant for DM.   Clinical Impression  Pt admitted with above diagnosis. Pt currently with functional limitations due to the deficits listed below (see PT Problem List). At the time of PT eval pt was able to perform transfers and ambulation with min guard to min assist for balance support and safety. Pt will have 24 hour assist from daughter upon return home. Pt will benefit from skilled PT to increase their independence and safety with mobility to allow discharge to the venue listed below.       Follow Up Recommendations No PT follow up;Supervision for mobility/OOB    Equipment Recommendations  3in1 (PT)    Recommendations for Other Services       Precautions / Restrictions Precautions Precautions: Fall;Back Precaution Booklet Issued: Yes (comment) Precaution Comments: Precaution sheet reviewed in detail. Pt was cued for precautions during functional mobility.  Required Braces or Orthoses: Spinal Brace Spinal Brace: Lumbar corset;Applied in sitting position Restrictions Weight Bearing Restrictions: No      Mobility  Bed Mobility Overal bed mobility: Needs Assistance Bed Mobility: Rolling;Sidelying to Sit Rolling: Modified independent (Device/Increase time) Sidelying to sit: Min guard       General bed mobility comments: VC's for sequencing and technique for log roll. Rails lowered and HOB flat to simulate home environment.   Transfers Overall transfer level: Needs assistance Equipment used: Rolling walker (2 wheeled) Transfers: Sit to/from Stand Sit to Stand: Min guard         General transfer comment: Close guard for safety as pt powered-up to full standing position. Pt reports dizziness upon initial stand which lessens with time.    Ambulation/Gait Ambulation/Gait assistance: Min assist Ambulation Distance (Feet): 150 Feet Assistive device: Rolling walker (2 wheeled) Gait Pattern/deviations: Step-through pattern;Decreased stride length;Drifts right/left;Trunk flexed Gait velocity: Decreased Gait velocity interpretation: Below normal speed for age/gender General Gait Details: VC's for improved posture and maintenance of precautions. Pt drifting L in hall and required cues to avoid hitting the wall with the walker. Noted mild unsteadiness requiring min assist to recover throughout gait training.   Stairs            Wheelchair Mobility    Modified Rankin (Stroke Patients Only)       Balance Overall balance assessment: Needs assistance Sitting-balance support: Feet supported;No upper extremity supported Sitting balance-Leahy Scale: Fair     Standing balance support: Bilateral upper extremity supported Standing balance-Leahy Scale: Poor                               Pertinent Vitals/Pain Pain Assessment: Faces Faces Pain Scale: Hurts even more Pain Location: Incision site Pain Descriptors / Indicators: Operative site guarding;Discomfort Pain Intervention(s): Limited activity within patient's tolerance;Monitored during session;Repositioned    Home Living Family/patient expects to be discharged to:: Private residence Living Arrangements: Children Available Help at Discharge: Family;Available 24 hours/day Type of Home: House Home Access: Stairs to enter Entrance Stairs-Rails: Right Entrance Stairs-Number of Steps: 1 Home Layout: One level Home Equipment: Walker - 2 wheels;Wheelchair - manual      Prior Function Level of Independence: Independent with assistive device(s);Independent               Hand Dominance  Extremity/Trunk Assessment   Upper Extremity Assessment Upper Extremity Assessment: Defer to OT evaluation    Lower Extremity Assessment Lower  Extremity Assessment: Generalized weakness (consistent with pre-op diagnosis)    Cervical / Trunk Assessment Cervical / Trunk Assessment: Other exceptions Cervical / Trunk Exceptions: Hx scoliosis. Now s/p surgery  Communication   Communication: No difficulties  Cognition Arousal/Alertness: Awake/alert Behavior During Therapy: WFL for tasks assessed/performed Overall Cognitive Status: Within Functional Limits for tasks assessed                                        General Comments      Exercises     Assessment/Plan    PT Assessment Patient needs continued PT services  PT Problem List Decreased strength;Decreased range of motion;Decreased activity tolerance;Decreased balance;Decreased mobility;Decreased knowledge of use of DME;Decreased safety awareness;Decreased knowledge of precautions;Pain       PT Treatment Interventions DME instruction;Gait training;Stair training;Functional mobility training;Therapeutic activities;Therapeutic exercise;Neuromuscular re-education;Patient/family education    PT Goals (Current goals can be found in the Care Plan section)  Acute Rehab PT Goals Patient Stated Goal: Return home, return to PLOF PT Goal Formulation: With patient/family Time For Goal Achievement: 07/12/17 Potential to Achieve Goals: Good    Frequency Min 5X/week   Barriers to discharge        Co-evaluation               AM-PAC PT "6 Clicks" Daily Activity  Outcome Measure Difficulty turning over in bed (including adjusting bedclothes, sheets and blankets)?: None Difficulty moving from lying on back to sitting on the side of the bed? : A Little Difficulty sitting down on and standing up from a chair with arms (e.g., wheelchair, bedside commode, etc,.)?: A Little Help needed moving to and from a bed to chair (including a wheelchair)?: A Little Help needed walking in hospital room?: A Little Help needed climbing 3-5 steps with a railing? : A  Little 6 Click Score: 19    End of Session Equipment Utilized During Treatment: Back brace Activity Tolerance: Patient limited by pain;Patient limited by fatigue Patient left: in chair;with call bell/phone within reach;with family/visitor present Nurse Communication: Mobility status PT Visit Diagnosis: Unsteadiness on feet (R26.81);Pain;Other symptoms and signs involving the nervous system (R29.898) Pain - part of body:  (back)    Time: 4098-1191 PT Time Calculation (min) (ACUTE ONLY): 21 min   Charges:   PT Evaluation $PT Eval Moderate Complexity: 1 Procedure     PT G Codes:        Rolinda Roan, PT, DPT Acute Rehabilitation Services Pager: [phone]   Thelma Comp 07/05/2017, 8:48 AM

## 2017-07-06 LAB — GLUCOSE, CAPILLARY: Glucose-Capillary: 164 mg/dL — ABNORMAL HIGH (ref 65–99)

## 2017-07-06 NOTE — Discharge Instructions (Signed)

## 2017-07-06 NOTE — Progress Notes (Signed)
Pt. Discharged home per order. Discharged instructions given to patient and family, with stated understanding of instructions given. Script called in to the pharmacy by MD and patient has an appointment with MD in 3 weekls.

## 2017-07-06 NOTE — Progress Notes (Signed)
Physical Therapy Treatment Patient Details Name: Christie [name] MRN: [id number] DOB: [birth year] Today's Date: 07/06/2017    History of Present Illness Pt is a 71 y/o female who presents s/p L2-L5 anteriolateral fusion on 07/04/17. PMH significant for DM.     PT Comments    Pt progressing towards physical therapy goals. Was able to tolerate stair training this session with good demonstration of safety and technique. Daughter present today for education. Will continue to follow and progress as able per POC.    Follow Up Recommendations  No PT follow up;Supervision for mobility/OOB     Equipment Recommendations  3in1 (PT)    Recommendations for Other Services       Precautions / Restrictions Precautions Precautions: Fall;Back Precaution Booklet Issued: Yes (comment) Precaution Comments: able to recall precautions 100% Required Braces or Orthoses: Spinal Brace Spinal Brace: Lumbar corset;Applied in sitting position Restrictions Weight Bearing Restrictions: No    Mobility  Bed Mobility               General bed mobility comments: Pt received sitting up in recliner chair   Transfers Overall transfer level: Needs assistance Equipment used: Rolling walker (2 wheeled) Transfers: Sit to/from Stand Sit to Stand: Min guard         General transfer comment: Close guard for safety as pt powered-up to full standing position.   Ambulation/Gait Ambulation/Gait assistance: Min guard Ambulation Distance (Feet): 250 Feet Assistive device: Rolling walker (2 wheeled) Gait Pattern/deviations: Step-through pattern;Decreased stride length;Drifts right/left;Trunk flexed Gait velocity: Decreased Gait velocity interpretation: Below normal speed for age/gender General Gait Details: VC's for improved posture and maintenance of precautions. Pt drifting L in hall and required cues to avoid hitting the wall with the walker. Noted mild unsteadiness requiring min assist to recover throughout  gait training.    Stairs Stairs: Yes   Stair Management: One rail Right;Step to pattern;Forwards Number of Stairs: 10 General stair comments: VC's for sequencing and general safety with stair negotiation.   Wheelchair Mobility    Modified Rankin (Stroke Patients Only)       Balance Overall balance assessment: Needs assistance Sitting-balance support: Feet supported;No upper extremity supported Sitting balance-Leahy Scale: Fair     Standing balance support: Bilateral upper extremity supported Standing balance-Leahy Scale: Poor                              Cognition Arousal/Alertness: Awake/alert Behavior During Therapy: WFL for tasks assessed/performed Overall Cognitive Status: Within Functional Limits for tasks assessed                                        Exercises      General Comments        Pertinent Vitals/Pain Pain Assessment: Faces Faces Pain Scale: Hurts even more Pain Location: Incision site Pain Descriptors / Indicators: Operative site guarding Pain Intervention(s): Monitored during session    Home Living                      Prior Function            PT Goals (current goals can now be found in the care plan section) Acute Rehab PT Goals Patient Stated Goal: Return home, return to PLOF PT Goal Formulation: With patient/family Time For Goal Achievement: 07/12/17 Potential to Achieve Goals: Good Progress  towards PT goals: Progressing toward goals    Frequency    Min 5X/week      PT Plan Current plan remains appropriate    Co-evaluation              AM-PAC PT "6 Clicks" Daily Activity  Outcome Measure  Difficulty turning over in bed (including adjusting bedclothes, sheets and blankets)?: None Difficulty moving from lying on back to sitting on the side of the bed? : A Little Difficulty sitting down on and standing up from a chair with arms (e.g., wheelchair, bedside commode, etc,.)?: A  Little Help needed moving to and from a bed to chair (including a wheelchair)?: A Little Help needed walking in hospital room?: A Little Help needed climbing 3-5 steps with a railing? : A Little 6 Click Score: 19    End of Session Equipment Utilized During Treatment: Back brace Activity Tolerance: Patient limited by pain;Patient limited by fatigue Patient left: in chair;with call bell/phone within reach;with family/visitor present Nurse Communication: Mobility status PT Visit Diagnosis: Unsteadiness on feet (R26.81);Pain;Other symptoms and signs involving the nervous system (R29.898) Pain - part of body:  (back)     Time: 4920-1007 PT Time Calculation (min) (ACUTE ONLY): 17 min  Charges:  $Gait Training: 8-22 mins                    G Codes:       Rolinda Roan, PT, DPT Acute Rehabilitation Services Pager: [place] 07/06/2017, 10:51 AM

## 2017-07-06 NOTE — Discharge Summary (Signed)
Physician Discharge Summary  Patient ID: Christie [name] MRN: [id number] DOB/AGE: 12/31/1945 71 y.o.  Admit date: 07/04/2017 Discharge date: 07/06/2017  Admission Diagnoses: Lumbar scoliosis, herniated lumbar disc, Lumbar foraminal stenosis, lumbago, lumbar radiculopathy L 23, L 34, L 45 levels     Discharge Diagnoses: Lumbar scoliosis, herniated lumbar disc, Lumbar foraminal stenosis, lumbago, lumbar radiculopathy L 23, L 34, L 45 levels s/p Right LUMBAR TWO-THREE LUMBAR THREE-FOUR LUMBAR FOUR-FIVE Anterior lateral lumbar interbody fusion with percutaneous pedicle screws (Right) - Right lateral approach  PERCUTANEOUS SCREW THREE LEVEL LUMBAR TWO-THREE LUMBAR THREE-FOUR LUMBAR FOUR-FIVE (N/A) - Prone    Active Problems:   Lumbar spine scoliosis   Discharged Condition: good  Hospital Course: Christie [name] was admitted for surgery with dx scoliosis, HNP, and radiculopathy.  Following uncomplicated XLIF with percutaneous pedicle screws, she recovered nicely and transferred to [facility] for nursing care and therapies. She has mobilized well.   Consults: None  Significant Diagnostic Studies: radiology: X-Ray: intra-op  Treatments: surgery: Right LUMBAR TWO-THREE LUMBAR THREE-FOUR LUMBAR FOUR-FIVE Anterior lateral lumbar interbody fusion with percutaneous pedicle screws (Right) - Right lateral approach  PERCUTANEOUS SCREW THREE LEVEL LUMBAR TWO-THREE LUMBAR THREE-FOUR LUMBAR FOUR-FIVE (N/A) - Prone     Discharge Exam: Blood pressure (!) 147/55, pulse 90, temperature 98.2 F (36.8 C), temperature source Oral, resp. rate 20, height 5' 3.5" (1.613 m), weight 83.9 kg (185 lb), SpO2 97 %.  Alert, conversant, smiling.  Up in chair talking with daughter. Good strength BLE. Pain well-controlled with po Norco and Robaxin. Incisions lumbar and right side without erythema, swelling, or drainage. Mobilizing well.      Disposition: 01-Home or Self Care Rx's for Norco 5/325 1-2 po q4-6hrs prn pain & Robaxin 500mg   po up to TID prn spasm will be sent to pts pharmacy from office. Pt verbalizes understanding of d/c instructions. She has f/u appt to see Dr. Vertell Limber.        Allergies as of 07/06/2017      Reactions   Lipitor [atorvastatin] Other (See Comments)   ELEVATED LFT's   Ace Inhibitors    UNSPECIFIED REACTION    Metformin And Related    REACTION IS SIDE EFFECT ,  UNSPECIFIED REACTION       Medication List    TAKE these medications   acetaminophen 500 MG tablet Commonly known as:  TYLENOL Take 500 mg by mouth every 6 (six) hours as needed.   ALPRAZolam 0.5 MG tablet Commonly known as:  XANAX TAKE 1 TABLET BY MOUTH AT BEDTIME FOR SLEEP.   aspirin 81 MG tablet Take 81 mg by mouth daily.   MACULAR VITAMIN BENEFIT PO Take 1 tablet by mouth daily.   CENTRUM SILVER PO Take 1 tablet by mouth daily.   cholecalciferol 1000 units tablet Commonly known as:  VITAMIN D Take 1,000 Units by mouth daily.   citalopram 20 MG tablet Commonly known as:  CELEXA TAKE ONE TABLET BY MOUTH ONCE DAILY. What changed:  See the new instructions.   glipiZIDE 5 MG tablet Commonly known as:  GLUCOTROL TAKE (2) TABLETS BY MOUTH IN THE MORNING AND TWO AND A HALF TABLETS IN THE EVENING   HEALTHY COLON PO Take 1 capsule by mouth every morning. One qam   indapamide 2.5 MG tablet Commonly known as:  LOZOL Take 1 tablet (2.5 mg total) by mouth daily.   JARDIANCE 10 MG Tabs tablet Generic drug:  empagliflozin TAKE ONE TABLET BY MOUTH ONCE DAILY.   losartan 100 MG tablet  Commonly known as:  COZAAR Take 1 tablet (100 mg total) by mouth daily.   rosuvastatin 20 MG tablet Commonly known as:  CRESTOR Take 1 tablet (20 mg total) by mouth daily.   sodium fluoride-calcium carbonate 8.3-364 MG Caps capsule Commonly known as:  FLORICAL Take 1 capsule by mouth 2 (two) times daily.            Durable Medical Equipment        Start     Ordered   07/05/17 1016  For home use only DME 3 n 1  Once      07/05/17 1016   07/05/17 0938  For home use only DME 3 n 1  Once     07/05/17 6433       Signed: Peggyann Shoals, MD 07/06/2017, 7:31 AM

## 2017-07-06 NOTE — Progress Notes (Addendum)
Subjective: Patient reports "I'm doing ok"  Objective: Vital signs in last 24 hours: Temp:  [98.1 F (36.7 C)-100 F (37.8 C)] 98.2 F (36.8 C) (07/19 0407) Pulse Rate:  [82-91] 90 (07/19 0407) Resp:  [18-20] 20 (07/19 0407) BP: (115-147)/(41-69) 147/55 (07/19 0407) SpO2:  [93 %-99 %] 97 % (07/19 0407)  Intake/Output from previous day: 07/18 0701 - 07/19 0700 In: 480 [P.O.:480] Out: 250 [Urine:250] Intake/Output this shift: No intake/output data recorded.  Alert, conversant, smiling.  Up in chair talking with daughter. Good strength BLE. Pain well-controlled with po Norco and Robaxin. Incisions lumbar and right side without erythema, swelling, or drainage. Mobilizing well.  Lab Results: No results for input(s): WBC, HGB, HCT, PLT in the last 72 hours. BMET No results for input(s): NA, K, CL, CO2, GLUCOSE, BUN, CREATININE, CALCIUM in the last 72 hours.  Studies/Results: Dg Lumbar Spine 2-3 Views  Result Date: 07/04/2017 CLINICAL DATA:  71 year old female post fusion L2 through L5. Subsequent encounter. EXAM: DG C-ARM GT 120 MIN; LUMBAR SPINE - 2-3 VIEW COMPARISON:  04/06/2017 MR. FINDINGS: Three intraoperative C-arm views submitted for review after surgery. Post fusion with bilateral pedicle screws, posterior connecting bar and interbody spacer L2-3, L3-4 and L4-5. No obvious complication. Pedicle screw placement can be assessed on follow-up. L4-5 interbody spacer placed from right lateral position. IMPRESSION: Fusion L2-L5. Electronically Signed   By: Genia Del M.D.   On: 07/04/2017 15:43   Dg C-arm Gt 120 Min  Result Date: 07/04/2017 CLINICAL DATA:  71 year old female post fusion L2 through L5. Subsequent encounter. EXAM: DG C-ARM GT 120 MIN; LUMBAR SPINE - 2-3 VIEW COMPARISON:  04/06/2017 MR. FINDINGS: Three intraoperative C-arm views submitted for review after surgery. Post fusion with bilateral pedicle screws, posterior connecting bar and interbody spacer L2-3, L3-4 and  L4-5. No obvious complication. Pedicle screw placement can be assessed on follow-up. L4-5 interbody spacer placed from right lateral position. IMPRESSION: Fusion L2-L5. Electronically Signed   By: Genia Del M.D.   On: 07/04/2017 15:43    Assessment/Plan: Improving  LOS: 2 days  Per DrStern, d/c to home. Rx's for Norco 5/325 1-2 po q4-6hrs prn pain & Robaxin 500mg  po up to TID prn spasm will be sent to pts pharmacy from office. Pt verbalizes understanding of d/c instructions. She has f/u appt to see Dr. Vertell Limber.     Verdis Prime 07/06/2017, 7:06 AM   Patient is doing well.  Discharge home.

## 2017-07-24 ENCOUNTER — Other Ambulatory Visit: Payer: Self-pay | Admitting: Family Medicine

## 2017-07-24 NOTE — Telephone Encounter (Signed)
6 refills °

## 2017-08-01 ENCOUNTER — Telehealth: Payer: Self-pay | Admitting: Family Medicine

## 2017-08-01 NOTE — Telephone Encounter (Signed)
Pt dropped off glucose readings. Please see in red folder in office. 

## 2017-08-02 NOTE — Telephone Encounter (Signed)
Please let the patient know that her glucose readings were reviewed by myself. The most recent readings look much better. Do the best she can at eating healthy. Please follow-up in September we need to check an A1c at that time

## 2017-08-03 NOTE — Telephone Encounter (Signed)
Patient is aware 

## 2017-08-04 MED FILL — Sodium Bicarbonate IV Soln 8.4%: INTRAVENOUS | Qty: 50 | Status: AC

## 2017-08-04 MED FILL — Lidocaine HCl IV Inj 20 MG/ML: INTRAVENOUS | Qty: 5 | Status: AC

## 2017-08-04 MED FILL — Sodium Chloride IV Soln 0.9%: INTRAVENOUS | Qty: 2000 | Status: AC

## 2017-08-04 MED FILL — Electrolyte-R (PH 7.4) Solution: INTRAVENOUS | Qty: 3000 | Status: AC

## 2017-08-04 MED FILL — Heparin Sodium (Porcine) Inj 1000 Unit/ML: INTRAMUSCULAR | Qty: 30 | Status: AC

## 2017-08-04 MED FILL — Mannitol IV Soln 20%: INTRAVENOUS | Qty: 500 | Status: AC

## 2017-08-04 MED FILL — Heparin Sodium (Porcine) Inj 1000 Unit/ML: INTRAMUSCULAR | Qty: 10 | Status: AC

## 2017-08-04 MED FILL — Sodium Chloride IV Soln 0.9%: INTRAVENOUS | Qty: 1000 | Status: AC

## 2017-08-28 ENCOUNTER — Telehealth: Payer: Self-pay | Admitting: Family Medicine

## 2017-08-28 DIAGNOSIS — Z79899 Other long term (current) drug therapy: Secondary | ICD-10-CM

## 2017-08-28 DIAGNOSIS — E119 Type 2 diabetes mellitus without complications: Secondary | ICD-10-CM

## 2017-08-28 DIAGNOSIS — Z13220 Encounter for screening for lipoid disorders: Secondary | ICD-10-CM

## 2017-08-28 NOTE — Telephone Encounter (Signed)
Patient said she is supposed to have labs done this month and is requesting this to be ordered.

## 2017-08-29 NOTE — Telephone Encounter (Signed)
Metabolic 7, lipid, liver,hoglobin A1c,urine ACR

## 2017-08-29 NOTE — Telephone Encounter (Signed)
Patient informed and I have sent the orders over.

## 2017-09-06 LAB — LIPID PANEL
CHOLESTEROL TOTAL: 168 mg/dL (ref 100–199)
Chol/HDL Ratio: 2.7 ratio (ref 0.0–4.4)
HDL: 63 mg/dL (ref >39)
LDL Calculated: 73 mg/dL (ref 0–99)
Triglycerides: 162 mg/dL — ABNORMAL HIGH (ref 0–149)
VLDL Cholesterol Cal: 32 mg/dL (ref 5–40)

## 2017-09-06 LAB — HEMOGLOBIN A1C
Est. average glucose Bld gHb Est-mCnc: 160 mg/dL
Hgb A1c MFr Bld: 7.2 % — ABNORMAL HIGH (ref 4.8–5.6)

## 2017-09-06 LAB — BASIC METABOLIC PANEL
BUN / CREAT RATIO: 22 (ref 12–28)
BUN: 14 mg/dL (ref 8–27)
CALCIUM: 10.1 mg/dL (ref 8.7–10.3)
CHLORIDE: 98 mmol/L (ref 96–106)
CO2: 29 mmol/L (ref 20–29)
Creatinine, Ser: 0.64 mg/dL (ref 0.57–1.00)
GFR calc non Af Amer: 89 mL/min/{1.73_m2} (ref >59)
GFR, EST AFRICAN AMERICAN: 102 mL/min/{1.73_m2} (ref >59)
GLUCOSE: 142 mg/dL — AB (ref 65–99)
POTASSIUM: 3.7 mmol/L (ref 3.5–5.2)
Sodium: 141 mmol/L (ref 134–144)

## 2017-09-06 LAB — HEPATIC FUNCTION PANEL
ALK PHOS: 123 IU/L — AB (ref 39–117)
ALT: 19 IU/L (ref 0–32)
AST: 22 IU/L (ref 0–40)
Albumin: 4.5 g/dL (ref 3.5–4.8)
BILIRUBIN, DIRECT: 0.2 mg/dL (ref 0.00–0.40)
Bilirubin Total: 0.6 mg/dL (ref 0.0–1.2)
TOTAL PROTEIN: 6.8 g/dL (ref 6.0–8.5)

## 2017-09-06 LAB — MICROALBUMIN / CREATININE URINE RATIO
Creatinine, Urine: 95 mg/dL
MICROALB/CREAT RATIO: 6.6 mg/g{creat} (ref 0.0–30.0)
Microalbumin, Urine: 6.3 ug/mL

## 2017-09-12 ENCOUNTER — Encounter: Payer: Self-pay | Admitting: Family Medicine

## 2017-09-12 ENCOUNTER — Ambulatory Visit (INDEPENDENT_AMBULATORY_CARE_PROVIDER_SITE_OTHER): Payer: Medicare Other | Admitting: Family Medicine

## 2017-09-12 VITALS — BP 124/62 | Ht 63.5 in | Wt 171.4 lb

## 2017-09-12 DIAGNOSIS — I10 Essential (primary) hypertension: Secondary | ICD-10-CM | POA: Diagnosis not present

## 2017-09-12 DIAGNOSIS — G4709 Other insomnia: Secondary | ICD-10-CM

## 2017-09-12 DIAGNOSIS — E782 Mixed hyperlipidemia: Secondary | ICD-10-CM

## 2017-09-12 DIAGNOSIS — Z23 Encounter for immunization: Secondary | ICD-10-CM | POA: Diagnosis not present

## 2017-09-12 DIAGNOSIS — E119 Type 2 diabetes mellitus without complications: Secondary | ICD-10-CM

## 2017-09-12 DIAGNOSIS — F320 Major depressive disorder, single episode, mild: Secondary | ICD-10-CM

## 2017-09-12 MED ORDER — SERTRALINE HCL 50 MG PO TABS: ORAL_TABLET | ORAL | 5 refills | Status: DC

## 2017-09-12 NOTE — Progress Notes (Signed)
   Subjective:    Patient ID: Christie [name], female    DOB: 03-17-1946, 71 y.o.   MRN: [id number]  Diabetes  She presents for her follow-up diabetic visit. She has type 2 diabetes mellitus. Hypoglycemia symptoms include dizziness. Pertinent negatives for hypoglycemia include no confusion. Pertinent negatives for diabetes include no chest pain, no fatigue, no polydipsia, no polyphagia and no weakness.  Patient does take her diabetes medicine does make her urinate a lot She also takes her cholesterol medicine she tries watch diet She recently had back surgery causing a lot of pain she did physical therapy this is interfering with her day-to-day quality She also underwent a very stressful emotional issue at church As a result she has recent progression moderate causing some difficulty with day-to-day function but no sign of suicidal ideation long discussion held regarding depression patient does agreed to medication  Patient in today to discuss recent labs work.   Has concerns of prescription.  25 minutes was spent with the patient. Greater than half the time was spent in discussion and answering questions and counseling regarding the issues that the patient came in for today.  Review of Systems  Constitutional: Negative for activity change, appetite change and fatigue.  HENT: Negative for congestion.   Respiratory: Negative for cough.   Cardiovascular: Negative for chest pain.  Gastrointestinal: Negative for abdominal pain.  Endocrine: Negative for polydipsia and polyphagia.  Musculoskeletal: Positive for back pain.  Skin: Negative for color change.  Neurological: Positive for dizziness. Negative for weakness.  Psychiatric/Behavioral: Negative for confusion.       Objective:   Physical Exam  Constitutional: She appears well-developed and well-nourished. No distress.  HENT:  Head: Normocephalic and atraumatic.  Eyes: Right eye exhibits no discharge. Left eye exhibits no discharge.    Neck: No tracheal deviation present.  Cardiovascular: Normal rate, regular rhythm and normal heart sounds.   No murmur heard. Pulmonary/Chest: Effort normal and breath sounds normal. No respiratory distress. She has no wheezes. She has no rales.  Musculoskeletal: She exhibits no edema.  Lymphadenopathy:    She has no cervical adenopathy.  Neurological: She is alert. She exhibits normal muscle tone.  Skin: Skin is warm and dry. No erythema.  Psychiatric: Her behavior is normal.  Vitals reviewed.         Assessment & Plan:  Diabetes-overall fairly good control continue current measures no low sugars Blood pressure patient has orthostatic changes it is necessary for her to stop the indapamide Depression we will stop Celexa and start Zoloft in follow the patient up in 4-6 weeks Hyperlipidemia continue current medication watch diet Intermittent dizziness related to possibly blood pressure happens more so when she turns over in bed at night no need to make any major changes currently we will follow this up at next visit Back pain from recent surgery along with some sciatica symptoms uses hydrocodone infrequently using gabapentin

## 2017-09-12 NOTE — Patient Instructions (Addendum)
Stop Indapamide Stop Celexa Start Zoloft 50 mg daily Follow-up here in 4 weeks May use melatonin 3 mg each evening

## 2017-09-13 ENCOUNTER — Encounter (HOSPITAL_COMMUNITY): Payer: Self-pay

## 2017-09-13 ENCOUNTER — Ambulatory Visit (HOSPITAL_COMMUNITY): Payer: Medicare Other | Attending: Neurosurgery

## 2017-09-13 DIAGNOSIS — G8929 Other chronic pain: Secondary | ICD-10-CM

## 2017-09-13 DIAGNOSIS — R29898 Other symptoms and signs involving the musculoskeletal system: Secondary | ICD-10-CM

## 2017-09-13 DIAGNOSIS — M6281 Muscle weakness (generalized): Secondary | ICD-10-CM

## 2017-09-13 DIAGNOSIS — M79604 Pain in right leg: Secondary | ICD-10-CM | POA: Diagnosis not present

## 2017-09-13 DIAGNOSIS — M545 Low back pain: Secondary | ICD-10-CM | POA: Insufficient documentation

## 2017-09-13 NOTE — Patient Instructions (Signed)
  SIT TO STAND - NO SUPPORT  Start by scooting close to the front of the chair.  Next, lean forward at your trunk and reach forward with your arms and rise to standing without using your hands to push off from the chair or other object.   Use your arms as a counter-balance by reaching forward when in sitting and lower them as you approach standing.   Perform 1x/day, 2-3 sets of 10 reps, making sure to control on the way down   LOOPED ELASTIC BAND HIP ABDUCTION  While standing with an elastic band looped around your ankles, move the target leg out to the side as shown.   Perform 1x/day, 2-3 sets of 10 reps on each side with the red band

## 2017-09-13 NOTE — Therapy (Addendum)
[place], [place], [address] Phone: [phone]   Fax:  [phone]  Physical Therapy Evaluation  Patient Details  Name: Christie [name] MRN: [id number] Date of Birth: 1946-10-17 Referring Provider: Sallee Lange, MD  Encounter Date: 09/13/2017      PT End of Session - 09/13/17 1604    Visit Number 1   Number of Visits 13   Date for PT Re-Evaluation 10/04/17   Authorization Type [employer] Medicare   Authorization Time Period 09/13/17 to 10/25/17   Authorization - Visit Number 1   Authorization - Number of Visits 10   PT Start Time 4580   PT Stop Time 1601   PT Time Calculation (min) 46 min   Activity Tolerance Patient tolerated treatment well   Behavior During Therapy [facility] for tasks assessed/performed      Past Medical History:  Diagnosis Date  . Cancer [facility]) 1999   external vulva lesion  . Depression   . DM (diabetes mellitus) ([place]) 2012   oral medications  . Elevated liver enzymes   . History of kidney stones   . Hyperlipidemia   . Hypertension 2000  . Spastic colon     Past Surgical History:  Procedure Laterality Date  . ANTERIOR LAT LUMBAR FUSION Right 07/04/2017   Procedure: Right LUMBAR TWO-THREE LUMBAR THREE-FOUR LUMBAR FOUR-FIVE Anterior lateral lumbar interbody fusion with percutaneous pedicle screws;  Surgeon: Erline Levine, MD;  Location: [place];  Service: Neurosurgery;  Laterality: Right;  Right lateral approach   . ANTERIOR LATERAL LUMBAR FUSION WITH PERCUTANEOUS SCREW 3 LEVEL N/A 07/04/2017   Procedure: PERCUTANEOUS SCREW THREE LEVEL LUMBAR TWO-THREE LUMBAR THREE-FOUR LUMBAR FOUR-FIVE;  Surgeon: Erline Levine, MD;  Location: [place];  Service: Neurosurgery;  Laterality: N/A;  Prone  . BACK SURGERY  2004 and 2017  . CHOLECYSTECTOMY    . COLONOSCOPY  feb 2005  . COLONOSCOPY N/A 02/13/2014   Procedure: COLONOSCOPY;  Surgeon: Rogene Houston, MD;  [facility];  Service: Endoscopy;  Laterality:  N/A;  930-moved to [address] Ann notified pt  . ESOPHAGOGASTRODUODENOSCOPY  09/2005  . EYE SURGERY Right    catatract removed  . EYE SURGERY Right    macular   surgery   . KIDNEY STONE SURGERY     for a staghorn kidney stone  . STAPEDECTOMY Bilateral    Right 1991 and Left 1994  . TOTAL ABDOMINAL HYSTERECTOMY  1982  . vulva cancer surgery  1999    There were no vitals filed for this visit.       Subjective Assessment - 09/13/17 1519    Subjective Pt states that she had lumbar fusion on 07/04/17, L2-5; she had pain down her sciatic nerve and the surgery helped that, but now she states that she is currently having the most issue with her RLE as it is burning and aching constantly. She states that her BLE weakness has gotten a little worse since her surgery. She has not had any HHPT or rehab following, and she says that she did not walk like she should have following the surgery. She denies any b/b changes or sharp shooting pains down the RLE. She saw her surgeon last Wednesday and he told her to begin weaning herself off of her brace. She is still abiding by her lumbar precautions. She reports that she hasn't been able to do her chores like she wants to due to her pain.    Limitations Lifting;Walking;House hold activities  How long can you sit comfortably? about 30 mins   How long can you stand comfortably? 15-20 mins, RLE pain increases   How long can you walk comfortably? maybe 30 mins, RLE pain increases   Patient Stated Goals get some strength back in legs   Currently in Pain? Yes   Pain Score 5    Pain Location Leg   Pain Orientation Right;Upper   Pain Descriptors / Indicators Burning;Tender   Pain Type Chronic pain   Pain Onset More than a month ago   Pain Frequency Constant   Aggravating Factors  sitting, standing, walking too long   Pain Relieving Factors pain meds   Effect of Pain on Daily Activities increases            [facility] PT Assessment - 09/13/17 0001       Assessment   Medical Diagnosis LBP   Referring Provider Sallee Lange, MD   Onset Date/Surgical Date 07/04/17  Lumbar fusion by Dr. Vertell Limber   Next MD Visit 10/17/17  f/u with surgeon Dr. Vertell Limber on 10/18/17   Prior Therapy yes for shoulder      Precautions   Precautions --  lumbar     Balance Screen   Has the patient fallen in the past 6 months No   Has the patient had a decrease in activity level because of a fear of falling?  Yes   Is the patient reluctant to leave their home because of a fear of falling?  No     Prior Function   Level of Independence Independent     ROM / Strength   AROM / PROM / Strength AROM;Strength     AROM   Overall AROM Comments lumbar ROM not fully assessed;    AROM Assessment Site Lumbar   Right Hip Internal Rotation  30   Left Hip Internal Rotation  20  painful   Lumbar Flexion min RFIS x 10 no change in RLE symptoms   Lumbar Extension min REIS x10 increased RLE symptoms     Strength   Right Hip Flexion 4/5   Right Hip Extension 2+/5   Right Hip ABduction 4-/5   Left Hip Flexion 4+/5   Left Hip Extension 4+/5   Left Hip ABduction 3+/5   Right Knee Flexion 4/5   Right Knee Extension 4+/5   Left Knee Flexion 5/5   Left Knee Extension 5/5   Right Ankle Dorsiflexion 4+/5   Left Ankle Dorsiflexion 4+/5     Flexibility   Soft Tissue Assessment /Muscle Length yes     Palpation   Palpation comment increased tenderness to R glute max, glute med, piriformis, TFL     Ambulation/Gait   Ambulation Distance (Feet) 602 Feet  3MWT   Assistive device None   Gait Pattern Step-through pattern;Decreased arm swing - right;Decreased arm swing - left;Trendelenburg   Gait velocity 1.31m/s   Gait Comments general decreased pelvic control throughout ambulatin indicating decreased core strength; increased RLE stinging/burning pain during test to about 6-7/10 (was at 5/10 at beginning of session)     Balance   Balance Assessed Yes     Static Standing  Balance   Static Standing - Balance Support No upper extremity supported   Static Standing Balance -  Activities  Single Leg Stance - Right Leg;Single Leg Stance - Left Leg   Static Standing - Comment/# of Minutes R: 7 sec or < L: 5 sec or <     Standardized Balance Assessment  Standardized Balance Assessment Five Times Sit to Stand;Timed Up and Go Test   Five times sit to stand comments  16 sec from chair, no UE      Timed Up and Go Test   Normal TUG (seconds) 11         Objective measurements completed on examination: See above findings.          PT Education - 09/13/17 1604    Education provided Yes   Education Details exam findings, POC, HEP   Person(s) Educated Patient   Methods Explanation;Demonstration;Handout   Comprehension Verbalized understanding;Returned demonstration          PT Short Term Goals - 09/13/17 1633      PT SHORT TERM GOAL #1   Title Pt will be independent with HEP and perform consistently to maximzie return to PLOF.   Time 3   Period Weeks   Status New   Target Date 10/04/17     PT SHORT TERM GOAL #2   Title Pt will have improved MMT of all muscle groups tested of at least 1/2 grade to decrease pain and maximize gait and balance.    Time 3   Period Weeks   Status New     PT SHORT TERM GOAL #3   Title Pt will be able to perform SLS on BLE for at least 10 sec with min to no unsteadiness to maximize gait on uneven ground.   Time 3   Period Weeks   Status New           PT Long Term Goals - 09/13/17 1634      PT LONG TERM GOAL #1   Title Pt will have improved MMT of all muscle groups tested by at least 1 grade to maximze gait and her ability to ambulate stairs with confidence.   Time 6   Period Weeks   Status New   Target Date 10/25/17     PT LONG TERM GOAL #2   Title Pt will have an improved tolerance to standing and walking by at least 50% (at least 30 mins for standing and 1 hour for walking) without exacerbations in RLE  pain to maximize her ability to perform chores at home.    Time 6   Period Weeks   Status New     PT LONG TERM GOAL #3   Title Pt will be able to perform 3MWT maintaining at least 1.76m/s gait speed and without exacerbations in RLE pain to demo improved tolerance to ambulation and maximize her function in the community.    Time 6   Period Weeks   Status New                Plan - 09/13/17 1615    Clinical Impression Statement Pt is pleasant 71 YO F who presents to OPPT with c/o RLE burning pain s/o L2-5 fusion on 07/04/17. Pt is having only mild LBP but is having increased RLE burning pain on her anterior thigh which started after her surgery. Pt currently presents with deficits in BLE MMT, R>L, functional strength, balance and gait. Pt also had deficits in hip IR ROM on the R. Pt needs skilled PT intervention to address these deficits in order to maximize her overall function and improve QOL.    History and Personal Factors relevant to plan of care: L2-5 fusion on 07/04/17; HTN, DM, hyperlipidemia   Clinical Presentation Stable   Clinical Presentation due to: MMT, balance, gait, 5xSTS, SLS,  3MWT, pain, R hip IR ROM   Clinical Decision Making Low   Rehab Potential Fair   PT Frequency 2x / week   PT Duration 6 weeks   PT Treatment/Interventions ADLs/Self Care Home Management;Aquatic Therapy;Cryotherapy;Electrical Stimulation;Moist Heat;DME Instruction;Gait training;Stair training;Functional mobility training;Therapeutic activities;Therapeutic exercise;Balance training;Neuromuscular re-education;Patient/family education;Manual techniques;Passive range of motion;Dry needling;Taping   PT Next Visit Plan review goals and HEP, bridging, s/l clams and hip abd, bent knee raise with pull downs, step ups, SLS with pulldown, manual to glutes and lumbar paraspinals, trial manual hip distraction on R   PT Home Exercise Plan eval: sit <> stands, standing hip abd with RTB   Consulted and Agree with  Plan of Care Patient      Patient will benefit from skilled therapeutic intervention in order to improve the following deficits and impairments:  Abnormal gait, Decreased activity tolerance, Decreased balance, Decreased endurance, Decreased mobility, Decreased range of motion, Decreased strength, Difficulty walking, Increased muscle spasms, Improper body mechanics, Impaired flexibility, Postural dysfunction, Pain  Visit Diagnosis: Pain in right leg - Plan: PT plan of care cert/re-cert  Muscle weakness (generalized) - Plan: PT plan of care cert/re-cert  Other symptoms and signs involving the musculoskeletal system - Plan: PT plan of care cert/re-cert  Chronic bilateral low back pain without sciatica - Plan: PT plan of care cert/re-cert      G-Codes - 19/14/78 1640    Functional Assessment Tool Used (Outpatient Only) clinical judgement, 5xSTS, 3MWT, SLS, MMT   Functional Limitation Mobility: Walking and moving around   Mobility: Walking and Moving Around Current Status (G9562) At least 40 percent but less than 60 percent impaired, limited or restricted   Mobility: Walking and Moving Around Goal Status (Z3086) At least 1 percent but less than 20 percent impaired, limited or restricted       Problem List Patient Active Problem List   Diagnosis Date Noted  . Lumbar spine scoliosis 07/04/2017  . Aortic stenosis, mild 11/14/2016  . Elevated alkaline phosphatase level 05/17/2015  . Urethral diverticulum 10/14/2014  . Lichen sclerosus et atrophicus 10/14/2014  . Poorly controlled type 2 diabetes mellitus ([place]) 09/29/2014  . Insomnia 09/29/2014  . Other malaise and fatigue 04/04/2014  . Depression 04/04/2014  . Essential hypertension, benign 08/13/2013  . Hyperlipidemia 08/13/2013  . History of kidney stones 08/13/2013  . Elevated liver enzymes 08/13/2013  . Vulvar cancer ([place]) 04/12/2013       Geraldine Solar PT, [place] [address] [place], [place], [address] Phone: [phone]   Fax:  [phone]  Name: Christie [name] MRN: [id number] Date of Birth: 1946/06/18

## 2017-09-15 ENCOUNTER — Encounter (HOSPITAL_COMMUNITY): Payer: Self-pay

## 2017-09-15 ENCOUNTER — Ambulatory Visit (HOSPITAL_COMMUNITY): Payer: Medicare Other

## 2017-09-15 DIAGNOSIS — M545 Low back pain, unspecified: Secondary | ICD-10-CM

## 2017-09-15 DIAGNOSIS — M79604 Pain in right leg: Secondary | ICD-10-CM

## 2017-09-15 DIAGNOSIS — R29898 Other symptoms and signs involving the musculoskeletal system: Secondary | ICD-10-CM

## 2017-09-15 DIAGNOSIS — G8929 Other chronic pain: Secondary | ICD-10-CM

## 2017-09-15 DIAGNOSIS — M6281 Muscle weakness (generalized): Secondary | ICD-10-CM

## 2017-09-15 NOTE — Patient Instructions (Signed)
  BRIDGING  While lying on your back, tighten your lower abdominals, squeeze your buttocks and then raise your buttocks off the floor/bed as creating a "Bridge" with your body. Hold and then lower yourself and repeat.  Perform 1x/day, 2-3 sets of 10 with heels on table and toes up   ELASTIC BAND - SIDELYING CLAM - CLAMSHELL   While lying on your side with your knees bent and an elastic band wrapped around your knees, draw up the top knee while keeping contact of your feet together as shown.   Do not let your pelvis roll back during the lifting movement.    Perform 1x/day, 2-3 sets of 10 with red band on each side

## 2017-09-15 NOTE — Therapy (Signed)
[place], [place], [address] Phone: [phone]   Fax:  [phone]  Physical Therapy Treatment  Patient Details  Name: Christie [name] MRN: [id number] Date of Birth: 12/06/1946 Referring Provider: Sallee Lange, MD  Encounter Date: 09/15/2017      PT End of Session - 09/15/17 1440    Visit Number 2   Number of Visits 13   Date for PT Re-Evaluation 10/04/17   Authorization Type UHC Medicare   Authorization Time Period 09/13/17 to 10/25/17   Authorization - Visit Number 2   Authorization - Number of Visits 10   PT Start Time 1440   PT Stop Time 1522   PT Time Calculation (min) 42 min   Activity Tolerance Patient tolerated treatment well   Behavior During Therapy [facility] for tasks assessed/performed      Past Medical History:  Diagnosis Date  . Cancer [facility]) 1999   external vulva lesion  . Depression   . DM (diabetes mellitus) ([place]) 2012   oral medications  . Elevated liver enzymes   . History of kidney stones   . Hyperlipidemia   . Hypertension 2000  . Spastic colon     Past Surgical History:  Procedure Laterality Date  . ANTERIOR LAT LUMBAR FUSION Right 07/04/2017   Procedure: Right LUMBAR TWO-THREE LUMBAR THREE-FOUR LUMBAR FOUR-FIVE Anterior lateral lumbar interbody fusion with percutaneous pedicle screws;  Surgeon: Erline Levine, MD;  Location: [place];  Service: Neurosurgery;  Laterality: Right;  Right lateral approach   . ANTERIOR LATERAL LUMBAR FUSION WITH PERCUTANEOUS SCREW 3 LEVEL N/A 07/04/2017   Procedure: PERCUTANEOUS SCREW THREE LEVEL LUMBAR TWO-THREE LUMBAR THREE-FOUR LUMBAR FOUR-FIVE;  Surgeon: Erline Levine, MD;  Location: [place];  Service: Neurosurgery;  Laterality: N/A;  Prone  . BACK SURGERY  2004 and 2017  . CHOLECYSTECTOMY    . COLONOSCOPY  feb 2005  . COLONOSCOPY N/A 02/13/2014   Procedure: COLONOSCOPY;  Surgeon: Rogene Houston, MD;  [facility];  Service: Endoscopy;  Laterality:  N/A;  930-moved to 100 Ann notified pt  . ESOPHAGOGASTRODUODENOSCOPY  09/2005  . EYE SURGERY Right    catatract removed  . EYE SURGERY Right    macular   surgery   . KIDNEY STONE SURGERY     for a staghorn kidney stone  . STAPEDECTOMY Bilateral    Right 1991 and Left 1994  . TOTAL ABDOMINAL HYSTERECTOMY  1982  . vulva cancer surgery  1999    There were no vitals filed for this visit.      Subjective Assessment - 09/15/17 1440    Subjective Pt states that she is tired and sore today. Her legs are sore from her sit <> stands and she had difficulty with the standing hip abd. She states taht her R leg is aching again today, about a 6/10.    Limitations Lifting;Walking;House hold activities   How long can you sit comfortably? about 30 mins   How long can you stand comfortably? 15-20 mins, RLE pain increases   How long can you walk comfortably? maybe 30 mins, RLE pain increases   Patient Stated Goals get some strength back in legs   Currently in Pain? Yes   Pain Score 6    Pain Location Leg   Pain Orientation Right;Upper   Pain Descriptors / Indicators Aching;Burning   Pain Type Chronic pain   Pain Onset More than a month ago   Pain Frequency Constant  Aggravating Factors  sitting, standing, walking too long   Pain Relieving Factors pain meds   Effect of Pain on Daily Activities increases           [facility] Adult PT Treatment/Exercise - 09/15/17 0001      Exercises   Exercises Lumbar     Lumbar Exercises: Stretches   Active Hamstring Stretch 2 reps;30 seconds   Active Hamstring Stretch Limitations BLE, supine with rope     Lumbar Exercises: Standing   Other Standing Lumbar Exercises hip abd with RTB x10 reps each     Lumbar Exercises: Seated   Sit to Stand 10 reps   Sit to Stand Limitations cues to drive through heels to increase posterior chain engagement     Lumbar Exercises: Supine   Bridge 10 reps   Bridge Limitations 2 sets, bil ankle DF to promote gluteal  activation     Lumbar Exercises: Sidelying   Clam 10 reps   Clam Limitations BLE, 2 sets, RTB     Manual Therapy   Manual Therapy Soft tissue mobilization;Myofascial release   Manual therapy comments completed separate rest of treatment   Soft tissue mobilization STM with the stick to R VLO, ITB   Myofascial Release MFR to R glute med and piriformis                PT Education - 09/15/17 1528    Education provided Yes   Education Details reviewed goals and issued copy of eval; exercise technique, added bridging and clamshells to HEP   Person(s) Educated Patient   Methods Explanation;Demonstration;Handout   Comprehension Verbalized understanding;Returned demonstration          PT Short Term Goals - 09/13/17 1633      PT SHORT TERM GOAL #1   Title Pt will be independent with HEP and perform consistently to maximzie return to PLOF.   Time 3   Period Weeks   Status New   Target Date 10/04/17     PT SHORT TERM GOAL #2   Title Pt will have improved MMT of all muscle groups tested of at least 1/2 grade to decrease pain and maximize gait and balance.    Time 3   Period Weeks   Status New     PT SHORT TERM GOAL #3   Title Pt will be able to perform SLS on BLE for at least 10 sec with min to no unsteadiness to maximize gait on uneven ground.   Time 3   Period Weeks   Status New           PT Long Term Goals - 09/13/17 1634      PT LONG TERM GOAL #1   Title Pt will have improved MMT of all muscle groups tested by at least 1 grade to maximze gait and her ability to ambulate stairs with confidence.   Time 6   Period Weeks   Status New   Target Date 10/25/17     PT LONG TERM GOAL #2   Title Pt will have an improved tolerance to standing and walking by at least 50% (at least 30 mins for standing and 1 hour for walking) without exacerbations in RLE pain to maximize her ability to perform chores at home.    Time 6   Period Weeks   Status New     PT LONG TERM  GOAL #3   Title Pt will be able to perform 3MWT maintaining at least 1.26m/s gait speed  and without exacerbations in RLE pain to demo improved tolerance to ambulation and maximize her function in the community.    Time 6   Period Weeks   Status New               Plan - 09/15/17 1537    Clinical Impression Statement Pt presents to PT without her brace as she states she left it in the car. Began session by reviewing initial evaluation goals; pt had no f/u questions or concerns. Pt had question regarding her HEP so pt assessed her form with no corrections needing to be made as she had good form throughout. Today's session focused on gluteal strengthening and pt was also noted to have max restrictions of R VLO and mod restrictions of R glute med and piriformis. Most of session spent performing MFR and STM with the stick to these areas. Pt reported no burning or aching pain following and had 0/10 pain at EOS.    Rehab Potential Fair   PT Frequency 2x / week   PT Duration 6 weeks   PT Treatment/Interventions ADLs/Self Care Home Management;Aquatic Therapy;Cryotherapy;Electrical Stimulation;Moist Heat;DME Instruction;Gait training;Stair training;Functional mobility training;Therapeutic activities;Therapeutic exercise;Balance training;Neuromuscular re-education;Patient/family education;Manual techniques;Passive range of motion;Dry needling;Taping   PT Next Visit Plan add bent knee raise with pull downs for core, step ups, SLS with pulldown, continue manual to glutes and VLO (lumbar paraspinals PRN); add prone quad stretch   PT Home Exercise Plan eval: sit <> stands, standing hip abd with RTB; 9/28: bridging with ankle DF, clams with RTB   Consulted and Agree with Plan of Care Patient      Patient will benefit from skilled therapeutic intervention in order to improve the following deficits and impairments:  Abnormal gait, Decreased activity tolerance, Decreased balance, Decreased endurance, Decreased  mobility, Decreased range of motion, Decreased strength, Difficulty walking, Increased muscle spasms, Improper body mechanics, Impaired flexibility, Postural dysfunction, Pain  Visit Diagnosis: Pain in right leg  Muscle weakness (generalized)  Other symptoms and signs involving the musculoskeletal system  Chronic bilateral low back pain without sciatica     Problem List Patient Active Problem List   Diagnosis Date Noted  . Lumbar spine scoliosis 07/04/2017  . Aortic stenosis, mild 11/14/2016  . Elevated alkaline phosphatase level 05/17/2015  . Urethral diverticulum 10/14/2014  . Lichen sclerosus et atrophicus 10/14/2014  . Poorly controlled type 2 diabetes mellitus ([place]) 09/29/2014  . Insomnia 09/29/2014  . Other malaise and fatigue 04/04/2014  . Depression 04/04/2014  . Essential hypertension, benign 08/13/2013  . Hyperlipidemia 08/13/2013  . History of kidney stones 08/13/2013  . Elevated liver enzymes 08/13/2013  . Vulvar cancer ([place]) 04/12/2013      Geraldine Solar PT, [place] [address] [place], [place], [address] Phone: [phone]   Fax:  [phone]  Name: Christie [name] MRN: [id number] Date of Birth: 27-Jan-1946

## 2017-09-20 ENCOUNTER — Telehealth: Payer: Self-pay | Admitting: Family Medicine

## 2017-09-20 ENCOUNTER — Encounter (HOSPITAL_COMMUNITY): Payer: Self-pay

## 2017-09-20 ENCOUNTER — Ambulatory Visit (HOSPITAL_COMMUNITY): Payer: Medicare Other | Attending: Neurosurgery

## 2017-09-20 DIAGNOSIS — M79604 Pain in right leg: Secondary | ICD-10-CM | POA: Diagnosis not present

## 2017-09-20 DIAGNOSIS — M545 Low back pain: Secondary | ICD-10-CM | POA: Diagnosis present

## 2017-09-20 DIAGNOSIS — G8929 Other chronic pain: Secondary | ICD-10-CM | POA: Insufficient documentation

## 2017-09-20 DIAGNOSIS — R29898 Other symptoms and signs involving the musculoskeletal system: Secondary | ICD-10-CM | POA: Insufficient documentation

## 2017-09-20 DIAGNOSIS — M6281 Muscle weakness (generalized): Secondary | ICD-10-CM | POA: Insufficient documentation

## 2017-09-20 NOTE — Telephone Encounter (Signed)
Patient notified that Dr Nicki Reaper will review the message tomorrow.

## 2017-09-20 NOTE — Telephone Encounter (Signed)
Patient was taken off of her diuretic last week by Dr. Nicki Reaper.  She said this week her ankles have started swelling again and her blood pressure is running high again, 172/83.  She wants to know what Dr. Nicki Reaper recommends she do?

## 2017-09-20 NOTE — Telephone Encounter (Signed)
1.25 mg ( dragon typo error thank you)

## 2017-09-20 NOTE — Telephone Encounter (Signed)
Lozol is not available in 1.5mg . It is available in 1.25mg  per EPIC. Please advise?

## 2017-09-20 NOTE — Therapy (Signed)
[place], [place], [address] Phone: [phone]   Fax:  [phone]  Physical Therapy Treatment  Patient Details  Name: Christie [name] MRN: [id number] Date of Birth: 01/06/1946 Referring Provider: Sallee Lange, MD  Encounter Date: 09/20/2017      PT End of Session - 09/20/17 1434    Visit Number 3   Number of Visits 13   Date for PT Re-Evaluation 10/04/17   Authorization Type [employer] Medicare   Authorization Time Period 09/13/17 to 10/25/17   Authorization - Visit Number 3   Authorization - Number of Visits 10   PT Start Time 9678   PT Stop Time 1513   PT Time Calculation (min) 39 min   Activity Tolerance Patient tolerated treatment well   Behavior During Therapy [facility] for tasks assessed/performed      Past Medical History:  Diagnosis Date  . Cancer [facility]) 1999   external vulva lesion  . Depression   . DM (diabetes mellitus) ([place]) 2012   oral medications  . Elevated liver enzymes   . History of kidney stones   . Hyperlipidemia   . Hypertension 2000  . Spastic colon     Past Surgical History:  Procedure Laterality Date  . ANTERIOR LAT LUMBAR FUSION Right 07/04/2017   Procedure: Right LUMBAR TWO-THREE LUMBAR THREE-FOUR LUMBAR FOUR-FIVE Anterior lateral lumbar interbody fusion with percutaneous pedicle screws;  Surgeon: Erline Levine, MD;  Location: [place];  Service: Neurosurgery;  Laterality: Right;  Right lateral approach   . ANTERIOR LATERAL LUMBAR FUSION WITH PERCUTANEOUS SCREW 3 LEVEL N/A 07/04/2017   Procedure: PERCUTANEOUS SCREW THREE LEVEL LUMBAR TWO-THREE LUMBAR THREE-FOUR LUMBAR FOUR-FIVE;  Surgeon: Erline Levine, MD;  Location: [place];  Service: Neurosurgery;  Laterality: N/A;  Prone  . BACK SURGERY  2004 and 2017  . CHOLECYSTECTOMY    . COLONOSCOPY  feb 2005  . COLONOSCOPY N/A 02/13/2014   Procedure: COLONOSCOPY;  Surgeon: Rogene Houston, MD;  [facility];  Service: Endoscopy;  Laterality:  N/A;  930-moved to [address] notified pt  . ESOPHAGOGASTRODUODENOSCOPY  09/2005  . EYE SURGERY Right    catatract removed  . EYE SURGERY Right    macular   surgery   . KIDNEY STONE SURGERY     for a staghorn kidney stone  . STAPEDECTOMY Bilateral    Right 1991 and Left 1994  . TOTAL ABDOMINAL HYSTERECTOMY  1982  . vulva cancer surgery  1999    There were no vitals filed for this visit.      Subjective Assessment - 09/20/17 1434    Subjective Pt staes that she is not having a good day today. She reports that her doctor took her off of her diuretic last week and ever since, she has been retaining fluid and her BP has been up. BP at beginning of session was 176/86.    Limitations Lifting;Walking;House hold activities   How long can you sit comfortably? about 30 mins   How long can you stand comfortably? 15-20 mins, RLE pain increases   How long can you walk comfortably? maybe 30 mins, RLE pain increases   Patient Stated Goals get some strength back in legs   Currently in Pain? Yes   Pain Score 4    Pain Location Leg   Pain Orientation Right   Pain Descriptors / Indicators Burning   Pain Type Chronic pain   Pain Onset More than a month ago  Pain Frequency Constant   Aggravating Factors  sitting, standing, walking too long   Pain Relieving Factors pain meds   Effect of Pain on Daily Activities increases              [facility] Adult PT Treatment/Exercise - 09/20/17 0001      Lumbar Exercises: Standing   Other Standing Lumbar Exercises sidestep with RTB 72ft X2RT     Lumbar Exercises: Supine   Bent Knee Raise 10 reps   Bent Knee Raise Limitations 2 sets, with pulldowns with purple band   Bridge 10 reps   Bridge Limitations 3 sets, bil ankle DF, RTB around knees     Lumbar Exercises: Sidelying   Hip Abduction 10 reps   Hip Abduction Limitations 2 sets, BLE     Manual Therapy   Manual Therapy Soft tissue mobilization;Myofascial release   Manual therapy comments  completed separate rest of treatment   Soft tissue mobilization STM with the stick to R VLO, ITB, and adductors   Myofascial Release MFR to R glute med and piriformis              PT Education - 09/20/17 1439    Education provided Yes   Education Details continue current HEP   Person(s) Educated Patient   Methods Explanation;Demonstration   Comprehension Verbalized understanding;Returned demonstration          PT Short Term Goals - 09/13/17 1633      PT SHORT TERM GOAL #1   Title Pt will be independent with HEP and perform consistently to maximzie return to PLOF.   Time 3   Period Weeks   Status New   Target Date 10/04/17     PT SHORT TERM GOAL #2   Title Pt will have improved MMT of all muscle groups tested of at least 1/2 grade to decrease pain and maximize gait and balance.    Time 3   Period Weeks   Status New     PT SHORT TERM GOAL #3   Title Pt will be able to perform SLS on BLE for at least 10 sec with min to no unsteadiness to maximize gait on uneven ground.   Time 3   Period Weeks   Status New           PT Long Term Goals - 09/13/17 1634      PT LONG TERM GOAL #1   Title Pt will have improved MMT of all muscle groups tested by at least 1 grade to maximze gait and her ability to ambulate stairs with confidence.   Time 6   Period Weeks   Status New   Target Date 10/25/17     PT LONG TERM GOAL #2   Title Pt will have an improved tolerance to standing and walking by at least 50% (at least 30 mins for standing and 1 hour for walking) without exacerbations in RLE pain to maximize her ability to perform chores at home.    Time 6   Period Weeks   Status New     PT LONG TERM GOAL #3   Title Pt will be able to perform 3MWT maintaining at least 1.64m/s gait speed and without exacerbations in RLE pain to demo improved tolerance to ambulation and maximize her function in the community.    Time 6   Period Weeks   Status New               Plan -  09/20/17 1513  Clinical Impression Statement -Pt presented to therapy feeling under the weather so limited session to pt's tolerance. She reported that her BP has been high since her MD took her off of her diuretic and it was 176/86. Session focused on gluteal strengthening and core strengthening as well as improving soft tissue restrictions. Pt continues with restrictions of glute med, piriformis, VLO and VMO noted to be tight this date. She reported 1/10 pain at EOS.   Rehab Potential Fair   PT Frequency 2x / week   PT Duration 6 weeks   PT Treatment/Interventions ADLs/Self Care Home Management;Aquatic Therapy;Cryotherapy;Electrical Stimulation;Moist Heat;DME Instruction;Gait training;Stair training;Functional mobility training;Therapeutic activities;Therapeutic exercise;Balance training;Neuromuscular re-education;Patient/family education;Manual techniques;Passive range of motion;Dry needling;Taping   PT Next Visit Plan add step ups, SLS with pulldown, continue manual to glutes, VLO, and VMO(lumbar paraspinals PRN); add prone quad stretch   PT Home Exercise Plan eval: sit <> stands, standing hip abd with RTB; 9/28: bridging with ankle DF, clams with RTB   Consulted and Agree with Plan of Care Patient      Patient will benefit from skilled therapeutic intervention in order to improve the following deficits and impairments:  Abnormal gait, Decreased activity tolerance, Decreased balance, Decreased endurance, Decreased mobility, Decreased range of motion, Decreased strength, Difficulty walking, Increased muscle spasms, Improper body mechanics, Impaired flexibility, Postural dysfunction, Pain  Visit Diagnosis: Pain in right leg  Muscle weakness (generalized)  Other symptoms and signs involving the musculoskeletal system  Chronic bilateral low back pain without sciatica     Problem List Patient Active Problem List   Diagnosis Date Noted  . Lumbar spine scoliosis 07/04/2017  . Aortic  stenosis, mild 11/14/2016  . Elevated alkaline phosphatase level 05/17/2015  . Urethral diverticulum 10/14/2014  . Lichen sclerosus et atrophicus 10/14/2014  . Poorly controlled type 2 diabetes mellitus ([place]) 09/29/2014  . Insomnia 09/29/2014  . Other malaise and fatigue 04/04/2014  . Depression 04/04/2014  . Essential hypertension, benign 08/13/2013  . Hyperlipidemia 08/13/2013  . History of kidney stones 08/13/2013  . Elevated liver enzymes 08/13/2013  . Vulvar cancer ([place]) 04/12/2013       Geraldine Solar PT, [place] [address] [place], [place], [address] Phone: [phone]   Fax:  [phone]  Name: ELENY CORTEZ MRN: [id number] Date of Birth: [birth year]

## 2017-09-20 NOTE — Telephone Encounter (Signed)
Left message to return call 

## 2017-09-20 NOTE — Telephone Encounter (Signed)
I would recommend that we initiate indapamide but at a lower dose. I recommend indapamide 1.5 mg 1 every morning, #30, 5 refills. Please change this medication order in Epic to reflect this. The patient should check blood pressures periodically keep a written log and follow-up in 2 to 3 weeks to follow-up on blood pressure as well as swelling

## 2017-09-20 NOTE — Telephone Encounter (Signed)
Let pt know today Christie [name] to seee tomorrow he knows her situation better

## 2017-09-20 NOTE — Telephone Encounter (Signed)
Indapamide was stopped 09/12/17 by Dr Nicki Reaper due to orthostatic changes.

## 2017-09-21 MED ORDER — INDAPAMIDE 1.25 MG PO TABS: 1.2500 mg | ORAL_TABLET | Freq: Every day | ORAL | 5 refills | Status: DC

## 2017-09-21 NOTE — Telephone Encounter (Signed)
Prescription sent electronically to pharmacy. Patient notified and stated she has follow up scheduled 10/17/17.

## 2017-09-22 ENCOUNTER — Encounter (HOSPITAL_COMMUNITY): Payer: Self-pay

## 2017-09-22 ENCOUNTER — Ambulatory Visit (HOSPITAL_COMMUNITY): Payer: Medicare Other

## 2017-09-22 DIAGNOSIS — R29898 Other symptoms and signs involving the musculoskeletal system: Secondary | ICD-10-CM

## 2017-09-22 DIAGNOSIS — M79604 Pain in right leg: Secondary | ICD-10-CM

## 2017-09-22 DIAGNOSIS — M6281 Muscle weakness (generalized): Secondary | ICD-10-CM

## 2017-09-22 NOTE — Patient Instructions (Addendum)
  ELASTIC BAND LATERAL WALKS   With an elastic band around both ankles, walk to the side while keeping your feet spread apart. Keep your knees bent the entire time.  Perform 1x/day, use red band; perform multiple laps back and forth either in hallway or at kitchen counter    FROG Hip adductor and groin stretch supine  Lying supine with the knees bent, feet together, let the knees fall out to the side until a stretch is felt through the groin.    Perform 1-2x/day, 3-5 stretches holding for 30-60 seconds each

## 2017-09-22 NOTE — Therapy (Signed)
[place], [place], [address] Phone: [phone]   Fax:  [phone]  Physical Therapy Treatment  Patient Details  Name: Christie [name] MRN: [id number] Date of Birth: 09-07-1946 Referring Provider: Sallee Lange, MD  Encounter Date: 09/22/2017      PT End of Session - 09/22/17 1432    Visit Number 4   Number of Visits 13   Date for PT Re-Evaluation 10/04/17   Authorization Type [employer] Medicare   Authorization Time Period 09/13/17 to 10/25/17   Authorization - Visit Number 4   Authorization - Number of Visits 10   PT Start Time 2683   PT Stop Time 1514   PT Time Calculation (min) 43 min   Activity Tolerance Patient tolerated treatment well   Behavior During Therapy [facility] for tasks assessed/performed      Past Medical History:  Diagnosis Date  . Cancer [facility]) 1999   external vulva lesion  . Depression   . DM (diabetes mellitus) ([place]) 2012   oral medications  . Elevated liver enzymes   . History of kidney stones   . Hyperlipidemia   . Hypertension 2000  . Spastic colon     Past Surgical History:  Procedure Laterality Date  . ANTERIOR LAT LUMBAR FUSION Right 07/04/2017   Procedure: Right LUMBAR TWO-THREE LUMBAR THREE-FOUR LUMBAR FOUR-FIVE Anterior lateral lumbar interbody fusion with percutaneous pedicle screws;  Surgeon: Erline Levine, MD;  Location: [place];  Service: Neurosurgery;  Laterality: Right;  Right lateral approach   . ANTERIOR LATERAL LUMBAR FUSION WITH PERCUTANEOUS SCREW 3 LEVEL N/A 07/04/2017   Procedure: PERCUTANEOUS SCREW THREE LEVEL LUMBAR TWO-THREE LUMBAR THREE-FOUR LUMBAR FOUR-FIVE;  Surgeon: Erline Levine, MD;  Location: [place];  Service: Neurosurgery;  Laterality: N/A;  Prone  . BACK SURGERY  2004 and 2017  . CHOLECYSTECTOMY    . COLONOSCOPY  feb 2005  . COLONOSCOPY N/A 02/13/2014   Procedure: COLONOSCOPY;  Surgeon: Rogene Houston, MD;  [facility];  Service: Endoscopy;  Laterality:  N/A;  930-moved to 100 [address] notified pt  . ESOPHAGOGASTRODUODENOSCOPY  09/2005  . EYE SURGERY Right    catatract removed  . EYE SURGERY Right    macular   surgery   . KIDNEY STONE SURGERY     for a staghorn kidney stone  . STAPEDECTOMY Bilateral    Right 1991 and Left 1994  . TOTAL ABDOMINAL HYSTERECTOMY  1982  . vulva cancer surgery  1999    There were no vitals filed for this visit.      Subjective Assessment - 09/22/17 1432    Subjective Pt states that she was put back on a lower dose of her diuretic this morning. Her BP was still high this morning. Her back has been bugging on her a little bit today, she feels like it is a catch. Her RLE is feeling better but she's having a spasm in her adductors near the knee.    Limitations Lifting;Walking;House hold activities   How long can you sit comfortably? about 30 mins   How long can you stand comfortably? 15-20 mins, RLE pain increases   How long can you walk comfortably? maybe 30 mins, RLE pain increases   Patient Stated Goals get some strength back in legs   Currently in Pain? Yes   Pain Score 4    Pain Location Back  and RLE   Pain Orientation Right;Lower   Pain Descriptors / Indicators Aching;Burning  Pain Type Chronic pain   Pain Onset More than a month ago   Pain Frequency Constant   Aggravating Factors  sitting, standing, walking too long    Pain Relieving Factors pain meds   Effect of Pain on Daily Activities increases              [facility] Adult PT Treatment/Exercise - 09/22/17 0001      Lumbar Exercises: Stretches   Standing Extension Limitations supine frog stretch 2x30     Lumbar Exercises: Standing   Other Standing Lumbar Exercises fwd/lat step ups on 6" step x10 reps each   Other Standing Lumbar Exercises SLS with vectors x5RT each     Lumbar Exercises: Supine   Bridge 10 reps   Bridge Limitations 2 sets, feet on BOSU     Lumbar Exercises: Quadruped   Single Arm Raises Limitations firehydrants with  RTB 2x10 each   Straight Leg Raises Limitations LE extension x5 reps each     Manual Therapy   Manual Therapy Soft tissue mobilization;Myofascial release   Manual therapy comments completed separate rest of treatment   Soft tissue mobilization STM to middle adductors with the stick   Myofascial Release MFR to R glute med              PT Education - 09/22/17 1457    Education provided Yes   Education Details add sidestepping and frog stretch to HEP   Person(s) Educated Patient   Methods Explanation;Demonstration;Handout   Comprehension Verbalized understanding;Returned demonstration          PT Short Term Goals - 09/13/17 1633      PT SHORT TERM GOAL #1   Title Pt will be independent with HEP and perform consistently to maximzie return to PLOF.   Time 3   Period Weeks   Status New   Target Date 10/04/17     PT SHORT TERM GOAL #2   Title Pt will have improved MMT of all muscle groups tested of at least 1/2 grade to decrease pain and maximize gait and balance.    Time 3   Period Weeks   Status New     PT SHORT TERM GOAL #3   Title Pt will be able to perform SLS on BLE for at least 10 sec with min to no unsteadiness to maximize gait on uneven ground.   Time 3   Period Weeks   Status New           PT Long Term Goals - 09/13/17 1634      PT LONG TERM GOAL #1   Title Pt will have improved MMT of all muscle groups tested by at least 1 grade to maximze gait and her ability to ambulate stairs with confidence.   Time 6   Period Weeks   Status New   Target Date 10/25/17     PT LONG TERM GOAL #2   Title Pt will have an improved tolerance to standing and walking by at least 50% (at least 30 mins for standing and 1 hour for walking) without exacerbations in RLE pain to maximize her ability to perform chores at home.    Time 6   Period Weeks   Status New     PT LONG TERM GOAL #3   Title Pt will be able to perform 3MWT maintaining at least 1.21m/s gait speed and  without exacerbations in RLE pain to demo improved tolerance to ambulation and maximize her function in the community.  Time 6   Period Weeks   Status New               Plan - 09/22/17 1515    Clinical Impression Statement Pt reports being put back on her diuretic at a lower dose this morning; her BP at beginning of session was 164/75. Session focused on gluteal and core strengthening as well as addressing soft tissue restrictions in R glute med and adductors. Twitch response elicited in glute med during [place]; pt reported no pain at EOS   Rehab Potential Fair   PT Frequency 2x / week   PT Duration 6 weeks   PT Treatment/Interventions ADLs/Self Care Home Management;Aquatic Therapy;Cryotherapy;Electrical Stimulation;Moist Heat;DME Instruction;Gait training;Stair training;Functional mobility training;Therapeutic activities;Therapeutic exercise;Balance training;Neuromuscular re-education;Patient/family education;Manual techniques;Passive range of motion;Dry needling;Taping   PT Next Visit Plan add prone quad stretch and SLS with pulldowns; continue glute and core strengthening; continue manual to glutes, VLO, and VMO(lumbar paraspinals PRN);   PT Home Exercise Plan eval: sit <> stands, standing hip abd with RTB; 9/28: bridging with ankle DF, clams with RTB; 10/5: sidestep with RTB, supine frog stretch   Consulted and Agree with Plan of Care Patient      Patient will benefit from skilled therapeutic intervention in order to improve the following deficits and impairments:  Abnormal gait, Decreased activity tolerance, Decreased balance, Decreased endurance, Decreased mobility, Decreased range of motion, Decreased strength, Difficulty walking, Increased muscle spasms, Improper body mechanics, Impaired flexibility, Postural dysfunction, Pain  Visit Diagnosis: Pain in right leg  Muscle weakness (generalized)  Other symptoms and signs involving the musculoskeletal system     Problem  List Patient Active Problem List   Diagnosis Date Noted  . Lumbar spine scoliosis 07/04/2017  . Aortic stenosis, mild 11/14/2016  . Elevated alkaline phosphatase level 05/17/2015  . Urethral diverticulum 10/14/2014  . Lichen sclerosus et atrophicus 10/14/2014  . Poorly controlled type 2 diabetes mellitus ([place]) 09/29/2014  . Insomnia 09/29/2014  . Other malaise and fatigue 04/04/2014  . Depression 04/04/2014  . Essential hypertension, benign 08/13/2013  . Hyperlipidemia 08/13/2013  . History of kidney stones 08/13/2013  . Elevated liver enzymes 08/13/2013  . Vulvar cancer ([place]) 04/12/2013       Geraldine Solar PT, [place] [address] [place], [place], [address] Phone: [phone]   Fax:  [phone]  Name: WILLY VORCE MRN: [id number] Date of Birth: [birth year]

## 2017-09-27 ENCOUNTER — Ambulatory Visit (HOSPITAL_COMMUNITY): Payer: Medicare Other

## 2017-09-27 ENCOUNTER — Encounter (HOSPITAL_COMMUNITY): Payer: Self-pay

## 2017-09-27 DIAGNOSIS — M79604 Pain in right leg: Secondary | ICD-10-CM

## 2017-09-27 DIAGNOSIS — G8929 Other chronic pain: Secondary | ICD-10-CM

## 2017-09-27 DIAGNOSIS — M545 Low back pain, unspecified: Secondary | ICD-10-CM

## 2017-09-27 DIAGNOSIS — M6281 Muscle weakness (generalized): Secondary | ICD-10-CM

## 2017-09-27 DIAGNOSIS — R29898 Other symptoms and signs involving the musculoskeletal system: Secondary | ICD-10-CM

## 2017-09-27 NOTE — Therapy (Signed)
[place], [place], [address] Phone: [phone]   Fax:  [phone]  Physical Therapy Treatment  Patient Details  Name: Christie [name] MRN: [id number] Date of Birth: [birth year] Referring Provider: Sallee Lange, MD  Encounter Date: 09/27/2017      PT End of Session - 09/27/17 1520    Visit Number 5   Number of Visits 13   Date for PT Re-Evaluation 10/04/17   Authorization Type [employer] Medicare   Authorization Time Period 09/13/17 to 10/25/17   Authorization - Visit Number 5   Authorization - Number of Visits 10   PT Start Time 1520   PT Stop Time 1606   PT Time Calculation (min) 46 min   Activity Tolerance Patient tolerated treatment well   Behavior During Therapy [facility] for tasks assessed/performed      Past Medical History:  Diagnosis Date  . Cancer [facility]) 1999   external vulva lesion  . Depression   . DM (diabetes mellitus) ([place]) 2012   oral medications  . Elevated liver enzymes   . History of kidney stones   . Hyperlipidemia   . Hypertension 2000  . Spastic colon     Past Surgical History:  Procedure Laterality Date  . ANTERIOR LAT LUMBAR FUSION Right 07/04/2017   Procedure: Right LUMBAR TWO-THREE LUMBAR THREE-FOUR LUMBAR FOUR-FIVE Anterior lateral lumbar interbody fusion with percutaneous pedicle screws;  Surgeon: Erline Levine, MD;  Location: [place];  Service: Neurosurgery;  Laterality: Right;  Right lateral approach   . ANTERIOR LATERAL LUMBAR FUSION WITH PERCUTANEOUS SCREW 3 LEVEL N/A 07/04/2017   Procedure: PERCUTANEOUS SCREW THREE LEVEL LUMBAR TWO-THREE LUMBAR THREE-FOUR LUMBAR FOUR-FIVE;  Surgeon: Erline Levine, MD;  Location: [place];  Service: Neurosurgery;  Laterality: N/A;  Prone  . BACK SURGERY  2004 and 2017  . CHOLECYSTECTOMY    . COLONOSCOPY  feb 2005  . COLONOSCOPY N/A 02/13/2014   Procedure: COLONOSCOPY;  Surgeon: Rogene Houston, MD;  [facility];  Service: Endoscopy;  Laterality:  N/A;  930-moved to [address] notified pt  . ESOPHAGOGASTRODUODENOSCOPY  09/2005  . EYE SURGERY Right    catatract removed  . EYE SURGERY Right    macular   surgery   . KIDNEY STONE SURGERY     for a staghorn kidney stone  . STAPEDECTOMY Bilateral    Right 1991 and Left 1994  . TOTAL ABDOMINAL HYSTERECTOMY  1982  . vulva cancer surgery  1999    There were no vitals filed for this visit.      Subjective Assessment - 09/27/17 1520    Subjective Pt states that she is having some increased achiness today, which she attributes to the weather. Overall though, she feels like she is making progress.    Limitations Lifting;Walking;House hold activities   How long can you sit comfortably? about 30 mins   How long can you stand comfortably? 15-20 mins, RLE pain increases   How long can you walk comfortably? maybe 30 mins, RLE pain increases   Patient Stated Goals get some strength back in legs   Currently in Pain? Yes   Pain Score 4    Pain Location Back   Pain Orientation Right;Lower   Pain Descriptors / Indicators Aching;Spasm  back aching, R leg spasms   Pain Type Chronic pain   Pain Radiating Towards R knee and down is    Pain Onset More than a month ago   Pain Frequency Constant  Aggravating Factors  sitting, standing, walking too long   Pain Relieving Factors pain meds   Effect of Pain on Daily Activities increases               [facility] Adult PT Treatment/Exercise - 09/27/17 0001      Lumbar Exercises: Stretches   Quad Stretch 3 reps;30 seconds   Quad Stretch Limitations prone with rope     Lumbar Exercises: Standing   Other Standing Lumbar Exercises fwd step ups on 6" step x10 reps each   Other Standing Lumbar Exercises SLS with vectors x5RT each; bil SLS and pulldowns with march x3" holds RTB x10 reps each     Lumbar Exercises: Seated   LAQ on Ball Limitations bil march with pulldown while sitting on dynadisc 2x10 reps     Lumbar Exercises: Prone   Straight Leg  Raise 10 reps   Straight Leg Raises Limitations 2 sets, BLE, RLE knee bent     Manual Therapy   Manual Therapy Soft tissue mobilization;Myofascial release   Manual therapy comments completed separate rest of treatment   Soft tissue mobilization instrument assisted STM to R adductors, VMO, VLO, ITB   Myofascial Release MFR to R iliopsoas              PT Short Term Goals - 09/13/17 1633      PT SHORT TERM GOAL #1   Title Pt will be independent with HEP and perform consistently to maximzie return to PLOF.   Time 3   Period Weeks   Status New   Target Date 10/04/17     PT SHORT TERM GOAL #2   Title Pt will have improved MMT of all muscle groups tested of at least 1/2 grade to decrease pain and maximize gait and balance.    Time 3   Period Weeks   Status New     PT SHORT TERM GOAL #3   Title Pt will be able to perform SLS on BLE for at least 10 sec with min to no unsteadiness to maximize gait on uneven ground.   Time 3   Period Weeks   Status New           PT Long Term Goals - 09/13/17 1634      PT LONG TERM GOAL #1   Title Pt will have improved MMT of all muscle groups tested by at least 1 grade to maximze gait and her ability to ambulate stairs with confidence.   Time 6   Period Weeks   Status New   Target Date 10/25/17     PT LONG TERM GOAL #2   Title Pt will have an improved tolerance to standing and walking by at least 50% (at least 30 mins for standing and 1 hour for walking) without exacerbations in RLE pain to maximize her ability to perform chores at home.    Time 6   Period Weeks   Status New     PT LONG TERM GOAL #3   Title Pt will be able to perform 3MWT maintaining at least 1.39m/s gait speed and without exacerbations in RLE pain to demo improved tolerance to ambulation and maximize her function in the community.    Time 6   Period Weeks   Status New               Plan - 09/27/17 1612    Clinical Impression Statement Pt presents to  therapy with c/o increased achiness this date, but  overall she does feel like she is improving. BP 134/66 this date. Session focused on improving core stabilization, gluteal strength, and soft tissue restrictions. PT performed MFR to R iliopsoas and IASTM to R adductors, VMO, VLO, ITB this date. Pt was tender to iliopsoas MFR but reported 0/10 pain at EOS. Will continue to assess iliopsoas in future sessions and potentially add prone quad stretch to HEP next session.   Rehab Potential Fair   PT Frequency 2x / week   PT Duration 6 weeks   PT Treatment/Interventions ADLs/Self Care Home Management;Aquatic Therapy;Cryotherapy;Electrical Stimulation;Moist Heat;DME Instruction;Gait training;Stair training;Functional mobility training;Therapeutic activities;Therapeutic exercise;Balance training;Neuromuscular re-education;Patient/family education;Manual techniques;Passive range of motion;Dry needling;Taping   PT Next Visit Plan continue prone quad stretch and SLS with pulldowns; continue glute and core strengthening; continue manual to glutes, VLO, and VMO(lumbar paraspinals PRN); continue MFR to R iliopsoas; add heel taps   PT Home Exercise Plan eval: sit <> stands, standing hip abd with RTB; 9/28: bridging with ankle DF, clams with RTB; 10/5: sidestep with RTB, supine frog stretch   Consulted and Agree with Plan of Care Patient      Patient will benefit from skilled therapeutic intervention in order to improve the following deficits and impairments:  Abnormal gait, Decreased activity tolerance, Decreased balance, Decreased endurance, Decreased mobility, Decreased range of motion, Decreased strength, Difficulty walking, Increased muscle spasms, Improper body mechanics, Impaired flexibility, Postural dysfunction, Pain  Visit Diagnosis: Pain in right leg  Muscle weakness (generalized)  Other symptoms and signs involving the musculoskeletal system  Chronic bilateral low back pain without  sciatica     Problem List Patient Active Problem List   Diagnosis Date Noted  . Lumbar spine scoliosis 07/04/2017  . Aortic stenosis, mild 11/14/2016  . Elevated alkaline phosphatase level 05/17/2015  . Urethral diverticulum 10/14/2014  . Lichen sclerosus et atrophicus 10/14/2014  . Poorly controlled type 2 diabetes mellitus ([place]) 09/29/2014  . Insomnia 09/29/2014  . Other malaise and fatigue 04/04/2014  . Depression 04/04/2014  . Essential hypertension, benign 08/13/2013  . Hyperlipidemia 08/13/2013  . History of kidney stones 08/13/2013  . Elevated liver enzymes 08/13/2013  . Vulvar cancer ([place]) 04/12/2013       Geraldine Solar PT, [place] [address] [place], [place], [address] Phone: [phone]   Fax:  [phone]  Name: Christie [name] MRN: [id number] Date of Birth: 1946-02-28

## 2017-09-29 ENCOUNTER — Encounter (HOSPITAL_COMMUNITY): Payer: Medicare Other

## 2017-10-04 ENCOUNTER — Ambulatory Visit (HOSPITAL_COMMUNITY): Payer: Medicare Other

## 2017-10-04 ENCOUNTER — Encounter (HOSPITAL_COMMUNITY): Payer: Self-pay

## 2017-10-04 DIAGNOSIS — R29898 Other symptoms and signs involving the musculoskeletal system: Secondary | ICD-10-CM

## 2017-10-04 DIAGNOSIS — M545 Low back pain: Secondary | ICD-10-CM

## 2017-10-04 DIAGNOSIS — M6281 Muscle weakness (generalized): Secondary | ICD-10-CM

## 2017-10-04 DIAGNOSIS — G8929 Other chronic pain: Secondary | ICD-10-CM

## 2017-10-04 DIAGNOSIS — M79604 Pain in right leg: Secondary | ICD-10-CM

## 2017-10-04 NOTE — Therapy (Signed)
[place], [place], [address] Phone: [phone]   Fax:  [phone]  Physical Therapy Treatment/Re-Assessment  Patient Details  Name: Christie [name] MRN: [id number] Date of Birth: 1945/12/23 Referring Provider: Sallee Lange, MD  Encounter Date: 10/04/2017      PT End of Session - 10/04/17 1608    Visit Number 6   Number of Visits 13   Date for PT Re-Evaluation 10/04/17   Authorization Type [employer] Medicare   Authorization Time Period 09/13/17 to 10/25/17   Authorization - Visit Number 6   Authorization - Number of Visits 10   PT Start Time 4627   PT Stop Time 1600   PT Time Calculation (min) 45 min   Activity Tolerance Patient tolerated treatment well   Behavior During Therapy [facility] for tasks assessed/performed      Past Medical History:  Diagnosis Date  . Cancer [facility]) 1999   external vulva lesion  . Depression   . DM (diabetes mellitus) ([place]) 2012   oral medications  . Elevated liver enzymes   . History of kidney stones   . Hyperlipidemia   . Hypertension 2000  . Spastic colon     Past Surgical History:  Procedure Laterality Date  . ANTERIOR LAT LUMBAR FUSION Right 07/04/2017   Procedure: Right LUMBAR TWO-THREE LUMBAR THREE-FOUR LUMBAR FOUR-FIVE Anterior lateral lumbar interbody fusion with percutaneous pedicle screws;  Surgeon: Erline Levine, MD;  Location: [place];  Service: Neurosurgery;  Laterality: Right;  Right lateral approach   . ANTERIOR LATERAL LUMBAR FUSION WITH PERCUTANEOUS SCREW 3 LEVEL N/A 07/04/2017   Procedure: PERCUTANEOUS SCREW THREE LEVEL LUMBAR TWO-THREE LUMBAR THREE-FOUR LUMBAR FOUR-FIVE;  Surgeon: Erline Levine, MD;  Location: [place];  Service: Neurosurgery;  Laterality: N/A;  Prone  . BACK SURGERY  2004 and 2017  . CHOLECYSTECTOMY    . COLONOSCOPY  feb 2005  . COLONOSCOPY N/A 02/13/2014   Procedure: COLONOSCOPY;  Surgeon: Rogene Houston, MD;  [facility];  Service: Endoscopy;   Laterality: N/A;  930-moved to [address] notified pt  . ESOPHAGOGASTRODUODENOSCOPY  09/2005  . EYE SURGERY Right    catatract removed  . EYE SURGERY Right    macular   surgery   . KIDNEY STONE SURGERY     for a staghorn kidney stone  . STAPEDECTOMY Bilateral    Right 1991 and Left 1994  . TOTAL ABDOMINAL HYSTERECTOMY  1982  . vulva cancer surgery  1999    There were no vitals filed for this visit.      Subjective Assessment - 10/04/17 1514    Subjective Patient notes that having more low back pain today but does not recall anything in particular that could've caused increased irritation. She notes that she has continued pain into Rt thigh from spasms. Notes that her radiating pain is more medial thigh into knee and is getting relief after treatment about a day or two.    Currently in Pain? Yes   Pain Score 5    Pain Location Back   Pain Orientation Right;Lower   Pain Descriptors / Indicators Aching;Spasm   Pain Type Chronic pain            [facility] PT Assessment - 10/04/17 0001      Strength   Right Hip Flexion 4+/5   Left Hip Flexion 4+/5   Right Knee Flexion 4+/5   Right Knee Extension 5/5   Left Knee Flexion 5/5   Left  Knee Extension 5/5   Right Ankle Dorsiflexion 4+/5   Left Ankle Dorsiflexion 4+/5     Palpation   Palpation comment Tenderness with tension Rt. glutes/piriformis, hip adductors, quad distal     Static Standing Balance   Static Standing Balance -  Activities  Single Leg Stance - Right Leg;Single Leg Stance - Left Leg   Static Standing - Comment/# of Minutes R and Lt. 3 seconds                     [facility] Adult PT Treatment/Exercise - 10/04/17 0001      Lumbar Exercises: Supine   Bent Knee Raise 10 reps   Bent Knee Raise Limitations 2 sets, with pulldowns with purple band   Bridge 10 reps   Bridge Limitations 2 sets, feet on BOSU     Lumbar Exercises: Sidelying   Clam 10 reps   Clam Limitations BLE, 2 sets, RTB  progress next  session   Hip Abduction 10 reps   Hip Abduction Limitations 2 sets, BLE     Lumbar Exercises: Prone   Straight Leg Raise 10 reps   Straight Leg Raises Limitations 2 sets, BLE, RLE knee bent     Manual Therapy   Manual Therapy Soft tissue mobilization;Myofascial release   Manual therapy comments completed separate rest of treatment   Soft tissue mobilization instrument assisted STM to R adductors, VMO, VLO, ITB   Myofascial Release MFR to R iliopsoas                  PT Short Term Goals - 10/04/17 1726      PT SHORT TERM GOAL #1   Title Pt will be independent with HEP and perform consistently to maximzie return to PLOF.   Baseline Notes she does them almost every day at least once a day   Time 3   Period Weeks   Status On-going     PT SHORT TERM GOAL #2   Title Pt will have improved MMT of all muscle groups tested of at least 1/2 grade to decrease pain and maximize gait and balance.    Baseline No pain noted, however, continue mm weakness proximal hip mm    Time 3   Period Weeks   Status On-going     PT SHORT TERM GOAL #3   Title Pt will be able to perform SLS on BLE for at least 10 sec with min to no unsteadiness to maximize gait on uneven ground.   Baseline SLS Rt. 3 sec, Lt. 5 sec - 3 trials each.    Time 3   Period Weeks   Status On-going           PT Long Term Goals - 09/13/17 1634      PT LONG TERM GOAL #1   Title Pt will have improved MMT of all muscle groups tested by at least 1 grade to maximze gait and her ability to ambulate stairs with confidence.   Time 6   Period Weeks   Status New   Target Date 10/25/17     PT LONG TERM GOAL #2   Title Pt will have an improved tolerance to standing and walking by at least 50% (at least 30 mins for standing and 1 hour for walking) without exacerbations in RLE pain to maximize her ability to perform chores at home.    Time 6   Period Weeks   Status New     PT LONG  TERM GOAL #3   Title Pt will be able  to perform 3MWT maintaining at least 1.28m/s gait speed and without exacerbations in RLE pain to demo improved tolerance to ambulation and maximize her function in the community.    Time 6   Period Weeks   Status New               Plan - 10/04/17 1609    Clinical Impression Statement Today's session focused on assessment of patient's progress so far. She reports being consistent with her home program almost once a day every day, some days she notes skipping. She has made slight improvement in overall Rt LE strength, however, she presented with decreased hip extension in prone, and compensatory hip flexion when testing hip abduction in side-lying. Patient encouraged to challenge herself standing on one leg at home secondary to continued difficulty in session. Overall, patient has made good functional improvements noting lateral leg pain has decreased, medial thigh pain is only real irritation. She also noted that she is able to walk a little longer before feeling the pain in her medial Rt. Thigh. Continued tension noted with STM this session, with decreased towards EOS.    Rehab Potential Fair   PT Frequency 2x / week   PT Duration 6 weeks   PT Treatment/Interventions ADLs/Self Care Home Management;Aquatic Therapy;Cryotherapy;Electrical Stimulation;Moist Heat;DME Instruction;Gait training;Stair training;Functional mobility training;Therapeutic activities;Therapeutic exercise;Balance training;Neuromuscular re-education;Patient/family education;Manual techniques;Passive range of motion;Dry needling;Taping   PT Next Visit Plan continue prone quad stretch and SLS with pulldowns; continue glute and core strengthening; continue manual to glutes, VLO, and VMO(lumbar paraspinals PRN); continue MFR to R iliopsoas; add heel taps   PT Home Exercise Plan eval: sit <> stands, standing hip abd with RTB; 9/28: bridging with ankle DF, clams with RTB; 10/5: sidestep with RTB, supine frog stretch   Consulted and  Agree with Plan of Care Patient      Patient will benefit from skilled therapeutic intervention in order to improve the following deficits and impairments:  Abnormal gait, Decreased activity tolerance, Decreased balance, Decreased endurance, Decreased mobility, Decreased range of motion, Decreased strength, Difficulty walking, Increased muscle spasms, Improper body mechanics, Impaired flexibility, Postural dysfunction, Pain  Visit Diagnosis: Pain in right leg  Muscle weakness (generalized)  Other symptoms and signs involving the musculoskeletal system  Chronic bilateral low back pain without sciatica     Problem List Patient Active Problem List   Diagnosis Date Noted  . Lumbar spine scoliosis 07/04/2017  . Aortic stenosis, mild 11/14/2016  . Elevated alkaline phosphatase level 05/17/2015  . Urethral diverticulum 10/14/2014  . Lichen sclerosus et atrophicus 10/14/2014  . Poorly controlled type 2 diabetes mellitus ([place]) 09/29/2014  . Insomnia 09/29/2014  . Other malaise and fatigue 04/04/2014  . Depression 04/04/2014  . Essential hypertension, benign 08/13/2013  . Hyperlipidemia 08/13/2013  . History of kidney stones 08/13/2013  . Elevated liver enzymes 08/13/2013  . Vulvar cancer ([place]) 04/12/2013   Starr Lake PT, DPT 5:28 PM, 10/04/17 [place], [place], [address] Phone: [phone]   Fax:  [phone]  Name: Christie [name] MRN: [id number] Date of Birth: 1946-12-11

## 2017-10-06 ENCOUNTER — Encounter (HOSPITAL_COMMUNITY): Payer: Self-pay

## 2017-10-06 ENCOUNTER — Ambulatory Visit (HOSPITAL_COMMUNITY): Payer: Medicare Other

## 2017-10-06 DIAGNOSIS — R29898 Other symptoms and signs involving the musculoskeletal system: Secondary | ICD-10-CM

## 2017-10-06 DIAGNOSIS — M79604 Pain in right leg: Secondary | ICD-10-CM

## 2017-10-06 DIAGNOSIS — M6281 Muscle weakness (generalized): Secondary | ICD-10-CM

## 2017-10-06 DIAGNOSIS — G8929 Other chronic pain: Secondary | ICD-10-CM

## 2017-10-06 DIAGNOSIS — M545 Low back pain: Secondary | ICD-10-CM

## 2017-10-06 NOTE — Therapy (Signed)
[place], [place], [address] Phone: [phone]   Fax:  [phone]  Physical Therapy Treatment  Patient Details  Name: Christie [name] MRN: [id number] Date of Birth: Nov 24, 1946 Referring Provider: Sallee Lange, MD  Encounter Date: 10/06/2017      PT End of Session - 10/06/17 1523    Visit Number 7   Number of Visits 13   Date for PT Re-Evaluation 10/04/17   Authorization Type [employer] Medicare   Authorization Time Period 09/13/17 to 10/25/17   Authorization - Visit Number 7   Authorization - Number of Visits 10   PT Start Time 1520   PT Stop Time 1602   PT Time Calculation (min) 42 min   Activity Tolerance Patient tolerated treatment well   Behavior During Therapy [facility] for tasks assessed/performed      Past Medical History:  Diagnosis Date  . Cancer [facility]) 1999   external vulva lesion  . Depression   . DM (diabetes mellitus) ([place]) 2012   oral medications  . Elevated liver enzymes   . History of kidney stones   . Hyperlipidemia   . Hypertension 2000  . Spastic colon     Past Surgical History:  Procedure Laterality Date  . ANTERIOR LAT LUMBAR FUSION Right 07/04/2017   Procedure: Right LUMBAR TWO-THREE LUMBAR THREE-FOUR LUMBAR FOUR-FIVE Anterior lateral lumbar interbody fusion with percutaneous pedicle screws;  Surgeon: Erline Levine, MD;  Location: [place];  Service: Neurosurgery;  Laterality: Right;  Right lateral approach   . ANTERIOR LATERAL LUMBAR FUSION WITH PERCUTANEOUS SCREW 3 LEVEL N/A 07/04/2017   Procedure: PERCUTANEOUS SCREW THREE LEVEL LUMBAR TWO-THREE LUMBAR THREE-FOUR LUMBAR FOUR-FIVE;  Surgeon: Erline Levine, MD;  Location: [place];  Service: Neurosurgery;  Laterality: N/A;  Prone  . BACK SURGERY  2004 and 2017  . CHOLECYSTECTOMY    . COLONOSCOPY  feb 2005  . COLONOSCOPY N/A 02/13/2014   Procedure: COLONOSCOPY;  Surgeon: Rogene Houston, MD;  [facility];  Service: Endoscopy;  Laterality:  N/A;  930-moved to [address] notified pt  . ESOPHAGOGASTRODUODENOSCOPY  09/2005  . EYE SURGERY Right    catatract removed  . EYE SURGERY Right    macular   surgery   . KIDNEY STONE SURGERY     for a staghorn kidney stone  . STAPEDECTOMY Bilateral    Right 1991 and Left 1994  . TOTAL ABDOMINAL HYSTERECTOMY  1982  . vulva cancer surgery  1999    There were no vitals filed for this visit.      Subjective Assessment - 10/06/17 1524    Subjective Pt states that she does not feel good today, she did not sleep good.    Currently in Pain? Yes   Pain Score 4    Pain Location Back   Pain Orientation Lower   Pain Descriptors / Indicators Sore;Aching   Pain Type Chronic pain   Pain Onset More than a month ago   Pain Frequency Constant   Aggravating Factors  sitting, standing, walking too long   Pain Relieving Factors pain meds   Effect of Pain on Daily Activities increases              [facility] Adult PT Treatment/Exercise - 10/06/17 0001      Lumbar Exercises: Stretches   Standing Side Bend Limitations R adductor stretch in standing 3x30    Quadruped Mid Back Stretch Limitations thomas test stretch 3x30 sec  Lumbar Exercises: Standing   Wall Slides 10 reps   Other Standing Lumbar Exercises 4-way resisted walking 75ft x1RT each with purple band; heel taps on 4" step 2x10   Other Standing Lumbar Exercises goblet squats x10 reps with 5# DB; SLS on foam 5x10" each; bil tandem stance on foam and palov press with RTB x15 reps each     Manual Therapy   Manual Therapy Myofascial release   Manual therapy comments completed separate rest of treatment   Myofascial Release MFR to R glute med with pt in prone                PT Education - 10/06/17 1523    Education provided Yes          PT Short Term Goals - 10/04/17 1726      PT SHORT TERM GOAL #1   Title Pt will be independent with HEP and perform consistently to maximzie return to PLOF.   Baseline Notes she does  them almost every day at least once a day   Time 3   Period Weeks   Status On-going     PT SHORT TERM GOAL #2   Title Pt will have improved MMT of all muscle groups tested of at least 1/2 grade to decrease pain and maximize gait and balance.    Baseline No pain noted, however, continue mm weakness proximal hip mm    Time 3   Period Weeks   Status On-going     PT SHORT TERM GOAL #3   Title Pt will be able to perform SLS on BLE for at least 10 sec with min to no unsteadiness to maximize gait on uneven ground.   Baseline SLS Rt. 3 sec, Lt. 5 sec - 3 trials each.    Time 3   Period Weeks   Status On-going           PT Long Term Goals - 09/13/17 1634      PT LONG TERM GOAL #1   Title Pt will have improved MMT of all muscle groups tested by at least 1 grade to maximze gait and her ability to ambulate stairs with confidence.   Time 6   Period Weeks   Status New   Target Date 10/25/17     PT LONG TERM GOAL #2   Title Pt will have an improved tolerance to standing and walking by at least 50% (at least 30 mins for standing and 1 hour for walking) without exacerbations in RLE pain to maximize her ability to perform chores at home.    Time 6   Period Weeks   Status New     PT LONG TERM GOAL #3   Title Pt will be able to perform 3MWT maintaining at least 1.9m/s gait speed and without exacerbations in RLE pain to demo improved tolerance to ambulation and maximize her function in the community.    Time 6   Period Weeks   Status New               Plan - 10/06/17 1606    Clinical Impression Statement Session focused on improving flexibility, gluteal strengthening, functional strengthening, balance and addressing soft tissue restrictions. Pt with noticeable restrictions in R glute med which were tender to palpation/MFR. Pt reported soreness in R hip where manual had been performed but no pain at EOS. Modified adductor stretch to be performed in standing.   Rehab Potential Fair    PT Frequency 2x /  week   PT Duration 6 weeks   PT Treatment/Interventions ADLs/Self Care Home Management;Aquatic Therapy;Cryotherapy;Electrical Stimulation;Moist Heat;DME Instruction;Gait training;Stair training;Functional mobility training;Therapeutic activities;Therapeutic exercise;Balance training;Neuromuscular re-education;Patient/family education;Manual techniques;Passive range of motion;Dry needling;Taping   PT Next Visit Plan continue glute and core strengthening; continue manual to glutes, VLO, and VMO(lumbar paraspinals PRN); continue MFR to R iliopsoas; continue balance training, add deadlifts from elevated surface   PT Home Exercise Plan eval: sit <> stands, standing hip abd with RTB; 9/28: bridging with ankle DF, clams with RTB; 10/5: sidestep with RTB; 10/19; modified adductor stretch to standing   Consulted and Agree with Plan of Care Patient      Patient will benefit from skilled therapeutic intervention in order to improve the following deficits and impairments:  Abnormal gait, Decreased activity tolerance, Decreased balance, Decreased endurance, Decreased mobility, Decreased range of motion, Decreased strength, Difficulty walking, Increased muscle spasms, Improper body mechanics, Impaired flexibility, Postural dysfunction, Pain  Visit Diagnosis: Pain in right leg  Muscle weakness (generalized)  Other symptoms and signs involving the musculoskeletal system  Chronic bilateral low back pain without sciatica     Problem List Patient Active Problem List   Diagnosis Date Noted  . Lumbar spine scoliosis 07/04/2017  . Aortic stenosis, mild 11/14/2016  . Elevated alkaline phosphatase level 05/17/2015  . Urethral diverticulum 10/14/2014  . Lichen sclerosus et atrophicus 10/14/2014  . Poorly controlled type 2 diabetes mellitus ([place]) 09/29/2014  . Insomnia 09/29/2014  . Other malaise and fatigue 04/04/2014  . Depression 04/04/2014  . Essential hypertension, benign  08/13/2013  . Hyperlipidemia 08/13/2013  . History of kidney stones 08/13/2013  . Elevated liver enzymes 08/13/2013  . Vulvar cancer ([place]) 04/12/2013        Geraldine Solar PT, [place] [address] [place], [place], [address] Phone: [phone]   Fax:  [phone]  Name: VIOLETT HOBBS MRN: [id number] Date of Birth: 12-16-1946

## 2017-10-08 IMAGING — DX DG LUMBAR SPINE COMPLETE 4+V
5 series · 5 of 5 positions shown · non-contrast
Comparison: AP and lateral views of the lumbar spine March 02, 2016

CLINICAL DATA: Spinal stenosis with neurogenic claudication. Low
back pain radiating into both legs for the past 6 weeks. No known
injury. History of previous back surgery.

EXAM:
LUMBAR SPINE - COMPLETE 4+ VIEW

[l-spine ap]
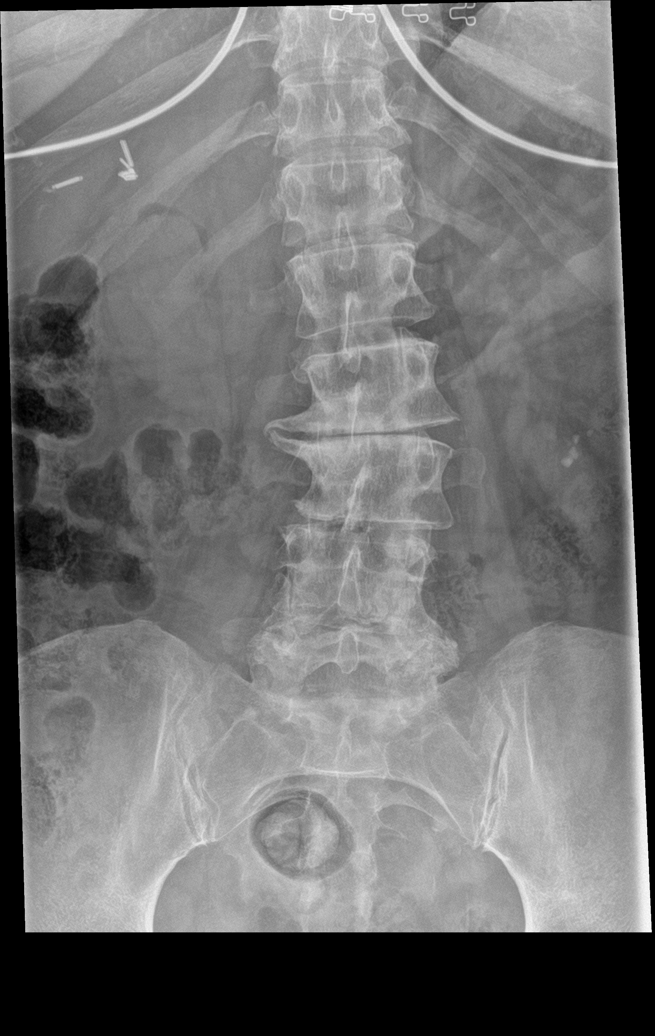

[l-spine obl (1 of 2)]
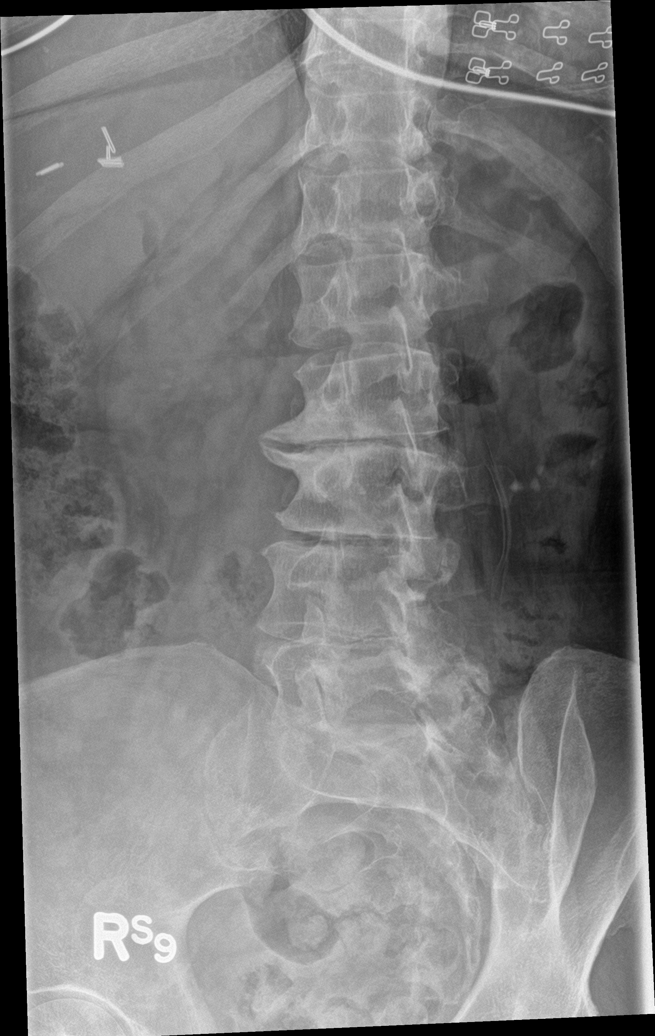

[l-spine obl (2 of 2)]
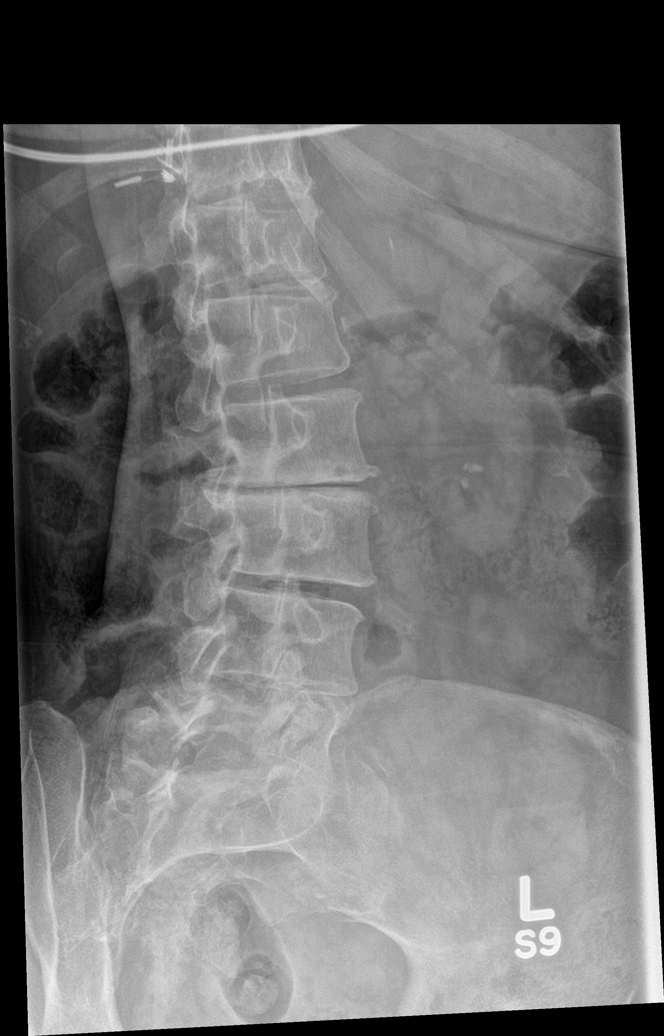

[l-spine lat]
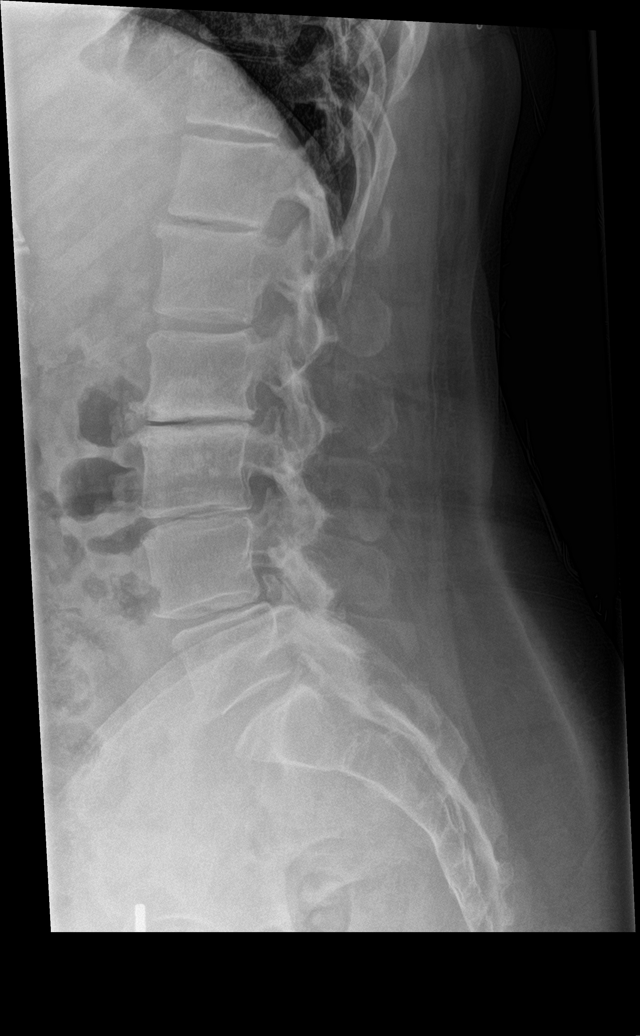

[l-spine spot]
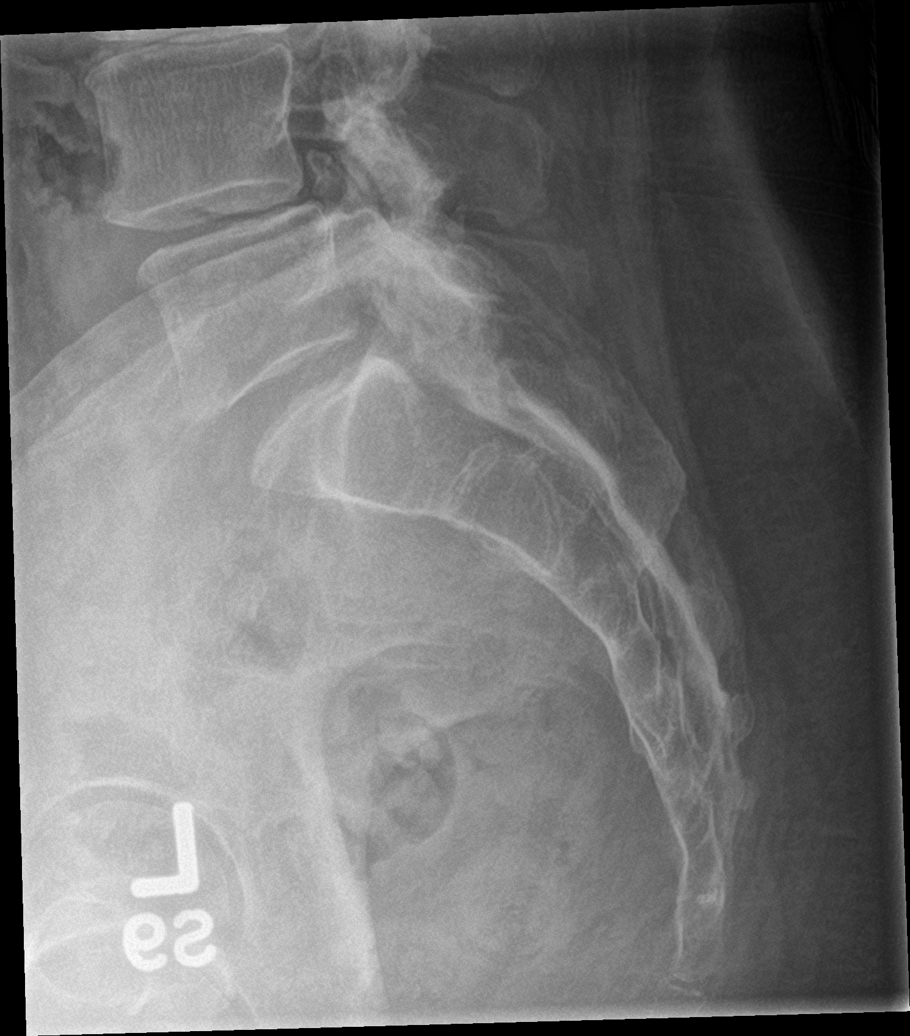

[5 of 5 positions shown; findings below may reference images not displayed]

FINDINGS: There is stable mild curvature convex toward the left centered at
L3. The vertebral bodies are preserved in height. There is moderate
disc space narrowing at T12-L1, L2-L3, L3-L4. There is mild
narrowing at L4-L5. There is stable minimal anterolisthesis of L4
with respect to L5. There is facet joint hypertrophy at L4-5 and at
L5-S1. The pedicles and transverse processes are intact where
visualized. There is a large right lateral endplate spur at L2-3.
The observed portions of the sacrum are normal. There are
calcifications which project over the lower pole of the left kidney
consistent with stones.
IMPRESSION: Multilevel degenerative disc and facet joint change of the lumbar
spine. No compression fracture. Stable minimal grade 1
anterolisthesis of L4 with respect L5.

## 2017-10-11 ENCOUNTER — Ambulatory Visit (HOSPITAL_COMMUNITY): Payer: Medicare Other

## 2017-10-11 ENCOUNTER — Encounter (HOSPITAL_COMMUNITY): Payer: Self-pay

## 2017-10-11 DIAGNOSIS — M545 Low back pain: Secondary | ICD-10-CM

## 2017-10-11 DIAGNOSIS — M79604 Pain in right leg: Secondary | ICD-10-CM

## 2017-10-11 DIAGNOSIS — R29898 Other symptoms and signs involving the musculoskeletal system: Secondary | ICD-10-CM

## 2017-10-11 DIAGNOSIS — G8929 Other chronic pain: Secondary | ICD-10-CM

## 2017-10-11 DIAGNOSIS — M6281 Muscle weakness (generalized): Secondary | ICD-10-CM

## 2017-10-11 NOTE — Therapy (Signed)
[place], [place], [address] Phone: [phone]   Fax:  [phone]  Physical Therapy Treatment  Patient Details  Name: Christie [name] MRN: [id number] Date of Birth: 11-30-1946 Referring Provider: Sallee Lange, MD  Encounter Date: 10/11/2017      PT End of Session - 10/11/17 1524    Visit Number 8   Number of Visits 13   Date for PT Re-Evaluation 10/25/17   Authorization Type [employer] Medicare   Authorization Time Period 09/13/17 to 10/25/17   Authorization - Visit Number 8   Authorization - Number of Visits 10   PT Start Time 1520   PT Stop Time 1602   PT Time Calculation (min) 42 min   Activity Tolerance Patient tolerated treatment well   Behavior During Therapy [facility] for tasks assessed/performed      Past Medical History:  Diagnosis Date  . Cancer [facility]) 1999   external vulva lesion  . Depression   . DM (diabetes mellitus) ([place]) 2012   oral medications  . Elevated liver enzymes   . History of kidney stones   . Hyperlipidemia   . Hypertension 2000  . Spastic colon     Past Surgical History:  Procedure Laterality Date  . ANTERIOR LAT LUMBAR FUSION Right 07/04/2017   Procedure: Right LUMBAR TWO-THREE LUMBAR THREE-FOUR LUMBAR FOUR-FIVE Anterior lateral lumbar interbody fusion with percutaneous pedicle screws;  Surgeon: Erline Levine, MD;  Location: [place];  Service: Neurosurgery;  Laterality: Right;  Right lateral approach   . ANTERIOR LATERAL LUMBAR FUSION WITH PERCUTANEOUS SCREW 3 LEVEL N/A 07/04/2017   Procedure: PERCUTANEOUS SCREW THREE LEVEL LUMBAR TWO-THREE LUMBAR THREE-FOUR LUMBAR FOUR-FIVE;  Surgeon: Erline Levine, MD;  Location: [place];  Service: Neurosurgery;  Laterality: N/A;  Prone  . BACK SURGERY  2004 and 2017  . CHOLECYSTECTOMY    . COLONOSCOPY  feb 2005  . COLONOSCOPY N/A 02/13/2014   Procedure: COLONOSCOPY;  Surgeon: Rogene Houston, MD;  [facility];  Service: Endoscopy;  Laterality:  N/A;  930-moved to [address] notified pt  . ESOPHAGOGASTRODUODENOSCOPY  09/2005  . EYE SURGERY Right    catatract removed  . EYE SURGERY Right    macular   surgery   . KIDNEY STONE SURGERY     for a staghorn kidney stone  . STAPEDECTOMY Bilateral    Right 1991 and Left 1994  . TOTAL ABDOMINAL HYSTERECTOMY  1982  . vulva cancer surgery  1999    There were no vitals filed for this visit.      Subjective Assessment - 10/11/17 1521    Subjective Pt states that her lower back is hurting her a little bit today.    Currently in Pain? Yes   Pain Score 4    Pain Location Back   Pain Orientation Lower   Pain Descriptors / Indicators --  hurts   Pain Type Chronic pain   Pain Onset More than a month ago   Pain Frequency Constant   Aggravating Factors  sitting, standing, walking too long   Pain Relieving Factors pain meds   Effect of Pain on Daily Activities increases              [facility] Adult PT Treatment/Exercise - 10/11/17 0001      Lumbar Exercises: Stretches   Single Knee to Chest Stretch 2 reps;30 seconds   Single Knee to Chest Stretch Limitations BLE   Double Knee to Chest Stretch 2 reps;30  seconds   Piriformis Stretch 1 rep;10 seconds   Piriformis Stretch Limitations after manual     Lumbar Exercises: Standing   Other Standing Lumbar Exercises 4-way resisted walking 44ft x1RT each with purple band; deadlifts from 8" step with 10# DB 2x10     Lumbar Exercises: Supine   Bridge Limitations bil SL bridging with ankle DF x10 reps each   Straight Leg Raise 10 reps   Straight Leg Raises Limitations 2 sets BLE with diaphragmatic breathing     Lumbar Exercises: Sidelying   Hip Abduction 15 reps   Hip Abduction Weights (lbs) 2   Hip Abduction Limitations BLE     Manual Therapy   Manual Therapy Myofascial release   Manual therapy comments completed separate rest of treatment   Myofascial Release MFR to R glute med with pt in prone             PT Education -  10/11/17 1525    Education provided Yes   Education Details importance of HEP adherence, updated HEP   Person(s) Educated Patient   Methods Explanation;Demonstration;Verbal cues   Comprehension Verbalized understanding;Returned demonstration          PT Short Term Goals - 10/04/17 1726      PT SHORT TERM GOAL #1   Title Pt will be independent with HEP and perform consistently to maximzie return to PLOF.   Baseline Notes she does them almost every day at least once a day   Time 3   Period Weeks   Status On-going     PT SHORT TERM GOAL #2   Title Pt will have improved MMT of all muscle groups tested of at least 1/2 grade to decrease pain and maximize gait and balance.    Baseline No pain noted, however, continue mm weakness proximal hip mm    Time 3   Period Weeks   Status On-going     PT SHORT TERM GOAL #3   Title Pt will be able to perform SLS on BLE for at least 10 sec with min to no unsteadiness to maximize gait on uneven ground.   Baseline SLS Rt. 3 sec, Lt. 5 sec - 3 trials each.    Time 3   Period Weeks   Status On-going           PT Long Term Goals - 09/13/17 1634      PT LONG TERM GOAL #1   Title Pt will have improved MMT of all muscle groups tested by at least 1 grade to maximze gait and her ability to ambulate stairs with confidence.   Time 6   Period Weeks   Status New   Target Date 10/25/17     PT LONG TERM GOAL #2   Title Pt will have an improved tolerance to standing and walking by at least 50% (at least 30 mins for standing and 1 hour for walking) without exacerbations in RLE pain to maximize her ability to perform chores at home.    Time 6   Period Weeks   Status New     PT LONG TERM GOAL #3   Title Pt will be able to perform 3MWT maintaining at least 1.3m/s gait speed and without exacerbations in RLE pain to demo improved tolerance to ambulation and maximize her function in the community.    Time 6   Period Weeks   Status New  Plan - 10/11/17 1609    Clinical Impression Statement Pt presented to therapy with slightly increased LBP this date. Session focused on core and glute strenghtening. Introduced pt to modified deadlifting which she did very well with. Ended with MFR to R glute med and again palpable knots noted and increased pain to palpation. Following manual, had pt perform R piriformis/glute med stretch and she reported that it provided some relief. 0/10 pain at EOS.   Rehab Potential Fair   PT Frequency 2x / week   PT Duration 6 weeks   PT Treatment/Interventions ADLs/Self Care Home Management;Aquatic Therapy;Cryotherapy;Electrical Stimulation;Moist Heat;DME Instruction;Gait training;Stair training;Functional mobility training;Therapeutic activities;Therapeutic exercise;Balance training;Neuromuscular re-education;Patient/family education;Manual techniques;Passive range of motion;Dry needling;Taping   PT Next Visit Plan continue glute and core strengthening; continue manual to glutes, VLO, and VMO(lumbar paraspinals PRN); continue MFR to R iliopsoas; continue balance training, deadlifts from elevated surface   PT Home Exercise Plan eval: sit <> stands, standing hip abd with RTB; 9/28: bridging with ankle DF, clams with RTB; 10/5: sidestep with RTB; 10/19; modified adductor stretch to standing; 10/24: SLR, s/l hip abd, piriformis stretch   Consulted and Agree with Plan of Care Patient      Patient will benefit from skilled therapeutic intervention in order to improve the following deficits and impairments:  Abnormal gait, Decreased activity tolerance, Decreased balance, Decreased endurance, Decreased mobility, Decreased range of motion, Decreased strength, Difficulty walking, Increased muscle spasms, Improper body mechanics, Impaired flexibility, Postural dysfunction, Pain  Visit Diagnosis: Pain in right leg  Muscle weakness (generalized)  Other symptoms and signs involving the  musculoskeletal system  Chronic bilateral low back pain without sciatica     Problem List Patient Active Problem List   Diagnosis Date Noted  . Lumbar spine scoliosis 07/04/2017  . Aortic stenosis, mild 11/14/2016  . Elevated alkaline phosphatase level 05/17/2015  . Urethral diverticulum 10/14/2014  . Lichen sclerosus et atrophicus 10/14/2014  . Poorly controlled type 2 diabetes mellitus ([place]) 09/29/2014  . Insomnia 09/29/2014  . Other malaise and fatigue 04/04/2014  . Depression 04/04/2014  . Essential hypertension, benign 08/13/2013  . Hyperlipidemia 08/13/2013  . History of kidney stones 08/13/2013  . Elevated liver enzymes 08/13/2013  . Vulvar cancer ([place]) 04/12/2013        Geraldine Solar PT, [place] [address] [place], [place], [address] Phone: [phone]   Fax:  [phone]  Name: Christie [name] MRN: [id number] Date of Birth: 03-Dec-1946

## 2017-10-11 NOTE — Patient Instructions (Signed)
  STRAIGHT LEG RAISE - SLR  While lying on your back, raise up your leg with a straight knee.  Keep the opposite knee bent with the foot planted on the ground.  Perform 1x/day, 2-3 sets of 10-15 reps on each leg    HIP ABDUCTION - SIDELYING  While lying on your side, slowly raise up your top leg to the side. Keep your knee straight and maintain your toes pointed forward the entire time. Keep your leg in-line with your body.  The bottom leg can be bent to stabilize your body.  Perform 1x/day, 2-3 sets of 10-15 reps on each leg   PIRIFORMIS STRETCH - MODIFIED  While lying on your back, hold your knee with your opposite hand and draw your knee up and over towards your opposite shoulder.  Perform 1-2x/day, 3-5 stretches holding for 30-60 seconds

## 2017-10-13 ENCOUNTER — Ambulatory Visit (HOSPITAL_COMMUNITY): Payer: Medicare Other

## 2017-10-13 DIAGNOSIS — G8929 Other chronic pain: Secondary | ICD-10-CM

## 2017-10-13 DIAGNOSIS — M79604 Pain in right leg: Secondary | ICD-10-CM

## 2017-10-13 DIAGNOSIS — M545 Low back pain: Secondary | ICD-10-CM

## 2017-10-13 DIAGNOSIS — M6281 Muscle weakness (generalized): Secondary | ICD-10-CM

## 2017-10-13 DIAGNOSIS — R29898 Other symptoms and signs involving the musculoskeletal system: Secondary | ICD-10-CM

## 2017-10-13 NOTE — Patient Instructions (Addendum)
  SINGLE LEG STANCE - SLS  Stand on one leg and maintain your balance.  Then, kick opposite leg forward, sideways, and then backwards to challenge balance -- hold for 3-5 seconds at each spot    Side note:  1. Stretch every day (pulling knee to chest, pulling knee to opposite shoulder, standing at kitchen counter and leaning left, etc.) 2. Pick 3-4 of the other exercises and do those one day, then do the other 3-4 the next day.

## 2017-10-13 NOTE — Therapy (Signed)
[place], [place], [address] Phone: [phone]   Fax:  [phone]  Physical Therapy Treatment  Patient Details  Name: Christie [name] MRN: [id number] Date of Birth: Apr 08, 1946 Referring Provider: Sallee Lange, MD  Encounter Date: 10/13/2017      PT End of Session - 10/13/17 1522    Visit Number 9   Number of Visits 13   Date for PT Re-Evaluation 10/25/17   Authorization Type UHC Medicare   Authorization Time Period 09/13/17 to 10/25/17   Authorization - Visit Number 9   Authorization - Number of Visits 10   PT Start Time 1324   PT Stop Time 1602   PT Time Calculation (min) 46 min   Activity Tolerance Patient tolerated treatment well   Behavior During Therapy [facility] for tasks assessed/performed      Past Medical History:  Diagnosis Date  . Cancer [facility]) 1999   external vulva lesion  . Depression   . DM (diabetes mellitus) ([place]) 2012   oral medications  . Elevated liver enzymes   . History of kidney stones   . Hyperlipidemia   . Hypertension 2000  . Spastic colon     Past Surgical History:  Procedure Laterality Date  . ANTERIOR LAT LUMBAR FUSION Right 07/04/2017   Procedure: Right LUMBAR TWO-THREE LUMBAR THREE-FOUR LUMBAR FOUR-FIVE Anterior lateral lumbar interbody fusion with percutaneous pedicle screws;  Surgeon: Erline Levine, MD;  Location: [place];  Service: Neurosurgery;  Laterality: Right;  Right lateral approach   . ANTERIOR LATERAL LUMBAR FUSION WITH PERCUTANEOUS SCREW 3 LEVEL N/A 07/04/2017   Procedure: PERCUTANEOUS SCREW THREE LEVEL LUMBAR TWO-THREE LUMBAR THREE-FOUR LUMBAR FOUR-FIVE;  Surgeon: Erline Levine, MD;  Location: [place];  Service: Neurosurgery;  Laterality: N/A;  Prone  . BACK SURGERY  2004 and 2017  . CHOLECYSTECTOMY    . COLONOSCOPY  feb 2005  . COLONOSCOPY N/A 02/13/2014   Procedure: COLONOSCOPY;  Surgeon: Rogene Houston, MD;  [facility];  Service: Endoscopy;  Laterality:  N/A;  930-moved to [address] notified pt  . ESOPHAGOGASTRODUODENOSCOPY  09/2005  . EYE SURGERY Right    catatract removed  . EYE SURGERY Right    macular   surgery   . KIDNEY STONE SURGERY     for a staghorn kidney stone  . STAPEDECTOMY Bilateral    Right 1991 and Left 1994  . TOTAL ABDOMINAL HYSTERECTOMY  1982  . vulva cancer surgery  1999    There were no vitals filed for this visit.      Subjective Assessment - 10/13/17 1522    Subjective Pt states that she is stiff this afternoon due to the rain.    Currently in Pain? Yes   Pain Score 3    Pain Location Back   Pain Orientation Lower   Pain Descriptors / Indicators Tightness   Pain Type Chronic pain   Pain Onset More than a month ago   Pain Frequency Constant   Aggravating Factors  sitting, standing, walking too long   Pain Relieving Factors pain meds   Effect of Pain on Daily Activities increases               [facility] Adult PT Treatment/Exercise - 10/13/17 0001      Lumbar Exercises: Stretches   Single Knee to Chest Stretch 2 reps;30 seconds   Single Knee to Chest Stretch Limitations BLE   Double Knee to Chest Stretch 2 reps;30 seconds  Piriformis Stretch 2 reps;30 seconds     Lumbar Exercises: Standing   Shoulder Adduction Limitations bil SLS on foam 5x10" each   Other Standing Lumbar Exercises deadlifts from 6" step with 10# DB x15 reps   Other Standing Lumbar Exercises goblet squats with 5# DB 2x15     Lumbar Exercises: Supine   Straight Leg Raise 15 reps   Straight Leg Raises Limitations BLE with 2#   Other Supine Lumbar Exercises deadbugs with BTB UE resistance x15 reps each      Lumbar Exercises: Sidelying   Hip Abduction 15 reps   Hip Abduction Weights (lbs) 2   Hip Abduction Limitations BLE     Manual Therapy   Manual Therapy Myofascial release   Manual therapy comments completed separate rest of treatment   Myofascial Release MFR to R iliopsoas at insertion in supine; MFR to R glute med in  prone                PT Education - 10/13/17 1549    Education provided Yes   Education Details updated HEP, perform stretching every day, 3-4 strengthening exercises every other day    Person(s) Educated Patient   Methods Explanation;Demonstration;Handout   Comprehension Verbalized understanding;Returned demonstration          PT Short Term Goals - 10/04/17 1726      PT SHORT TERM GOAL #1   Title Pt will be independent with HEP and perform consistently to maximzie return to PLOF.   Baseline Notes she does them almost every day at least once a day   Time 3   Period Weeks   Status On-going     PT SHORT TERM GOAL #2   Title Pt will have improved MMT of all muscle groups tested of at least 1/2 grade to decrease pain and maximize gait and balance.    Baseline No pain noted, however, continue mm weakness proximal hip mm    Time 3   Period Weeks   Status On-going     PT SHORT TERM GOAL #3   Title Pt will be able to perform SLS on BLE for at least 10 sec with min to no unsteadiness to maximize gait on uneven ground.   Baseline SLS Rt. 3 sec, Lt. 5 sec - 3 trials each.    Time 3   Period Weeks   Status On-going           PT Long Term Goals - 09/13/17 1634      PT LONG TERM GOAL #1   Title Pt will have improved MMT of all muscle groups tested by at least 1 grade to maximze gait and her ability to ambulate stairs with confidence.   Time 6   Period Weeks   Status New   Target Date 10/25/17     PT LONG TERM GOAL #2   Title Pt will have an improved tolerance to standing and walking by at least 50% (at least 30 mins for standing and 1 hour for walking) without exacerbations in RLE pain to maximize her ability to perform chores at home.    Time 6   Period Weeks   Status New     PT LONG TERM GOAL #3   Title Pt will be able to perform 3MWT maintaining at least 1.31m/s gait speed and without exacerbations in RLE pain to demo improved tolerance to ambulation and maximize  her function in the community.    Time 6   Period  Weeks   Status New               Plan - 10/13/17 1613    Clinical Impression Statement Today's session focused on mobility, core stability, hip strengthening, and improving soft tissue restrictions. Pt still continues to c/o mild diffuse burning/tingling/numb feeling of anterior R thigh. Performed MFR to R iliopsoas at insertion and pt reported improved symptoms; MFR to R glute med continues to have palpable knot and tender to palpation, though it is improving. Pt 1-2/10 at EOS, and improvement from start.    Rehab Potential Fair   PT Frequency 2x / week   PT Duration 6 weeks   PT Treatment/Interventions ADLs/Self Care Home Management;Aquatic Therapy;Cryotherapy;Electrical Stimulation;Moist Heat;DME Instruction;Gait training;Stair training;Functional mobility training;Therapeutic activities;Therapeutic exercise;Balance training;Neuromuscular re-education;Patient/family education;Manual techniques;Passive range of motion;Dry needling;Taping   PT Next Visit Plan continue glute and core strengthening; continue manual to glutes, VLO, VMO and R iliopsoas; continue balance training, deadlifts from elevated surface; progress core stability   PT Home Exercise Plan eval: sit <> stands, standing hip abd with RTB; 9/28: bridging with ankle DF, clams with RTB; 10/5: sidestep with RTB; 10/19; modified adductor stretch to standing; 10/24: SLR, s/l hip abd, piriformis stretch; 10/26: SLS, SLS with vectors   Consulted and Agree with Plan of Care Patient      Patient will benefit from skilled therapeutic intervention in order to improve the following deficits and impairments:  Abnormal gait, Decreased activity tolerance, Decreased balance, Decreased endurance, Decreased mobility, Decreased range of motion, Decreased strength, Difficulty walking, Increased muscle spasms, Improper body mechanics, Impaired flexibility, Postural dysfunction, Pain  Visit  Diagnosis: Pain in right leg  Muscle weakness (generalized)  Other symptoms and signs involving the musculoskeletal system  Chronic bilateral low back pain without sciatica     Problem List Patient Active Problem List   Diagnosis Date Noted  . Lumbar spine scoliosis 07/04/2017  . Aortic stenosis, mild 11/14/2016  . Elevated alkaline phosphatase level 05/17/2015  . Urethral diverticulum 10/14/2014  . Lichen sclerosus et atrophicus 10/14/2014  . Poorly controlled type 2 diabetes mellitus ([place]) 09/29/2014  . Insomnia 09/29/2014  . Other malaise and fatigue 04/04/2014  . Depression 04/04/2014  . Essential hypertension, benign 08/13/2013  . Hyperlipidemia 08/13/2013  . History of kidney stones 08/13/2013  . Elevated liver enzymes 08/13/2013  . Vulvar cancer ([place]) 04/12/2013       Geraldine Solar PT, [place] [address] [place], [place], [address] Phone: [phone]   Fax:  [phone]  Name: Christie [name] MRN: [id number] Date of Birth: 1946-04-13

## 2017-10-17 ENCOUNTER — Ambulatory Visit (INDEPENDENT_AMBULATORY_CARE_PROVIDER_SITE_OTHER): Payer: Medicare Other | Admitting: Family Medicine

## 2017-10-17 ENCOUNTER — Encounter: Payer: Self-pay | Admitting: Family Medicine

## 2017-10-17 VITALS — BP 120/78 | Temp 98.6°F | Ht 63.5 in | Wt 172.4 lb

## 2017-10-17 DIAGNOSIS — I10 Essential (primary) hypertension: Secondary | ICD-10-CM | POA: Diagnosis not present

## 2017-10-17 MED ORDER — ZOSTER VAC RECOMB ADJUVANTED 50 MCG/0.5ML IM SUSR: 0.5000 mL | Freq: Once | INTRAMUSCULAR | 1 refills | Status: AC

## 2017-10-17 NOTE — Patient Instructions (Signed)

## 2017-10-17 NOTE — Progress Notes (Signed)
   Subjective:    Patient ID: Christie [name], female    DOB: January 30, 1946, 71 y.o.   MRN: [id number]  HPI Patient arrives for a follow up on Zoloft. Patient states she is doing well on Zoloft. Patient taken medication she states her moods are significantly improved compared to where they were she would like to see them doing somewhat better but she feels that they are doing much better compared to where they were she denies being suicidal.   Patient states she was taken off Lozol and feet started swelling so she was restarted at half dose.  She is started back at 1.25 mg daily this was sent in via phone message a few weeks ago states blood pressure under better control swelling under better control.   Review of Systems  Constitutional: Negative for activity change, fatigue and fever.  HENT: Negative for congestion.   Respiratory: Negative for cough, chest tightness and shortness of breath.   Cardiovascular: Negative for chest pain and leg swelling.  Gastrointestinal: Negative for abdominal pain.  Skin: Negative for color change.  Neurological: Negative for headaches.  Psychiatric/Behavioral: Negative for behavioral problems.       Objective:   Physical Exam  Constitutional: She appears well-developed and well-nourished. No distress.  HENT:  Head: Normocephalic and atraumatic.  Eyes: Right eye exhibits no discharge. Left eye exhibits no discharge.  Neck: No tracheal deviation present.  Cardiovascular: Normal rate, regular rhythm and normal heart sounds.   No murmur heard. Pulmonary/Chest: Effort normal and breath sounds normal. No respiratory distress. She has no wheezes. She has no rales.  Musculoskeletal: She exhibits no edema.  Lymphadenopathy:    She has no cervical adenopathy.  Neurological: She is alert. She exhibits normal muscle tone.  Skin: Skin is warm and dry. No erythema.  Psychiatric: Her behavior is normal.  Vitals reviewed.         Assessment & Plan:  Depression  doing much better on medication patient does not want to go up on the dose stick with current dose follow-up again by early spring follow-up sooner problems  Blood pressure patient currently using the lower dose in the provide 1.25 mg along with losartan blood pressure overall doing well no change with standing lab work comprehensive on follow-up in early spring

## 2017-10-18 ENCOUNTER — Ambulatory Visit (HOSPITAL_COMMUNITY): Payer: Medicare Other

## 2017-10-18 ENCOUNTER — Telehealth (HOSPITAL_COMMUNITY): Payer: Self-pay | Admitting: Family Medicine

## 2017-10-18 NOTE — Telephone Encounter (Signed)
10/18/17  Pt cx said that she wouldn't be able to come today

## 2017-10-20 ENCOUNTER — Ambulatory Visit (HOSPITAL_COMMUNITY): Payer: Medicare Other | Attending: Neurosurgery

## 2017-10-20 ENCOUNTER — Encounter (HOSPITAL_COMMUNITY): Payer: Self-pay

## 2017-10-20 ENCOUNTER — Telehealth (HOSPITAL_COMMUNITY): Payer: Self-pay

## 2017-10-20 DIAGNOSIS — M79604 Pain in right leg: Secondary | ICD-10-CM | POA: Diagnosis present

## 2017-10-20 DIAGNOSIS — M6281 Muscle weakness (generalized): Secondary | ICD-10-CM

## 2017-10-20 DIAGNOSIS — R29898 Other symptoms and signs involving the musculoskeletal system: Secondary | ICD-10-CM | POA: Insufficient documentation

## 2017-10-20 DIAGNOSIS — G8929 Other chronic pain: Secondary | ICD-10-CM | POA: Insufficient documentation

## 2017-10-20 DIAGNOSIS — M545 Low back pain: Secondary | ICD-10-CM | POA: Insufficient documentation

## 2017-10-20 NOTE — Therapy (Signed)
[place], [place], [address] Phone: [phone]   Fax:  [phone]  Physical Therapy Treatment  Patient Details  Name: Christie [name] MRN: [id number] Date of Birth: 1946/04/12 Referring Provider: Sallee Lange, MD  Encounter Date: 10/20/2017      PT End of Session - 10/20/17 1522    Visit Number 10   Number of Visits 13   Date for PT Re-Evaluation 10/25/17   Authorization Type UHC Medicare; gcode complete 10/17 visit #6   Authorization Time Period 09/13/17 to 10/25/17   Authorization - Visit Number 10   Authorization - Number of Visits 16   PT Start Time 1518   PT Stop Time 1602   PT Time Calculation (min) 44 min   Activity Tolerance Patient tolerated treatment well   Behavior During Therapy [facility] for tasks assessed/performed      Past Medical History:  Diagnosis Date  . Cancer [facility]) 1999   external vulva lesion  . Depression   . DM (diabetes mellitus) ([place]) 2012   oral medications  . Elevated liver enzymes   . History of kidney stones   . Hyperlipidemia   . Hypertension 2000  . Spastic colon     Past Surgical History:  Procedure Laterality Date  . ANTERIOR LAT LUMBAR FUSION Right 07/04/2017   Procedure: Right LUMBAR TWO-THREE LUMBAR THREE-FOUR LUMBAR FOUR-FIVE Anterior lateral lumbar interbody fusion with percutaneous pedicle screws;  Surgeon: Erline Levine, MD;  Location: [place];  Service: Neurosurgery;  Laterality: Right;  Right lateral approach   . ANTERIOR LATERAL LUMBAR FUSION WITH PERCUTANEOUS SCREW 3 LEVEL N/A 07/04/2017   Procedure: PERCUTANEOUS SCREW THREE LEVEL LUMBAR TWO-THREE LUMBAR THREE-FOUR LUMBAR FOUR-FIVE;  Surgeon: Erline Levine, MD;  Location: [place];  Service: Neurosurgery;  Laterality: N/A;  Prone  . BACK SURGERY  2004 and 2017  . CHOLECYSTECTOMY    . COLONOSCOPY  feb 2005  . COLONOSCOPY N/A 02/13/2014   Procedure: COLONOSCOPY;  Surgeon: Rogene Houston, MD;  [facility];   Service: Endoscopy;  Laterality: N/A;  930-moved to [address] notified pt  . ESOPHAGOGASTRODUODENOSCOPY  09/2005  . EYE SURGERY Right    catatract removed  . EYE SURGERY Right    macular   surgery   . KIDNEY STONE SURGERY     for a staghorn kidney stone  . STAPEDECTOMY Bilateral    Right 1991 and Left 1994  . TOTAL ABDOMINAL HYSTERECTOMY  1982  . vulva cancer surgery  1999    There were no vitals filed for this visit.      Subjective Assessment - 10/20/17 1519    Subjective Pt stated she sat for long time watching grandson's football team play, current pain scale 3/10.   Patient Stated Goals get some strength back in legs   Currently in Pain? Yes   Pain Score 3    Pain Location Back   Pain Orientation Lower   Pain Descriptors / Indicators Sore;Tightness   Pain Type Chronic pain   Pain Onset More than a month ago   Pain Frequency Constant   Aggravating Factors  sitting, standing, walking too long   Pain Relieving Factors pain meds   Effect of Pain on Daily Activities increases                         [facility] Adult PT Treatment/Exercise - 10/20/17 0001      Lumbar Exercises: Stretches  Single Knee to Chest Stretch 2 reps;30 seconds   Single Knee to Chest Stretch Limitations BLE   Double Knee to Chest Stretch 2 reps;30 seconds   Piriformis Stretch 30 seconds   Piriformis Stretch Limitations figure 4 supine BLE     Lumbar Exercises: Standing   Shoulder Adduction Limitations BLE SLS on foam 5x each   Other Standing Lumbar Exercises deadlifts from 6" step with 10# DB x15 reps   Other Standing Lumbar Exercises goblet squats with 5# DB 2x15     Lumbar Exercises: Supine   Bent Knee Raise 10 reps;5 seconds   Bent Knee Raise Limitations 2 sets, with pulldowns with purple band   Straight Leg Raise 15 reps   Straight Leg Raises Limitations BLE with 2#     Lumbar Exercises: Sidelying   Hip Abduction 15 reps   Hip Abduction Weights (lbs) 2   Hip Abduction  Limitations BLE     Manual Therapy   Manual Therapy Myofascial release   Manual therapy comments completed separate rest of treatment   Myofascial Release Supine position with LE elevated focus on quad VMO, rectus femorist as well as glut med/piriformis                  PT Short Term Goals - 10/04/17 1726      PT SHORT TERM GOAL #1   Title Pt will be independent with HEP and perform consistently to maximzie return to PLOF.   Baseline Notes she does them almost every day at least once a day   Time 3   Period Weeks   Status On-going     PT SHORT TERM GOAL #2   Title Pt will have improved MMT of all muscle groups tested of at least 1/2 grade to decrease pain and maximize gait and balance.    Baseline No pain noted, however, continue mm weakness proximal hip mm    Time 3   Period Weeks   Status On-going     PT SHORT TERM GOAL #3   Title Pt will be able to perform SLS on BLE for at least 10 sec with min to no unsteadiness to maximize gait on uneven ground.   Baseline SLS Rt. 3 sec, Lt. 5 sec - 3 trials each.    Time 3   Period Weeks   Status On-going           PT Long Term Goals - 09/13/17 1634      PT LONG TERM GOAL #1   Title Pt will have improved MMT of all muscle groups tested by at least 1 grade to maximze gait and her ability to ambulate stairs with confidence.   Time 6   Period Weeks   Status New   Target Date 10/25/17     PT LONG TERM GOAL #2   Title Pt will have an improved tolerance to standing and walking by at least 50% (at least 30 mins for standing and 1 hour for walking) without exacerbations in RLE pain to maximize her ability to perform chores at home.    Time 6   Period Weeks   Status New     PT LONG TERM GOAL #3   Title Pt will be able to perform 3MWT maintaining at least 1.29m/s gait speed and without exacerbations in RLE pain to demo improved tolerance to ambulation and maximize her function in the community.    Time 6   Period Weeks    Status New  Plan - 11-13-2017 1545    Clinical Impression Statement Continued session focus on lumbar mobility, core and proximal musculatyre strenghtening and improving soft tissue limitations. Pt able to complete all therex with good form following initial cuieng for stability with task, noted visible LE fatigue with SLR.  EOS with MFR with primary focus with VMO, rectus femoris and Rt glut med/piriformos with ability to resolve spasms 80%.  No reoprts of pain at EOS.     Rehab Potential Fair   PT Frequency 2x / week   PT Duration 6 weeks   PT Treatment/Interventions ADLs/Self Care Home Management;Aquatic Therapy;Cryotherapy;Electrical Stimulation;Moist Heat;DME Instruction;Gait training;Stair training;Functional mobility training;Therapeutic activities;Therapeutic exercise;Balance training;Neuromuscular re-education;Patient/family education;Manual techniques;Passive range of motion;Dry needling;Taping   PT Next Visit Plan continue glute and core strengthening; continue manual to glutes, VLO, VMO and R iliopsoas; continue balance training, deadlifts from elevated surface; progress core stability   PT Home Exercise Plan eval: sit <> stands, standing hip abd with RTB; 9/28: bridging with ankle DF, clams with RTB; 10/5: sidestep with RTB; 10/19; modified adductor stretch to standing; 10/24: SLR, s/l hip abd, piriformis stretch; 10/26: SLS, SLS with vectors      Patient will benefit from skilled therapeutic intervention in order to improve the following deficits and impairments:  Abnormal gait, Decreased activity tolerance, Decreased balance, Decreased endurance, Decreased mobility, Decreased range of motion, Decreased strength, Difficulty walking, Increased muscle spasms, Improper body mechanics, Impaired flexibility, Postural dysfunction, Pain  Visit Diagnosis: Pain in right leg  Muscle weakness (generalized)  Other symptoms and signs involving the musculoskeletal  system  Chronic bilateral low back pain without sciatica       G-Codes - 11-13-17 5956    Functional Assessment Tool Used (Outpatient Only) clinical judgement, 5xSTS, 3MWT, SLS, MMT      Problem List Patient Active Problem List   Diagnosis Date Noted  . Lumbar spine scoliosis 07/04/2017  . Aortic stenosis, mild 11/14/2016  . Elevated alkaline phosphatase level 05/17/2015  . Urethral diverticulum 10/14/2014  . Lichen sclerosus et atrophicus 10/14/2014  . Poorly controlled type 2 diabetes mellitus ([place]) 09/29/2014  . Insomnia 09/29/2014  . Other malaise and fatigue 04/04/2014  . Depression 04/04/2014  . Essential hypertension, benign 08/13/2013  . Hyperlipidemia 08/13/2013  . History of kidney stones 08/13/2013  . Elevated liver enzymes 08/13/2013  . Vulvar cancer [facility]) 04/12/2013    Ihor Austin, [place]; [place]  Aldona Lento 11-13-17, 4:03 PM  [place] [address] [place], [place], [address] Phone: [phone]   Fax:  [phone]  Name: DEZHANE STATEN MRN: [id number] Date of Birth: Aug 27, 1946

## 2017-10-20 NOTE — Telephone Encounter (Signed)
Pt request to stay with Jerene Pitch when possible. NF 10/20/17

## 2017-10-23 ENCOUNTER — Other Ambulatory Visit (HOSPITAL_COMMUNITY): Payer: Self-pay | Admitting: Family Medicine

## 2017-10-25 ENCOUNTER — Ambulatory Visit (HOSPITAL_COMMUNITY): Payer: Medicare Other

## 2017-10-25 ENCOUNTER — Encounter (HOSPITAL_COMMUNITY): Payer: Self-pay

## 2017-10-25 DIAGNOSIS — R29898 Other symptoms and signs involving the musculoskeletal system: Secondary | ICD-10-CM

## 2017-10-25 DIAGNOSIS — M545 Low back pain: Secondary | ICD-10-CM

## 2017-10-25 DIAGNOSIS — M6281 Muscle weakness (generalized): Secondary | ICD-10-CM

## 2017-10-25 DIAGNOSIS — M79604 Pain in right leg: Secondary | ICD-10-CM

## 2017-10-25 DIAGNOSIS — G8929 Other chronic pain: Secondary | ICD-10-CM

## 2017-10-25 NOTE — Patient Instructions (Signed)
  FRONT STEP-UPS  Step up onto stool or step with one leg and then step back down.  Repeat.   LATERAL STEP-UPS  Stand on stool or step with involved leg.  Lower the uninvolved leg until the heel touches the floor, then press back up using the muscles in the involved leg only.  Repeat.    Add in with other exercises, 2-3 sets of 10-15 reps on each leg

## 2017-10-25 NOTE — Therapy (Signed)
[place], [place], [address] Phone: [phone]   Fax:  [phone]  Physical Therapy Treatment/Reassessment  Patient Details  Name: Christie [name] MRN: [id number] Date of Birth: [birth year] Referring Provider: Sallee Lange, MD   Encounter Date: 10/25/2017  PT End of Session - 10/25/17 1524    Visit Number  11    Number of Visits  17    Date for PT Re-Evaluation  11/22/17    Authorization Type  [employer] Medicare; gcode complete 11/7, visit #11    Authorization Time Period  09/13/17 to 10/25/17; NEW: 10/25/17 to 11/22/17    Authorization - Visit Number  11    Authorization - Number of Visits  17    PT Start Time  1519    PT Stop Time  1559    PT Time Calculation (min)  40 min    Activity Tolerance  Patient tolerated treatment well    Behavior During Therapy  [facility] for tasks assessed/performed       Past Medical History:  Diagnosis Date  . Cancer [facility]) 1999   external vulva lesion  . Depression   . DM (diabetes mellitus) ([place]) 2012   oral medications  . Elevated liver enzymes   . History of kidney stones   . Hyperlipidemia   . Hypertension 2000  . Spastic colon     Past Surgical History:  Procedure Laterality Date  . BACK SURGERY  2004 and 2017  . CHOLECYSTECTOMY    . COLONOSCOPY  feb 2005  . ESOPHAGOGASTRODUODENOSCOPY  09/2005  . EYE SURGERY Right    catatract removed  . EYE SURGERY Right    macular   surgery   . KIDNEY STONE SURGERY     for a staghorn kidney stone  . STAPEDECTOMY Bilateral    Right 1991 and Left 1994  . TOTAL ABDOMINAL HYSTERECTOMY  1982  . vulva cancer surgery  1999    There were no vitals filed for this visit.  Subjective Assessment - 10/25/17 1522    Subjective  Pt states that she is still having pain in her RLE. She rates it a 4/10 today.    Patient Stated Goals  get some strength back in legs    Currently in Pain?  Yes    Pain Score  4     Pain Location  Leg    Pain  Orientation  Right    Pain Descriptors / Indicators  Sore;Tightness    Pain Type  Chronic pain    Pain Onset  More than a month ago    Pain Frequency  Constant    Aggravating Factors   sitting, standing, walking too long    Pain Relieving Factors  pain meds    Effect of Pain on Daily Activities  increases         [facility] PT Assessment - 10/25/17 0001      Strength   Right Hip Flexion  4+/5 was 4+   was 4+   Right Hip Extension  4-/5 was 2+   was 2+   Right Hip ABduction  4/5 was 4-   was 4-   Left Hip Flexion  4+/5 was 4+   was 4+   Left Hip Extension  4+/5 was 4+   was 4+   Left Hip ABduction  4/5 was 3+   was 3+   Right Knee Flexion  5/5 was 4+   was 4+   Right Ankle Dorsiflexion  5/5 was 4+   was 4+   Left Ankle Dorsiflexion  4+/5 was 4+   was 4+     Ambulation/Gait   Ambulation Distance (Feet)  710 Feet 3MWT   3MWT   Assistive device  None    Gait Pattern  Step-through pattern;Decreased arm swing - right;Decreased arm swing - left;Trendelenburg    Gait velocity  1.62ms      Balance   Balance Assessed  Yes      Static Standing Balance   Static Standing - Balance Support  No upper extremity supported    Static Standing Balance -  Activities   Single Leg Stance - Right Leg;Single Leg Stance - Left Leg    Static Standing - Comment/# of Minutes  R and L: 10 sec or < (4 trials)              [facility] Adult PT Treatment/Exercise - 10/25/17 0001      Lumbar Exercises: Stretches   Standing Extension Limitations  standing adductor stretch, RLE only (reviewed form for HEP)      Lumbar Exercises: Standing   Other Standing Lumbar Exercises  fwd/lat step ups on 6" step x10 reps each           PT Education - 10/25/17 1524    Education provided  Yes    Education Details  reassessment findings, POC, HEP; lots of encouragement to perform HEP as instructed; reviewed standing adductor stretch    Person(s) Educated  Patient    Methods   Explanation;Demonstration;Handout    Comprehension  Verbalized understanding;Returned demonstration       PT Short Term Goals - 10/25/17 1525      PT SHORT TERM GOAL #1   Title  Pt will be independent with HEP and perform consistently to maximzie return to PLOF.    Baseline  11/7: pt states that she doesn't like to do her exercises; but she is "doing some"    Time  3    Period  Weeks    Status  On-going      PT SHORT TERM GOAL #2   Title  Pt will have improved MMT of all muscle groups tested of at least 1/2 grade to decrease pain and maximize gait and balance.     Baseline  No pain noted, however, continue mm weakness proximal hip mm     Time  3    Period  Weeks    Status  Partially Met      PT SHORT TERM GOAL #3   Title  Pt will be able to perform SLS on BLE for at least 10 sec with min to no unsteadiness to maximize gait on uneven ground.    Baseline  SLS Rt. 3 sec, Lt. 5 sec - 3 trials each.     Time  3    Period  Weeks    Status  Achieved        PT Long Term Goals - 10/25/17 1525      PT LONG TERM GOAL #1   Title  Pt will have improved MMT of all muscle groups tested by at least 1 grade to maximze gait and her ability to ambulate stairs with confidence.    Time  6    Period  Weeks    Status  Partially Met      PT LONG TERM GOAL #2   Title  Pt will have an improved tolerance to standing and walking by at least 50% (  at least 30 mins for standing and 1 hour for walking) without exacerbations in RLE pain to maximize her ability to perform chores at home.     Baseline  11/7: 20-25 mins for standing; 15 mins for walking; still limited due to RLE pain; her chores have gotten easier at home though    Time  6    Period  Weeks    Status  On-going      PT LONG TERM GOAL #3   Title  Pt will be able to perform 3MWT maintaining at least 1.37ms gait speed and without exacerbations in RLE pain to demo improved tolerance to ambulation and maximize her function in the community.      Baseline  11/7: 7141f maintaining 1.68m38m with decreased pain following/no increases    Time  6    Period  Weeks    Status  Achieved            Plan - 10/25/17 1602    Clinical Impression Statement  PT reassessed pt's goals and outcome measures this date. Pt has made good progress towards all goals as illustrated above. Her proximal hip strength, tolerance to WB activities, and compliance with HEP are still her most deficient areas. Pt reports feeling 60% improved since starting therapy, stating that she can tell her legs are getting stronger and the numbness that she originally c/o on the lateral aspect of her thigh has resolved. However, she reports that her main limiting factor is pain on the medial aspect of her R thigh. PT recommends brief extension of POC for 1x/week for 4 weeks to address remaining strength deficits, soft tissue restrictions, and improved tolerance to WB activities in order to maximize overall QOL and promote independent self-management at home.    Rehab Potential  Fair    PT Frequency  2x / week    PT Duration  6 weeks    PT Treatment/Interventions  ADLs/Self Care Home Management;Aquatic Therapy;Cryotherapy;Electrical Stimulation;Moist Heat;DME Instruction;Gait training;Stair training;Functional mobility training;Therapeutic activities;Therapeutic exercise;Balance training;Neuromuscular re-education;Patient/family education;Manual techniques;Passive range of motion;Dry needling;Taping    PT Next Visit Plan  progress glute and core strengthening; continue manual to R adductors mainly (glutes, VLO, VMO and R iliopsoas PRN); continue balance training, deadlifts from elevated surface; encourage daily walking program    PT Home Exercise Plan  eval: sit <> stands, standing hip abd with RTB; 9/28: bridging with ankle DF, clams with RTB; 10/5: sidestep with RTB; 10/19; modified adductor stretch to standing; 10/24: SLR, s/l hip abd, piriformis stretch; 10/26: SLS, SLS with  vectors; 11/7: fwd/lat step ups    Consulted and Agree with Plan of Care  Patient       Patient will benefit from skilled therapeutic intervention in order to improve the following deficits and impairments:  Abnormal gait, Decreased activity tolerance, Decreased balance, Decreased endurance, Decreased mobility, Decreased range of motion, Decreased strength, Difficulty walking, Increased muscle spasms, Improper body mechanics, Impaired flexibility, Postural dysfunction, Pain  Visit Diagnosis: Pain in right leg - Plan: PT plan of care cert/re-cert  Muscle weakness (generalized) - Plan: PT plan of care cert/re-cert  Other symptoms and signs involving the musculoskeletal system - Plan: PT plan of care cert/re-cert  Chronic bilateral low back pain without sciatica - Plan: PT plan of care cert/re-cert   G-Codes - 11/85/90/9313    Functional Assessment Tool Used (Outpatient Only)  clinical judgement, 5xSTS, 3MWT, SLS, MMT    Functional Limitation  Mobility: Walking and moving around    Mobility:  Walking and Moving Around Current Status [phone])  At least 20 percent but less than 40 percent impaired, limited or restricted    Mobility: Walking and Moving Around Goal Status [phone])  At least 1 percent but less than 20 percent impaired, limited or restricted       Problem List Patient Active Problem List   Diagnosis Date Noted  . Lumbar spine scoliosis 07/04/2017  . Aortic stenosis, mild 11/14/2016  . Elevated alkaline phosphatase level 05/17/2015  . Urethral diverticulum 10/14/2014  . Lichen sclerosus et atrophicus 10/14/2014  . Poorly controlled type 2 diabetes mellitus ([place]) 09/29/2014  . Insomnia 09/29/2014  . Other malaise and fatigue 04/04/2014  . Depression 04/04/2014  . Essential hypertension, benign 08/13/2013  . Hyperlipidemia 08/13/2013  . History of kidney stones 08/13/2013  . Elevated liver enzymes 08/13/2013  . Vulvar cancer ([place]) 04/12/2013       Geraldine Solar PT,  [place] [address] [place], [place], [address] Phone: [phone]   Fax:  [phone]  Name: Christie [name] MRN: [id number] Date of Birth: Nov 21, 1946

## 2017-11-02 ENCOUNTER — Ambulatory Visit (HOSPITAL_COMMUNITY): Payer: Medicare Other

## 2017-11-02 ENCOUNTER — Encounter (HOSPITAL_COMMUNITY): Payer: Self-pay

## 2017-11-02 DIAGNOSIS — M79604 Pain in right leg: Secondary | ICD-10-CM

## 2017-11-02 DIAGNOSIS — G8929 Other chronic pain: Secondary | ICD-10-CM

## 2017-11-02 DIAGNOSIS — M545 Low back pain, unspecified: Secondary | ICD-10-CM

## 2017-11-02 DIAGNOSIS — R29898 Other symptoms and signs involving the musculoskeletal system: Secondary | ICD-10-CM

## 2017-11-02 DIAGNOSIS — M6281 Muscle weakness (generalized): Secondary | ICD-10-CM

## 2017-11-02 NOTE — Therapy (Signed)
[place], [place], [address] Phone: [phone]   Fax:  [phone]  Physical Therapy Treatment  Patient Details  Name: Christie [name] MRN: [id number] Date of Birth: 1946/02/11 Referring Provider: Sallee Lange, MD   Encounter Date: 11/02/2017  PT End of Session - 11/02/17 1346    Visit Number  12    Number of Visits  17    Date for PT Re-Evaluation  11/22/17    Authorization Type  [employer] Medicare; gcode complete 11/7, visit #11    Authorization Time Period  09/13/17 to 10/25/17; NEW: 10/25/17 to 11/22/17    Authorization - Visit Number  12    Authorization - Number of Visits  17    PT Start Time  3536    PT Stop Time  1428    PT Time Calculation (min)  43 min    Activity Tolerance  Patient tolerated treatment well    Behavior During Therapy  [facility] for tasks assessed/performed       Past Medical History:  Diagnosis Date  . Cancer [facility]) 1999   external vulva lesion  . Depression   . DM (diabetes mellitus) ([place]) 2012   oral medications  . Elevated liver enzymes   . History of kidney stones   . Hyperlipidemia   . Hypertension 2000  . Spastic colon     Past Surgical History:  Procedure Laterality Date  . ANTERIOR LAT LUMBAR FUSION Right 07/04/2017   Procedure: Right LUMBAR TWO-THREE LUMBAR THREE-FOUR LUMBAR FOUR-FIVE Anterior lateral lumbar interbody fusion with percutaneous pedicle screws;  Surgeon: Erline Levine, MD;  Location: [facility];  Service: Neurosurgery;  Laterality: Right;  Right lateral approach   . ANTERIOR LATERAL LUMBAR FUSION WITH PERCUTANEOUS SCREW 3 LEVEL N/A 07/04/2017   Procedure: PERCUTANEOUS SCREW THREE LEVEL LUMBAR TWO-THREE LUMBAR THREE-FOUR LUMBAR FOUR-FIVE;  Surgeon: Erline Levine, MD;  Location: [facility];  Service: Neurosurgery;  Laterality: N/A;  Prone  . BACK SURGERY  2004 and 2017  . CHOLECYSTECTOMY    . COLONOSCOPY  feb 2005  . COLONOSCOPY N/A 02/13/2014   Procedure: COLONOSCOPY;  Surgeon:  Rogene [place], MD;  [facility];  Service: Endoscopy;  Laterality: N/A;  930-moved to [address] Ann notified pt  . ESOPHAGOGASTRODUODENOSCOPY  09/2005  . EYE SURGERY Right    catatract removed  . EYE SURGERY Right    macular   surgery   . KIDNEY STONE SURGERY     for a staghorn kidney stone  . STAPEDECTOMY Bilateral    Right 1991 and Left 1994  . TOTAL ABDOMINAL HYSTERECTOMY  1982  . vulva cancer surgery  1999    There were no vitals filed for this visit.  Subjective Assessment - 11/02/17 1347    Subjective  Pt states that her bones hurt today, likely due to the weather. She is still having that same pain her R leg. She found a soft tissue massage roller at [employer] and she bought it and has used it on her legs. She states that it felt good.     Patient Stated Goals  get some strength back in legs    Currently in Pain?  Yes    Pain Score  4     Pain Location  Leg    Pain Orientation  Right    Pain Descriptors / Indicators  Sore;Tightness    Pain Type  Chronic pain    Pain Onset  More than a month ago  Pain Frequency  Constant    Aggravating Factors   sitting, standing, walking too long    Pain Relieving Factors  pain meds    Effect of Pain on Daily Activities  increases             [facility] Adult PT Treatment/Exercise - 11/02/17 0001      Lumbar Exercises: Stretches   Active Hamstring Stretch Limitations  10x10" holds with max hip flexion    Standing Extension Limitations  supine adductor stretch 3x30 sec    Quad Stretch  4 reps;30 seconds    Quad Stretch Limitations  prone with rope      Lumbar Exercises: Standing   Push / Pull Sled  R SLS with vectors x5RT    Other Standing Lumbar Exercises  deadlifts from 4" step with 10# DB 2x10 reps    Other Standing Lumbar Exercises  goblet squats 2x10 reps      Manual Therapy   Manual Therapy  Myofascial release    Manual therapy comments  completed separate rest of treatment    Myofascial Release  Hooklying position  MFR to medial distal HS, distal ADD and middle-distal VMO (significant trigger points and pain to palpation)            PT Education - 11/02/17 1350    Education provided  Yes    Education Details  tightness in HS, ADD, distal VMO and stretching to address; self-soft tissue mobilization with the stick at home     Person(s) Educated  Patient;Child(ren)    Methods  Explanation    Comprehension  Verbalized understanding       PT Short Term Goals - 10/25/17 1525      PT SHORT TERM GOAL #1   Title  Pt will be independent with HEP and perform consistently to maximzie return to PLOF.    Baseline  11/7: pt states that she doesn't like to do her exercises; but she is "doing some"    Time  3    Period  Weeks    Status  On-going      PT SHORT TERM GOAL #2   Title  Pt will have improved MMT of all muscle groups tested of at least 1/2 grade to decrease pain and maximize gait and balance.     Baseline  No pain noted, however, continue mm weakness proximal hip mm     Time  3    Period  Weeks    Status  Partially Met      PT SHORT TERM GOAL #3   Title  Pt will be able to perform SLS on BLE for at least 10 sec with min to no unsteadiness to maximize gait on uneven ground.    Baseline  SLS Rt. 3 sec, Lt. 5 sec - 3 trials each.     Time  3    Period  Weeks    Status  Achieved        PT Long Term Goals - 10/25/17 1525      PT LONG TERM GOAL #1   Title  Pt will have improved MMT of all muscle groups tested by at least 1 grade to maximze gait and her ability to ambulate stairs with confidence.    Time  6    Period  Weeks    Status  Partially Met      PT LONG TERM GOAL #2   Title  Pt will have an improved tolerance to standing and walking  by at least 50% (at least 30 mins for standing and 1 hour for walking) without exacerbations in RLE pain to maximize her ability to perform chores at home.     Baseline  11/7: 20-25 mins for standing; 15 mins for walking; still limited due to RLE  pain; her chores have gotten easier at home though    Time  6    Period  Weeks    Status  On-going      PT LONG TERM GOAL #3   Title  Pt will be able to perform 3MWT maintaining at least 1.1ms gait speed and without exacerbations in RLE pain to demo improved tolerance to ambulation and maximize her function in the community.     Baseline  11/7: 7161f maintaining 1.2m43m with decreased pain following/no increases    Time  6    Period  Weeks    Status  Achieved            Plan - 11/02/17 1630    Clinical Impression Statement  Began session with MFR to R distal medial HS, distal adductors, and middle-distal VMO with palpable knots and trigger points that were painful to palpation. Followed up manual therapy with stretching to address these soft tissue restrictions and added them to HEP accordingly. Rest of session consisted of gluteal and functional strengthening as well as dynamic hip stability. Pt reported no pain at EOS, just soreness from manual therapy. Pt's daughter present during session who is a PTA so PT educated the dtr on the muscle tightness of pt's RLE and that she could work on these for the pt at home. Pt reported that [facility] a roller [facility] [employer] as planned.    Rehab Potential  Fair    PT Frequency  2x / week    PT Duration  6 weeks    PT Treatment/Interventions  ADLs/Self Care Home Management;Aquatic Therapy;Cryotherapy;Electrical Stimulation;Moist Heat;DME Instruction;Gait training;Stair training;Functional mobility training;Therapeutic activities;Therapeutic exercise;Balance training;Neuromuscular re-education;Patient/family education;Manual techniques;Passive range of motion;Dry needling;Taping    PT Next Visit Plan  progress glute and core strengthening; continue manual to R adductors mainly (glutes, VLO, VMO and R iliopsoas PRN); continue balance training, deadlifts from elevated surface; encourage daily walking program; add hip hikes, monster walks     PT Home Exercise Plan  eval: sit <> stands, standing hip abd with RTB; 9/28: bridging with ankle DF, clams with RTB; 10/5: sidestep with RTB; 10/19; modified adductor stretch to standing; 10/24: SLR, s/l hip abd, piriformis stretch; 10/26: SLS, SLS with vectors; 11/7: fwd/lat step ups; 11/15: supine HS stretch with ER (hands behind knee), prone quad stretch    Consulted and Agree with Plan of Care  Patient       Patient will benefit from skilled therapeutic intervention in order to improve the following deficits and impairments:  Abnormal gait, Decreased activity tolerance, Decreased balance, Decreased endurance, Decreased mobility, Decreased range of motion, Decreased strength, Difficulty walking, Increased muscle spasms, Improper body mechanics, Impaired flexibility, Postural dysfunction, Pain  Visit Diagnosis: Pain in right leg  Muscle weakness (generalized)  Other symptoms and signs involving the musculoskeletal system  Chronic bilateral low back pain without sciatica     Problem List Patient Active Problem List   Diagnosis Date Noted  . Lumbar spine scoliosis 07/04/2017  . Aortic stenosis, mild 11/14/2016  . Elevated alkaline phosphatase level 05/17/2015  . Urethral diverticulum 10/14/2014  . Lichen sclerosus et atrophicus 10/14/2014  . Poorly controlled type 2 diabetes mellitus ([place]  09/29/2014  . Insomnia 09/29/2014  . Other malaise and fatigue 04/04/2014  . Depression 04/04/2014  . Essential hypertension, benign 08/13/2013  . Hyperlipidemia 08/13/2013  . History of kidney stones 08/13/2013  . Elevated liver enzymes 08/13/2013  . Vulvar cancer ([place]) 04/12/2013        Geraldine Solar PT, [place] [address] [place], [place], [address] Phone: [phone]   Fax:  [phone]  Name: TYA HAUGHEY MRN: [id number] Date of Birth: 11-26-1946

## 2017-11-02 NOTE — Patient Instructions (Addendum)
  HAMSTRING STRETCH - SUPINE  While lying on your back, raise up your leg and hold the back of your knee until a stretch is felt. Be sure to turn your right toes outwards to increase stretch on inside of leg.   Perform 1x/day, 10-15 stretches holding for 10-15 seconds each   Prone Quad Stretch  Lie down flat on your stomach. Wrap a strap (belt, towel, dog leash) around the top of one of your feet and pull the strap across your opposite shoulder so that your knee starts to curl up to your body. Pull until a stretch is felt across the front of your thigh.     Perform 1x/day, 3-5 stretches holding for 30-60 seconds each

## 2017-11-03 ENCOUNTER — Telehealth: Payer: Self-pay | Admitting: Family Medicine

## 2017-11-03 NOTE — Telephone Encounter (Signed)
Review screening mammogram results in results folder. °

## 2017-11-06 NOTE — Telephone Encounter (Signed)
Mammography normal

## 2017-11-07 ENCOUNTER — Encounter (HOSPITAL_COMMUNITY): Payer: Self-pay

## 2017-11-07 ENCOUNTER — Ambulatory Visit (HOSPITAL_COMMUNITY): Payer: Medicare Other

## 2017-11-07 DIAGNOSIS — M79604 Pain in right leg: Secondary | ICD-10-CM

## 2017-11-07 DIAGNOSIS — M545 Low back pain, unspecified: Secondary | ICD-10-CM

## 2017-11-07 DIAGNOSIS — G8929 Other chronic pain: Secondary | ICD-10-CM

## 2017-11-07 DIAGNOSIS — M6281 Muscle weakness (generalized): Secondary | ICD-10-CM

## 2017-11-07 DIAGNOSIS — R29898 Other symptoms and signs involving the musculoskeletal system: Secondary | ICD-10-CM

## 2017-11-07 NOTE — Therapy (Signed)
[place], [place], [address] Phone: [phone]   Fax:  [phone]  Physical Therapy Treatment  Patient Details  Name: Christie [name] MRN: [id number] Date of Birth: May 21, 1946 Referring Provider: Sallee Lange, MD   Encounter Date: 11/07/2017  PT End of Session - 11/07/17 1429    Visit Number  13    Number of Visits  17    Date for PT Re-Evaluation  11/22/17    Authorization Type  [employer] Medicare; gcode complete 11/7, visit #11    Authorization Time Period  09/13/17 to 10/25/17; NEW: 10/25/17 to 11/22/17    Authorization - Visit Number  37    Authorization - Number of Visits  17    PT Start Time  1346    PT Stop Time  1429    PT Time Calculation (min)  43 min    Activity Tolerance  Patient tolerated treatment well    Behavior During Therapy  [facility] for tasks assessed/performed       Past Medical History:  Diagnosis Date  . Cancer [facility]) 1999   external vulva lesion  . Depression   . DM (diabetes mellitus) ([place]) 2012   oral medications  . Elevated liver enzymes   . History of kidney stones   . Hyperlipidemia   . Hypertension 2000  . Spastic colon     Past Surgical History:  Procedure Laterality Date  . ANTERIOR LAT LUMBAR FUSION Right 07/04/2017   Procedure: Right LUMBAR TWO-THREE LUMBAR THREE-FOUR LUMBAR FOUR-FIVE Anterior lateral lumbar interbody fusion with percutaneous pedicle screws;  Surgeon: Erline Levine, MD;  Location: [place];  Service: Neurosurgery;  Laterality: Right;  Right lateral approach   . ANTERIOR LATERAL LUMBAR FUSION WITH PERCUTANEOUS SCREW 3 LEVEL N/A 07/04/2017   Procedure: PERCUTANEOUS SCREW THREE LEVEL LUMBAR TWO-THREE LUMBAR THREE-FOUR LUMBAR FOUR-FIVE;  Surgeon: Erline Levine, MD;  Location: [place];  Service: Neurosurgery;  Laterality: N/A;  Prone  . BACK SURGERY  2004 and 2017  . CHOLECYSTECTOMY    . COLONOSCOPY  feb 2005  . COLONOSCOPY N/A 02/13/2014   Procedure: COLONOSCOPY;  Surgeon:  Rogene Houston, MD;  [facility];  Service: Endoscopy;  Laterality: N/A;  930-moved to [address] notified pt  . ESOPHAGOGASTRODUODENOSCOPY  09/2005  . EYE SURGERY Right    catatract removed  . EYE SURGERY Right    macular   surgery   . KIDNEY STONE SURGERY     for a staghorn kidney stone  . STAPEDECTOMY Bilateral    Right 1991 and Left 1994  . TOTAL ABDOMINAL HYSTERECTOMY  1982  . vulva cancer surgery  1999    There were no vitals filed for this visit.  Subjective Assessment - 11/07/17 1349    Subjective  Pt reports that she is feeling better overall and that she is really working on her RLE at home. She reports 2/10 pain at her R lower back and RLE currently.    Patient Stated Goals  get some strength back in legs    Currently in Pain?  Yes    Pain Score  2     Pain Location  Back and RLE    Pain Orientation  Right    Pain Descriptors / Indicators  Aching    Pain Type  Chronic pain    Pain Onset  More than a month ago    Pain Frequency  Constant    Aggravating Factors   sitting, standing,  walking too long    Pain Relieving Factors  pain meds    Effect of Pain on Daily Activities  increases           [facility] Adult PT Treatment/Exercise - 11/07/17 0001      Lumbar Exercises: Stretches   Active Hamstring Stretch Limitations  10x10" holds with max hip flexion and ER for medial HS stretch    Standing Extension Limitations  butterfly stretch 5x10" holds      Lumbar Exercises: Standing   Scapular Retraction Limitations  hip hikes on 2" step x15 BLE    Row Limitations  fwd/retro monster walks with GTB 41f x2RT    Other Standing Lumbar Exercises  deadlifts from 2" step with 10# DB x15 reps      Lumbar Exercises: Supine   Other Supine Lumbar Exercises  deadbugs with BTB UE resistance x20 reps each       Lumbar Exercises: Quadruped   Other Quadruped Lumbar Exercises  birddogs x10 reps each      Manual Therapy   Manual Therapy  Myofascial release    Manual  therapy comments  completed separate rest of treatment    Myofascial Release  Hooklying position MFR to medial distal HS, distal ADD and middle-distal VMO (significant trigger points and pain to palpation)          PT Education - 11/07/17 1429    Education provided  Yes    Education Details  added monster walks to HEP, continue self-soft tissue mobilization with stick    Person(s) Educated  Patient    Methods  Explanation;Demonstration;Handout    Comprehension  Verbalized understanding;Returned demonstration       PT Short Term Goals - 10/25/17 1525      PT SHORT TERM GOAL #1   Title  Pt will be independent with HEP and perform consistently to maximzie return to PLOF.    Baseline  11/7: pt states that she doesn't like to do her exercises; but she is "doing some"    Time  3    Period  Weeks    Status  On-going      PT SHORT TERM GOAL #2   Title  Pt will have improved MMT of all muscle groups tested of at least 1/2 grade to decrease pain and maximize gait and balance.     Baseline  No pain noted, however, continue mm weakness proximal hip mm     Time  3    Period  Weeks    Status  Partially Met      PT SHORT TERM GOAL #3   Title  Pt will be able to perform SLS on BLE for at least 10 sec with min to no unsteadiness to maximize gait on uneven ground.    Baseline  SLS Rt. 3 sec, Lt. 5 sec - 3 trials each.     Time  3    Period  Weeks    Status  Achieved        PT Long Term Goals - 10/25/17 1525      PT LONG TERM GOAL #1   Title  Pt will have improved MMT of all muscle groups tested by at least 1 grade to maximze gait and her ability to ambulate stairs with confidence.    Time  6    Period  Weeks    Status  Partially Met      PT LONG TERM GOAL #2   Title  Pt will have  an improved tolerance to standing and walking by at least 50% (at least 30 mins for standing and 1 hour for walking) without exacerbations in RLE pain to maximize her ability to perform chores at home.      Baseline  11/7: 20-25 mins for standing; 15 mins for walking; still limited due to RLE pain; her chores have gotten easier at home though    Time  6    Period  Weeks    Status  On-going      PT LONG TERM GOAL #3   Title  Pt will be able to perform 3MWT maintaining at least 1.74ms gait speed and without exacerbations in RLE pain to demo improved tolerance to ambulation and maximize her function in the community.     Baseline  11/7: 718f maintaining 1.74m39m with decreased pain following/no increases    Time  6    Period  Weeks    Status  Achieved            Plan - 11/07/17 1429    Clinical Impression Statement  Pt continues with significant restrictions of R distal quads, HS, and adductors so PT performed MFR for release. Pt continues to be tender to palpation but after manual and stretching, pt reportedno pain in her RLE. Rest of session focused on hip and core strenghtening and stabilization with good tolerance. Pt still reported mild LBP at EOS but no RLE pain.    Rehab Potential  Fair    PT Frequency  2x / week    PT Duration  6 weeks    PT Treatment/Interventions  ADLs/Self Care Home Management;Aquatic Therapy;Cryotherapy;Electrical Stimulation;Moist Heat;DME Instruction;Gait training;Stair training;Functional mobility training;Therapeutic activities;Therapeutic exercise;Balance training;Neuromuscular re-education;Patient/family education;Manual techniques;Passive range of motion;Dry needling;Taping    PT Next Visit Plan  progress glute and core strengthening; continue manual to R adductors mainly (glutes, VLO, VMO and R iliopsoas PRN); continue balance training, deadlifts from floor; encourage daily walking program; continue hip hikes, add tandem on foam for hip stability work    PT Home Exercise Plan  eval: sit <> stands, standing hip abd with RTB; 9/28: bridging with ankle DF, clams with RTB; 10/5: sidestep with RTB; 10/19; modified adductor stretch to standing; 10/24: SLR, s/l hip  abd, piriformis stretch; 10/26: SLS, SLS with vectors; 11/7: fwd/lat step ups; 11/15: supine HS stretch with ER (hands behind knee), prone quad stretch; 11/20: monster walks with GTB    Consulted and Agree with Plan of Care  Patient       Patient will benefit from skilled therapeutic intervention in order to improve the following deficits and impairments:  Abnormal gait, Decreased activity tolerance, Decreased balance, Decreased endurance, Decreased mobility, Decreased range of motion, Decreased strength, Difficulty walking, Increased muscle spasms, Improper body mechanics, Impaired flexibility, Postural dysfunction, Pain  Visit Diagnosis: Pain in right leg  Muscle weakness (generalized)  Other symptoms and signs involving the musculoskeletal system  Chronic bilateral low back pain without sciatica     Problem List Patient Active Problem List   Diagnosis Date Noted  . Lumbar spine scoliosis 07/04/2017  . Aortic stenosis, mild 11/14/2016  . Elevated alkaline phosphatase level 05/17/2015  . Urethral diverticulum 10/14/2014  . Lichen sclerosus et atrophicus 10/14/2014  . Poorly controlled type 2 diabetes mellitus ([place]/11/2014  . Insomnia 09/29/2014  . Other malaise and fatigue 04/04/2014  . Depression 04/04/2014  . Essential hypertension, benign 08/13/2013  . Hyperlipidemia 08/13/2013  . History of kidney stones 08/13/2013  . Elevated liver enzymes  08/13/2013  . Vulvar cancer ([place]) 04/12/2013        Geraldine Solar PT, [place] [address] [place], [place], [address] Phone: [phone]   Fax:  [phone]  Name: Christie [name] MRN: [id number] Date of Birth: [birth year]

## 2017-11-07 NOTE — Patient Instructions (Addendum)
  Monster [employer] and Backward   With a band around your ankles in a wide stance, step forward with one foot while keeping tension on the band, then bring the other foot forward. Take a couple of steps forward and then repeat backwards  Add in with others, 2-5 laps of 10-15 steps forward and backward with GTB

## 2017-11-10 ENCOUNTER — Other Ambulatory Visit: Payer: Self-pay | Admitting: Family Medicine

## 2017-11-10 NOTE — Telephone Encounter (Signed)
Last seen 10/17/2017

## 2017-11-10 NOTE — Telephone Encounter (Signed)
This +5 refills 

## 2017-11-14 ENCOUNTER — Encounter (HOSPITAL_COMMUNITY): Payer: Self-pay

## 2017-11-14 ENCOUNTER — Ambulatory Visit (HOSPITAL_COMMUNITY): Payer: Medicare Other

## 2017-11-14 DIAGNOSIS — M6281 Muscle weakness (generalized): Secondary | ICD-10-CM

## 2017-11-14 DIAGNOSIS — R29898 Other symptoms and signs involving the musculoskeletal system: Secondary | ICD-10-CM

## 2017-11-14 DIAGNOSIS — G8929 Other chronic pain: Secondary | ICD-10-CM

## 2017-11-14 DIAGNOSIS — M79604 Pain in right leg: Secondary | ICD-10-CM | POA: Diagnosis not present

## 2017-11-14 DIAGNOSIS — M545 Low back pain: Secondary | ICD-10-CM

## 2017-11-14 NOTE — Therapy (Signed)
[place], [place], [address] Phone: [phone]   Fax:  [phone]  Physical Therapy Treatment  Patient Details  Name: Christie [name] MRN: [id number] Date of Birth: 05/15/1946 Referring Provider: Sallee Lange, MD   Encounter Date: 11/14/2017  PT End of Session - 11/14/17 1517    Visit Number  14    Number of Visits  17    Date for PT Re-Evaluation  11/22/17    Authorization Type  [employer] Medicare; gcode complete 11/7, visit #11    Authorization Time Period  09/13/17 to 10/25/17; NEW: 10/25/17 to 11/22/17    Authorization - Visit Number  14    Authorization - Number of Visits  17    PT Start Time  7681    PT Stop Time  1601    PT Time Calculation (min)  44 min    Activity Tolerance  Patient tolerated treatment well    Behavior During Therapy  [facility] for tasks assessed/performed       Past Medical History:  Diagnosis Date  . Cancer [facility]) 1999   external vulva lesion  . Depression   . DM (diabetes mellitus) ([place]) 2012   oral medications  . Elevated liver enzymes   . History of kidney stones   . Hyperlipidemia   . Hypertension 2000  . Spastic colon     Past Surgical History:  Procedure Laterality Date  . ANTERIOR LAT LUMBAR FUSION Right 07/04/2017   Procedure: Right LUMBAR TWO-THREE LUMBAR THREE-FOUR LUMBAR FOUR-FIVE Anterior lateral lumbar interbody fusion with percutaneous pedicle screws;  Surgeon: Erline Levine, MD;  Location: [place];  Service: Neurosurgery;  Laterality: Right;  Right lateral approach   . ANTERIOR LATERAL LUMBAR FUSION WITH PERCUTANEOUS SCREW 3 LEVEL N/A 07/04/2017   Procedure: PERCUTANEOUS SCREW THREE LEVEL LUMBAR TWO-THREE LUMBAR THREE-FOUR LUMBAR FOUR-FIVE;  Surgeon: Erline Levine, MD;  Location: [place];  Service: Neurosurgery;  Laterality: N/A;  Prone  . BACK SURGERY  2004 and 2017  . CHOLECYSTECTOMY    . COLONOSCOPY  feb 2005  . COLONOSCOPY N/A 02/13/2014   Procedure: COLONOSCOPY;  Surgeon:  Rogene Houston, MD;  [facility];  Service: Endoscopy;  Laterality: N/A;  930-moved to [address] notified pt  . ESOPHAGOGASTRODUODENOSCOPY  09/2005  . EYE SURGERY Right    catatract removed  . EYE SURGERY Right    macular   surgery   . KIDNEY STONE SURGERY     for a staghorn kidney stone  . STAPEDECTOMY Bilateral    Right 1991 and Left 1994  . TOTAL ABDOMINAL HYSTERECTOMY  1982  . vulva cancer surgery  1999    There were no vitals filed for this visit.  Subjective Assessment - 11/14/17 1517    Subjective  Pt reports feeling under the weather today. She also reports that her back and leg are hurting some this afternoon.     Patient Stated Goals  get some strength back in legs    Currently in Pain?  Yes    Pain Score  2     Pain Location  Back and RLE    Pain Orientation  Right    Pain Descriptors / Indicators  Aching    Pain Type  Chronic pain    Pain Onset  More than a month ago    Pain Frequency  Constant    Aggravating Factors   sitting, standing, walking too long    Pain Relieving Factors  pain meds    Effect of Pain on Daily Activities  increases           [facility] Adult PT Treatment/Exercise - 11/14/17 0001      Lumbar Exercises: Stretches   Standing Extension Limitations  manual adductor stretching 5x15-30" each      Lumbar Exercises: Standing   Scapular Retraction Limitations  hip hikes on 2" step x20 BLE    Row Limitations  step ups on 6" step with contralateral knee drive L49 reps each    Other Standing Lumbar Exercises  deadlifts from floor with 10# DB x10 reps    Other Standing Lumbar Exercises  star drill on foam x3RT each; bil SLS onfoam and OH press with 5# DB x10 reps each      Lumbar Exercises: Supine   Other Supine Lumbar Exercises  unilateral deadbugs with greeb physioball 2x15 reps each (1set set with foot on table, 2nd set with foot elevated)      Lumbar Exercises: Quadruped   Other Quadruped Lumbar Exercises  birddogs x10 reps each       Manual Therapy   Manual Therapy  Myofascial release    Manual therapy comments  completed separate rest of treatment    Soft tissue mobilization  instrument assisted STM to R adductors, VMO, distal HS    Myofascial Release  Hooklying position MFR to medial distal HS, distal ADD and middle-distal VMO (significant trigger points and pain to palpation)           PT Education - 11/14/17 1520    Education provided  Yes    Education Details  exercise technique, will reassess next visit    Person(s) Educated  Patient    Methods  Explanation;Demonstration    Comprehension  Verbalized understanding;Returned demonstration       PT Short Term Goals - 10/25/17 1525      PT SHORT TERM GOAL #1   Title  Pt will be independent with HEP and perform consistently to maximzie return to PLOF.    Baseline  11/7: pt states that she doesn't like to do her exercises; but she is "doing some"    Time  3    Period  Weeks    Status  On-going      PT SHORT TERM GOAL #2   Title  Pt will have improved MMT of all muscle groups tested of at least 1/2 grade to decrease pain and maximize gait and balance.     Baseline  No pain noted, however, continue mm weakness proximal hip mm     Time  3    Period  Weeks    Status  Partially Met      PT SHORT TERM GOAL #3   Title  Pt will be able to perform SLS on BLE for at least 10 sec with min to no unsteadiness to maximize gait on uneven ground.    Baseline  SLS Rt. 3 sec, Lt. 5 sec - 3 trials each.     Time  3    Period  Weeks    Status  Achieved        PT Long Term Goals - 10/25/17 1525      PT LONG TERM GOAL #1   Title  Pt will have improved MMT of all muscle groups tested by at least 1 grade to maximze gait and her ability to ambulate stairs with confidence.    Time  6    Period  Weeks  Status  Partially Met      PT LONG TERM GOAL #2   Title  Pt will have an improved tolerance to standing and walking by at least 50% (at least 30 mins for standing  and 1 hour for walking) without exacerbations in RLE pain to maximize her ability to perform chores at home.     Baseline  11/7: 20-25 mins for standing; 15 mins for walking; still limited due to RLE pain; her chores have gotten easier at home though    Time  6    Period  Weeks    Status  On-going      PT LONG TERM GOAL #3   Title  Pt will be able to perform 3MWT maintaining at least 1.45ms gait speed and without exacerbations in RLE pain to demo improved tolerance to ambulation and maximize her function in the community.     Baseline  11/7: 5154f maintaining 1.51m81m with decreased pain following/no increases    Time  6    Period  Weeks    Status  Achieved            Plan - 11/14/17 1559    Clinical Impression Statement  Began session with MFR and instrument assisted STM to R quad, distal HS and medial-distal adductors. Rest of session focused on core and gluteal strengthening as well as dynamic hip stability. She required cues for form and had increased fatigue at EOS, but overall did well with therex. Pt due for reassessment and likely d/c next visit.    Rehab Potential  Fair    PT Frequency  1x / week    PT Duration  4 weeks correction from reassessment on 10/25/17    PT Treatment/Interventions  ADLs/Self Care Home Management;Aquatic Therapy;Cryotherapy;Electrical Stimulation;Moist Heat;DME Instruction;Gait training;Stair training;Functional mobility training;Therapeutic activities;Therapeutic exercise;Balance training;Neuromuscular re-education;Patient/family education;Manual techniques;Passive range of motion;Dry needling;Taping    PT Next Visit Plan  reassess, update HEP if needed    PT Home Exercise Plan  eval: sit <> stands, standing hip abd with RTB; 9/28: bridging with ankle DF, clams with RTB; 10/5: sidestep with RTB; 10/19; modified adductor stretch to standing; 10/24: SLR, s/l hip abd, piriformis stretch; 10/26: SLS, SLS with vectors; 11/7: fwd/lat step ups; 11/15: supine HS  stretch with ER (hands behind knee), prone quad stretch; 11/20: monster walks with GTB    Consulted and Agree with Plan of Care  Patient       Patient will benefit from skilled therapeutic intervention in order to improve the following deficits and impairments:  Abnormal gait, Decreased activity tolerance, Decreased balance, Decreased endurance, Decreased mobility, Decreased range of motion, Decreased strength, Difficulty walking, Increased muscle spasms, Improper body mechanics, Impaired flexibility, Postural dysfunction, Pain  Visit Diagnosis: Pain in right leg  Muscle weakness (generalized)  Other symptoms and signs involving the musculoskeletal system  Chronic bilateral low back pain without sciatica     Problem List Patient Active Problem List   Diagnosis Date Noted  . Lumbar spine scoliosis 07/04/2017  . Aortic stenosis, mild 11/14/2016  . Elevated alkaline phosphatase level 05/17/2015  . Urethral diverticulum 10/14/2014  . Lichen sclerosus et atrophicus 10/14/2014  . Poorly controlled type 2 diabetes mellitus ([place]/11/2014  . Insomnia 09/29/2014  . Other malaise and fatigue 04/04/2014  . Depression 04/04/2014  . Essential hypertension, benign 08/13/2013  . Hyperlipidemia 08/13/2013  . History of kidney stones 08/13/2013  . Elevated liver enzymes 08/13/2013  . Vulvar cancer ([place]/25/2014  Geraldine Solar PT, [place] [address] [place], [place], [address] Phone: [phone]   Fax:  [phone]  Name: Christie [name] MRN: [id number] Date of Birth: [birth year]

## 2017-11-21 ENCOUNTER — Ambulatory Visit (HOSPITAL_COMMUNITY): Payer: Medicare Other | Attending: Neurosurgery

## 2017-11-21 ENCOUNTER — Encounter (HOSPITAL_COMMUNITY): Payer: Self-pay

## 2017-11-21 ENCOUNTER — Other Ambulatory Visit: Payer: Self-pay

## 2017-11-21 DIAGNOSIS — M545 Low back pain: Secondary | ICD-10-CM | POA: Diagnosis present

## 2017-11-21 DIAGNOSIS — G8929 Other chronic pain: Secondary | ICD-10-CM | POA: Diagnosis present

## 2017-11-21 DIAGNOSIS — R29898 Other symptoms and signs involving the musculoskeletal system: Secondary | ICD-10-CM

## 2017-11-21 DIAGNOSIS — M79604 Pain in right leg: Secondary | ICD-10-CM | POA: Diagnosis present

## 2017-11-21 DIAGNOSIS — M6281 Muscle weakness (generalized): Secondary | ICD-10-CM | POA: Diagnosis present

## 2017-11-21 NOTE — Therapy (Signed)
[place], [place], [address] Phone: [phone]   Fax:  [phone]  Physical Therapy Treatment/Discharge Summary  Patient Details  Name: Christie [name] MRN: [id number] Date of Birth: 01/23/1946 Referring Provider: Sallee Lange, MD   Encounter Date: 11/21/2017  PT End of Session - 11/21/17 1434    Visit Number  15    Number of Visits  17    Date for PT Re-Evaluation  11/22/17    Authorization Type  [employer] Medicare; gcode complete 11/7, visit #11    Authorization Time Period  09/13/17 to 10/25/17; NEW: 10/25/17 to 11/22/17    Authorization - Visit Number  15    Authorization - Number of Visits  17    PT Start Time  3546    PT Stop Time  1459    PT Time Calculation (min)  27 min    Activity Tolerance  Patient tolerated treatment well    Behavior During Therapy  [facility] for tasks assessed/performed       Past Medical History:  Diagnosis Date  . Cancer [facility]) 1999   external vulva lesion  . Depression   . DM (diabetes mellitus) ([place]) 2012   oral medications  . Elevated liver enzymes   . History of kidney stones   . Hyperlipidemia   . Hypertension 2000  . Spastic colon     Past Surgical History:  Procedure Laterality Date  . ANTERIOR LAT LUMBAR FUSION Right 07/04/2017   Procedure: Right LUMBAR TWO-THREE LUMBAR THREE-FOUR LUMBAR FOUR-FIVE Anterior lateral lumbar interbody fusion with percutaneous pedicle screws;  Surgeon: Erline Levine, MD;  Location: [place];  Service: Neurosurgery;  Laterality: Right;  Right lateral approach   . ANTERIOR LATERAL LUMBAR FUSION WITH PERCUTANEOUS SCREW 3 LEVEL N/A 07/04/2017   Procedure: PERCUTANEOUS SCREW THREE LEVEL LUMBAR TWO-THREE LUMBAR THREE-FOUR LUMBAR FOUR-FIVE;  Surgeon: Erline Levine, MD;  Location: [place];  Service: Neurosurgery;  Laterality: N/A;  Prone  . BACK SURGERY  2004 and 2017  . CHOLECYSTECTOMY    . COLONOSCOPY  feb 2005  . COLONOSCOPY N/A 02/13/2014   Procedure:  COLONOSCOPY;  Surgeon: Rogene Houston, MD;  [facility];  Service: Endoscopy;  Laterality: N/A;  930-moved to 100 Ann notified pt  . ESOPHAGOGASTRODUODENOSCOPY  09/2005  . EYE SURGERY Right    catatract removed  . EYE SURGERY Right    macular   surgery   . KIDNEY STONE SURGERY     for a staghorn kidney stone  . STAPEDECTOMY Bilateral    Right 1991 and Left 1994  . TOTAL ABDOMINAL HYSTERECTOMY  1982  . vulva cancer surgery  1999    There were no vitals filed for this visit.  Subjective Assessment - 11/21/17 1434    Subjective  Pt states that her back hurts all the time and her RLE is still bothering her, especially on the inside.     Patient Stated Goals  get some strength back in legs    Currently in Pain?  Yes    Pain Score  3     Pain Location  Back RLE     Pain Orientation  Right    Pain Descriptors / Indicators  Aching    Pain Type  Chronic pain    Pain Onset  More than a month ago    Pain Frequency  Constant    Aggravating Factors   sitting, standing, walking too long    Pain Relieving Factors  pain meds    Effect of Pain on Daily Activities  increases         [facility] PT Assessment - 11/21/17 0001      Strength   Right Hip Flexion  4+/5 was 4 on 9/26    Right Hip Extension  4/5 was 2+ on 9/26    Right Hip ABduction  4/5 was 4- on 9/26    Left Hip Flexion  4+/5 was 4+ on 9/26    Left Hip Extension  4+/5 was 4+ on 9/26    Left Hip ABduction  4/5 was 3+ on 9/26    Left Ankle Dorsiflexion  5/5 was 4+ on 9/26      Balance   Balance Assessed  Yes      Static Standing Balance   Static Standing - Balance Support  No upper extremity supported    Static Standing Balance -  Activities   Single Leg Stance - Right Leg;Single Leg Stance - Left Leg    Static Standing - Comment/# of Minutes  R: 15 sec, L: 6 sec or <; multiple trials for BLE          PT Education - 11/21/17 1436    Education provided  Yes    Education Details  discharge plans, HEP update and  education to stretch every day, and strength every other day    Person(s) Educated  Patient    Methods  Explanation    Comprehension  Verbalized understanding       PT Short Term Goals - 11/21/17 1436      PT SHORT TERM GOAL #1   Title  Pt will be independent with HEP and perform consistently to maximzie return to PLOF.    Baseline  12/4: pt performs HEP 3x/week    Time  3    Period  Weeks    Status  Partially Met      PT SHORT TERM GOAL #2   Title  Pt will have improved MMT of all muscle groups tested of at least 1/2 grade to decrease pain and maximize gait and balance.     Baseline  12/4: proximal hip strength still deficient or no change    Time  3    Period  Weeks    Status  Partially Met      PT SHORT TERM GOAL #3   Title  Pt will be able to perform SLS on BLE for at least 10 sec with min to no unsteadiness to maximize gait on uneven ground.    Baseline  12/4: multiple trials of SLS; R: 15 sec, L: 6 sec or <    Time  3    Period  Weeks    Status  Partially Met        PT Long Term Goals - 11/21/17 1437      PT LONG TERM GOAL #1   Title  Pt will have improved MMT of all muscle groups tested by at least 1 grade to maximze gait and her ability to ambulate stairs with confidence.    Time  6    Period  Weeks    Status  Partially Met      PT LONG TERM GOAL #2   Title  Pt will have an improved tolerance to standing and walking by at least 50% (at least 30 mins for standing and 1 hour for walking) without exacerbations in RLE pain to maximize her ability to perform chores at home.  Baseline  12-10-23: 20-25 mins for standing; 15 mins for walking; not pain limiting, weakness is limiting factor; her chores are easier to perform now    Time  6    Period  Weeks    Status  Partially Met      PT LONG TERM GOAL #3   Title  Pt will be able to perform 3MWT maintaining at least 1.29ms gait speed and without exacerbations in RLE pain to demo improved tolerance to ambulation and  maximize her function in the community.     Baseline  11/7: 719f maintaining 1.57m56m with decreased pain following/no increases    Time  6    Period  Weeks    Status  Achieved            Plan - 12/12-22-1800    Clinical Impression Statement  PT reassessed pt's goals and outcome measures. Pt has made minimal improvements from last reassessment AEB min to no progress towards her goals. Pt continues to demo decreased proximal hip strength and continues to c/o LBP and RLE pain. Due to lack of progress, pt will be discharged to her HEP. Pt was given updated HEP and was educated to stretch and walk every day and to strengthen every other day; she verbalized understanding.    Rehab Potential  Fair    PT Frequency  1x / week    PT Duration  4 weeks correction from reassessment on 10/25/17    PT Treatment/Interventions  ADLs/Self Care Home Management;Aquatic Therapy;Cryotherapy;Electrical Stimulation;Moist Heat;DME Instruction;Gait training;Stair training;Functional mobility training;Therapeutic activities;Therapeutic exercise;Balance training;Neuromuscular re-education;Patient/family education;Manual techniques;Passive range of motion;Dry needling;Taping    PT Next Visit Plan  discharged    PT Home Exercise Plan  eval: sit <> stands, standing hip abd with RTB; 9/28: bridging with ankle DF, clams with RTB; 10/5: sidestep with RTB; 10/19; modified adductor stretch to standing; 10/24: SLR, s/l hip abd, piriformis stretch; 10/26: SLS, SLS with vectors; 11/7: fwd/lat step ups; 11/15: supine HS stretch with ER (hands behind knee), prone quad stretch; 11/20: monster walks with GTB; 12/12-22-2024alking program, deadlifts, deadbugs    Consulted and Agree with Plan of Care  Patient       Patient will benefit from skilled therapeutic intervention in order to improve the following deficits and impairments:  Abnormal gait, Decreased activity tolerance, Decreased balance, Decreased endurance, Decreased mobility,  Decreased range of motion, Decreased strength, Difficulty walking, Increased muscle spasms, Improper body mechanics, Impaired flexibility, Postural dysfunction, Pain  Visit Diagnosis: Pain in right leg  Muscle weakness (generalized)  Other symptoms and signs involving the musculoskeletal system  Chronic bilateral low back pain without sciatica   G-Codes - 12/22-Dec-201804    Functional Assessment Tool Used (Outpatient Only)  clinical judgement, 5xSTS, 3MWT, SLS, MMT    Functional Limitation  Mobility: Walking and moving around    Mobility: Walking and Moving Around Goal Status ([id number] least 1 percent but less than 20 percent impaired, limited or restricted    Mobility: Walking and Moving Around Discharge Status ([id number] least 20 percent but less than 40 percent impaired, limited or restricted       Problem List Patient Active Problem List   Diagnosis Date Noted  . Lumbar spine scoliosis 07/04/2017  . Aortic stenosis, mild 11/14/2016  . Elevated alkaline phosphatase level 05/17/2015  . Urethral diverticulum 10/14/2014  . Lichen sclerosus et atrophicus 10/14/2014  . Poorly controlled type 2 diabetes mellitus ([place]/11/2014  . Insomnia 09/29/2014  .  Other malaise and fatigue 04/04/2014  . Depression 04/04/2014  . Essential hypertension, benign 08/13/2013  . Hyperlipidemia 08/13/2013  . History of kidney stones 08/13/2013  . Elevated liver enzymes 08/13/2013  . Vulvar cancer ([place]) 04/12/2013      PHYSICAL THERAPY DISCHARGE SUMMARY  Visits from Start of Care: 15  Current functional level related to goals / functional outcomes: See clinical impression above   Remaining deficits: See clinical impression above   Education / Equipment: HEP Plan: Patient agrees to discharge.  Patient goals were partially met. Patient is being discharged due to lack of progress.  ?????       Geraldine Solar PT, [place] [address] [place], [place], [address] Phone: [phone]   Fax:  [phone]  Name: Christie [name] MRN: [id number] Date of Birth: [birth year]

## 2017-11-21 NOTE — Patient Instructions (Signed)
  WALKING  Start a walking program.  Try for 10-15 mins/day, or more if you can tolerate it   [place] Deadlift (RDL)  1) Begin in tall standing position with a slight bend in both knees 2) Engage your core by lightly bringing belly button closer to the spine to maintain neutral lumbar spine 3) Begin to hinge at the hips while you maintain a neutral spine. The first movement should be backwards as if someone were pulling your weight/hips backwards.  4) Once you feel stretch in the hamstrings, begin to drive through your heels to bring your hips forward and shoulders back to starting position.  5) If you are using weight, keep the weight close to your body. Your shins should stay vertical and the knees should maintain the same amount of bend.  6) Complete for assigned repetitions 7) It is important to not allow the lumbar spine to hyperextend or flex, it should maintain neutral position!  Can use a gallon of milk or a book if you don't have a 10-ish # weight    Deadbug  Lie in the 3 month position, flatten your spine to the floor, tucking your chin to elongate your neck. Keep your knees bent and your elbows straight but relaxed.  Lower one heel to the floor, and raise one hand above your head. Repeat with the opposite limbs.  The challenge is to keep everything immobile except the moving limbs.   Progress by anchoring red or green band in a closed door - grab band with hands and pull band towards the middle to meet your opposite knee.    Again, walk and stretch every day, strengthen every other day. Pick 5 strengthening exercises to do one day, and then the next day you strengthen, do a different round of 5 exercises

## 2017-12-21 ENCOUNTER — Other Ambulatory Visit: Payer: Self-pay | Admitting: Family Medicine

## 2017-12-25 ENCOUNTER — Encounter: Payer: Self-pay | Admitting: Family Medicine

## 2017-12-25 ENCOUNTER — Ambulatory Visit: Payer: Medicare Other | Admitting: Family Medicine

## 2017-12-25 VITALS — BP 136/72 | Ht 63.5 in | Wt 166.0 lb

## 2017-12-25 DIAGNOSIS — R7309 Other abnormal glucose: Secondary | ICD-10-CM | POA: Diagnosis not present

## 2017-12-25 DIAGNOSIS — Z79899 Other long term (current) drug therapy: Secondary | ICD-10-CM | POA: Diagnosis not present

## 2017-12-25 DIAGNOSIS — I10 Essential (primary) hypertension: Secondary | ICD-10-CM | POA: Diagnosis not present

## 2017-12-25 DIAGNOSIS — E785 Hyperlipidemia, unspecified: Secondary | ICD-10-CM

## 2017-12-25 DIAGNOSIS — E1169 Type 2 diabetes mellitus with other specified complication: Secondary | ICD-10-CM

## 2017-12-25 NOTE — Progress Notes (Signed)
   Subjective:    Patient ID: Christie [name], female    DOB: [birth year], 72 y.o.   MRN: [id number]  HPI Patient is here today to discuss medications and wanted to discuss starting prevogen for memory.Also wants to discuss all of her supplements. The patient is concerned that she may develop dementia she denies getting lost denies repeating herself denies forgetting things states her overall energy level is doing okay she is trying to keep her diabetes under control blood pressure under control and cholesterol under control she has multiple different supplements that she is taking I reviewed over these today 15 minutes spent with patient greater than half in discussion of multiple issues and answering questions and counseling Family history dementia Review of Systems    Denies chest tightness pressure pain shortness of breath o Objective:   Physical Exam Lungs clear heart regular pulse normal       Assessment & Plan:  I believe the patient is taking too many supplements Stop probiotic She should question her ENT if taking calcium is really necesesary Memory medicine is not beneficial.stop Prevogen Due memory questionnaire Do labs in mid March Send questionnaire back sooner Follow-up March/April

## 2018-01-18 ENCOUNTER — Telehealth: Payer: Self-pay | Admitting: Family Medicine

## 2018-01-18 NOTE — Telephone Encounter (Signed)
Pt dropped off neurology memory questionnaire. In red folder in office.

## 2018-01-22 ENCOUNTER — Other Ambulatory Visit: Payer: Self-pay | Admitting: Family Medicine

## 2018-01-22 NOTE — Telephone Encounter (Signed)
Please let patient know that I did review over her memory questionnaire.  I do not feel she has Alzheimer's.  Let the patient know that we will do some cognitive testing when she follows up in April

## 2018-01-23 NOTE — Telephone Encounter (Signed)
Patient is aware 

## 2018-02-02 ENCOUNTER — Ambulatory Visit (INDEPENDENT_AMBULATORY_CARE_PROVIDER_SITE_OTHER): Payer: Medicare Other | Admitting: Ophthalmology

## 2018-02-02 DIAGNOSIS — H35033 Hypertensive retinopathy, bilateral: Secondary | ICD-10-CM

## 2018-02-02 DIAGNOSIS — I10 Essential (primary) hypertension: Secondary | ICD-10-CM

## 2018-02-02 DIAGNOSIS — H35341 Macular cyst, hole, or pseudohole, right eye: Secondary | ICD-10-CM | POA: Diagnosis not present

## 2018-02-02 DIAGNOSIS — H43812 Vitreous degeneration, left eye: Secondary | ICD-10-CM | POA: Diagnosis not present

## 2018-02-02 LAB — HM DIABETES EYE EXAM

## 2018-02-06 ENCOUNTER — Other Ambulatory Visit: Payer: Self-pay | Admitting: Family Medicine

## 2018-02-07 ENCOUNTER — Encounter: Payer: Self-pay | Admitting: *Deleted

## 2018-02-09 ENCOUNTER — Other Ambulatory Visit: Payer: Self-pay | Admitting: Family Medicine

## 2018-02-25 IMAGING — RF DG C-ARM GT 120 MIN
1 series · 3 of 3 positions shown · non-contrast
Comparison: 04/06/2017 MR.

CLINICAL DATA: 71-year-old female post fusion L2 through L5.
Subsequent encounter.

EXAM:
DG C-ARM GT 120 MIN; LUMBAR SPINE - 2-3 VIEW

[Series 1: run · 3 of 3 slices shown]
[im 1/3]
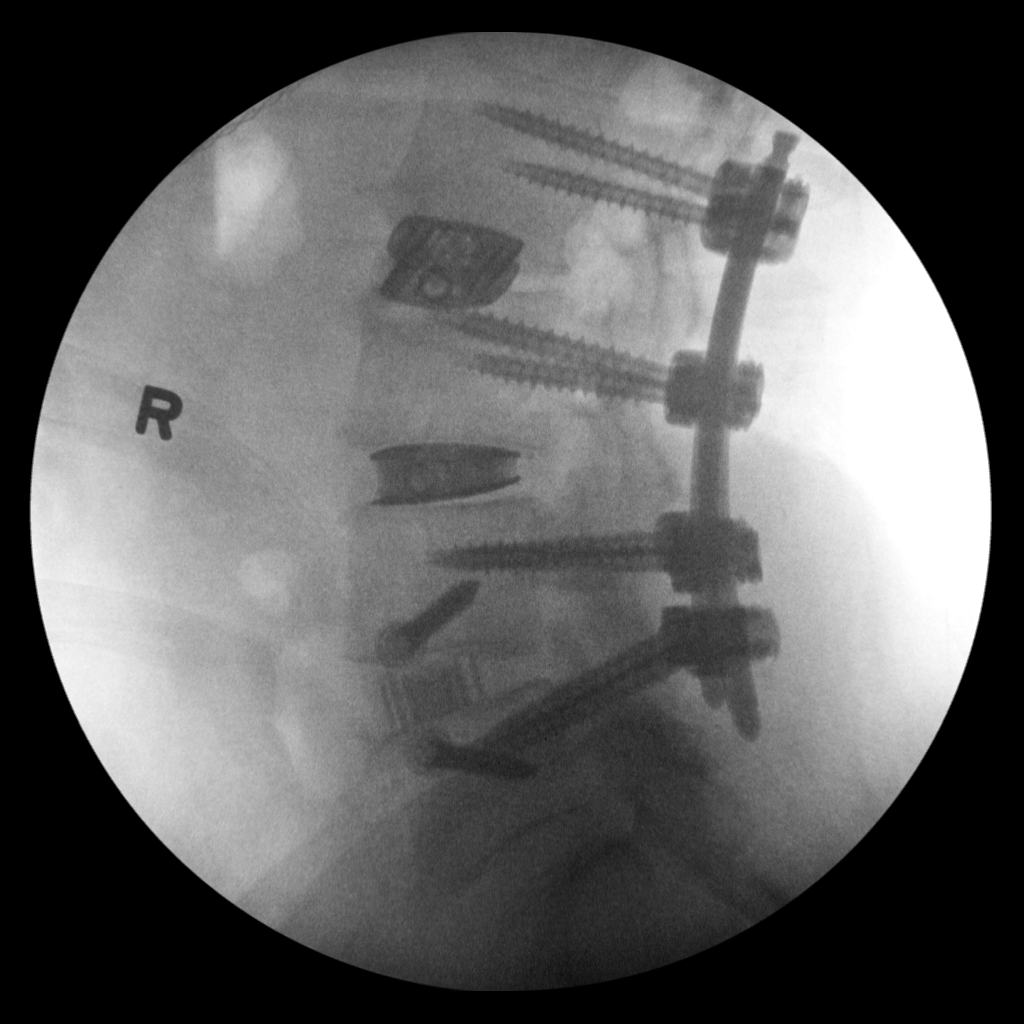
[im 2/3]
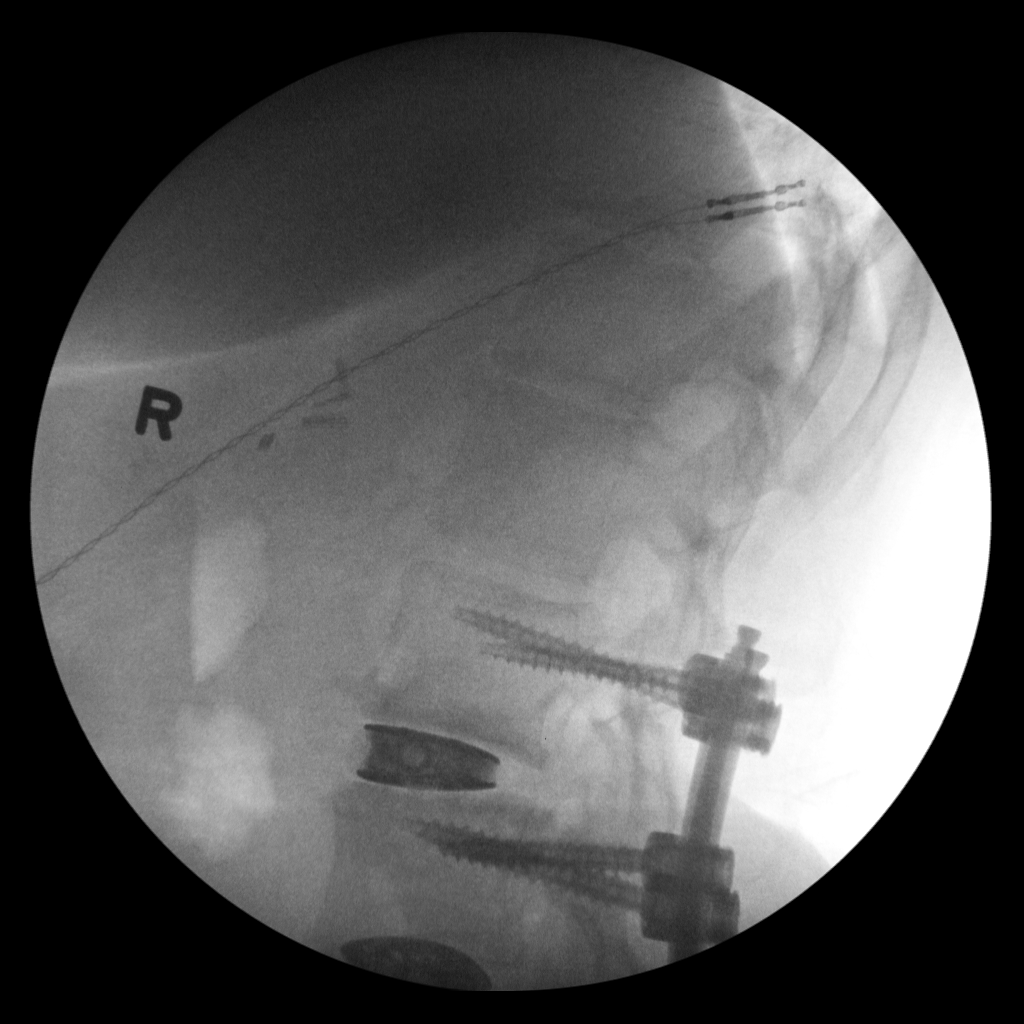
[im 3/3]
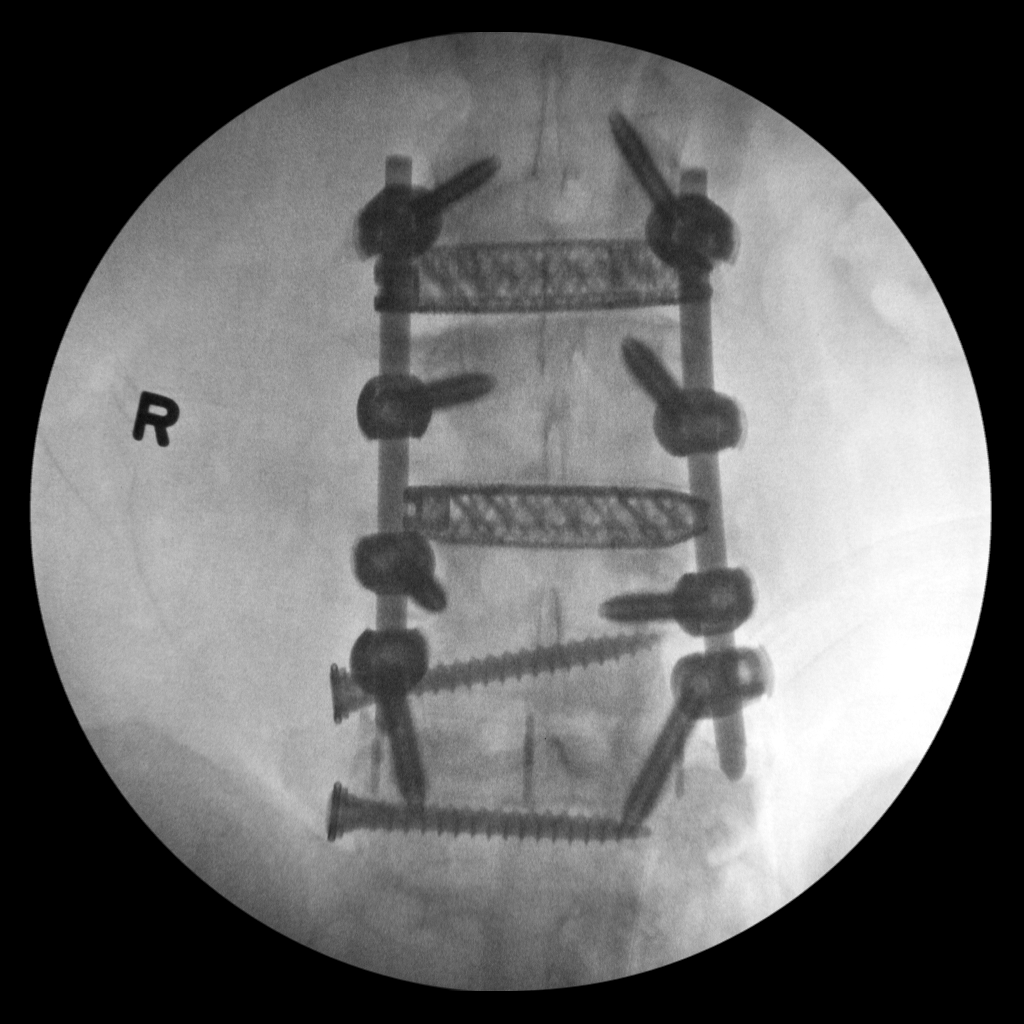

[3 of 3 positions shown; findings below may reference images not displayed]

FINDINGS: Three intraoperative C-arm views submitted for review after surgery.

Post fusion with bilateral pedicle screws, posterior connecting bar
and interbody spacer L2-3, L3-4 and L4-5. No obvious complication.
Pedicle screw placement can be assessed on follow-up. L4-5 interbody
spacer placed from right lateral position.
IMPRESSION: Fusion L2-L5.

## 2018-03-09 ENCOUNTER — Other Ambulatory Visit: Payer: Self-pay | Admitting: Family Medicine

## 2018-03-15 ENCOUNTER — Encounter: Payer: Self-pay | Admitting: Family Medicine

## 2018-03-15 LAB — LIPID PANEL
Chol/HDL Ratio: 2.3 ratio (ref 0.0–4.4)
Cholesterol, Total: 158 mg/dL (ref 100–199)
HDL: 68 mg/dL (ref >39)
LDL Calculated: 66 mg/dL (ref 0–99)
Triglycerides: 119 mg/dL (ref 0–149)
VLDL CHOLESTEROL CAL: 24 mg/dL (ref 5–40)

## 2018-03-15 LAB — HEMOGLOBIN A1C
ESTIMATED AVERAGE GLUCOSE: 140 mg/dL
Hgb A1c MFr Bld: 6.5 % — ABNORMAL HIGH (ref 4.8–5.6)

## 2018-03-15 LAB — HEPATIC FUNCTION PANEL
ALBUMIN: 4.6 g/dL (ref 3.5–4.8)
ALK PHOS: 110 IU/L (ref 39–117)
ALT: 28 IU/L (ref 0–32)
AST: 27 IU/L (ref 0–40)
BILIRUBIN, DIRECT: 0.21 mg/dL (ref 0.00–0.40)
Bilirubin Total: 0.7 mg/dL (ref 0.0–1.2)
TOTAL PROTEIN: 6.7 g/dL (ref 6.0–8.5)

## 2018-03-15 LAB — BASIC METABOLIC PANEL
BUN/Creatinine Ratio: 31 — ABNORMAL HIGH (ref 12–28)
BUN: 19 mg/dL (ref 8–27)
CO2: 25 mmol/L (ref 20–29)
CREATININE: 0.61 mg/dL (ref 0.57–1.00)
Calcium: 10 mg/dL (ref 8.7–10.3)
Chloride: 102 mmol/L (ref 96–106)
GFR calc Af Amer: 104 mL/min/{1.73_m2} (ref >59)
GFR, EST NON AFRICAN AMERICAN: 90 mL/min/{1.73_m2} (ref >59)
GLUCOSE: 105 mg/dL — AB (ref 65–99)
Potassium: 3.7 mmol/L (ref 3.5–5.2)
SODIUM: 143 mmol/L (ref 134–144)

## 2018-03-22 ENCOUNTER — Other Ambulatory Visit: Payer: Self-pay | Admitting: Family Medicine

## 2018-04-18 ENCOUNTER — Other Ambulatory Visit: Payer: Self-pay | Admitting: Family Medicine

## 2018-05-17 ENCOUNTER — Other Ambulatory Visit: Payer: Self-pay | Admitting: Family Medicine

## 2018-05-21 ENCOUNTER — Other Ambulatory Visit: Payer: Self-pay | Admitting: Family Medicine

## 2018-05-29 ENCOUNTER — Other Ambulatory Visit: Payer: Self-pay | Admitting: Family Medicine

## 2018-05-29 NOTE — Telephone Encounter (Signed)
They have 4 refills on these

## 2018-06-16 ENCOUNTER — Other Ambulatory Visit: Payer: Self-pay | Admitting: Family Medicine

## 2018-07-18 ENCOUNTER — Ambulatory Visit (INDEPENDENT_AMBULATORY_CARE_PROVIDER_SITE_OTHER): Payer: Medicare Other | Admitting: Family Medicine

## 2018-07-18 ENCOUNTER — Encounter: Payer: Self-pay | Admitting: Family Medicine

## 2018-07-18 VITALS — BP 136/70 | Ht 63.5 in | Wt 171.0 lb

## 2018-07-18 DIAGNOSIS — Z124 Encounter for screening for malignant neoplasm of cervix: Secondary | ICD-10-CM | POA: Diagnosis not present

## 2018-07-18 DIAGNOSIS — Z0000 Encounter for general adult medical examination without abnormal findings: Secondary | ICD-10-CM

## 2018-07-18 MED ORDER — INDAPAMIDE 1.25 MG PO TABS: 1.2500 mg | ORAL_TABLET | Freq: Every day | ORAL | 5 refills | Status: DC

## 2018-07-18 MED ORDER — ZOSTER VAC RECOMB ADJUVANTED 50 MCG/0.5ML IM SUSR: 0.5000 mL | Freq: Once | INTRAMUSCULAR | 0 refills | Status: AC

## 2018-07-18 MED ORDER — ROSUVASTATIN CALCIUM 20 MG PO TABS: 20.0000 mg | ORAL_TABLET | Freq: Every day | ORAL | 5 refills | Status: DC

## 2018-07-18 MED ORDER — EMPAGLIFLOZIN 10 MG PO TABS: 10.0000 mg | ORAL_TABLET | Freq: Every day | ORAL | 6 refills | Status: DC

## 2018-07-18 NOTE — Progress Notes (Signed)
   Subjective:    Patient ID: Christie [name], female    DOB: 22-Mar-1946, 72 y.o.   MRN: [id number]  HPI  AWV- Annual Wellness Visit Wellness exam Has history of vulvar cancer Patient denies any bleeding issues She keeps up-to-date on mammogram Up-to-date on colonoscopy Has diabetic follow-up this fall Will need lab work at that time Denies being depressed denies falls The patient was seen for their annual wellness visit. The patient's past medical history, surgical history, and family history were reviewed. Pertinent vaccines were reviewed ( tetanus, pneumonia, shingles, flu) The patient's medication list was reviewed and updated.  The height and weight were entered.  BMI recorded in electronic record elsewhere  Cognitive screening was completed. Outcome of Mini - Cog: Passed    Falls /depression screening electronically recorded within record elsewhere None  Current tobacco usage:No (All patients who use tobacco were given written and verbal information on quitting)  Recent listing of emergency department/hospitalizations over the past year were reviewed.  current specialist the patient sees on a regular basis: [name].Dr Erline [name]    Medicare annual wellness visit patient questionnaire was reviewed.  A written screening schedule for the patient for the next 5-10 years was given. Appropriate discussion of followup regarding next visit was discussed.  Walks for exercise.  Review of Systems  Constitutional: Negative for activity change, appetite change and fatigue.  HENT: Negative for congestion and rhinorrhea.   Respiratory: Negative for cough and shortness of breath.   Cardiovascular: Negative for chest pain and leg swelling.  Gastrointestinal: Negative for abdominal pain and diarrhea.  Endocrine: Negative for polydipsia and polyphagia.  Skin: Negative for color change.  Neurological: Negative for dizziness and weakness.  Psychiatric/Behavioral:  Negative for behavioral problems and confusion.       Objective:   Physical Exam  Constitutional: She appears well-nourished. No distress.  HENT:  Head: Normocephalic and atraumatic.  Eyes: Right eye exhibits no discharge. Left eye exhibits no discharge.  Neck: No tracheal deviation present.  Cardiovascular: Normal rate, regular rhythm and normal heart sounds.  No murmur heard. Pulmonary/Chest: Effort normal and breath sounds normal. No respiratory distress.  Musculoskeletal: She exhibits no edema.  Lymphadenopathy:    She has no cervical adenopathy.  Neurological: She is alert. Coordination normal.  Skin: Skin is warm and dry.  Psychiatric: She has a normal mood and affect. Her behavior is normal.  Vitals reviewed.  Breast exam bilateral normal Pelvic exam no abnormalities noted Benign skin tag persistent within the vagina       Assessment & Plan:  Pap smear taken Shin Grix recommended Lab work this fall Diabetic follow-up this fall Safety recommended Regular physical activity discussed Next colonoscopy 2025 Mammogram later this fall

## 2018-07-23 LAB — PAP IG W/ RFLX HPV ASCU: PAP SMEAR COMMENT: 0

## 2018-07-24 ENCOUNTER — Encounter: Payer: Self-pay | Admitting: Family Medicine

## 2018-08-08 ENCOUNTER — Other Ambulatory Visit: Payer: Self-pay | Admitting: Family Medicine

## 2018-09-03 ENCOUNTER — Ambulatory Visit: Payer: Medicare Other | Admitting: Family Medicine

## 2018-09-03 ENCOUNTER — Encounter: Payer: Self-pay | Admitting: Family Medicine

## 2018-09-03 VITALS — BP 140/64 | Temp 98.5°F | Ht 63.5 in | Wt 174.0 lb

## 2018-09-03 DIAGNOSIS — J019 Acute sinusitis, unspecified: Secondary | ICD-10-CM

## 2018-09-03 DIAGNOSIS — I10 Essential (primary) hypertension: Secondary | ICD-10-CM

## 2018-09-03 DIAGNOSIS — Z79899 Other long term (current) drug therapy: Secondary | ICD-10-CM

## 2018-09-03 DIAGNOSIS — J301 Allergic rhinitis due to pollen: Secondary | ICD-10-CM | POA: Diagnosis not present

## 2018-09-03 DIAGNOSIS — Z1159 Encounter for screening for other viral diseases: Secondary | ICD-10-CM

## 2018-09-03 DIAGNOSIS — E119 Type 2 diabetes mellitus without complications: Secondary | ICD-10-CM

## 2018-09-03 MED ORDER — AMOXICILLIN 500 MG PO TABS: 500.0000 mg | ORAL_TABLET | Freq: Three times a day (TID) | ORAL | 0 refills | Status: DC

## 2018-09-03 NOTE — Progress Notes (Signed)
   Subjective:    Patient ID: Christie [name], female    DOB: [birth year], 72 y.o.   MRN: [id number]  HPI Patient is here today with bilateral ear pain, sore throat,headache and some cough for the last two weeks. She has been taking Mucinex. Significant head congestion drainage coughing bilateral ear pain stuffiness allergy symptoms present over the past couple weeks worse over the past few days PMH diabetes  Review of Systems  Constitutional: Negative for activity change and fever.  HENT: Positive for congestion and rhinorrhea. Negative for ear pain.   Eyes: Negative for discharge.  Respiratory: Positive for cough. Negative for shortness of breath and wheezing.   Cardiovascular: Negative for chest pain.       Objective:   Physical Exam  Constitutional: She appears well-developed.  HENT:  Head: Normocephalic.  Nose: Nose normal.  Mouth/Throat: Oropharynx is clear and moist. No oropharyngeal exudate.  Neck: Neck supple.  Cardiovascular: Normal rate and normal heart sounds.  No murmur heard. Pulmonary/Chest: Effort normal and breath sounds normal. She has no wheezes.  Lymphadenopathy:    She has no cervical adenopathy.  Skin: Skin is warm and dry.  Nursing note and vitals reviewed.         Assessment & Plan:  Allergic rhinitis Sinusitis Antibiotic prescribed warning signs discussed Follow-up if progressive troubles  Patient has standard follow-up visit this fall lab work ordered for this

## 2018-09-06 ENCOUNTER — Other Ambulatory Visit: Payer: Self-pay | Admitting: Family Medicine

## 2018-09-14 ENCOUNTER — Other Ambulatory Visit: Payer: Self-pay | Admitting: Family Medicine

## 2018-09-20 LAB — BASIC METABOLIC PANEL
BUN/Creatinine Ratio: 28 (ref 12–28)
BUN: 17 mg/dL (ref 8–27)
CHLORIDE: 101 mmol/L (ref 96–106)
CO2: 26 mmol/L (ref 20–29)
CREATININE: 0.61 mg/dL (ref 0.57–1.00)
Calcium: 10.1 mg/dL (ref 8.7–10.3)
GFR calc Af Amer: 103 mL/min/{1.73_m2} (ref >59)
GFR calc non Af Amer: 90 mL/min/{1.73_m2} (ref >59)
Glucose: 119 mg/dL — ABNORMAL HIGH (ref 65–99)
Potassium: 3.8 mmol/L (ref 3.5–5.2)
Sodium: 141 mmol/L (ref 134–144)

## 2018-09-20 LAB — HEMOGLOBIN A1C
ESTIMATED AVERAGE GLUCOSE: 143 mg/dL
Hgb A1c MFr Bld: 6.6 % — ABNORMAL HIGH (ref 4.8–5.6)

## 2018-09-20 LAB — HEPATITIS C ANTIBODY: Hep C Virus Ab: <0.1 s/co ratio (ref 0.0–0.9)

## 2018-09-20 LAB — HEPATIC FUNCTION PANEL
ALBUMIN: 4.5 g/dL (ref 3.5–4.8)
ALT: 25 IU/L (ref 0–32)
AST: 25 IU/L (ref 0–40)
Alkaline Phosphatase: 98 IU/L (ref 39–117)
BILIRUBIN, DIRECT: 0.16 mg/dL (ref 0.00–0.40)
Bilirubin Total: 0.6 mg/dL (ref 0.0–1.2)
TOTAL PROTEIN: 6.7 g/dL (ref 6.0–8.5)

## 2018-09-20 LAB — MICROALBUMIN / CREATININE URINE RATIO
CREATININE, UR: 86.4 mg/dL
MICROALBUM., U, RANDOM: 5.1 ug/mL
Microalb/Creat Ratio: 5.9 mg/g creat (ref 0.0–30.0)

## 2018-09-24 ENCOUNTER — Encounter: Payer: Self-pay | Admitting: Family Medicine

## 2018-09-24 ENCOUNTER — Ambulatory Visit: Payer: Medicare Other | Admitting: Family Medicine

## 2018-09-24 VITALS — BP 132/68 | Ht 63.5 in | Wt 174.8 lb

## 2018-09-24 DIAGNOSIS — E785 Hyperlipidemia, unspecified: Secondary | ICD-10-CM | POA: Diagnosis not present

## 2018-09-24 DIAGNOSIS — E119 Type 2 diabetes mellitus without complications: Secondary | ICD-10-CM

## 2018-09-24 DIAGNOSIS — I10 Essential (primary) hypertension: Secondary | ICD-10-CM

## 2018-09-24 DIAGNOSIS — Z23 Encounter for immunization: Secondary | ICD-10-CM | POA: Diagnosis not present

## 2018-09-24 MED ORDER — ALPRAZOLAM 0.5 MG PO TABS: ORAL_TABLET | ORAL | 5 refills | Status: DC

## 2018-09-24 MED ORDER — GLIPIZIDE 5 MG PO TABS: ORAL_TABLET | ORAL | 2 refills | Status: DC

## 2018-09-24 MED ORDER — LOSARTAN POTASSIUM 100 MG PO TABS: ORAL_TABLET | ORAL | 5 refills | Status: DC

## 2018-09-24 MED ORDER — EMPAGLIFLOZIN 10 MG PO TABS: 10.0000 mg | ORAL_TABLET | Freq: Every day | ORAL | 6 refills | Status: DC

## 2018-09-24 NOTE — Progress Notes (Signed)
Subjective:    Patient ID: Christie [name], female    DOB: 09/28/1946, 72 y.o.   MRN: [id number]  Diabetes  She presents for her follow-up diabetic visit. She has type 2 diabetes mellitus. Pertinent negatives for hypoglycemia include no confusion or dizziness. Pertinent negatives for diabetes include no chest pain, no fatigue, no polydipsia, no polyphagia and no weakness. She is compliant with treatment all of the time. Home blood sugar record trend: today 109. She does not see a podiatrist.Eye exam is current (july 2019).  Patient for blood pressure check up.  The patient does have hypertension.  The patient is on medication.  Patient relates compliance with meds. Todays BP reviewed with the patient. Patient denies issues with medication. Patient relates reasonable diet. Patient tries to minimize salt. Patient aware of BP goals.  Patient here for follow-up regarding cholesterol.  The patient does have hyperlipidemia.  Patient does try to maintain a reasonable diet.  Patient does take the medication on a regular basis.  Denies missing a dose.  The patient denies any obvious side effects.  Prior blood work results reviewed with the patient.  The patient is aware of his cholesterol goals and the need to keep it under good control to lessen the risk of disease.  Pt states no concerns today.   Flu vaccine today.    Review of Systems  Constitutional: Negative for activity change, appetite change and fatigue.  HENT: Negative for congestion and rhinorrhea.   Respiratory: Negative for cough and shortness of breath.   Cardiovascular: Negative for chest pain and leg swelling.  Gastrointestinal: Negative for abdominal pain and diarrhea.  Endocrine: Negative for polydipsia and polyphagia.  Skin: Negative for color change.  Neurological: Negative for dizziness and weakness.  Psychiatric/Behavioral: Negative for behavioral problems and confusion.       Objective:   Physical Exam  Constitutional: She  appears well-nourished. No distress.  HENT:  Head: Normocephalic and atraumatic.  Eyes: Right eye exhibits no discharge. Left eye exhibits no discharge.  Neck: No tracheal deviation present.  Cardiovascular: Normal rate, regular rhythm and normal heart sounds.  No murmur heard. Pulmonary/Chest: Effort normal and breath sounds normal. No respiratory distress.  Musculoskeletal: She exhibits no edema.  Lymphadenopathy:    She has no cervical adenopathy.  Neurological: She is alert. Coordination normal.  Skin: Skin is warm and dry.  Psychiatric: She has a normal mood and affect. Her behavior is normal.  Vitals reviewed.         Assessment & Plan:  The patient was seen today as part of a comprehensive visit for diabetes. The importance of keeping her A1c at or below 7 was discussed.  Importance of regular physical activity was discussed.   The importance of adherence to medication as well as a controlled low starch/sugar diet was also discussed.  Standard follow-up visit recommended.  Also patient aware failure to keep diabetes under control increases the risk of complications.  HTN- Patient was seen today as part of a visit regarding hypertension. The importance of healthy diet and regular physical activity was discussed. The importance of compliance with medications discussed.  Ideal goal is to keep blood pressure low elevated levels certainly below 053/97 when possible.  The patient was counseled that keeping blood pressure under control lessen his risk of complications.  The importance of regular follow-ups was discussed with the patient.  Low-salt diet such as DASH recommended.  Regular physical activity was recommended as well.  Patient was advised  to keep regular follow-ups.  The patient was seen today as part of an evaluation regarding hyperlipidemia.  Recent lab work has been reviewed with the patient as well as the goals for good cholesterol care.  In addition to this  medications have been discussed the importance of compliance with diet and medications discussed as well.  Finally the patient is aware that poor control of cholesterol, noncompliance can dramatically increase the risk of complications. The patient will keep regular office visits and the patient does agreed to periodic lab work.

## 2018-11-06 ENCOUNTER — Encounter: Payer: Self-pay | Admitting: Family Medicine

## 2018-11-12 ENCOUNTER — Encounter (INDEPENDENT_AMBULATORY_CARE_PROVIDER_SITE_OTHER): Payer: Medicare Other | Admitting: Ophthalmology

## 2018-11-12 DIAGNOSIS — H43813 Vitreous degeneration, bilateral: Secondary | ICD-10-CM

## 2018-11-12 DIAGNOSIS — H35341 Macular cyst, hole, or pseudohole, right eye: Secondary | ICD-10-CM

## 2018-11-12 DIAGNOSIS — I10 Essential (primary) hypertension: Secondary | ICD-10-CM

## 2018-11-12 DIAGNOSIS — H35033 Hypertensive retinopathy, bilateral: Secondary | ICD-10-CM

## 2018-12-22 ENCOUNTER — Other Ambulatory Visit: Payer: Self-pay | Admitting: Family Medicine

## 2019-01-04 ENCOUNTER — Other Ambulatory Visit: Payer: Self-pay | Admitting: Family Medicine

## 2019-01-22 ENCOUNTER — Other Ambulatory Visit: Payer: Self-pay | Admitting: Family Medicine

## 2019-02-08 ENCOUNTER — Encounter (INDEPENDENT_AMBULATORY_CARE_PROVIDER_SITE_OTHER): Payer: Medicare Other | Admitting: Ophthalmology

## 2019-03-05 ENCOUNTER — Other Ambulatory Visit: Payer: Self-pay | Admitting: Family Medicine

## 2019-03-05 NOTE — Telephone Encounter (Signed)
This +5 refills

## 2019-03-11 ENCOUNTER — Telehealth: Payer: Self-pay | Admitting: Family Medicine

## 2019-03-11 NOTE — Telephone Encounter (Signed)
Please tell Magan that it is not wise to do blood work right now.  I would not recommend sitting in a waiting area waiting for blood draw.  I would recommend to consider toward the end of April or May when this starts settling down to a more reasonable level then to do lab work with a follow-up

## 2019-03-11 NOTE — Telephone Encounter (Signed)
Patient said Dr. Nicki Reaper told her she would need to have labwork done at the end of March but is wondering if she should still have that done now? ( No active orders in Epic for this.)

## 2019-03-11 NOTE — Telephone Encounter (Signed)
Last had lab drawn 09/19/2018 Hep C,Hepatic Fx,bmet,Hgb A1c,Micro Albumin

## 2019-03-12 NOTE — Telephone Encounter (Signed)
I called and left a message for the patient that Dr. Nicki Reaper advises that patient wait a couple of months to have labwork done and for her to call our office with any questions.

## 2019-03-16 ENCOUNTER — Other Ambulatory Visit: Payer: Self-pay | Admitting: Family Medicine

## 2019-04-05 ENCOUNTER — Other Ambulatory Visit: Payer: Self-pay | Admitting: Family Medicine

## 2019-04-05 NOTE — Telephone Encounter (Signed)
Pt called back and scheduled med check refills sent

## 2019-04-05 NOTE — Telephone Encounter (Signed)
Schedule virtual visit may have 1 refill

## 2019-04-05 NOTE — Telephone Encounter (Signed)
Left message to return call 

## 2019-04-08 ENCOUNTER — Other Ambulatory Visit: Payer: Self-pay

## 2019-04-08 ENCOUNTER — Ambulatory Visit (INDEPENDENT_AMBULATORY_CARE_PROVIDER_SITE_OTHER): Payer: Medicare Other | Admitting: Family Medicine

## 2019-04-08 DIAGNOSIS — E119 Type 2 diabetes mellitus without complications: Secondary | ICD-10-CM

## 2019-04-08 DIAGNOSIS — G4709 Other insomnia: Secondary | ICD-10-CM

## 2019-04-08 DIAGNOSIS — E782 Mixed hyperlipidemia: Secondary | ICD-10-CM

## 2019-04-08 DIAGNOSIS — Z79899 Other long term (current) drug therapy: Secondary | ICD-10-CM

## 2019-04-08 DIAGNOSIS — I10 Essential (primary) hypertension: Secondary | ICD-10-CM

## 2019-04-08 MED ORDER — EMPAGLIFLOZIN 10 MG PO TABS: 10.0000 mg | ORAL_TABLET | Freq: Every day | ORAL | 6 refills | Status: DC

## 2019-04-08 MED ORDER — LOSARTAN POTASSIUM 100 MG PO TABS: ORAL_TABLET | ORAL | 5 refills | Status: DC

## 2019-04-08 MED ORDER — SERTRALINE HCL 50 MG PO TABS: ORAL_TABLET | ORAL | 5 refills | Status: DC

## 2019-04-08 MED ORDER — ALPRAZOLAM 0.5 MG PO TABS: ORAL_TABLET | ORAL | 5 refills | Status: DC

## 2019-04-08 MED ORDER — ROSUVASTATIN CALCIUM 20 MG PO TABS: 20.0000 mg | ORAL_TABLET | Freq: Every day | ORAL | 6 refills | Status: DC

## 2019-04-08 NOTE — Addendum Note (Signed)
Addended by: Carmelina Noun on: 04/08/2019 01:03 PM   Modules accepted: Orders

## 2019-04-08 NOTE — Progress Notes (Signed)
   Subjective:    Patient ID: Christie [name], female    DOB: Dec 06, 1946, 73 y.o.   MRN: [id number] Phone visit only patient did not have video Patient was at home I was present at the office patient consented Diabetes  She presents for her follow-up diabetic visit. She has type 2 diabetes mellitus. She is compliant with treatment all of the time. She is following a generally healthy diet. Exercise: walking some - not as much as she should. Home blood sugar record trend: 103 - 109. She does not see a podiatrist.Eye exam is current.  Phone visit Doing well with her medications These were all reviewed Xanax she only uses at nighttime sparingly Glipizide she takes she denies any low sugar spells she will monitor these she will do lab work in the summer Blood pressure doing well as best she knows she will continue medicine Cholesterol doing well with medicine watching diet closely. Pt states no concerns today. Needs refills on xanax.   Virtual Visit via Video Note  I connected with Christie [name] on 04/08/19 at 11:00 AM EDT by a video enabled telemedicine application and verified that I am speaking with the correct person using two identifiers.   I discussed the limitations of evaluation and management by telemedicine and the availability of in person appointments. The patient expressed understanding and agreed to proceed.  History of Present Illness:    Observations/Objective:   Assessment and Plan:   Follow Up Instructions:    I discussed the assessment and treatment plan with the patient. The patient was provided an opportunity to ask questions and all were answered. The patient agreed with the plan and demonstrated an understanding of the instructions.   The patient was advised to call back or seek an in-person evaluation if the symptoms worsen or if the condition fails to improve as anticipated.  I provided 17 minutes of non-face-to-face time during this encounter.     Review of  Systems     Objective:   Physical Exam        Assessment & Plan:  Blood pressure good control continue current medications follow-up later this year Hyperlipidemia check lab work previous labs reviewed continue medication Insomnia Xanax when necessary caution drowsiness  Lab work later this summer follow-up by August

## 2019-04-16 ENCOUNTER — Other Ambulatory Visit: Payer: Self-pay

## 2019-04-16 ENCOUNTER — Ambulatory Visit (INDEPENDENT_AMBULATORY_CARE_PROVIDER_SITE_OTHER): Payer: Medicare Other | Admitting: Family Medicine

## 2019-04-16 DIAGNOSIS — I10 Essential (primary) hypertension: Secondary | ICD-10-CM

## 2019-04-16 MED ORDER — AMLODIPINE BESYLATE 2.5 MG PO TABS: ORAL_TABLET | ORAL | 5 refills | Status: DC

## 2019-04-16 NOTE — Progress Notes (Signed)
   Subjective:    Patient ID: Christie [name], female    DOB: 05-26-1946, 73 y.o.   MRN: [id number] Phone  Patient present at home Provider present at office Consent for interaction obtained Coronavirus outbreak made virtual visit necessary    Hypertension  This is a chronic problem. (Pt states she has been having a funny feeling at the top of head. Pt states this has been going on since last Tuesday. BP readings have been 163/81;156/67;192/81;186/79. Pt states this gets worse in the afternoon. Pt had phone visit with provider last week and everything was good. ) There are no compliance problems.       Virtual Visit via Telephone Note  I connected with [name] on 04/16/19 at 11:30 AM EDT by telephone and verified that I am speaking with the correct person using two identifiers.   I discussed the limitations, risks, security and privacy concerns of performing an evaluation and management service by telephone and the availability of in person appointments. I also discussed with the patient that there may be a patient responsible charge related to this service. The patient expressed understanding and agreed to proceed.   History of Present Illness:    Observations/Objective:   Assessment and Plan:   Follow Up Instructions:    I discussed the assessment and treatment plan with the patient. The patient was provided an opportunity to ask questions and all were answered. The patient agreed with the plan and demonstrated an understanding of the instructions.   The patient was advised to call back or seek an in-person evaluation if the symptoms worsen or if the condition fails to improve as anticipated.  I provided 15 minutes of non-face-to-face time during this encounter.   Vicente Males, LPN   Review of Systems     Objective:   Physical Exam   Phone discussion held regarding blood pressure Discussed activity physical activity as well as low-salt diet as well as causes of  systolic hypertension patient denies any type of chest tightness pressure pain shortness of breath she does have a history of diabetes a.m. high blood pressure.  She is taking her losartan on a regular basis.  She does consent to making adjustments in her medicine I do not want to add a diuretic given that she is already on it medication could contribute to dehydration therefore will add amlodipine we did discuss proper ways to measure blood pressure as well as monitoring closely.     Assessment & Plan:  HTN Add amlodipine 2.5 mg We will follow-up with patient in 2 weeks time to see how she is doing watch diet closely.

## 2019-05-03 ENCOUNTER — Other Ambulatory Visit: Payer: Self-pay | Admitting: Family Medicine

## 2019-05-20 ENCOUNTER — Telehealth: Payer: Self-pay | Admitting: Family Medicine

## 2019-05-20 NOTE — Telephone Encounter (Signed)
Patient mailed in blood pressure reading for you to view. In your folder in box.

## 2019-05-21 NOTE — Telephone Encounter (Signed)
Please inform patient I reviewed over her blood pressures The majority of these look improved Certainly the top number occasionally is higher than what we would like but the bottom number is well controlled. I would not add additional medicine or increase doses at this time Healthy diet on a regular basis Sitting in some walking on a regular basis Doing this would help overall control Please follow-up for office visit September or early October sooner if any problems it would be wise to do lab work before that

## 2019-05-21 NOTE — Telephone Encounter (Signed)
Patient advised per Dr Nicki Reaper: Dr Nicki Reaper reviewed over her blood pressures The majority of these look improved Certainly the top number occasionally is higher than what we would like but the bottom number is well controlled. Dr Nicki Reaper would not add additional medicine or increase doses at this time Healthy diet on a regular basis Sitting in some walking on a regular basis Doing this would help overall control Please follow-up for office visit September or early October sooner if any problems it would be wise to do lab work before that Patient verbalized understanding.

## 2019-06-04 ENCOUNTER — Other Ambulatory Visit: Payer: Self-pay | Admitting: Family Medicine

## 2019-08-03 ENCOUNTER — Other Ambulatory Visit: Payer: Self-pay | Admitting: Family Medicine

## 2019-08-05 ENCOUNTER — Telehealth: Payer: Self-pay | Admitting: Family Medicine

## 2019-08-05 ENCOUNTER — Other Ambulatory Visit: Payer: Self-pay | Admitting: *Deleted

## 2019-08-05 MED ORDER — GLIPIZIDE 5 MG PO TABS: ORAL_TABLET | ORAL | 2 refills | Status: DC

## 2019-08-05 NOTE — Telephone Encounter (Signed)
Patient is requesting refill on glipizide 5mg  called into [facility]. She states is completely out and needing it tonight if possible Explained to her 48 hours for refills

## 2019-08-05 NOTE — Telephone Encounter (Signed)
Refills sent per dr Nicki Reaper. Pt notified.

## 2019-08-17 ENCOUNTER — Other Ambulatory Visit: Payer: Self-pay

## 2019-08-19 ENCOUNTER — Ambulatory Visit (INDEPENDENT_AMBULATORY_CARE_PROVIDER_SITE_OTHER): Payer: Medicare Other | Admitting: Family Medicine

## 2019-08-19 ENCOUNTER — Other Ambulatory Visit: Payer: Self-pay

## 2019-08-19 DIAGNOSIS — M545 Low back pain, unspecified: Secondary | ICD-10-CM

## 2019-08-19 NOTE — Progress Notes (Signed)
   Subjective:    Patient ID: Christie [name], female    DOB: [birth year], 73 y.o.   MRN: [id number]  HPI  Patient calls with bilateral leg pain for several weeks- goes down her legs- has history of back surgery and sciatica in right leg. Bilateral leg pain discomfort when she is sitting standing or walking she has had previous back surgery this been going on for several months.  Denies any injury to it.  Is not doing any stretching exercises currently.  Patient states overall she feels she is trying to do the best she can takes Tylenol as needed avoids ibuprofen denies any numbness down the legs states the pain is on a level of 3 out of 10 Virtual Visit via Video Note  I connected with KARRIANN CRISTOFARO on 08/19/19 at  9:00 AM EDT by a video enabled telemedicine application and verified that I am speaking with the correct person using two identifiers.  Location: Patient: home Provider: office   I discussed the limitations of evaluation and management by telemedicine and the availability of in person appointments. The patient expressed understanding and agreed to proceed.  History of Present Illness:    Observations/Objective:   Assessment and Plan:   Follow Up Instructions:    I discussed the assessment and treatment plan with the patient. The patient was provided an opportunity to ask questions and all were answered. The patient agreed with the plan and demonstrated an understanding of the instructions.   The patient was advised to call back or seek an in-person evaluation if the symptoms worsen or if the condition fails to improve as anticipated.  I provided 17 minutes of non-face-to-face time during this encounter.     Review of Systems  Constitutional: Negative for activity change and appetite change.  HENT: Negative for congestion and rhinorrhea.   Respiratory: Negative for cough and shortness of breath.   Cardiovascular: Negative for chest pain and leg swelling.   Gastrointestinal: Negative for abdominal pain, nausea and vomiting.  Musculoskeletal: Positive for back pain. Negative for arthralgias.  Skin: Negative for color change.  Neurological: Negative for dizziness and weakness.  Psychiatric/Behavioral: Negative for agitation and confusion.       Objective:   Physical Exam  Today's visit was via telephone Physical exam was not possible for this visit       Assessment & Plan:  Lumbar pain Possible spinal stenosis Stretching exercises recommended Tylenol as needed OTC measures If ongoing trouble next step would be gabapentin Hold off on x-rays currently  Patient will do her lab work as well follow-up 3 to 4 weeks virtually

## 2019-09-09 ENCOUNTER — Ambulatory Visit (INDEPENDENT_AMBULATORY_CARE_PROVIDER_SITE_OTHER): Payer: Medicare Other | Admitting: Family Medicine

## 2019-09-09 ENCOUNTER — Other Ambulatory Visit: Payer: Self-pay

## 2019-09-09 DIAGNOSIS — I10 Essential (primary) hypertension: Secondary | ICD-10-CM | POA: Diagnosis not present

## 2019-09-09 DIAGNOSIS — M545 Low back pain, unspecified: Secondary | ICD-10-CM

## 2019-09-09 DIAGNOSIS — E782 Mixed hyperlipidemia: Secondary | ICD-10-CM

## 2019-09-09 DIAGNOSIS — D751 Secondary polycythemia: Secondary | ICD-10-CM

## 2019-09-09 DIAGNOSIS — E1165 Type 2 diabetes mellitus with hyperglycemia: Secondary | ICD-10-CM | POA: Diagnosis not present

## 2019-09-09 DIAGNOSIS — G4700 Insomnia, unspecified: Secondary | ICD-10-CM

## 2019-09-09 MED ORDER — JARDIANCE 10 MG PO TABS: 10.0000 mg | ORAL_TABLET | Freq: Every day | ORAL | 5 refills | Status: DC

## 2019-09-09 MED ORDER — LOSARTAN POTASSIUM 100 MG PO TABS: ORAL_TABLET | ORAL | 5 refills | Status: DC

## 2019-09-09 MED ORDER — SERTRALINE HCL 50 MG PO TABS: ORAL_TABLET | ORAL | 5 refills | Status: DC

## 2019-09-09 MED ORDER — ROSUVASTATIN CALCIUM 20 MG PO TABS: 20.0000 mg | ORAL_TABLET | Freq: Every day | ORAL | 5 refills | Status: DC

## 2019-09-09 MED ORDER — GLIPIZIDE 5 MG PO TABS: ORAL_TABLET | ORAL | 5 refills | Status: DC

## 2019-09-09 MED ORDER — AMLODIPINE BESYLATE 2.5 MG PO TABS: ORAL_TABLET | ORAL | 5 refills | Status: DC

## 2019-09-09 MED ORDER — ALPRAZOLAM 0.5 MG PO TABS: ORAL_TABLET | ORAL | 5 refills | Status: DC

## 2019-09-09 NOTE — Progress Notes (Signed)
Subjective:    Patient ID: NANETTA FAVORITE, female    DOB: [birth year], 73 y.o.   MRN: [id number]  HPI Patient calls for a follow up on leg pain.  Patient states her back still bother he a little but her legs are better Patient relates on low back pain but this is been ongoing since she has had surgery she denies any type of high fevers chills sweats nausea vomiting diarrhea she does relate some pains will radiate some into the leg not as bad as what was Tylenol does well not does not need pain medicine  Patient suffers with insomnia.  This is been going on for a while.  The patient finds it necessary to use medication to help sleep.  Patient finds it if not using medication has significant troubles.  Denies abusing the medication.  Denies any negative side effects. She does use her medication it does help her she requests refills  The patient was seen today as part of a comprehensive diabetic check up.the patient does have diabetes.  The patient follows here on a regular basis.  The patient relates medication compliance. No significant side effects to the medications. Denies any low glucose spells. Relates compliance with diet to a reasonable level. Patient does do labwork intermittently and understands the dangers of diabetes.  To the best of her knowledge her diabetes is doing okay but she will need to do some follow-up lab work  Patient for blood pressure check up.  The patient does have hypertension.  The patient is on medication.  Patient relates compliance with meds. Todays BP reviewed with the patient. Patient denies issues with medication. Patient relates reasonable diet. Patient tries to minimize salt. Patient aware of BP goals. She does state when she checks her blood pressure it seems to be under good control  Patient here for follow-up regarding cholesterol.  The patient does have hyperlipidemia.  Patient does try to maintain a reasonable diet.  Patient does take the medication on a  regular basis.  Denies missing a dose.  The patient denies any obvious side effects.  Prior blood work results reviewed with the patient.  The patient is aware of his cholesterol goals and the need to keep it under good control to lessen the risk of disease. She does relate that she is trying with the diet he had taken her medicine on a regular basis Virtual Visit via Video Note  I connected with JOCLYN DELION on 09/09/19 at  9:00 AM EDT by a video enabled telemedicine application and verified that I am speaking with the correct person using two identifiers.  Location: Patient: home  Provider: office   I discussed the limitations of evaluation and management by telemedicine and the availability of in person appointments. The patient expressed understanding and agreed to proceed.  History of Present Illness:    Observations/Objective:   Assessment and Plan:   Follow Up Instructions:    I discussed the assessment and treatment plan with the patient. The patient was provided an opportunity to ask questions and all were answered. The patient agreed with the plan and demonstrated an understanding of the instructions.   The patient was advised to call back or seek an in-person evaluation if the symptoms worsen or if the condition fails to improve as anticipated.  I provided 25 minutes of non-face-to-face time during this encounter.       Review of Systems  Constitutional: Negative for activity change, appetite change and fatigue.  HENT: Negative for congestion and rhinorrhea.   Respiratory: Negative for cough and shortness of breath.   Cardiovascular: Negative for chest pain and leg swelling.  Gastrointestinal: Negative for abdominal pain and diarrhea.  Endocrine: Negative for polydipsia and polyphagia.  Skin: Negative for color change.  Neurological: Negative for dizziness and weakness.  Psychiatric/Behavioral: Negative for behavioral problems and confusion.       Objective:    Physical Exam  Today's visit was via telephone Physical exam was not possible for this visit       Assessment & Plan:  1. Lumbar back pain She currently is using Tylenol stretching exercises no need for imaging currently  2. Mixed hyperlipidemia Previous labs reviewed new labs ordered take medication watch diet stay active - Lipid panel  3. Essential hypertension, benign Blood pressure reportedly good by patient continue current medications continue regular activity follow-up within 6 months - Hepatic function panel  4. Poorly controlled type 2 diabetes mellitus ([place]) His best she knows her diabetes been doing well but she is due for lab work await the results of all this she will follow-up within 6 months - Basic metabolic panel - Hemoglobin A1c - Microalbumin / creatinine urine ratio  5. Insomnia, unspecified type She does take Xanax at nighttime denies abusing it states it does help her with her sleep  6. Polycythemia She has had a elevated hemoglobin in the past she wants to have her CBC rechecked again.  She is worried about cancer but not having any specific symptoms of it - CBC with Differential/Platelet   25 minutes was spent with the patient.  This statement verifies that 25 minutes was indeed spent with the patient.  More than 50% of this visit-total duration of the visit-was spent in counseling and coordination of care. The issues that the patient came in for today as reflected in the diagnosis (s) please refer to documentation for further details.

## 2019-09-23 ENCOUNTER — Other Ambulatory Visit: Payer: Self-pay

## 2019-09-23 ENCOUNTER — Ambulatory Visit (INDEPENDENT_AMBULATORY_CARE_PROVIDER_SITE_OTHER): Payer: Medicare Other | Admitting: Family Medicine

## 2019-09-23 ENCOUNTER — Encounter: Payer: Self-pay | Admitting: Family Medicine

## 2019-09-23 DIAGNOSIS — M542 Cervicalgia: Secondary | ICD-10-CM

## 2019-09-23 DIAGNOSIS — M25511 Pain in right shoulder: Secondary | ICD-10-CM | POA: Diagnosis not present

## 2019-09-23 DIAGNOSIS — S161XXA Strain of muscle, fascia and tendon at neck level, initial encounter: Secondary | ICD-10-CM

## 2019-09-23 MED ORDER — DICLOFENAC SODIUM 75 MG PO TBEC: DELAYED_RELEASE_TABLET | ORAL | 1 refills | Status: DC

## 2019-09-23 MED ORDER — TIZANIDINE HCL 4 MG PO CAPS: ORAL_CAPSULE | ORAL | 1 refills | Status: DC

## 2019-09-23 NOTE — Progress Notes (Signed)
   Subjective:  Audio only  Patient ID: Christie [name], female    DOB: 1946-11-24, 73 y.o.   MRN: [id number]  Shoulder Pain  The pain is present in the neck and right shoulder. This is a new problem. Episode onset: 2 weeks ago  The quality of the pain is described as aching. She has tried acetaminophen and heat (stretching, OTC meds) for the symptoms. The treatment provided no relief.   Virtual Visit via Video Note  I connected with MARISKA STENNETT on 09/23/19 at 11:00 AM EDT by a video enabled telemedicine application and verified that I am speaking with the correct person using two identifiers.  Location: Patient: home  Provider: office   I discussed the limitations of evaluation and management by telemedicine and the availability of in person appointments. The patient expressed understanding and agreed to proceed.  History of Present Illness:    Observations/Objective:   Assessment and Plan:   Follow Up Instructions:    I discussed the assessment and treatment plan with the patient. The patient was provided an opportunity to ask questions and all were answered. The patient agreed with the plan and demonstrated an understanding of the instructions.   The patient was advised to call back or seek an in-person evaluation if the symptoms worsen or if the condition fails to improve as anticipated.  I provided20 minutes of non-face-to-face time during this encounter.   Vicente Males, LPN Neck, tight   Now into the shoulder   tyl helps, but not a lot  Positive pain with rotation of neck deep ache.  No radiation into the arm.  Pain at times sharp with movement and aching at other times   Review of Systems No headache no chest pain no shortness of breath    Objective:   Physical Exam Virtual Positive pain with lateral motion of the neck contralateral      Assessment & Plan:  Impression subacute neck strain.  Local measures discussed.  No radiological interventions  rationale discussed.  Prescription strength anti-inflammatory with PRN muscle spasm medicine.  Symptom care discussed

## 2019-09-30 ENCOUNTER — Other Ambulatory Visit: Payer: Self-pay

## 2019-09-30 ENCOUNTER — Ambulatory Visit (INDEPENDENT_AMBULATORY_CARE_PROVIDER_SITE_OTHER): Payer: Medicare Other | Admitting: Family Medicine

## 2019-09-30 DIAGNOSIS — H8390 Unspecified disease of inner ear, unspecified ear: Secondary | ICD-10-CM

## 2019-09-30 MED ORDER — MECLIZINE HCL 25 MG PO TABS: ORAL_TABLET | ORAL | 0 refills | Status: DC

## 2019-09-30 NOTE — Progress Notes (Signed)
   Subjective:    Patient ID: Christie [name], female    DOB: 1946-10-24, 73 y.o.   MRN: [id number]  Dizziness This is a new problem. The current episode started today (started bad this morning). Associated symptoms include headaches. Pertinent negatives include no abdominal pain, chest pain, congestion, coughing, fatigue or weakness. Associated symptoms comments: Worse when laying down; room spinning when laying down, nausea, . She has tried lying down for the symptoms. The treatment provided no relief.  last week spoke with Dr.Steve due to neck pain and was prescribed a muscle relaxer.    We discussed her dizziness is more so when she lays back and sometimes when she changes position there is no unilateral numbness or weakness.  No nausea or vomiting while she is sitting or walking but she does have nausea with the room spinning Virtual Visit via Telephone Note  I connected with Christie [name] on 09/30/19 at  2:00 PM EDT by telephone and verified that I am speaking with the correct person using two identifiers.  Location: Patient: home Provider: office   I discussed the limitations, risks, security and privacy concerns of performing an evaluation and management service by telephone and the availability of in person appointments. I also discussed with the patient that there may be a patient responsible charge related to this service. The patient expressed understanding and agreed to proceed.   History of Present Illness:    Observations/Objective:   Assessment and Plan:   Follow Up Instructions:    I discussed the assessment and treatment plan with the patient. The patient was provided an opportunity to ask questions and all were answered. The patient agreed with the plan and demonstrated an understanding of the instructions.   The patient was advised to call back or seek an in-person evaluation if the symptoms worsen or if the condition fails to improve as anticipated.  I provided 15  minutes of non-face-to-face time during this encounter.   Vicente Males, LPN    Review of Systems  Constitutional: Negative for activity change, appetite change and fatigue.  HENT: Negative for congestion and rhinorrhea.   Respiratory: Negative for cough and shortness of breath.   Cardiovascular: Negative for chest pain and leg swelling.  Gastrointestinal: Negative for abdominal pain and diarrhea.  Endocrine: Negative for polydipsia and polyphagia.  Skin: Negative for color change.  Neurological: Positive for dizziness and headaches. Negative for weakness.  Psychiatric/Behavioral: Negative for behavioral problems and confusion.       Objective:   Physical Exam  Today's visit was via telephone Physical exam was not possible for this visit       Assessment & Plan:  This sounds more like vertigo and inner ear dysfunction rather than stroke related symptoms she can try meclizine to see if that will take the edge off but she has a daughter who can do some physical therapy with her and will try Epley maneuver patient will let us know if not improving over the course the next several days hold off on ENT referral currently

## 2019-10-02 ENCOUNTER — Telehealth: Payer: Self-pay | Admitting: Family Medicine

## 2019-10-02 DIAGNOSIS — R42 Dizziness and giddiness: Secondary | ICD-10-CM

## 2019-10-02 NOTE — Telephone Encounter (Signed)
Vestibular therapy through [facility]

## 2019-10-02 NOTE — Telephone Encounter (Signed)
Pt's vertigo isn't better, would like vestibular therapy  Please order through [facility]   Please advise & call pt

## 2019-10-02 NOTE — Telephone Encounter (Signed)
Please advise. Thank you

## 2019-10-03 NOTE — Telephone Encounter (Signed)
Referral put in to [facility]

## 2019-10-07 ENCOUNTER — Telehealth: Payer: Self-pay | Admitting: Family Medicine

## 2019-10-07 NOTE — Telephone Encounter (Signed)
Please go ahead and give verbal orders regarding this thank you

## 2019-10-07 NOTE — Telephone Encounter (Signed)
Christie [name] from [facility] needing verbal orders for therapy for 2 weeks 2 and one week one. [phone]

## 2019-10-07 NOTE — Telephone Encounter (Signed)
Verbal orders given to ben  

## 2019-10-10 ENCOUNTER — Telehealth: Payer: Self-pay | Admitting: *Deleted

## 2019-10-10 NOTE — Telephone Encounter (Signed)
Certainly understand that the blood pressure is elevated It is hard to know the extent of this issue without seeing her in person and checking blood pressure here She should continue her medications I would recommend a visit tomorrow She should bring her medications with her she should also bring her blood pressure cuff with her

## 2019-10-10 NOTE — Telephone Encounter (Signed)
Pt called. Doing therapy for vertigo and pt took bp yesterday and it was 166/80's. He advised her to take bp in the morning when she woke up before sitting up. And she did this morning and it was 213/90 lying in bed and then she sat up in bed and checked it and it was 220/110. She got up and sat at her kitchen table and checked again and it was 177/86. She had not taken her bp meds (losartan 100mg  qd and amlodipine 2.5mg  qd at that point) she took them around 8 am and called office at 10am, I asked her had she checked bp since taking meds and she said no. I held while she checked her bp and it was 188/86. She states she feels good and her vertigo is still there but it is better.   [facility]   [phone]

## 2019-10-10 NOTE — Telephone Encounter (Signed)
Pt contacted and verbalized understanding. Pt transferred up front to set up appt  

## 2019-10-11 ENCOUNTER — Encounter: Payer: Self-pay | Admitting: Family Medicine

## 2019-10-11 ENCOUNTER — Other Ambulatory Visit: Payer: Self-pay

## 2019-10-11 ENCOUNTER — Ambulatory Visit (INDEPENDENT_AMBULATORY_CARE_PROVIDER_SITE_OTHER): Payer: Medicare Other | Admitting: Family Medicine

## 2019-10-11 VITALS — BP 152/78 | Temp 96.9°F | Wt 183.4 lb

## 2019-10-11 DIAGNOSIS — H8390 Unspecified disease of inner ear, unspecified ear: Secondary | ICD-10-CM | POA: Diagnosis not present

## 2019-10-11 DIAGNOSIS — G542 Cervical root disorders, not elsewhere classified: Secondary | ICD-10-CM | POA: Diagnosis not present

## 2019-10-11 MED ORDER — AMLODIPINE BESYLATE 5 MG PO TABS: ORAL_TABLET | ORAL | 4 refills | Status: DC

## 2019-10-11 MED ORDER — GABAPENTIN 100 MG PO CAPS: 100.0000 mg | ORAL_CAPSULE | Freq: Three times a day (TID) | ORAL | 3 refills | Status: DC

## 2019-10-11 NOTE — Progress Notes (Signed)
Subjective:    Patient ID: Christie [name], female    DOB: Apr 27, 1946, 73 y.o.   MRN: [id number]  HPI Pt here today due to having vertigo for a few weeks. Pt had therapy this week and on Monday, Wednesday and Thursday morning, pt BP was high. Pt has had some headaches and dizziness.  Patient with intermittent dizziness feeling somewhat off balance feels like the room is spinning going through physical therapy currently to address this  Also having some intermittent pain from the right side of the neck radiates down the arm.  Goes into the bicep tricep region but not into the forearm.  No numbness in the hand.  Relates no weakness or decreased range of motion.  There is nothing that seem to trigger this. Pt also have a pain in her neck that goes to shoulder.   Also patient having significant elevated blood pressures on several readings were on the high range.  Taking it with her cuff at home.  Denies any severe headaches nausea vomiting double vision Review of Systems  Constitutional: Negative for activity change, appetite change and fatigue.  HENT: Negative for congestion and rhinorrhea.   Respiratory: Negative for cough and shortness of breath.   Cardiovascular: Negative for chest pain and leg swelling.  Gastrointestinal: Negative for abdominal pain and diarrhea.  Endocrine: Negative for polydipsia and polyphagia.  Musculoskeletal: Positive for back pain and neck pain.  Skin: Negative for color change.  Neurological: Negative for dizziness and weakness.  Psychiatric/Behavioral: Negative for behavioral problems and confusion.       Objective:   Physical Exam Vitals signs reviewed.  Constitutional:      General: She is not in acute distress. HENT:     Head: Normocephalic and atraumatic.  Eyes:     General:        Right eye: No discharge.        Left eye: No discharge.  Neck:     Trachea: No tracheal deviation.  Cardiovascular:     Rate and Rhythm: Normal rate and regular rhythm.      Heart sounds: Normal heart sounds. No murmur.  Pulmonary:     Effort: Pulmonary effort is normal. No respiratory distress.     Breath sounds: Normal breath sounds.  Lymphadenopathy:     Cervical: No cervical adenopathy.  Skin:    General: Skin is warm and dry.  Neurological:     Mental Status: She is alert.     Coordination: Coordination normal.  Psychiatric:        Behavior: Behavior normal.    Blood pressure was checked with our unit and her unit Her unit shows that it overestimates by at least 10-15 points  No weakness in the arm detected reflexes are good     Assessment & Plan:  Blood pressure significantly elevated increase amlodipine you dose 5 mg stick with everything else as it is Stay away from anti-inflammatories Follow-up here within 4 weeks Check blood pressure periodically if so desires but I would not check it frequently because her machine is not highly reliable  If she starts having severe headaches nausea vomiting to follow-up  As for the neck and the arm I did recommend gabapentin she states she is having significant troubles therefore 100 mg 1 3 times daily gradually titrate up to this dose if any side effects or problems notify us  Otherwise follow-up here 4 weeks no need for x-rays or MRI at this point if weakness occurs in  the arm to notify us  As for the vertigo which should gradually get better if any ongoing troubles notify us

## 2019-10-14 ENCOUNTER — Encounter (INDEPENDENT_AMBULATORY_CARE_PROVIDER_SITE_OTHER): Payer: Medicare Other | Admitting: Ophthalmology

## 2019-10-14 ENCOUNTER — Other Ambulatory Visit: Payer: Self-pay

## 2019-10-14 DIAGNOSIS — I10 Essential (primary) hypertension: Secondary | ICD-10-CM

## 2019-10-14 DIAGNOSIS — H35341 Macular cyst, hole, or pseudohole, right eye: Secondary | ICD-10-CM

## 2019-10-14 DIAGNOSIS — H35033 Hypertensive retinopathy, bilateral: Secondary | ICD-10-CM

## 2019-10-14 DIAGNOSIS — H43813 Vitreous degeneration, bilateral: Secondary | ICD-10-CM | POA: Diagnosis not present

## 2019-10-16 NOTE — Telephone Encounter (Signed)
error 

## 2019-10-17 ENCOUNTER — Telehealth: Payer: Self-pay | Admitting: Family Medicine

## 2019-10-17 NOTE — Telephone Encounter (Signed)
FYI

## 2019-10-17 NOTE — Telephone Encounter (Signed)
ok 

## 2019-10-17 NOTE — Telephone Encounter (Signed)
(  Power went out prior to finishing note)  He just wanted to let us know and he will continue treatment.  Ben: [phone]

## 2019-10-17 NOTE — Telephone Encounter (Signed)
Tressia Danas with [facility] called to say that he went out to treat the patient. 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

## 2019-11-06 LAB — HEPATIC FUNCTION PANEL
ALT: 28 IU/L (ref 0–32)
AST: 23 IU/L (ref 0–40)
Albumin: 4.6 g/dL (ref 3.7–4.7)
Alkaline Phosphatase: 121 IU/L — ABNORMAL HIGH (ref 39–117)
Bilirubin Total: 0.7 mg/dL (ref 0.0–1.2)
Bilirubin, Direct: 0.21 mg/dL (ref 0.00–0.40)
Total Protein: 7 g/dL (ref 6.0–8.5)

## 2019-11-06 LAB — CBC WITH DIFFERENTIAL/PLATELET
Basophils Absolute: 0.1 10*3/uL (ref 0.0–0.2)
Basos: 1 %
EOS (ABSOLUTE): 0.2 10*3/uL (ref 0.0–0.4)
Eos: 3 %
Hematocrit: 47.8 % — ABNORMAL HIGH (ref 34.0–46.6)
Hemoglobin: 15.7 g/dL (ref 11.1–15.9)
Immature Grans (Abs): 0 10*3/uL (ref 0.0–0.1)
Immature Granulocytes: 0 %
Lymphocytes Absolute: 1.8 10*3/uL (ref 0.7–3.1)
Lymphs: 31 %
MCH: 30.5 pg (ref 26.6–33.0)
MCHC: 32.8 g/dL (ref 31.5–35.7)
MCV: 93 fL (ref 79–97)
Monocytes Absolute: 0.4 10*3/uL (ref 0.1–0.9)
Monocytes: 7 %
Neutrophils Absolute: 3.4 10*3/uL (ref 1.4–7.0)
Neutrophils: 58 %
Platelets: 149 10*3/uL — ABNORMAL LOW (ref 150–450)
RBC: 5.15 x10E6/uL (ref 3.77–5.28)
RDW: 12.7 % (ref 11.7–15.4)
WBC: 5.9 10*3/uL (ref 3.4–10.8)

## 2019-11-06 LAB — BASIC METABOLIC PANEL
BUN/Creatinine Ratio: 26 (ref 12–28)
BUN: 16 mg/dL (ref 8–27)
CO2: 23 mmol/L (ref 20–29)
Calcium: 10 mg/dL (ref 8.7–10.3)
Chloride: 103 mmol/L (ref 96–106)
Creatinine, Ser: 0.61 mg/dL (ref 0.57–1.00)
GFR calc Af Amer: 103 mL/min/{1.73_m2} (ref >59)
GFR calc non Af Amer: 89 mL/min/{1.73_m2} (ref >59)
Glucose: 132 mg/dL — ABNORMAL HIGH (ref 65–99)
Potassium: 4.2 mmol/L (ref 3.5–5.2)
Sodium: 143 mmol/L (ref 134–144)

## 2019-11-06 LAB — MICROALBUMIN / CREATININE URINE RATIO
Creatinine, Urine: 57 mg/dL
Microalb/Creat Ratio: 16 mg/g creat (ref 0–29)
Microalbumin, Urine: 9.1 ug/mL

## 2019-11-06 LAB — LIPID PANEL
Chol/HDL Ratio: 2.3 ratio (ref 0.0–4.4)
Cholesterol, Total: 162 mg/dL (ref 100–199)
HDL: 72 mg/dL (ref >39)
LDL Chol Calc (NIH): 68 mg/dL (ref 0–99)
Triglycerides: 129 mg/dL (ref 0–149)
VLDL Cholesterol Cal: 22 mg/dL (ref 5–40)

## 2019-11-06 LAB — HEMOGLOBIN A1C
Est. average glucose Bld gHb Est-mCnc: 157 mg/dL
Hgb A1c MFr Bld: 7.1 % — ABNORMAL HIGH (ref 4.8–5.6)

## 2019-11-08 ENCOUNTER — Other Ambulatory Visit: Payer: Self-pay

## 2019-11-08 ENCOUNTER — Encounter: Payer: Self-pay | Admitting: Family Medicine

## 2019-11-08 ENCOUNTER — Ambulatory Visit (INDEPENDENT_AMBULATORY_CARE_PROVIDER_SITE_OTHER): Payer: Medicare Other | Admitting: Family Medicine

## 2019-11-08 VITALS — BP 122/70 | Temp 97.1°F | Ht 63.5 in | Wt 182.6 lb

## 2019-11-08 DIAGNOSIS — I10 Essential (primary) hypertension: Secondary | ICD-10-CM

## 2019-11-08 DIAGNOSIS — E1169 Type 2 diabetes mellitus with other specified complication: Secondary | ICD-10-CM | POA: Diagnosis not present

## 2019-11-08 DIAGNOSIS — E785 Hyperlipidemia, unspecified: Secondary | ICD-10-CM

## 2019-11-08 DIAGNOSIS — G542 Cervical root disorders, not elsewhere classified: Secondary | ICD-10-CM

## 2019-11-08 MED ORDER — HYDROCODONE-ACETAMINOPHEN 5-325 MG PO TABS: 1.0000 | ORAL_TABLET | ORAL | 0 refills | Status: AC | PRN

## 2019-11-08 NOTE — Progress Notes (Signed)
Subjective:    Patient ID: Christie [name], female    DOB: 10-31-1946, 73 y.o.   MRN: [id number]  Hypertension This is a chronic problem. Associated symptoms include neck pain. Pertinent negatives include no chest pain or shortness of breath. Treatments tried: losartan, amlodipine. Compliance problems include exercise (stopped walking due to vertigo).    Would like bw results.  Results for orders placed or performed in visit on 09/09/19  CBC with Differential/Platelet  Result Value Ref Range   WBC 5.9 3.4 - 10.8 x10E3/uL   RBC 5.15 3.77 - 5.28 x10E6/uL   Hemoglobin 15.7 11.1 - 15.9 g/dL   Hematocrit 47.8 (H) 34.0 - 46.6 %   MCV 93 79 - 97 fL   MCH 30.5 26.6 - 33.0 pg   MCHC 32.8 31.5 - 35.7 g/dL   RDW 12.7 11.7 - 15.4 %   Platelets 149 (L) 150 - 450 x10E3/uL   Neutrophils 58 Not Estab. %   Lymphs 31 Not Estab. %   Monocytes 7 Not Estab. %   Eos 3 Not Estab. %   Basos 1 Not Estab. %   Neutrophils Absolute 3.4 1.4 - 7.0 x10E3/uL   Lymphocytes Absolute 1.8 0.7 - 3.1 x10E3/uL   Monocytes Absolute 0.4 0.1 - 0.9 x10E3/uL   EOS (ABSOLUTE) 0.2 0.0 - 0.4 x10E3/uL   Basophils Absolute 0.1 0.0 - 0.2 x10E3/uL   Immature Granulocytes 0 Not Estab. %   Immature Grans (Abs) 0.0 0.0 - 0.1 x10E3/uL  Lipid panel  Result Value Ref Range   Cholesterol, Total 162 100 - 199 mg/dL   Triglycerides 129 0 - 149 mg/dL   HDL 72 >39 mg/dL   VLDL Cholesterol Cal 22 5 - 40 mg/dL   LDL Chol Calc (NIH) 68 0 - 99 mg/dL   Chol/HDL Ratio 2.3 0.0 - 4.4 ratio  Hepatic function panel  Result Value Ref Range   Total Protein 7.0 6.0 - 8.5 g/dL   Albumin 4.6 3.7 - 4.7 g/dL   Bilirubin Total 0.7 0.0 - 1.2 mg/dL   Bilirubin, Direct 0.21 0.00 - 0.40 mg/dL   Alkaline Phosphatase 121 (H) 39 - 117 IU/L   AST 23 0 - 40 IU/L   ALT 28 0 - 32 IU/L  Basic metabolic panel  Result Value Ref Range   Glucose 132 (H) 65 - 99 mg/dL   BUN 16 8 - 27 mg/dL   Creatinine, Ser 0.61 0.57 - 1.00 mg/dL   GFR calc non Af Amer 89 >59  mL/min/1.73   GFR calc Af Amer 103 >59 mL/min/1.73   BUN/Creatinine Ratio 26 12 - 28   Sodium 143 134 - 144 mmol/L   Potassium 4.2 3.5 - 5.2 mmol/L   Chloride 103 96 - 106 mmol/L   CO2 23 20 - 29 mmol/L   Calcium 10.0 8.7 - 10.3 mg/dL  Hemoglobin A1c  Result Value Ref Range   Hgb A1c MFr Bld 7.1 (H) 4.8 - 5.6 %   Est. average glucose Bld gHb Est-mCnc 157 mg/dL  Microalbumin / creatinine urine ratio  Result Value Ref Range   Creatinine, Urine 57.0 Not Estab. mg/dL   Microalbumin, Urine 9.1 Not Estab. ug/mL   Microalb/Creat Ratio 16 0 - 29 mg/g creat   We did go over blood work in detail.  A1c acceptable at 7.1.  Rest of lab work overall looks fairly good.  This patient does not fact have diabetes does try to watch her diet takes her medicine she states  she is she could do a better job of activity and watching her diet denies any excessively high or low sugars  Has history of blood pressure takes her medicine minimizes salt to the best of her knowledge blood pressure good compliant with her medicine does try to do some exercise but not much  History of hyperlipidemia takes her medicine denies any problem with the medicine is compliant tolerates it well.  Pain on right side of neck that radiates to arm. Taking gabapentin 100mg  one bid and not helping much. States advil helps the most but she is not able to take that because of her bp meds.  She denies weakness into the arms she is using the gabapentin twice daily currently with some help  Review of Systems  Constitutional: Negative for activity change, appetite change and fatigue.  HENT: Negative for congestion and rhinorrhea.   Respiratory: Negative for cough and shortness of breath.   Cardiovascular: Negative for chest pain and leg swelling.  Gastrointestinal: Negative for abdominal pain and diarrhea.  Endocrine: Negative for polydipsia and polyphagia.  Musculoskeletal: Positive for arthralgias, back pain and neck pain.  Skin:  Negative for color change.  Neurological: Negative for dizziness and weakness.  Psychiatric/Behavioral: Negative for behavioral problems and confusion.       Objective:   Physical Exam Vitals signs reviewed.  Constitutional:      General: She is not in acute distress. HENT:     Head: Normocephalic and atraumatic.  Eyes:     General:        Right eye: No discharge.        Left eye: No discharge.  Neck:     Trachea: No tracheal deviation.  Cardiovascular:     Rate and Rhythm: Normal rate and regular rhythm.     Heart sounds: Normal heart sounds. No murmur.  Pulmonary:     Effort: Pulmonary effort is normal. No respiratory distress.     Breath sounds: Normal breath sounds.  Lymphadenopathy:     Cervical: No cervical adenopathy.  Skin:    General: Skin is warm and dry.  Neurological:     Mental Status: She is alert.     Coordination: Coordination normal.  Psychiatric:        Behavior: Behavior normal.           Assessment & Plan:  The patient was seen today as part of a comprehensive visit for diabetes. The importance of keeping her A1c at or below 7 was discussed.  Importance of regular physical activity was discussed.   The importance of adherence to medication as well as a controlled low starch/sugar diet was also discussed.  Standard follow-up visit recommended.  Also patient aware failure to keep diabetes under control increases the risk of complications. A1c fair at 7.1 she will work hard on diet and activity  HTN- Patient was seen today as part of a visit regarding hypertension. The importance of healthy diet and regular physical activity was discussed. The importance of compliance with medications discussed.  Ideal goal is to keep blood pressure low elevated levels certainly below [id number] when possible.  The patient was counseled that keeping blood pressure under control lessen his risk of complications.  The importance of regular follow-ups was discussed with  the patient.  Low-salt diet such as DASH recommended.  Regular physical activity was recommended as well.  Patient was advised to keep regular follow-ups. Blood pressure good control continue current measures  Cervical impingement she will watch  this closely gabapentin 3 times a day if it causes drowsiness let us know hydrocodone when absolutely necessary patient will give [place] feedback within 2 weeks if not dramatically better or if progressively worse over the next several weeks may need to have MRI of the neck gentle stretching exercises recommended  The patient was seen today as part of an evaluation regarding hyperlipidemia.  Recent lab work has been reviewed with the patient as well as the goals for good cholesterol care.  In addition to this medications have been discussed the importance of compliance with diet and medications discussed as well.  Finally the patient is aware that poor control of cholesterol, noncompliance can dramatically increase the risk of complications. The patient will keep regular office visits and the patient does agreed to periodic lab work. Overall doing well with her medication and numbers look good recently.  25 minutes was spent with the patient.  This statement verifies that 25 minutes was indeed spent with the patient.  More than 50% of this visit-total duration of the visit-was spent in counseling and coordination of care. The issues that the patient came in for today as reflected in the diagnosis (s) please refer to documentation for further details.

## 2019-11-08 NOTE — Patient Instructions (Signed)
Results for orders placed or performed in visit on 09/09/19  CBC with Differential/Platelet  Result Value Ref Range   WBC 5.9 3.4 - 10.8 x10E3/uL   RBC 5.15 3.77 - 5.28 x10E6/uL   Hemoglobin 15.7 11.1 - 15.9 g/dL   Hematocrit 47.8 (H) 34.0 - 46.6 %   MCV 93 79 - 97 fL   MCH 30.5 26.6 - 33.0 pg   MCHC 32.8 31.5 - 35.7 g/dL   RDW 12.7 11.7 - 15.4 %   Platelets 149 (L) 150 - 450 x10E3/uL   Neutrophils 58 Not Estab. %   Lymphs 31 Not Estab. %   Monocytes 7 Not Estab. %   Eos 3 Not Estab. %   Basos 1 Not Estab. %   Neutrophils Absolute 3.4 1.4 - 7.0 x10E3/uL   Lymphocytes Absolute 1.8 0.7 - 3.1 x10E3/uL   Monocytes Absolute 0.4 0.1 - 0.9 x10E3/uL   EOS (ABSOLUTE) 0.2 0.0 - 0.4 x10E3/uL   Basophils Absolute 0.1 0.0 - 0.2 x10E3/uL   Immature Granulocytes 0 Not Estab. %   Immature Grans (Abs) 0.0 0.0 - 0.1 x10E3/uL  Lipid panel  Result Value Ref Range   Cholesterol, Total 162 100 - 199 mg/dL   Triglycerides 129 0 - 149 mg/dL   HDL 72 >39 mg/dL   VLDL Cholesterol Cal 22 5 - 40 mg/dL   LDL Chol Calc (NIH) 68 0 - 99 mg/dL   Chol/HDL Ratio 2.3 0.0 - 4.4 ratio  Hepatic function panel  Result Value Ref Range   Total Protein 7.0 6.0 - 8.5 g/dL   Albumin 4.6 3.7 - 4.7 g/dL   Bilirubin Total 0.7 0.0 - 1.2 mg/dL   Bilirubin, Direct 0.21 0.00 - 0.40 mg/dL   Alkaline Phosphatase 121 (H) 39 - 117 IU/L   AST 23 0 - 40 IU/L   ALT 28 0 - 32 IU/L  Basic metabolic panel  Result Value Ref Range   Glucose 132 (H) 65 - 99 mg/dL   BUN 16 8 - 27 mg/dL   Creatinine, Ser 0.61 0.57 - 1.00 mg/dL   GFR calc non Af Amer 89 >59 mL/min/1.73   GFR calc Af Amer 103 >59 mL/min/1.73   BUN/Creatinine Ratio 26 12 - 28   Sodium 143 134 - 144 mmol/L   Potassium 4.2 3.5 - 5.2 mmol/L   Chloride 103 96 - 106 mmol/L   CO2 23 20 - 29 mmol/L   Calcium 10.0 8.7 - 10.3 mg/dL  Hemoglobin A1c  Result Value Ref Range   Hgb A1c MFr Bld 7.1 (H) 4.8 - 5.6 %   Est. average glucose Bld gHb Est-mCnc 157 mg/dL   Microalbumin / creatinine urine ratio  Result Value Ref Range   Creatinine, Urine 57.0 Not Estab. mg/dL   Microalbumin, Urine 9.1 Not Estab. ug/mL   Microalb/Creat Ratio 16 0 - 29 mg/g creat

## 2019-11-21 DIAGNOSIS — Z9181 History of falling: Secondary | ICD-10-CM

## 2019-11-21 DIAGNOSIS — M25511 Pain in right shoulder: Secondary | ICD-10-CM

## 2019-11-21 DIAGNOSIS — H8111 Benign paroxysmal vertigo, right ear: Secondary | ICD-10-CM

## 2019-11-22 ENCOUNTER — Ambulatory Visit (INDEPENDENT_AMBULATORY_CARE_PROVIDER_SITE_OTHER): Payer: Medicare Other | Admitting: Family Medicine

## 2019-11-22 ENCOUNTER — Other Ambulatory Visit: Payer: Self-pay

## 2019-11-22 DIAGNOSIS — G542 Cervical root disorders, not elsewhere classified: Secondary | ICD-10-CM | POA: Diagnosis not present

## 2019-11-22 DIAGNOSIS — I10 Essential (primary) hypertension: Secondary | ICD-10-CM | POA: Diagnosis not present

## 2019-11-22 MED ORDER — GABAPENTIN 100 MG PO CAPS: ORAL_CAPSULE | ORAL | 3 refills | Status: DC

## 2019-11-22 NOTE — Progress Notes (Signed)
   Subjective:    Patient ID: Christie [name], female    DOB: Jan 07, 1946, 73 y.o.   MRN: [id number]  Hypertension This is a chronic problem. Pertinent negatives include no chest pain or shortness of breath. (Pt has dizziness on occasion due to vertigo) There are no compliance problems.   Amlodipine was increased to 5 mg at last visit and pt is tolerating well. Blood pressure has been checked several times by her most the time its been looking good occasionally slightly elevated  Pt states she is still with right shoulder and arm pain but it is not as bad.  Patient relates intermittent pain discomfort radiates right side of the neck into the shoulder and upper arm no weakness into the hand.  Virtual Visit via Telephone Note  I connected with Christie [name] on 11/22/19 at 10:00 AM EST by telephone and verified that I am speaking with the correct person using two identifiers.  Location: Patient: home Provider: office    I discussed the limitations, risks, security and privacy concerns of performing an evaluation and management service by telephone and the availability of in person appointments. I also discussed with the patient that there may be a patient responsible charge related to this service. The patient expressed understanding and agreed to proceed.   History of Present Illness:    Observations/Objective:   Assessment and Plan:   Follow Up Instructions:    I discussed the assessment and treatment plan with the patient. The patient was provided an opportunity to ask questions and all were answered. The patient agreed with the plan and demonstrated an understanding of the instructions.   The patient was advised to call back or seek an in-person evaluation if the symptoms worsen or if the condition fails to improve as anticipated.  I provided 16 minutes of non-face-to-face time during this encounter.   Vicente Males, LPN  Review of Systems  Constitutional: Negative for activity  change, appetite change and fatigue.  HENT: Negative for congestion and rhinorrhea.   Respiratory: Negative for cough and shortness of breath.   Cardiovascular: Negative for chest pain and leg swelling.  Gastrointestinal: Negative for abdominal pain and diarrhea.  Endocrine: Negative for polydipsia and polyphagia.  Skin: Negative for color change.  Neurological: Negative for dizziness and weakness.  Psychiatric/Behavioral: Negative for behavioral problems and confusion.       Objective:   Physical Exam  Today's visit was via telephone Physical exam was not possible for this visit       Assessment & Plan:  Blood pressure-overall readings are doing well no need to make any adjustments currently follow-up if progressive troubles or problems  Cervical nerve root impingement-adjust the gabapentin New dosage 1 in the morning, 1 midday, 2 in the evening  I did discuss with the patient adjusting the gabapentin she states she is willing to do so to try to get the discomfort under better control discomfort is not severe enough to do MRI no weakness.  Patient will give [place] an update in 7 to 10 days how she is doing  No need to do MRI currently  Follow-up 3 months

## 2019-12-03 ENCOUNTER — Telehealth: Payer: Self-pay | Admitting: Family Medicine

## 2019-12-03 NOTE — Telephone Encounter (Signed)
Certainly glad all of that is doing better.  I do recommend follow-up if any ongoing troubles either with phone call/virtual visit or in person should further troubles occur otherwise follow-up every 6 months thanks Also Merry Christmas

## 2019-12-03 NOTE — Telephone Encounter (Signed)
Please advise. Thank you

## 2019-12-03 NOTE — Telephone Encounter (Signed)
Pt is calling to report that her neck, shoulder and arm pain is better. She is still having some pain but not like she was.

## 2019-12-03 NOTE — Telephone Encounter (Signed)
Pt contacted and verbalized understanding. Pt wishes all staff a Merry Christmas

## 2019-12-16 ENCOUNTER — Other Ambulatory Visit: Payer: Self-pay

## 2019-12-16 ENCOUNTER — Telehealth: Payer: Self-pay | Admitting: Family Medicine

## 2019-12-16 ENCOUNTER — Ambulatory Visit: Payer: Medicare Other | Attending: Internal Medicine

## 2019-12-16 DIAGNOSIS — Z20822 Contact with and (suspected) exposure to covid-19: Secondary | ICD-10-CM

## 2019-12-16 MED ORDER — ALPRAZOLAM 0.5 MG PO TABS: ORAL_TABLET | ORAL | 1 refills | Status: DC

## 2019-12-16 NOTE — Telephone Encounter (Signed)
Medication pended. Left message to return call 

## 2019-12-16 NOTE — Telephone Encounter (Signed)
Prescription was signed thank you °

## 2019-12-16 NOTE — Telephone Encounter (Signed)
Called pt to clarify and she states pharm told her xanax could not have refills and they would need a script each month. When I called and asked how she was doing she said not too good. She work up with headache, bodyaches and cough. I offered her an appt today and she declined. I gave her the testing site infor and she states she may go get tested and will call back if symptoms get worse and I went over warning signs with her.

## 2019-12-16 NOTE — Telephone Encounter (Signed)
So with her prescription for Xanax is just for evening time to help with rest and she will be getting this through [facility] I am okay with sending it to them if the patient desires [place] to send it to [facility] Then I am okay with doing 90 with 1 additional refill And then see what they have to say  If you want to go ahead and do the prescription and pend the prescription then I will sign it that would be fine  As for her symptoms she must go do testing also let the patient know we can easily do a virtual visit this afternoon and it is not a bother but her choice

## 2019-12-16 NOTE — Telephone Encounter (Signed)
Discussed with pt and she verbalized understanding. She still declined the appt today. Med is pended and ready for you to sign.

## 2019-12-16 NOTE — Telephone Encounter (Signed)
Patient will have new insurance in January .[employer] will be her new insurance and they are requiring a prescription for xanax every month for them to cover it. Patient was wondering if you are willing to write every month for it.Please advise

## 2019-12-18 ENCOUNTER — Telehealth: Payer: Self-pay | Admitting: *Deleted

## 2019-12-18 LAB — NOVEL CORONAVIRUS, NAA: SARS-CoV-2, NAA: DETECTED — AB

## 2019-12-18 NOTE — Telephone Encounter (Signed)
Pt called to check the status of her covid 19 test result. Per lab results, the results are being verified. Pt advised of this and she voiced understanding.

## 2019-12-19 ENCOUNTER — Telehealth: Payer: Self-pay | Admitting: Nurse Practitioner

## 2019-12-19 ENCOUNTER — Other Ambulatory Visit: Payer: Self-pay

## 2019-12-19 ENCOUNTER — Ambulatory Visit (INDEPENDENT_AMBULATORY_CARE_PROVIDER_SITE_OTHER): Payer: Medicare Other | Admitting: Family Medicine

## 2019-12-19 ENCOUNTER — Telehealth: Payer: Self-pay | Admitting: *Deleted

## 2019-12-19 ENCOUNTER — Encounter: Payer: Self-pay | Admitting: *Deleted

## 2019-12-19 DIAGNOSIS — U071 COVID-19: Secondary | ICD-10-CM

## 2019-12-19 NOTE — Telephone Encounter (Signed)
Patient has virtual visit today with Dr Richardson Landry to discuss test results.

## 2019-12-19 NOTE — Progress Notes (Signed)
   Subjective:  Audio only  Patient ID: Christie [name], female    DOB: [birth year], 73 y.o.   MRN: [id number]  Cough This is a new problem. Associated symptoms comments: Headache, body aches, tested positive on 12/16/2019, tired/no energy, loose stool . Treatments tried: vit c, vit d, zinc    Pt wanting to know if she is eligible for monoclonial antibody.   Virtual Visit via Telephone Note  I connected with Christie [name] on 12/19/19 at  3:00 PM EST by telephone and verified that I am speaking with the correct person using two identifiers.  Location: Patient: home Provider: office   I discussed the limitations, risks, security and privacy concerns of performing an evaluation and management service by telephone and the availability of in person appointments. I also discussed with the patient that there may be a patient responsible charge related to this service. The patient expressed understanding and agreed to proceed.   History of Present Illness:    Observations/Objective:   Assessment and Plan:   Follow Up Instructions:    I discussed the assessment and treatment plan with the patient. The patient was provided an opportunity to ask questions and all were answered. The patient agreed with the plan and demonstrated an understanding of the instructions.   The patient was advised to call back or seek an in-person evaluation if the symptoms worsen or if the condition fails to improve as anticipated.  I provided 18 minutes of non-face-to-face time during this encounter.   Vicente Males, LPN   Review of Systems  Respiratory: Positive for cough.        Objective:   Physical Exam  Virtual      Assessment & Plan:  Impression COVID-19 infection.  Symptom care discussed.  Warning signs discussed.  Patient is in the window for potential immunoglobulin therapy.  1 challenges this is New Year's Eve and slots are filled for several days.  Discussed.  Number given.  Advised to call  once enough.  Warning signs discussed carefully  Greater than 50% of this 15 minute non-face to face visit was spent in counseling and discussion and coordination of care regarding the above diagnosis/diagnosies

## 2019-12-19 NOTE — Telephone Encounter (Signed)
Called to Discuss with patient about Covid symptoms and the use of bamlanivimab, a monoclonal antibody infusion for those with mild to moderate Covid symptoms and at a high risk of hospitalization.     Pt is qualified for this infusion at the [facility] due to co-morbid conditions and/or a member of an at-risk group.     Unable to reach pt  

## 2019-12-19 NOTE — Telephone Encounter (Signed)
Covid test positive - patient is aware and warning signs discussed by [facility]-see previous phone message

## 2019-12-20 ENCOUNTER — Telehealth: Payer: Self-pay | Admitting: Unknown Physician Specialty

## 2019-12-20 ENCOUNTER — Other Ambulatory Visit: Payer: Self-pay | Admitting: Unknown Physician Specialty

## 2019-12-20 DIAGNOSIS — U071 COVID-19: Secondary | ICD-10-CM

## 2019-12-20 NOTE — Telephone Encounter (Signed)
  I connected by phone with Christie [name] on 12/20/2019 at 12:09 PM to discuss the potential use of an new treatment for mild to moderate COVID-19 viral infection in non-hospitalized patients.  This patient is a 74 y.o. female that meets the FDA criteria for Emergency Use Authorization of bamlanivimab or casirivimab\imdevimab.  Has a (+) direct SARS-CoV-2 viral test result  Has mild or moderate COVID-19   Is ? 76 years of age and weighs ? 40 kg  Is NOT hospitalized due to COVID-19  Is NOT requiring oxygen therapy or requiring an increase in baseline oxygen flow rate due to COVID-19  Is within 10 days of symptom onset  Has at least one of the high risk factor(s) for progression to severe COVID-19 and/or hospitalization as defined in EUA.  Specific high risk criteria : >/= 76 yo   I have spoken and communicated the following to the patient or parent/caregiver:  1. FDA has authorized the emergency use of bamlanivimab and casirivimab\imdevimab for the treatment of mild to moderate COVID-19 in adults and pediatric patients with positive results of direct SARS-CoV-2 viral testing who are 5 years of age and older weighing at least 40 kg, and who are at high risk for progressing to severe COVID-19 and/or hospitalization.  2. The significant known and potential risks and benefits of bamlanivimab and casirivimab\imdevimab, and the extent to which such potential risks and benefits are unknown.  3. Information on available alternative treatments and the risks and benefits of those alternatives, including clinical trials.  4. Patients treated with bamlanivimab and casirivimab\imdevimab should continue to self-isolate and use infection control measures (e.g., wear mask, isolate, social distance, avoid sharing personal items, clean and disinfect "high touch" surfaces, and frequent handwashing) according to CDC guidelines.   5. The patient or parent/caregiver has the option to accept or refuse  bamlanivimab or casirivimab\imdevimab .  After reviewing this information with the patient, The patient agreed to proceed with receiving the bamlanimivab infusion and will be provided a copy of the Fact sheet prior to receiving the infusion.[name] 12/20/2019 12:09 PM  Symptom onset 12/27 - aching and fatigue. Now just fatigued.

## 2019-12-20 NOTE — Telephone Encounter (Signed)
Called to discuss with patient about Covid symptoms and the use of bamlanivimab, a monoclonal antibody infusion for those with mild to moderate Covid symptoms and at a high risk of hospitalization.  Pt is qualified for this infusion at the [facility] due to Age > 65   Message left to call back  

## 2019-12-23 ENCOUNTER — Telehealth: Payer: Self-pay | Admitting: Family Medicine

## 2019-12-23 MED ORDER — ALPRAZOLAM 0.5 MG PO TABS: ORAL_TABLET | ORAL | 4 refills | Status: DC

## 2019-12-23 NOTE — Telephone Encounter (Signed)
Pt would like Xanax sent to [facility]. Please advise. Thank you

## 2019-12-23 NOTE — Telephone Encounter (Signed)
[facility] calling stating pt is requesting ALPRAZolam (XANAX) 0.5 MG tablet be sent to [facility] instead.

## 2019-12-23 NOTE — Telephone Encounter (Signed)
Medication will be faxed to [facility] once signed; pt aware and verbalized understanding.

## 2019-12-23 NOTE — Telephone Encounter (Signed)
4 refills for Xanax to [facility] as requested

## 2019-12-24 ENCOUNTER — Ambulatory Visit (HOSPITAL_COMMUNITY)
Admission: RE | Admit: 2019-12-24 | Discharge: 2019-12-24 | Disposition: A | Discharge status: Home / Self Care | Payer: Medicare Other | Source: Ambulatory Visit | Attending: Pulmonary Disease | Admitting: Pulmonary Disease

## 2019-12-24 DIAGNOSIS — U071 COVID-19: Secondary | ICD-10-CM | POA: Diagnosis present

## 2019-12-24 DIAGNOSIS — Z23 Encounter for immunization: Secondary | ICD-10-CM | POA: Diagnosis not present

## 2019-12-24 MED ORDER — DIPHENHYDRAMINE HCL 50 MG/ML IJ SOLN: 50.0000 mg | Freq: Once | INTRAMUSCULAR | Status: DC | PRN

## 2019-12-24 MED ORDER — SODIUM CHLORIDE 0.9 % IV SOLN
700.0000 mg | Freq: Once | INTRAVENOUS | Status: AC
  Administered 2019-12-24: 700 mg via INTRAVENOUS
  Filled 2019-12-24: qty 20

## 2019-12-24 MED ORDER — FAMOTIDINE IN NACL 20-0.9 MG/50ML-% IV SOLN: 20.0000 mg | Freq: Once | INTRAVENOUS | Status: DC | PRN

## 2019-12-24 MED ORDER — METHYLPREDNISOLONE SODIUM SUCC 125 MG IJ SOLR: 125.0000 mg | Freq: Once | INTRAMUSCULAR | Status: DC | PRN

## 2019-12-24 MED ORDER — EPINEPHRINE 0.3 MG/0.3ML IJ SOAJ: 0.3000 mg | Freq: Once | INTRAMUSCULAR | Status: DC | PRN

## 2019-12-24 MED ORDER — SODIUM CHLORIDE 0.9 % IV SOLN
INTRAVENOUS | Status: DC | PRN
  Administered 2019-12-24: 13:00:00 250 mL via INTRAVENOUS

## 2019-12-24 MED ORDER — ALBUTEROL SULFATE HFA 108 (90 BASE) MCG/ACT IN AERS: 2.0000 | INHALATION_SPRAY | Freq: Once | RESPIRATORY_TRACT | Status: DC | PRN

## 2019-12-24 NOTE — Discharge Instructions (Signed)

## 2019-12-24 NOTE — Progress Notes (Signed)
Patient ID: Christie [name], female   DOB: December 15, 1946, 74 y.o.   MRN: [id number]    Diagnosis: COVID-19  Physician: Dr. Moise Boring  Procedure: [facility] Med: bamlanivimab infusion - Provided patient with bamlanimivab fact sheet for patients, parents and caregivers prior to infusion.  Complications: No immediate complications noted.  Discharge: Discharged home   Jazzelle Zhang, Cleaster Corin 12/24/2019

## 2020-01-07 ENCOUNTER — Ambulatory Visit: Payer: Medicare Other | Admitting: Family Medicine

## 2020-01-13 ENCOUNTER — Other Ambulatory Visit: Payer: Self-pay

## 2020-01-13 ENCOUNTER — Ambulatory Visit (INDEPENDENT_AMBULATORY_CARE_PROVIDER_SITE_OTHER): Payer: Medicare PPO | Admitting: Family Medicine

## 2020-01-13 DIAGNOSIS — I10 Essential (primary) hypertension: Secondary | ICD-10-CM

## 2020-01-13 DIAGNOSIS — Z8616 Personal history of COVID-19: Secondary | ICD-10-CM

## 2020-01-13 NOTE — Progress Notes (Signed)
   Subjective:    Patient ID: Christie [name], female    DOB: 1946-03-10, 74 y.o.   MRN: [id number]  HPI   Patient calls to discuss Covid vaccine. Patient states she had Covid a few weeks ago and had a mild case but did get the antibody infusion and was wondering about the vaccine. She was told she needed to wait 90 days for vaccine but patient not sure she wants to get the vaccine.  Patient also has blood pressure issues she states is been reading a little higher than what it should be.  She is trying to get this under better control. Virtual Visit via Video Note  I connected with Christie [name] on 01/13/20 at  9:00 AM EST by a video enabled telemedicine application and verified that I am speaking with the correct person using two identifiers.  Location: Patient: home Provider: office   I discussed the limitations of evaluation and management by telemedicine and the availability of in person appointments. The patient expressed understanding and agreed to proceed.  History of Present Illness:    Observations/Objective:   Assessment and Plan:   Follow Up Instructions:    I discussed the assessment and treatment plan with the patient. The patient was provided an opportunity to ask questions and all were answered. The patient agreed with the plan and demonstrated an understanding of the instructions.   The patient was advised to call back or seek an in-person evaluation if the symptoms worsen or if the condition fails to improve as anticipated.  I provided 20 minutes of non-face-to-face time during this encounter.     Review of Systems  Constitutional: Negative for activity change and appetite change.  HENT: Negative for congestion and rhinorrhea.   Respiratory: Negative for cough and shortness of breath.   Cardiovascular: Negative for chest pain and leg swelling.  Gastrointestinal: Negative for abdominal pain, nausea and vomiting.  Skin: Negative for color change.  Neurological:  Negative for dizziness and weakness.  Psychiatric/Behavioral: Negative for agitation and confusion.       Objective:   Physical Exam  Today's visit was via telephone Physical exam was not possible for this visit       Assessment & Plan:  Patient's blood pressure not where it should be.  Running a little higher than normal.  She states she will work hard diet and start moving around the house more and potentially go walking more to try to get down she will send [place] an update in several weeks keep medication as it is  Recent Covid infection she is getting over this energy level doing better we discussed in detail the importance of vaccines I encouraged her to get it in February or March when it is available for her.

## 2020-01-14 DIAGNOSIS — Z1231 Encounter for screening mammogram for malignant neoplasm of breast: Secondary | ICD-10-CM | POA: Diagnosis not present

## 2020-01-29 ENCOUNTER — Other Ambulatory Visit: Payer: Self-pay | Admitting: Family Medicine

## 2020-02-15 ENCOUNTER — Other Ambulatory Visit: Payer: Self-pay | Admitting: Family Medicine

## 2020-02-19 ENCOUNTER — Encounter: Payer: Medicare PPO | Admitting: Family Medicine

## 2020-02-22 ENCOUNTER — Other Ambulatory Visit: Payer: Self-pay | Admitting: Family Medicine

## 2020-02-24 NOTE — Telephone Encounter (Signed)
May have 6 months ?

## 2020-02-24 NOTE — Telephone Encounter (Signed)
Last med check up 11/22/19

## 2020-02-25 ENCOUNTER — Encounter: Payer: Self-pay | Admitting: Family Medicine

## 2020-02-25 ENCOUNTER — Ambulatory Visit (INDEPENDENT_AMBULATORY_CARE_PROVIDER_SITE_OTHER): Payer: Medicare PPO | Admitting: Family Medicine

## 2020-02-25 ENCOUNTER — Other Ambulatory Visit: Payer: Self-pay

## 2020-02-25 VITALS — BP 126/72 | Temp 98.2°F | Ht 62.0 in | Wt 179.8 lb

## 2020-02-25 DIAGNOSIS — Z0000 Encounter for general adult medical examination without abnormal findings: Secondary | ICD-10-CM | POA: Diagnosis not present

## 2020-02-25 DIAGNOSIS — E782 Mixed hyperlipidemia: Secondary | ICD-10-CM

## 2020-02-25 DIAGNOSIS — E119 Type 2 diabetes mellitus without complications: Secondary | ICD-10-CM

## 2020-02-25 DIAGNOSIS — E785 Hyperlipidemia, unspecified: Secondary | ICD-10-CM

## 2020-02-25 DIAGNOSIS — I10 Essential (primary) hypertension: Secondary | ICD-10-CM

## 2020-02-25 DIAGNOSIS — E1169 Type 2 diabetes mellitus with other specified complication: Secondary | ICD-10-CM | POA: Diagnosis not present

## 2020-02-25 HISTORY — DX: Type 2 diabetes mellitus with other specified complication: E11.69

## 2020-02-25 NOTE — Progress Notes (Signed)
Subjective:    Patient ID: Christie [name], female    DOB: 15-Jan-1946, 74 y.o.   MRN: [id number]  HPI  AWV- Annual Wellness Visit  The patient was seen for their annual wellness visit. The patient's past medical history, surgical history, and family history were reviewed. Pertinent vaccines were reviewed ( tetanus, pneumonia, shingles, flu) The patient's medication list was reviewed and updated.  The height and weight were entered.  BMI recorded in electronic record elsewhere  Cognitive screening was completed. Outcome of Mini - Cog: pass   Falls /depression screening electronically recorded within record elsewhere Fall Risk  02/25/2020 11/08/2019 09/09/2019 09/09/2019 07/18/2018  Falls in the past year? 0 0 0 0 No  Number falls in past yr: - - 0 0 -  Injury with Fall? - - 0 0 -  Follow up Falls evaluation completed Falls evaluation completed Falls evaluation completed Falls evaluation completed -    Current tobacco usage:none (All patients who use tobacco were given written and verbal information on quitting)  Recent listing of emergency department/hospitalizations over the past year were reviewed.  current specialist the patient sees on a regular basis: none   Medicare annual wellness visit patient questionnaire was reviewed.  A written screening schedule for the patient for the next 5-10 years was given. Appropriate discussion of followup regarding next visit was discussed. Results for orders placed or performed in visit on 12/16/19  Novel Coronavirus, NAA ([facility])   Specimen: Nasopharyngeal(NP) swabs in vial transport medium   NASOPHARYNGE  TESTING  Result Value Ref Range   SARS-CoV-2, NAA Detected (A) Not Detected       Review of Systems  Constitutional: Negative for activity change, appetite change and fatigue.  HENT: Negative for congestion and rhinorrhea.   Eyes: Negative for discharge.  Respiratory: Negative for cough, chest tightness and wheezing.    Cardiovascular: Negative for chest pain.  Gastrointestinal: Negative for abdominal pain, blood in stool and vomiting.  Endocrine: Negative for polyphagia.  Genitourinary: Negative for difficulty urinating and frequency.  Musculoskeletal: Negative for neck pain.  Skin: Negative for color change.  Allergic/Immunologic: Negative for environmental allergies and food allergies.  Neurological: Negative for weakness and headaches.  Psychiatric/Behavioral: Negative for agitation and behavioral problems.       Objective:   Physical Exam Constitutional:      Appearance: She is well-developed.  HENT:     Head: Normocephalic.     Right Ear: External ear normal.     Left Ear: External ear normal.  Eyes:     Pupils: Pupils are equal, round, and reactive to light.  Neck:     Thyroid: No thyromegaly.  Cardiovascular:     Rate and Rhythm: Normal rate and regular rhythm.     Heart sounds: Normal heart sounds. No murmur.  Pulmonary:     Effort: Pulmonary effort is normal. No respiratory distress.     Breath sounds: Normal breath sounds. No wheezing.  Abdominal:     General: Bowel sounds are normal. There is no distension.     Palpations: Abdomen is soft. There is no mass.     Tenderness: There is no abdominal tenderness.  Musculoskeletal:        General: No tenderness. Normal range of motion.     Cervical back: Normal range of motion.  Lymphadenopathy:     Cervical: No cervical adenopathy.  Skin:    General: Skin is warm and dry.  Neurological:     Mental Status: She is  alert and oriented to person, place, and time.     Motor: No abnormal muscle tone.  Psychiatric:        Behavior: Behavior normal.    Breast exam bilateral is normal Pelvic exam she has a vaginal diverticulum which is stable.  No sign of any cancer.  Pap smear not indicated.       Assessment & Plan:  1. Mixed hyperlipidemia Patient to watch diet closely check lab work later in the spring  2. Essential  hypertension, benign Blood pressure good control continue current measures  3. Medicare annual wellness visit, subsequent Also wellness doing fine Adult wellness-complete.wellness physical was conducted today. Importance of diet and exercise were discussed in detail.  In addition to this a discussion regarding safety was also covered. We also reviewed over immunizations and gave recommendations regarding current immunization needed for age.  In addition to this additional areas were also touched on including: Preventative health exams needed:  Colonoscopy up-to-date on colonoscopy  Patient was advised yearly wellness exam Patient will do lab work in a couple months and follow-up within 6 months

## 2020-02-29 ENCOUNTER — Other Ambulatory Visit: Payer: Self-pay | Admitting: Family Medicine

## 2020-03-12 ENCOUNTER — Other Ambulatory Visit: Payer: Self-pay | Admitting: Family Medicine

## 2020-03-30 DIAGNOSIS — H524 Presbyopia: Secondary | ICD-10-CM | POA: Diagnosis not present

## 2020-03-30 LAB — HM DIABETES EYE EXAM

## 2020-03-31 ENCOUNTER — Other Ambulatory Visit: Payer: Self-pay | Admitting: Family Medicine

## 2020-04-30 DIAGNOSIS — I10 Essential (primary) hypertension: Secondary | ICD-10-CM | POA: Diagnosis not present

## 2020-04-30 DIAGNOSIS — E782 Mixed hyperlipidemia: Secondary | ICD-10-CM | POA: Diagnosis not present

## 2020-04-30 DIAGNOSIS — E119 Type 2 diabetes mellitus without complications: Secondary | ICD-10-CM | POA: Diagnosis not present

## 2020-05-01 LAB — BASIC METABOLIC PANEL
BUN/Creatinine Ratio: 31 — ABNORMAL HIGH (ref 12–28)
BUN: 17 mg/dL (ref 8–27)
CO2: 22 mmol/L (ref 20–29)
Calcium: 9.7 mg/dL (ref 8.7–10.3)
Chloride: 105 mmol/L (ref 96–106)
Creatinine, Ser: 0.55 mg/dL — ABNORMAL LOW (ref 0.57–1.00)
GFR calc Af Amer: 106 mL/min/{1.73_m2} (ref >59)
GFR calc non Af Amer: 92 mL/min/{1.73_m2} (ref >59)
Glucose: 131 mg/dL — ABNORMAL HIGH (ref 65–99)
Potassium: 4.4 mmol/L (ref 3.5–5.2)
Sodium: 142 mmol/L (ref 134–144)

## 2020-05-01 LAB — HEPATIC FUNCTION PANEL
ALT: 27 IU/L (ref 0–32)
AST: 21 IU/L (ref 0–40)
Albumin: 4.6 g/dL (ref 3.7–4.7)
Alkaline Phosphatase: 133 IU/L — ABNORMAL HIGH (ref 39–117)
Bilirubin Total: 0.5 mg/dL (ref 0.0–1.2)
Bilirubin, Direct: 0.15 mg/dL (ref 0.00–0.40)
Total Protein: 6.9 g/dL (ref 6.0–8.5)

## 2020-05-01 LAB — LIPID PANEL
Chol/HDL Ratio: 2.4 ratio (ref 0.0–4.4)
Cholesterol, Total: 162 mg/dL (ref 100–199)
HDL: 67 mg/dL (ref >39)
LDL Chol Calc (NIH): 75 mg/dL (ref 0–99)
Triglycerides: 113 mg/dL (ref 0–149)
VLDL Cholesterol Cal: 20 mg/dL (ref 5–40)

## 2020-05-01 LAB — HEMOGLOBIN A1C
Est. average glucose Bld gHb Est-mCnc: 154 mg/dL
Hgb A1c MFr Bld: 7 % — ABNORMAL HIGH (ref 4.8–5.6)

## 2020-05-05 ENCOUNTER — Other Ambulatory Visit: Payer: Self-pay | Admitting: Family Medicine

## 2020-05-05 MED ORDER — ROSUVASTATIN CALCIUM 40 MG PO TABS: ORAL_TABLET | ORAL | 1 refills | Status: DC

## 2020-05-08 ENCOUNTER — Telehealth: Payer: Self-pay | Admitting: Family Medicine

## 2020-05-08 ENCOUNTER — Other Ambulatory Visit: Payer: Self-pay

## 2020-05-08 MED ORDER — ROSUVASTATIN CALCIUM 40 MG PO TABS: ORAL_TABLET | ORAL | 1 refills | Status: DC

## 2020-05-08 NOTE — Telephone Encounter (Signed)
Patient called  she has not received her  rosuvastatin (CRESTOR) 40 MG tablet  I informed the patient that med was sent to [facility] on 5/18

## 2020-05-08 NOTE — Telephone Encounter (Signed)
Resent medication to [facility] and pt is aware

## 2020-05-11 ENCOUNTER — Other Ambulatory Visit: Payer: Self-pay | Admitting: Family Medicine

## 2020-06-10 ENCOUNTER — Encounter: Payer: Self-pay | Admitting: Family Medicine

## 2020-06-22 ENCOUNTER — Other Ambulatory Visit: Payer: Self-pay | Admitting: Family Medicine

## 2020-06-30 ENCOUNTER — Other Ambulatory Visit: Payer: Self-pay | Admitting: Family Medicine

## 2020-07-22 ENCOUNTER — Telehealth: Payer: Self-pay | Admitting: Family Medicine

## 2020-07-22 DIAGNOSIS — E119 Type 2 diabetes mellitus without complications: Secondary | ICD-10-CM

## 2020-07-22 DIAGNOSIS — D696 Thrombocytopenia, unspecified: Secondary | ICD-10-CM

## 2020-07-22 NOTE — Telephone Encounter (Signed)
Last labs 04/30/20: Lipid, Liver, Met 7 and HgbA1c

## 2020-07-22 NOTE — Telephone Encounter (Signed)
Patient has appointment on 9/17 and wanting to know if she has to do blood work.

## 2020-07-24 NOTE — Telephone Encounter (Signed)
It is reasonable to do A1c, urine ACR, met 7, CBC Diabetes, thrombocytopenia

## 2020-07-24 NOTE — Telephone Encounter (Signed)
Blood work ordered in Christie [name] not set up

## 2020-07-24 NOTE — Telephone Encounter (Signed)
Patient notified

## 2020-07-31 ENCOUNTER — Other Ambulatory Visit: Payer: Self-pay | Admitting: Family Medicine

## 2020-07-31 MED ORDER — GLIPIZIDE 5 MG PO TABS: ORAL_TABLET | ORAL | 0 refills | Status: DC

## 2020-08-05 ENCOUNTER — Other Ambulatory Visit: Payer: Self-pay | Admitting: Family Medicine

## 2020-08-27 DIAGNOSIS — E119 Type 2 diabetes mellitus without complications: Secondary | ICD-10-CM | POA: Diagnosis not present

## 2020-08-27 DIAGNOSIS — D696 Thrombocytopenia, unspecified: Secondary | ICD-10-CM | POA: Diagnosis not present

## 2020-08-28 ENCOUNTER — Other Ambulatory Visit: Payer: Self-pay | Admitting: Family Medicine

## 2020-08-28 LAB — CBC WITH DIFFERENTIAL/PLATELET
Basophils Absolute: 0.1 10*3/uL (ref 0.0–0.2)
Basos: 1 %
EOS (ABSOLUTE): 0.2 10*3/uL (ref 0.0–0.4)
Eos: 3 %
Hematocrit: 47.6 % — ABNORMAL HIGH (ref 34.0–46.6)
Hemoglobin: 16.1 g/dL — ABNORMAL HIGH (ref 11.1–15.9)
Immature Grans (Abs): 0 10*3/uL (ref 0.0–0.1)
Immature Granulocytes: 0 %
Lymphocytes Absolute: 2.2 10*3/uL (ref 0.7–3.1)
Lymphs: 31 %
MCH: 31.2 pg (ref 26.6–33.0)
MCHC: 33.8 g/dL (ref 31.5–35.7)
MCV: 92 fL (ref 79–97)
Monocytes Absolute: 0.5 10*3/uL (ref 0.1–0.9)
Monocytes: 8 %
Neutrophils Absolute: 4 10*3/uL (ref 1.4–7.0)
Neutrophils: 57 %
Platelets: 169 10*3/uL (ref 150–450)
RBC: 5.16 x10E6/uL (ref 3.77–5.28)
RDW: 13.1 % (ref 11.7–15.4)
WBC: 7 10*3/uL (ref 3.4–10.8)

## 2020-08-28 LAB — BASIC METABOLIC PANEL
BUN/Creatinine Ratio: 28 (ref 12–28)
BUN: 16 mg/dL (ref 8–27)
CO2: 25 mmol/L (ref 20–29)
Calcium: 10 mg/dL (ref 8.7–10.3)
Chloride: 102 mmol/L (ref 96–106)
Creatinine, Ser: 0.58 mg/dL (ref 0.57–1.00)
GFR calc Af Amer: 104 mL/min/{1.73_m2} (ref >59)
GFR calc non Af Amer: 90 mL/min/{1.73_m2} (ref >59)
Glucose: 136 mg/dL — ABNORMAL HIGH (ref 65–99)
Potassium: 4.3 mmol/L (ref 3.5–5.2)
Sodium: 141 mmol/L (ref 134–144)

## 2020-08-28 LAB — MICROALBUMIN / CREATININE URINE RATIO
Creatinine, Urine: 65.7 mg/dL
Microalb/Creat Ratio: 37 mg/g creat — ABNORMAL HIGH (ref 0–29)
Microalbumin, Urine: 24.4 ug/mL

## 2020-08-28 LAB — HEMOGLOBIN A1C
Est. average glucose Bld gHb Est-mCnc: 169 mg/dL
Hgb A1c MFr Bld: 7.5 % — ABNORMAL HIGH (ref 4.8–5.6)

## 2020-08-28 NOTE — Telephone Encounter (Signed)
02/25/20 was last check up and has upcoming appt next week

## 2020-09-04 ENCOUNTER — Encounter: Payer: Self-pay | Admitting: Family Medicine

## 2020-09-04 ENCOUNTER — Ambulatory Visit: Payer: Medicare PPO | Admitting: Family Medicine

## 2020-09-04 ENCOUNTER — Other Ambulatory Visit: Payer: Self-pay

## 2020-09-04 VITALS — BP 130/82 | HR 93 | Temp 97.6°F | Wt 180.2 lb

## 2020-09-04 DIAGNOSIS — E1169 Type 2 diabetes mellitus with other specified complication: Secondary | ICD-10-CM

## 2020-09-04 DIAGNOSIS — E785 Hyperlipidemia, unspecified: Secondary | ICD-10-CM

## 2020-09-04 DIAGNOSIS — M1812 Unilateral primary osteoarthritis of first carpometacarpal joint, left hand: Secondary | ICD-10-CM

## 2020-09-04 DIAGNOSIS — I10 Essential (primary) hypertension: Secondary | ICD-10-CM | POA: Diagnosis not present

## 2020-09-04 NOTE — Progress Notes (Signed)
Subjective:    Patient ID: Christie [name], female    DOB: Jul 02, 1946, 74 y.o.   MRN: [id number] Very nice patient Hypertension This is a chronic problem. Pertinent negatives include no chest pain or shortness of breath. (Dizziness at dentist office on Wednesday (resolved)) There are no compliance problems.    Osteoarthritis of left thumb  Essential hypertension, benign  Hyperlipidemia associated with type 2 diabetes mellitus ([place])  Patient's diabetes not doing as good as what would like to see She relates that she enjoys ice cream she will try to cut back We did discuss other options including increasing Jardiance I have also encouraged her to become more active  Patient with osteoarthritis of the left thumb with inflammation and discomfort.  Hurts with certain movements.  Denies any injury to it.  Patient does take her cholesterol medicine regular basis tolerates it well  Takes her blood pressure medicine tries to minimize salt in the diet does well with her medicine  Patient's moods still fluctuate 9 years after the loss of her husband we did discuss whether or not to bump up the dose or keep it as is she would like to keep it as is for now   Review of Systems  Constitutional: Negative for activity change, appetite change and fatigue.  HENT: Negative for congestion and rhinorrhea.   Respiratory: Negative for cough and shortness of breath.   Cardiovascular: Negative for chest pain and leg swelling.  Gastrointestinal: Negative for abdominal pain and diarrhea.  Endocrine: Negative for polydipsia and polyphagia.  Skin: Negative for color change.  Neurological: Negative for dizziness and weakness.  Psychiatric/Behavioral: Negative for behavioral problems and confusion.       Objective:   Physical Exam Vitals reviewed.  Constitutional:      General: She is not in acute distress. HENT:     Head: Normocephalic and atraumatic.  Eyes:     General:        Right eye: No  discharge.        Left eye: No discharge.  Neck:     Trachea: No tracheal deviation.  Cardiovascular:     Rate and Rhythm: Normal rate and regular rhythm.     Heart sounds: Normal heart sounds. No murmur heard.   Pulmonary:     Effort: Pulmonary effort is normal. No respiratory distress.     Breath sounds: Normal breath sounds.  Lymphadenopathy:     Cervical: No cervical adenopathy.  Skin:    General: Skin is warm and dry.  Neurological:     Mental Status: She is alert.     Coordination: Coordination normal.  Psychiatric:        Behavior: Behavior normal.     Results for orders placed or performed in visit on 07/22/20  Hemoglobin A1c  Result Value Ref Range   Hgb A1c MFr Bld 7.5 (H) 4.8 - 5.6 %   Est. average glucose Bld gHb Est-mCnc 169 mg/dL  Microalbumin / creatinine urine ratio  Result Value Ref Range   Creatinine, Urine 65.7 Not Estab. mg/dL   Microalbumin, Urine 24.4 Not Estab. ug/mL   Microalb/Creat Ratio 37 (H) 0 - 29 mg/g creat  Basic metabolic panel  Result Value Ref Range   Glucose 136 (H) 65 - 99 mg/dL   BUN 16 8 - 27 mg/dL   Creatinine, Ser 0.58 0.57 - 1.00 mg/dL   GFR calc non Af Amer 90 >59 mL/min/1.73   GFR calc Af Amer 104 >59 mL/min/1.73  BUN/Creatinine Ratio 28 12 - 28   Sodium 141 134 - 144 mmol/L   Potassium 4.3 3.5 - 5.2 mmol/L   Chloride 102 96 - 106 mmol/L   CO2 25 20 - 29 mmol/L   Calcium 10.0 8.7 - 10.3 mg/dL  CBC with Differential/Platelet  Result Value Ref Range   WBC 7.0 3.4 - 10.8 x10E3/uL   RBC 5.16 3.77 - 5.28 x10E6/uL   Hemoglobin 16.1 (H) 11.1 - 15.9 g/dL   Hematocrit 47.6 (H) 34.0 - 46.6 %   MCV 92 79 - 97 fL   MCH 31.2 26.6 - 33.0 pg   MCHC 33.8 31 - 35 g/dL   RDW 13.1 11.7 - 15.4 %   Platelets 169 150 - 450 x10E3/uL   Neutrophils 57 Not Estab. %   Lymphs 31 Not Estab. %   Monocytes 8 Not Estab. %   Eos 3 Not Estab. %   Basos 1 Not Estab. %   Neutrophils Absolute 4.0 1 - 7 x10E3/uL   Lymphocytes Absolute 2.2 0 - 3  x10E3/uL   Monocytes Absolute 0.5 0 - 0 x10E3/uL   EOS (ABSOLUTE) 0.2 0.0 - 0.4 x10E3/uL   Basophils Absolute 0.1 0 - 0 x10E3/uL   Immature Granulocytes 0 Not Estab. %   Immature Grans (Abs) 0.0 0.0 - 0.1 x10E3/uL           Assessment & Plan:  Subpar control of diabetes Increase Jardiance to 25 mg daily Watch diet closely  Mild obesity watch diet stay active try to lose weight  Intermittent depression and anxiety related to the loss of her husband 9 years ago patient still utilizing medicine does not want to increase the dosage  Hyperlipidemia associated with diabetes continue current medication watch diet  Blood pressure good control continue current measures watch diet  Patient does not want to do Covid vaccine She has had Covid infection prior She also does not want to do flu vaccine She states she is reading a lot of information that makes her question these vaccines She wondered what I thought about Dr. Brenton Grills I have encouraged her to get information from reliable scientific sources

## 2020-09-05 MED ORDER — AMLODIPINE BESYLATE 5 MG PO TABS: 5.0000 mg | ORAL_TABLET | Freq: Every day | ORAL | 5 refills | Status: DC

## 2020-09-05 MED ORDER — SERTRALINE HCL 50 MG PO TABS: ORAL_TABLET | ORAL | 5 refills | Status: DC

## 2020-09-05 MED ORDER — ALPRAZOLAM 0.5 MG PO TABS: ORAL_TABLET | ORAL | 4 refills | Status: DC

## 2020-09-05 MED ORDER — GLIPIZIDE 5 MG PO TABS: ORAL_TABLET | ORAL | 1 refills | Status: DC

## 2020-09-05 MED ORDER — EMPAGLIFLOZIN 25 MG PO TABS: 25.0000 mg | ORAL_TABLET | Freq: Every day | ORAL | 5 refills | Status: DC

## 2020-09-05 MED ORDER — LOSARTAN POTASSIUM 100 MG PO TABS: ORAL_TABLET | ORAL | 5 refills | Status: DC

## 2020-09-05 MED ORDER — ROSUVASTATIN CALCIUM 40 MG PO TABS: ORAL_TABLET | ORAL | 1 refills | Status: DC

## 2020-09-08 ENCOUNTER — Telehealth: Payer: Self-pay | Admitting: Family Medicine

## 2020-09-08 NOTE — Telephone Encounter (Signed)
Please set up patient for follow-up echo for aortic stenosis please notify patient

## 2020-09-08 NOTE — Telephone Encounter (Signed)
Pt is checking to see if echo has been ordered yet

## 2020-09-09 ENCOUNTER — Other Ambulatory Visit: Payer: Self-pay | Admitting: *Deleted

## 2020-09-09 DIAGNOSIS — I350 Nonrheumatic aortic (valve) stenosis: Secondary | ICD-10-CM

## 2020-09-09 NOTE — Telephone Encounter (Signed)
Echo ordered. Patient notified. Permission to leave message with appointment date/time.

## 2020-09-09 NOTE — Progress Notes (Signed)
echo

## 2020-09-15 ENCOUNTER — Other Ambulatory Visit: Payer: Self-pay | Admitting: Family Medicine

## 2020-09-15 ENCOUNTER — Other Ambulatory Visit (HOSPITAL_COMMUNITY): Payer: Medicare Other

## 2020-09-16 NOTE — Telephone Encounter (Signed)
ECHO scheduled, PA done

## 2020-09-18 ENCOUNTER — Other Ambulatory Visit: Payer: Self-pay

## 2020-09-18 ENCOUNTER — Ambulatory Visit (HOSPITAL_COMMUNITY)
Admission: RE | Admit: 2020-09-18 | Discharge: 2020-09-18 | Disposition: A | Discharge status: Home / Self Care | Payer: Medicare PPO | Source: Ambulatory Visit | Attending: Family Medicine | Admitting: Family Medicine

## 2020-09-18 DIAGNOSIS — I350 Nonrheumatic aortic (valve) stenosis: Secondary | ICD-10-CM | POA: Diagnosis not present

## 2020-09-18 LAB — ECHOCARDIOGRAM COMPLETE
AR max vel: 1.2 cm2
AV Area VTI: 1.28 cm2
AV Area mean vel: 1.14 cm2
AV Mean grad: 12 mmHg
AV Peak grad: 24.2 mmHg
Ao pk vel: 2.46 m/s
Area-P 1/2: 2.45 cm2
P 1/2 time: 539 msec
S' Lateral: 1.76 cm

## 2020-09-18 NOTE — Progress Notes (Signed)
*  PRELIMINARY RESULTS* Echocardiogram 2D Echocardiogram has been performed.  Christie [name] 09/18/2020, 3:38 PM

## 2020-09-22 ENCOUNTER — Other Ambulatory Visit (INDEPENDENT_AMBULATORY_CARE_PROVIDER_SITE_OTHER): Payer: Medicare PPO

## 2020-09-22 ENCOUNTER — Other Ambulatory Visit: Payer: Self-pay

## 2020-09-22 ENCOUNTER — Other Ambulatory Visit: Payer: Self-pay | Admitting: *Deleted

## 2020-09-22 DIAGNOSIS — Z23 Encounter for immunization: Secondary | ICD-10-CM | POA: Diagnosis not present

## 2020-09-22 DIAGNOSIS — R931 Abnormal findings on diagnostic imaging of heart and coronary circulation: Secondary | ICD-10-CM

## 2020-09-23 ENCOUNTER — Encounter: Payer: Self-pay | Admitting: Family Medicine

## 2020-10-01 ENCOUNTER — Ambulatory Visit: Payer: Medicare PPO | Admitting: Family Medicine

## 2020-10-01 ENCOUNTER — Encounter: Payer: Self-pay | Admitting: Family Medicine

## 2020-10-01 VITALS — BP 110/68 | HR 87 | Temp 98.2°F | Wt 178.0 lb

## 2020-10-01 DIAGNOSIS — R6884 Jaw pain: Secondary | ICD-10-CM | POA: Diagnosis not present

## 2020-10-01 DIAGNOSIS — H9202 Otalgia, left ear: Secondary | ICD-10-CM | POA: Diagnosis not present

## 2020-10-01 MED ORDER — DICLOFENAC SODIUM 1 % EX GEL: 2.0000 g | Freq: Four times a day (QID) | CUTANEOUS | Status: DC

## 2020-10-01 NOTE — Progress Notes (Signed)
Patient ID: Christie [name], female    DOB: July 03, 1946, 74 y.o.   MRN: [id number]   Chief Complaint  Patient presents with  . Ear Pain    Patient had dental procedure a couple of weeks ago and the dentist may have injected a medication into her muscle. She had pain in her cheek and went back and was put on prednisone for 1 week. Now is still experiencing discomfort when opening her mouth wide and having pressure in left ear.    Subjective:  CC: left ear fullness  Christie [name] presents today with ear fullness, she reports having a dental procedure where she was in the dental chair for quite a while with her mouth open wide and since then she has had jaw pain.  She reports there is no radiation the pain is constant and she says it 6/10 pain.  Associated symptoms are the jaw pain from the dental procedure.  Pertinent negatives no fever no chills, no shortness of breath, no chest pain, no ear drainage, no new changes in her hearing.  Her dentist gave her prednisone for this jaw pain, and she completed this on Tuesday, which she said has helped with this pain (8/10 before prednisone).  She reports there is no radiating pain.    Medical History Cyara has a past medical history of Cancer ([place]) (1999), Depression, DM (diabetes mellitus) ([place]) (2012), Elevated liver enzymes, History of kidney stones, Hyperlipidemia, Hyperlipidemia associated with type 2 diabetes mellitus ([place]) (02/25/2020), Hypertension (2000), and Spastic colon.   Outpatient Encounter Medications as of 10/01/2020  Medication Sig  . ACCU-CHEK AVIVA PLUS test strip USE TO TEST BLOOD SUGAR ONCE DAILY.  Marland Kitchen acetaminophen (TYLENOL) 500 MG tablet Take 500 mg by mouth every 6 (six) hours as needed.  . ALPRAZolam (XANAX) 0.5 MG tablet TAKE (1) TABLET BY MOUTH AT BEDTIME.  Marland Kitchen amLODipine (NORVASC) 5 MG tablet Take 1 tablet (5 mg total) by mouth daily.  Marland Kitchen aspirin 81 MG tablet Take 81 mg by mouth daily.  . cholecalciferol (VITAMIN D) 1000 UNITS tablet Take  1,000 Units by mouth daily.  . empagliflozin (JARDIANCE) 25 MG TABS tablet Take 1 tablet (25 mg total) by mouth daily.  Marland Kitchen glipiZIDE (GLUCOTROL) 5 MG tablet 2 in the morning and 2-1/2 in the evening at supper  . losartan (COZAAR) 100 MG tablet 1 daily  . Multiple Vitamins-Minerals (MACULAR VITAMIN BENEFIT PO) Take 1 tablet by mouth daily.  . Probiotic Product (HEALTHY COLON PO) Take 1 capsule by mouth every morning. One qam   . rosuvastatin (CRESTOR) 40 MG tablet TAKE ONE TABLET BY MOUTH ONCE DAILY.  Marland Kitchen sertraline (ZOLOFT) 50 MG tablet 1 daily   No facility-administered encounter medications on file as of 10/01/2020.     Review of Systems  Constitutional: Negative for chills and fever.  HENT: Negative for ear discharge, sore throat and tinnitus.   Respiratory: Negative for chest tightness and shortness of breath.   Cardiovascular: Negative for chest pain.  Gastrointestinal: Negative for abdominal pain.  Genitourinary: Negative.   Musculoskeletal:       Left side jaw pain.   Neurological: Negative.   Hematological: Negative for adenopathy.  Psychiatric/Behavioral:       Still misses her deceased husband.     Vitals BP 110/68   Pulse 87   Temp 98.2 F (36.8 C)   Wt 178 lb (80.7 kg)   SpO2 98%   BMI 32.56 kg/m   Objective:   Physical Exam Vitals  and nursing note reviewed.  Constitutional:      Appearance: Normal appearance.  HENT:     Right Ear: Tympanic membrane normal.     Left Ear: Tympanic membrane normal.     Nose: Nose normal.     Mouth/Throat:     Mouth: Mucous membranes are moist.     Pharynx: Oropharynx is clear.     Comments: Difficult to assess due to inability to open jaw wide.  Cardiovascular:     Rate and Rhythm: Normal rate.     Heart sounds: Murmur heard.      Comments: Appointment with cardiologist in Nov for aortic stenosis. Pulmonary:     Effort: Pulmonary effort is normal.     Breath sounds: Normal breath sounds.  Skin:    General: Skin is  warm and dry.  Neurological:     Mental Status: She is alert and oriented to person, place, and time.  Psychiatric:        Mood and Affect: Mood normal.        Behavior: Behavior normal.        Thought Content: Thought content normal.        Judgment: Judgment normal.     Comments: PHQ-9: 1      Assessment and Plan   1. Jaw pain   Christie [name]'s ears look normal today.  I feel strongly that this is related to the jaw pain that occurred from her prolonged dental procedure.  She completed her prednisone given to her by her dentist.  We will avoid systemic NSAIDs, however ,she will try diclofenac topical up to 4 times per day on her left jaw area.  She will also alternate ice and heat for 20 minutes each time every 1-2 hours.  The biggest intervention will be that she will try to rest her jaw by not opening her mouth wide for as long as it takes for it to feel better.  Agrees with plan of care discussed today. Understands warning signs to seek further care: increasing pain, can't open her mouth, ear drainage, any significant changes.  Understands to follow-up if her symptoms do not improve.  Christie Ades, FNP-C

## 2020-10-01 NOTE — Patient Instructions (Signed)
Rest the jaw by not opening mouth wide until it feels better. Try diclofenac topical as needed on area. Alternate ice and heat for 20 minutes each time every 1-2         Temporomandibular Joint Syndrome  Temporomandibular joint syndrome (TMJ syndrome) is a condition that causes pain in the temporomandibular joints. These joints are located near your ears and allow your jaw to open and close. For people with TMJ syndrome, chewing, biting, or other movements of the jaw can be difficult or painful. TMJ syndrome is often mild and goes away within a few weeks. However, sometimes the condition becomes a long-term (chronic) problem. What are the causes? This condition may be caused by:  Grinding your teeth or clenching your jaw. Some people do this when they are under stress.  Arthritis.  Injury to the jaw.  Head or neck injury.  Teeth or dentures that are not aligned well. In some cases, the cause of TMJ syndrome may not be known. What are the signs or symptoms? The most common symptom of this condition is an aching pain on the side of the head in the area of the TMJ. Other symptoms may include:  Pain when moving your jaw, such as when chewing or biting.  Being unable to open your jaw all the way.  Making a clicking sound when you open your mouth.  Headache.  Earache.  Neck or shoulder pain. How is this diagnosed? This condition may be diagnosed based on:  Your symptoms and medical history.  A physical exam. Your health care provider may check the range of motion of your jaw.  Imaging tests, such as X-rays or an MRI. You may also need to see your dentist, who will determine if your teeth and jaw are lined up correctly. How is this treated? TMJ syndrome often goes away on its own. If treatment is needed, the options may include:  Eating soft foods and applying ice or heat.  Medicines to relieve pain or inflammation.  Medicines or massage to relax the muscles.  A  splint, bite plate, or mouthpiece to prevent teeth grinding or jaw clenching.  Relaxation techniques or counseling to help reduce stress.  A therapy for pain in which an electrical current is applied to the nerves through the skin (transcutaneous electrical nerve stimulation).  Acupuncture. This is sometimes helpful to relieve pain.  Jaw surgery. This is rarely needed. Follow these instructions at home:  Eating and drinking  Eat a soft diet if you are having trouble chewing.  Avoid foods that require a lot of chewing. Do not chew gum. General instructions  Take over-the-counter and prescription medicines only as told by your health care provider.  If directed, put ice on the painful area. ? Put ice in a plastic bag. ? Place a towel between your skin and the bag. ? Leave the ice on for 20 minutes, 2-3 times a day.  Apply a warm, wet cloth (warm compress) to the painful area as directed.  Massage your jaw area and do any jaw stretching exercises as told by your health care provider.  If you were given a splint, bite plate, or mouthpiece, wear it as told by your health care provider.  Keep all follow-up visits as told by your health care provider. This is important. Contact a health care provider if:  You are having trouble eating.  You have new or worsening symptoms. Get help right away if:  Your jaw locks open or closed. Summary  Temporomandibular joint syndrome (TMJ syndrome) is a condition that causes pain in the temporomandibular joints. These joints are located near your ears and allow your jaw to open and close.  TMJ syndrome is often mild and goes away within a few weeks. However, sometimes the condition becomes a long-term (chronic) problem.  Symptoms include an aching pain on the side of the head in the area of the TMJ, pain when chewing or biting, and being unable to open your jaw all the way. You may also make a clicking sound when you open your mouth.  TMJ  syndrome often goes away on its own. If treatment is needed, it may include medicines to relieve pain, reduce inflammation, or relax the muscles. A splint, bite plate, or mouthpiece may also be used to prevent teeth grinding or jaw clenching. This information is not intended to replace advice given to you by your health care provider. Make sure you discuss any questions you have with your health care provider. Document Revised: 02/16/2018 Document Reviewed: 01/16/2018 Elsevier Patient Education  [place]. Jaw Range of Motion Exercises Jaw range of motion exercises are exercises that help your jaw move better. Exercises that help you have good posture (postural exercises) also help relieve jaw discomfort. These are often done along with range of motion exercises. These exercises can help prevent or improve:  Difficulty opening your mouth.  Pain in your jaw while it is open or closed.  Temporomandibular joint (TMJ) pain.  Headache caused by jaw tension. Take other actions to prevent or relieve jaw pain, such as:  Avoiding things that cause or increase jaw pain. This may include: ? Chewing gum or eating hard foods. ? Clenching your jaw or teeth, grinding your teeth, or keeping tension in your jaw muscles. ? Opening your mouth wide, such as for a big yawn. ? Leaning on your jaw, such as resting your jaw in your hand while leaning on a desk.  Putting ice on your jaw. ? Put ice in a plastic bag. ? Place a towel between your skin and the bag. ? Leave the ice on for 10-15 minutes, 2-3 times a day. Only do jaw exercises that your health care provider approves of. Only move your jaw as far as it can comfortably go in each direction. Do not move your jaw into positions that cause pain. Range of motion exercises Repeat each of these exercises 8 times, 1-2 times a day, or as told by your health care provider. Exercise A: Forward protrusion 1. Push your jaw forward. Hold this position for  1-2 seconds. 2. Allow your jaw to return to its normal position and rest it there for 1-2 seconds. Exercise B: Controlled opening 1. Stand or sit in front of a mirror. Place your tongue on the roof of your mouth, just behind your top teeth. 2. Keeping your tongue on the roof of your mouth, slowly open and close your mouth. 3. While you open and close your mouth, watch your jaw in the mirror. Try to keep your jaw from moving to one side or the other. Exercise C: Right and left motion 1. Move your jaw right. Hold this position for 1-2 seconds. Allow your jaw to return to its normal position, and rest it there for 1-2 seconds. 2. Move your jaw left. Hold this position for 1-2 seconds. Allow your jaw to return to its normal position, and rest it there for 1-2 seconds. Postural exercises Exercise A: Chin tucks 1. You can do  this exercise sitting, standing, or lying down. 2. Move your head straight back, keeping your head level. You can guide the movement by placing your fingers on your chin to push your jaw back in an even motion. You should be able to feel a double chin form at the end of the motion. 3. Hold this position for 5 seconds. Repeat 10-15 times. Exercise B: Shoulder blade squeeze 1. Sit or stand. 2. Bend your elbows to about 90 degrees, which is the shape of a capital letter "L." Keep your upper arms by your body. 3. Squeeze your shoulder blades down and back, as though you were trying to touch your elbows behind you. Do not shrug your shoulders or move your head. 4. Hold this position for 5 seconds. Repeat 10-15 times. Exercise C: Chest stretch 1. Stand facing a corner. 2. Put both of your hands and your forearms on the wall, with your arms wide apart. 3. Make sure your arms are at a 90-degree angle to your body. This means that you should hold your arms straight out from your body, level with the floor. 4. Step in toward the corner. Do not lean in. 5. Hold this position for 30  seconds. Repeat 3 times. Contact a health care provider if you have:  Jaw pain that is new or gets worse.  Clicking or popping sounds while doing the exercises. Get help right away if:  Your jaw is stuck in one place and you cannot move it.  You cannot open or close your mouth. This information is not intended to replace advice given to you by your health care provider. Make sure you discuss any questions you have with your health care provider. Document Revised: 03/29/2019 Document Reviewed: 11/01/2017 Elsevier Patient Education  [place].

## 2020-10-02 ENCOUNTER — Other Ambulatory Visit: Payer: Self-pay | Admitting: Family Medicine

## 2020-10-02 ENCOUNTER — Telehealth: Payer: Self-pay | Admitting: Family Medicine

## 2020-10-02 ENCOUNTER — Encounter: Payer: Self-pay | Admitting: Family Medicine

## 2020-10-02 DIAGNOSIS — R6884 Jaw pain: Secondary | ICD-10-CM

## 2020-10-02 MED ORDER — DICLOFENAC SODIUM 1 % EX GEL: 2.0000 g | Freq: Four times a day (QID) | CUTANEOUS | 0 refills | Status: DC

## 2020-10-02 NOTE — Telephone Encounter (Signed)
Pt calling in because med is not at pharmacy. Pt was seen yesterday and states that Diclofenac cream was suppose to be sent to [facility]. Did advise pt that prescribing system was down and pt understood. Please advise. Thank you

## 2020-10-06 ENCOUNTER — Telehealth: Payer: Self-pay

## 2020-10-06 ENCOUNTER — Encounter: Payer: Self-pay | Admitting: Family Medicine

## 2020-10-06 NOTE — Telephone Encounter (Signed)
Please advise. Thank you

## 2020-10-06 NOTE — Telephone Encounter (Signed)
Patient was seen 10/14 and ointment wasn't received at pharmacy there system was down can you resend in diclofenac sodium 1% , [employer]

## 2020-10-12 ENCOUNTER — Other Ambulatory Visit (HOSPITAL_COMMUNITY)
Admission: RE | Admit: 2020-10-12 | Discharge: 2020-10-12 | Disposition: A | Discharge status: Home / Self Care | Payer: Medicare PPO | Source: Ambulatory Visit | Attending: Family Medicine | Admitting: Family Medicine

## 2020-10-12 ENCOUNTER — Other Ambulatory Visit: Payer: Self-pay

## 2020-10-12 ENCOUNTER — Encounter: Payer: Self-pay | Admitting: Family Medicine

## 2020-10-12 ENCOUNTER — Ambulatory Visit (HOSPITAL_COMMUNITY)
Admission: RE | Admit: 2020-10-12 | Discharge: 2020-10-12 | Disposition: A | Discharge status: Home / Self Care | Payer: Medicare PPO | Source: Ambulatory Visit | Attending: Family Medicine | Admitting: Family Medicine

## 2020-10-12 ENCOUNTER — Ambulatory Visit: Payer: Medicare PPO | Admitting: Family Medicine

## 2020-10-12 VITALS — BP 136/82 | HR 96 | Temp 97.3°F | Wt 177.4 lb

## 2020-10-12 DIAGNOSIS — K625 Hemorrhage of anus and rectum: Secondary | ICD-10-CM | POA: Insufficient documentation

## 2020-10-12 DIAGNOSIS — Z23 Encounter for immunization: Secondary | ICD-10-CM

## 2020-10-12 DIAGNOSIS — I10 Essential (primary) hypertension: Secondary | ICD-10-CM | POA: Diagnosis not present

## 2020-10-12 DIAGNOSIS — R1030 Lower abdominal pain, unspecified: Secondary | ICD-10-CM | POA: Insufficient documentation

## 2020-10-12 DIAGNOSIS — Z8544 Personal history of malignant neoplasm of other female genital organs: Secondary | ICD-10-CM | POA: Diagnosis not present

## 2020-10-12 DIAGNOSIS — N281 Cyst of kidney, acquired: Secondary | ICD-10-CM | POA: Diagnosis not present

## 2020-10-12 DIAGNOSIS — N200 Calculus of kidney: Secondary | ICD-10-CM | POA: Diagnosis not present

## 2020-10-12 LAB — CBC WITH DIFFERENTIAL/PLATELET
Abs Immature Granulocytes: 0.02 10*3/uL (ref 0.00–0.07)
Basophils Absolute: 0 10*3/uL (ref 0.0–0.1)
Basophils Relative: 1 %
Eosinophils Absolute: 0.1 10*3/uL (ref 0.0–0.5)
Eosinophils Relative: 1 %
HCT: 48.8 % — ABNORMAL HIGH (ref 36.0–46.0)
Hemoglobin: 15.7 g/dL — ABNORMAL HIGH (ref 12.0–15.0)
Immature Granulocytes: 0 %
Lymphocytes Relative: 24 %
Lymphs Abs: 1.5 10*3/uL (ref 0.7–4.0)
MCH: 30.6 pg (ref 26.0–34.0)
MCHC: 32.2 g/dL (ref 30.0–36.0)
MCV: 95.1 fL (ref 80.0–100.0)
Monocytes Absolute: 0.5 10*3/uL (ref 0.1–1.0)
Monocytes Relative: 7 %
Neutro Abs: 4.3 10*3/uL (ref 1.7–7.7)
Neutrophils Relative %: 67 %
Platelets: 133 10*3/uL — ABNORMAL LOW (ref 150–400)
RBC: 5.13 MIL/uL — ABNORMAL HIGH (ref 3.87–5.11)
RDW: 13.2 % (ref 11.5–15.5)
WBC: 6.3 10*3/uL (ref 4.0–10.5)
nRBC: 0 % (ref 0.0–0.2)

## 2020-10-12 LAB — POCT URINALYSIS DIPSTICK (MANUAL)
Leukocytes, UA: NEGATIVE
Nitrite, UA: NEGATIVE
Poct Bilirubin: NEGATIVE
Poct Blood: NEGATIVE
Poct Glucose: 250 mg/dL — AB
Poct Ketones: NEGATIVE
Poct Protein: NEGATIVE mg/dL
Poct Urobilinogen: NORMAL mg/dL
Spec Grav, UA: 1.02 (ref 1.010–1.025)
pH, UA: 5.5 (ref 5.0–8.0)

## 2020-10-12 LAB — POCT I-STAT CREATININE: Creatinine, Ser: 0.5 mg/dL (ref 0.44–1.00)

## 2020-10-12 MED ORDER — IOHEXOL 9 MG/ML PO SOLN
ORAL | Status: AC
  Filled 2020-10-12: qty 1000

## 2020-10-12 MED ORDER — IOHEXOL 300 MG/ML  SOLN
100.0000 mL | Freq: Once | INTRAMUSCULAR | Status: AC | PRN
  Administered 2020-10-12: 100 mL via INTRAVENOUS

## 2020-10-12 NOTE — Progress Notes (Signed)
   Subjective:    Patient ID: Christie [name], female    DOB: [birth year], 74 y.o.   MRN: [id number]  HPI Patient reports lower abdominal pain that comes and goes for a couple of weeks, associated with having to use the bathroom. Patient off-and-on over the past few weeks lower abdominal pain discomfort now is significant increased pain discomfort and tenderness to the touch along with some rectal bleeding that she thought was due to her hemorrhoids but she had a large amount in the stool earlier today denies high fever chills sweats wheezing difficulty breathing Patient also reports rectal bleeding that occurs often with her hemorrhoids but experienced a large amount of blood this am.    Results for orders placed or performed in visit on 10/12/20  POCT Urinalysis Dip Manual  Result Value Ref Range   Spec Grav, UA 1.020 1.010 - 1.025   pH, UA 5.5 5.0 - 8.0   Leukocytes, UA Negative Negative   Nitrite, UA Negative Negative   Poct Protein Negative Negative, trace mg/dL   Poct Glucose =250 (A) Normal mg/dL   Poct Ketones Negative Negative   Poct Urobilinogen Normal Normal mg/dL   Poct Bilirubin Negative Negative   Poct Blood Negative Negative, trace   ASCORBIC ACID, POC +++     Review of Systems  Constitutional: Negative for activity change, appetite change and fatigue.  HENT: Negative for congestion and rhinorrhea.   Respiratory: Negative for cough and shortness of breath.   Cardiovascular: Negative for chest pain and leg swelling.  Gastrointestinal: Positive for abdominal pain and blood in stool. Negative for diarrhea.  Endocrine: Negative for polydipsia and polyphagia.  Skin: Negative for color change.  Neurological: Negative for dizziness and weakness.  Psychiatric/Behavioral: Negative for behavioral problems and confusion.       Objective:   Physical Exam Vitals reviewed.  Constitutional:      General: She is not in acute distress. HENT:     Head: Normocephalic.   Cardiovascular:     Rate and Rhythm: Normal rate and regular rhythm.     Heart sounds: Normal heart sounds. No murmur heard.   Pulmonary:     Effort: Pulmonary effort is normal.     Breath sounds: Normal breath sounds.  Lymphadenopathy:     Cervical: No cervical adenopathy.  Neurological:     Mental Status: She is alert.  Psychiatric:        Behavior: Behavior normal.    Tender lower abdominal pain and discomfort to palpation no guarding or rebound       Assessment & Plan:  With severe lower abdominal pain and rectal bleeding patient needs stat lab work as well as CAT scan await the results of this.  Warning signs discussed in detail.  Await the results of the scans  May well also need gastroenterology consultation

## 2020-10-13 NOTE — Addendum Note (Signed)
Addended by: Vicente Males on: 10/13/2020 11:01 AM   Modules accepted: Orders

## 2020-10-13 NOTE — Progress Notes (Signed)
10/13/20- Urgent referral placed

## 2020-10-14 ENCOUNTER — Other Ambulatory Visit: Payer: Self-pay

## 2020-10-14 ENCOUNTER — Encounter (INDEPENDENT_AMBULATORY_CARE_PROVIDER_SITE_OTHER): Payer: Medicare Other | Admitting: Ophthalmology

## 2020-10-14 DIAGNOSIS — H35033 Hypertensive retinopathy, bilateral: Secondary | ICD-10-CM | POA: Diagnosis not present

## 2020-10-14 DIAGNOSIS — I10 Essential (primary) hypertension: Secondary | ICD-10-CM

## 2020-10-14 DIAGNOSIS — H35341 Macular cyst, hole, or pseudohole, right eye: Secondary | ICD-10-CM

## 2020-10-14 DIAGNOSIS — H43812 Vitreous degeneration, left eye: Secondary | ICD-10-CM | POA: Diagnosis not present

## 2020-10-21 NOTE — Progress Notes (Signed)
CARDIOLOGY CONSULT NOTE       Patient ID: ICESS BERTONI MRN: [id number] DOB/AGE: 28-Jan-1946 74 y.o.  Admit date: (Not on file) Referring Physician: Wolfgang Phoenix Primary Physician: Kathyrn Drown, MD Primary Cardiologist: New Reason for Consultation: Aortic valve disease  Active Problems:   * No active hospital problems. *   HPI:  74 y.o. referred by Dr Wolfgang Phoenix for mixed aortic valve disease. I reviewed her echo from 09/18/20 and mean gradient only 12 mmHg peak 23 mmHg DVI 0.56 and AVA 1.3 cm2. Read as mild AS and mild AR with EF 60-65% Compared to echo done 11/14/16 not much change when mean gradient was 11 and peak 21 mmHg.  She has a history of HTN, DM, and hyperlipidemia on appropriate meds  Of note she had severe abdominal pain 10/12/20 and was referred to GI Abdominal CT was benign as was her CBC and Cr   She is a long standing diabetic and has some coronary calcium on CT but no history of clinical angina. When I saw her in 2017 suggested a myovue study which was never done   Had COVID in December Not that sick had infusion Has not been vaccinated Discussed Has had flu shot   She is widowed last 9 years Still sad about it married 74 years Has daughter and 2 grand children in town that look in on her   ROS All other systems reviewed and negative except as noted above  Past Medical History:  Diagnosis Date  . 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 Depression   . DM (diabetes mellitus) ([place]) 2012   oral medications  . Elevated liver enzymes   . History of kidney stones   . Hyperlipidemia   . Hyperlipidemia associated with type 2 diabetes mellitus ([place]) 02/25/2020  . Hypertension 2000  . Spastic colon     Family History  Problem Relation Age of Onset  . Diabetes Father   . Hypertension Father   . Dementia Father   . Pancreatic cancer Mother     Social History   Socioeconomic History  . Marital status: Widowed    Spouse name: Not on file  . Number of children:  Not on file  . Years of education: Not on file  . Highest education level: Not on file  Occupational History  . Not on file  Tobacco Use  . Smoking status: Never Smoker  . Smokeless tobacco: Never Used  Vaping Use  . Vaping Use: Never used  Substance and Sexual Activity  . Alcohol use: No    Alcohol/week: 0.0 standard drinks  . Drug use: No  . Sexual activity: Not on file  Other Topics Concern  . Not on file  Social History Narrative  . Not on file   Social Determinants of Health   Financial Resource Strain:   . Difficulty of Paying Living Expenses: Not on file  Food Insecurity:   . Worried About Charity fundraiser in the Last Year: Not on file  . Ran Out of Food in the Last Year: Not on file  Transportation Needs:   . Lack of Transportation (Medical): Not on file  . Lack of Transportation (Non-Medical): Not on file  Physical Activity:   . Days of Exercise per Week: Not on file  . Minutes of Exercise per Session: Not on file  Stress:   . Feeling of Stress : Not on file  Social Connections:   . Frequency of Communication with  Friends and Family: Not on file  . Frequency of Social Gatherings with Friends and Family: Not on file  . Attends Religious Services: Not on file  . Active Member of Clubs or Organizations: Not on file  . Attends Archivist Meetings: Not on file  . Marital Status: Not on file  Intimate Partner Violence:   . Fear of Current or Ex-Partner: Not on file  . Emotionally Abused: Not on file  . Physically Abused: Not on file  . Sexually Abused: Not on file    Past Surgical History:  Procedure Laterality Date  . ANTERIOR LAT LUMBAR FUSION Right 07/04/2017   Procedure: Right LUMBAR TWO-THREE LUMBAR THREE-FOUR LUMBAR FOUR-FIVE Anterior lateral lumbar interbody fusion with percutaneous pedicle screws;  Surgeon: Erline Levine, MD;  Location: [place];  Service: Neurosurgery;  Laterality: Right;  Right lateral approach   . ANTERIOR LATERAL LUMBAR  FUSION WITH PERCUTANEOUS SCREW 3 LEVEL N/A 07/04/2017   Procedure: PERCUTANEOUS SCREW THREE LEVEL LUMBAR TWO-THREE LUMBAR THREE-FOUR LUMBAR FOUR-FIVE;  Surgeon: Erline Levine, MD;  Location: [place];  Service: Neurosurgery;  Laterality: N/A;  Prone  . BACK SURGERY  2004 and 2017  . CHOLECYSTECTOMY    . COLONOSCOPY  feb 2005  . COLONOSCOPY N/A 02/13/2014   Procedure: COLONOSCOPY;  Surgeon: Rogene Houston, MD;  [facility];  Service: Endoscopy;  Laterality: N/A;  930-moved to 100 Ann notified pt  . ESOPHAGOGASTRODUODENOSCOPY  09/2005  . EYE SURGERY Right    catatract removed  . EYE SURGERY Right    macular   surgery   . KIDNEY STONE SURGERY     for a staghorn kidney stone  . STAPEDECTOMY Bilateral    Right 1991 and Left 1994  . TOTAL ABDOMINAL HYSTERECTOMY  1982  . vulva cancer surgery  1999      Current Outpatient Medications:  .  ACCU-CHEK AVIVA PLUS test strip, USE TO TEST BLOOD SUGAR ONCE DAILY., Disp: 50 strip, Rfl: 0 .  acetaminophen (TYLENOL) 500 MG tablet, Take 500 mg by mouth every 6 (six) hours as needed., Disp: , Rfl:  .  ALPRAZolam (XANAX) 0.5 MG tablet, TAKE (1) TABLET BY MOUTH AT BEDTIME., Disp: 30 tablet, Rfl: 4 .  amLODipine (NORVASC) 5 MG tablet, Take 1 tablet (5 mg total) by mouth daily., Disp: 30 tablet, Rfl: 5 .  cholecalciferol (VITAMIN D) 1000 UNITS tablet, Take 1,000 Units by mouth daily., Disp: , Rfl:  .  dicyclomine (BENTYL) 10 MG capsule, Take 1 capsule (10 mg total) by mouth every 8 (eight) hours as needed for spasms., Disp: 60 capsule, Rfl: 2 .  empagliflozin (JARDIANCE) 25 MG TABS tablet, Take 1 tablet (25 mg total) by mouth daily., Disp: 30 tablet, Rfl: 5 .  glipiZIDE (GLUCOTROL) 5 MG tablet, 2 in the morning and 2-1/2 in the evening at supper, Disp: 135 tablet, Rfl: 1 .  losartan (COZAAR) 100 MG tablet, 1 daily, Disp: 30 tablet, Rfl: 5 .  Multiple Vitamins-Minerals (MACULAR VITAMIN BENEFIT PO), Take 1 tablet by mouth daily., Disp: , Rfl:  .   Probiotic Product (HEALTHY COLON PO), Take 1 capsule by mouth every morning. One qam , Disp: , Rfl:  .  rosuvastatin (CRESTOR) 40 MG tablet, TAKE ONE TABLET BY MOUTH ONCE DAILY., Disp: 90 tablet, Rfl: 1 .  sertraline (ZOLOFT) 50 MG tablet, 1 daily, Disp: 30 tablet, Rfl: 5    Physical Exam: Blood pressure 134/68, pulse 74, height 5\' 4"  (1.626 m), weight 80.8 kg.  Affect appropriate Elderly female  HEENT: normal Neck supple with no adenopathy JVP normal no bruits no thyromegaly Lungs clear with no wheezing and good diaphragmatic motion Heart:  S1/S2 preserved AS  murmur, no rub, gallop or click PMI normal Abdomen: benighn, BS positve, no tenderness, no AAA no bruit.  No HSM or HJR Distal pulses intact with no bruits No edema Neuro non-focal Skin warm and dry No muscular weakness   Labs:   Lab Results  Component Value Date   WBC 6.3 10/12/2020   HGB 15.7 (H) 10/12/2020   HCT 48.8 (H) 10/12/2020   MCV 95.1 10/12/2020   PLT 133 (L) 10/12/2020   No results for input(s): NA, K, CL, CO2, BUN, CREATININE, CALCIUM, PROT, BILITOT, ALKPHOS, ALT, AST, GLUCOSE in the last 168 hours.  Invalid input(s): LABALBU No results found for: CKTOTAL, CKMB, CKMBINDEX, TROPONINI  Lab Results  Component Value Date   CHOL 162 04/30/2020   CHOL 162 11/05/2019   CHOL 158 03/14/2018   Lab Results  Component Value Date   HDL 67 04/30/2020   HDL 72 11/05/2019   HDL 68 03/14/2018   Lab Results  Component Value Date   LDLCALC 75 04/30/2020   LDLCALC 68 11/05/2019   LDLCALC 66 03/14/2018   Lab Results  Component Value Date   TRIG 113 04/30/2020   TRIG 129 11/05/2019   TRIG 119 03/14/2018   Lab Results  Component Value Date   CHOLHDL 2.4 04/30/2020   CHOLHDL 2.3 11/05/2019   CHOLHDL 2.3 03/14/2018   No results found for: LDLDIRECT    Radiology: CT Abdomen Pelvis W Contrast  Result Date: 10/12/2020 CLINICAL DATA:  Abdominal pain, lower abdominal pain and tenderness with rectal  bleeding, bright red blood in stool for 2 weeks, prior history of hysterectomy and cholecystectomy; history hypertension, type II diabetes mellitus these, kidney stones, vulvar cancer EXAM: CT ABDOMEN AND PELVIS WITH CONTRAST TECHNIQUE: Multidetector CT imaging of the abdomen and pelvis was performed using the standard protocol following bolus administration of intravenous contrast. Sagittal and coronal MPR images reconstructed from axial data set. CONTRAST:  116mL OMNIPAQUE IOHEXOL 300 MG/ML SOLN IV. Dilute oral contrast. COMPARISON:  05/28/2015 FINDINGS: Lower chest: Minimal dependent density at the lung bases. Hepatobiliary: Gallbladder surgically absent. Liver normal appearance. Pancreas: Small lipoma of the pancreatic tail 15 x 9 mm image 31. Pancreas otherwise normal. Spleen: Normal appearance Adrenals/Urinary Tract: Adrenal glands normal appearance. LEFT renal cysts, largest 2.8 x 2.6 cm image 24. Nonobstructing calculi at inferior pole LEFT kidney larger measuring 6 mm diameter. Kidneys, ureters, and bladder otherwise normal appearance Stomach/Bowel: Normal appendix. Stomach and bowel loops normal appearance. Vascular/Lymphatic: Atherosclerotic calcifications aorta and origin of celiac/superior mesenteric arteries. Aorta normal caliber. Vascular structures patent. No adenopathy. Reproductive: Uterus and ovaries surgically absent. Vaginal cuff unremarkable. Other: No free air or free fluid. No hernia or inflammatory process. Musculoskeletal: Prior lumbar fusion L2-L5. No acute osseous findings. IMPRESSION: LEFT renal cysts and nonobstructing LEFT renal calculi. Small lipoma of the pancreatic tail 15 x 9 mm. No acute intra-abdominal or intrapelvic abnormalities. Aortic Atherosclerosis (ICD10-I70.0). Electronically Signed   By: Lavonia Dana M.D.   On: 10/12/2020 15:03    EKG: NSR rate 74 normal ECG    ASSESSMENT AND PLAN:   1. AVD:  Mild AS/AR normal EF not much change since 2017 f/u echo in a year  2.  HTN:  Well controlled.  Continue current medications and low sodium Dash type diet.   3. DM:  Discussed low carb diet.  Target hemoglobin A1c is 6.5 or less.  Continue current medications. 4. HLD:  Continue statin labs with primary 5. GI:  Resolved pain no pathology on CT 6. CAD:  Risk with long standing DM and age f/u lexiscan myovue  F/U in a year with echo for AS/AR if myovue normal   Signed: Jenkins Rouge 10/27/2020, 8:31 AM

## 2020-10-22 ENCOUNTER — Other Ambulatory Visit: Payer: Self-pay

## 2020-10-22 ENCOUNTER — Ambulatory Visit (INDEPENDENT_AMBULATORY_CARE_PROVIDER_SITE_OTHER): Payer: Medicare PPO | Admitting: Gastroenterology

## 2020-10-22 ENCOUNTER — Encounter (INDEPENDENT_AMBULATORY_CARE_PROVIDER_SITE_OTHER): Payer: Self-pay | Admitting: Gastroenterology

## 2020-10-22 DIAGNOSIS — R109 Unspecified abdominal pain: Secondary | ICD-10-CM | POA: Insufficient documentation

## 2020-10-22 DIAGNOSIS — R1030 Lower abdominal pain, unspecified: Secondary | ICD-10-CM

## 2020-10-22 MED ORDER — DICYCLOMINE HCL 10 MG PO CAPS: 10.0000 mg | ORAL_CAPSULE | Freq: Three times a day (TID) | ORAL | 2 refills | Status: DC | PRN

## 2020-10-22 NOTE — Patient Instructions (Signed)
Start Bentyl 1 tablet q12h as needed for abdominal pain

## 2020-10-22 NOTE — Progress Notes (Signed)
Christie [name], M.D. Gastroenterology & Hepatology [facility] [address] [place], [place] [address] Primary Care Physician: Christie Drown, MD [place] [id number]  Referring MD: PCP  I will communicate my assessment and recommendations to the referring MD via EMR. Note: Occasional unusual wording and randomly placed punctuation marks may result from the use of speech recognition technology to transcribe this document"  Chief Complaint: Rectal bleeding and abdominal pain.  History of Present Illness: Christie [name] is a 74 y.o. female with PMH vulvar cancer, depression, diabetes, HTN, HLD, who presents for evaluation of rectal bleeding and abdominal pain.  The patient states that 10 days ago she presented new onset of persistent pressure and discomfort in her lower abdomen diffusely.  She states that the discomfort is described as a pressure that comes on and off without clear trigger.  There is no specific situation that improves her symptoms.  The episodes of discomfort are self-limited.  She stated that she has 1-2 bowel movements per day with normal consistency and the bowel movements are not related to the pain, they do not improve her symptoms.  She states that last Monday she presented one episode of fresh blood in her stool which was relatively large but has not presented any more episodes.  Denies having any diarrhea, nausea, vomiting, fever, chills, melena, hematemesis, abdominal distention,  diarrhea, jaundice, pruritus or weight loss.   Due to the symptoms, the patient was seen by her PCP who ordered CT abdomen pelvis with IV con performed on 10/12/2020.  I personally reviewed the images myself.  Patient was found to have LEFT renal cysts and nonobstructing LEFT renal calculi.Small lipoma of the pancreatic tail 15 x 9 mm. No acute intra-abdominal or intrapelvic abnormalities. Aortic Atherosclerosis  Last  EGD: Never Last Colonoscopy: 2014, had presence of single anal papilla and small hemorrhoids.  FHx: neg for any gastrointestinal/liver disease, mother had pancreatic cancer, sister had kidney cancer, Social: neg smoking, alcohol or illicit drug use Surgical: Hysterectomy and cholecystectomy  Past Medical History: Past Medical History:  Diagnosis Date  . Cancer [facility]) 1999   external vulva lesion  . Depression   . DM (diabetes mellitus) ([place]) 2012   oral medications  . Elevated liver enzymes   . History of kidney stones   . Hyperlipidemia   . Hyperlipidemia associated with type 2 diabetes mellitus ([place]) 02/25/2020  . Hypertension 2000  . Spastic colon     Past Surgical History: Past Surgical History:  Procedure Laterality Date  . ANTERIOR LAT LUMBAR FUSION Right 07/04/2017   Procedure: Right LUMBAR TWO-THREE LUMBAR THREE-FOUR LUMBAR FOUR-FIVE Anterior lateral lumbar interbody fusion with percutaneous pedicle screws;  Surgeon: Erline Levine, MD;  Location: [place];  Service: Neurosurgery;  Laterality: Right;  Right lateral approach   . ANTERIOR LATERAL LUMBAR FUSION WITH PERCUTANEOUS SCREW 3 LEVEL N/A 07/04/2017   Procedure: PERCUTANEOUS SCREW THREE LEVEL LUMBAR TWO-THREE LUMBAR THREE-FOUR LUMBAR FOUR-FIVE;  Surgeon: Erline Levine, MD;  Location: [place];  Service: Neurosurgery;  Laterality: N/A;  Prone  . BACK SURGERY  2004 and 2017  . CHOLECYSTECTOMY    . COLONOSCOPY  feb 2005  . COLONOSCOPY N/A 02/13/2014   Procedure: COLONOSCOPY;  Surgeon: Rogene Houston, MD;  [facility];  Service: Endoscopy;  Laterality: N/A;  930-moved to 100 Ann notified pt  . ESOPHAGOGASTRODUODENOSCOPY  09/2005  . EYE SURGERY Right    catatract removed  . EYE  SURGERY Right    macular   surgery   . KIDNEY STONE SURGERY     for a staghorn kidney stone  . STAPEDECTOMY Bilateral    Right 1991 and Left 1994  . TOTAL ABDOMINAL HYSTERECTOMY  1982  . vulva cancer surgery  1999    Family  History: Family History  Problem Relation Age of Onset  . Diabetes Father   . Hypertension Father   . Dementia Father   . Pancreatic cancer Mother     Social History: Social History   Tobacco Use  Smoking Status Never Smoker  Smokeless Tobacco Never Used   Social History   Substance and Sexual Activity  Alcohol Use No  . Alcohol/week: 0.0 standard drinks   Social History   Substance and Sexual Activity  Drug Use No    Allergies: Allergies  Allergen Reactions  . Lipitor [Atorvastatin] Other (See Comments)    ELEVATED LFT's  . Ace Inhibitors     UNSPECIFIED REACTION   . Metformin And Related     REACTION IS SIDE EFFECT ,  UNSPECIFIED REACTION     Medications: Current Outpatient Medications  Medication Sig Dispense Refill  . ACCU-CHEK AVIVA PLUS test strip USE TO TEST BLOOD SUGAR ONCE DAILY. 50 strip 0  . acetaminophen (TYLENOL) 500 MG tablet Take 500 mg by mouth every 6 (six) hours as needed.    . ALPRAZolam (XANAX) 0.5 MG tablet TAKE (1) TABLET BY MOUTH AT BEDTIME. 30 tablet 4  . amLODipine (NORVASC) 5 MG tablet Take 1 tablet (5 mg total) by mouth daily. 30 tablet 5  . cholecalciferol (VITAMIN D) 1000 UNITS tablet Take 1,000 Units by mouth daily.    . empagliflozin (JARDIANCE) 25 MG TABS tablet Take 1 tablet (25 mg total) by mouth daily. 30 tablet 5  . glipiZIDE (GLUCOTROL) 5 MG tablet 2 in the morning and 2-1/2 in the evening at supper 135 tablet 1  . losartan (COZAAR) 100 MG tablet 1 daily 30 tablet 5  . Multiple Vitamins-Minerals (MACULAR VITAMIN BENEFIT PO) Take 1 tablet by mouth daily.    . Probiotic Product (HEALTHY COLON PO) Take 1 capsule by mouth every morning. One qam     . rosuvastatin (CRESTOR) 40 MG tablet TAKE ONE TABLET BY MOUTH ONCE DAILY. 90 tablet 1  . sertraline (ZOLOFT) 50 MG tablet 1 daily 30 tablet 5  . aspirin 81 MG tablet Take 81 mg by mouth daily. (Patient not taking: Reported on 10/22/2020)    . diclofenac Sodium (VOLTAREN) 1 % GEL Apply  2 g topically 4 (four) times daily. (Patient not taking: Reported on 10/22/2020) 2 g 0   No current facility-administered medications for this visit.    Review of Systems: GENERAL: negative for malaise, night sweats HEENT: No changes in hearing or vision, no nose bleeds or other nasal problems. NECK: Negative for lumps, goiter, pain and significant neck swelling RESPIRATORY: Negative for cough, wheezing CARDIOVASCULAR: Negative for chest pain, leg swelling, palpitations, orthopnea GI: SEE HPI MUSCULOSKELETAL: Negative for joint pain or swelling, back pain, and muscle pain. SKIN: Negative for lesions, rash PSYCH: Negative for sleep disturbance, mood disorder and recent psychosocial stressors. HEMATOLOGY Negative for prolonged bleeding, bruising easily, and swollen nodes. ENDOCRINE: Negative for cold or heat intolerance, polyuria, polydipsia and goiter. NEURO: negative for tremor, gait imbalance, syncope and seizures. The remainder of the review of systems is noncontributory.   Physical Exam: BP (!) 162/83 (BP Location: Right Arm, Patient Position: Sitting, Cuff Size: Large)  Pulse 70   Temp 97.7 F (36.5 C) (Oral)   Ht 5' 3.5" (1.613 m)   Wt 177 lb 1.6 oz (80.3 kg)   BMI 30.88 kg/m  GENERAL: The patient is AO x3, in no acute distress. HEENT: Head is normocephalic and atraumatic. EOMI are intact. Mouth is well hydrated and without lesions. NECK: Supple. No masses LUNGS: Clear to auscultation. No presence of rhonchi/wheezing/rales. Adequate chest expansion HEART: RRR, normal s1 and s2. ABDOMEN: Soft, nontender, no guarding, no peritoneal signs, and nondistended. BS +. No masses. EXTREMITIES: Without any cyanosis, clubbing, rash, lesions or edema. NEUROLOGIC: AOx3, no focal motor deficit. SKIN: no jaundice, no rashes   Imaging/Labs: as above  I personally reviewed and interpreted the available labs, imaging and endoscopic files.  Impression and Plan: CATELYNN SPARGER is a 74  y.o. female with PMH vulvar cancer, depression, diabetes, HTN, HLD, who presents for evaluation of rectal bleeding and abdominal pain.  The patient has presented relatively recent onset of symptoms of abdominal discomfort` but she has not presented any red flag signs.  Notably she had a CT of the abdomen with IV contrast which did not show any acute alterations to explain her symptoms.  I do not consider that performing a colonoscopy at this time will help evaluating her symptoms, but if her symptomatology persist we could proceed doing one she had a normal colonoscopy 10 years ago.  It is likely her symptoms are related to irritable bowel syndrome.  Her rectal bleeding could be attributed to her previous hemorrhoids.  For now, I explained to the patient that symptomatic management with Bentyl will be warranted as needed.  She understood and agreed.  - Start Bentyl 1 tablet q12h as needed for abdominal pain -If abdominal pain persists, we will proceed with a colonoscopy.  All questions were answered.      Christie Peppers, MD Gastroenterology and Hepatology [facility]

## 2020-10-27 ENCOUNTER — Encounter: Payer: Self-pay | Admitting: Cardiovascular Disease

## 2020-10-27 ENCOUNTER — Ambulatory Visit: Payer: Medicare PPO | Admitting: Cardiovascular Disease

## 2020-10-27 ENCOUNTER — Other Ambulatory Visit: Payer: Self-pay

## 2020-10-27 VITALS — BP 134/68 | HR 74 | Ht 64.0 in | Wt 178.2 lb

## 2020-10-27 DIAGNOSIS — R0600 Dyspnea, unspecified: Secondary | ICD-10-CM

## 2020-10-27 DIAGNOSIS — I2510 Atherosclerotic heart disease of native coronary artery without angina pectoris: Secondary | ICD-10-CM

## 2020-10-27 DIAGNOSIS — I350 Nonrheumatic aortic (valve) stenosis: Secondary | ICD-10-CM | POA: Diagnosis not present

## 2020-10-27 DIAGNOSIS — R0609 Other forms of dyspnea: Secondary | ICD-10-CM

## 2020-10-27 NOTE — Patient Instructions (Signed)
Medication Instructions:  Your physician recommends that you continue on your current medications as directed. Please refer to the Current Medication list given to you today.  *If you need a refill on your cardiac medications before your next appointment, please call your pharmacy*  Lab Work: If you have labs (blood work) drawn today and your tests are completely normal, you will receive your results only by: Marland Kitchen MyChart Message (if you have MyChart) OR . A paper copy in the mail If you have any lab test that is abnormal or we need to change your treatment, we will call you to review the results.  Testing/Procedures: Your physician has requested that you have a lexiscan myoview. For further information please visit [email or web address]. Please follow instruction sheet, as given.  Follow-Up: At [facility], you and your health needs are our priority.  As part of our continuing mission to provide you with exceptional heart care, we have created designated Provider Care Teams.  These Care Teams include your primary Cardiologist (physician) and Advanced Practice Providers (APPs -  Physician Assistants and Nurse Practitioners) who all work together to provide you with the care you need, when you need it.  We recommend signing up for the patient portal called "MyChart".  Sign up information is provided on this After Visit Summary.  MyChart is used to connect with patients for Virtual Visits (Telemedicine).  Patients are able to view lab/test results, encounter notes, upcoming appointments, etc.  Non-urgent messages can be sent to your provider as well.   To learn more about what you can do with MyChart, go to [email or web address].    Your next appointment:   1 year(s)  The format for your next appointment:   In Person  Provider:   You may see Dr. Johnsie Cancel or one of the following Advanced Practice Providers on your designated Care Team:    Truitt Merle, NP  Cecilie Kicks, NP  Kathyrn Drown, NP

## 2020-11-10 ENCOUNTER — Telehealth: Payer: Self-pay

## 2020-11-10 NOTE — Telephone Encounter (Signed)
Attempted to contact the patient, but no answer. S.Gianpaolo Mindel EMTP

## 2020-11-16 ENCOUNTER — Telehealth (HOSPITAL_COMMUNITY): Payer: Self-pay

## 2020-11-16 NOTE — Telephone Encounter (Signed)
Detailed instructions left on the patient's answering machine. Asked to call back with any questions. S.Gwendolyn Nishi EMTP

## 2020-11-17 ENCOUNTER — Ambulatory Visit (HOSPITAL_COMMUNITY): Payer: Medicare PPO | Attending: Cardiovascular Disease

## 2020-11-17 ENCOUNTER — Other Ambulatory Visit: Payer: Self-pay

## 2020-11-17 DIAGNOSIS — R0600 Dyspnea, unspecified: Secondary | ICD-10-CM | POA: Insufficient documentation

## 2020-11-17 DIAGNOSIS — I2510 Atherosclerotic heart disease of native coronary artery without angina pectoris: Secondary | ICD-10-CM | POA: Diagnosis not present

## 2020-11-17 DIAGNOSIS — R0609 Other forms of dyspnea: Secondary | ICD-10-CM

## 2020-11-17 DIAGNOSIS — I350 Nonrheumatic aortic (valve) stenosis: Secondary | ICD-10-CM | POA: Insufficient documentation

## 2020-11-17 LAB — MYOCARDIAL PERFUSION IMAGING
LV dias vol: 55 mL (ref 46–106)
LV sys vol: 18 mL
Peak HR: 96 {beats}/min
Rest HR: 70 {beats}/min
SDS: 0
SRS: 0
SSS: 0
TID: 1.11

## 2020-11-17 MED ORDER — REGADENOSON 0.4 MG/5ML IV SOLN
0.4000 mg | Freq: Once | INTRAVENOUS | Status: AC
  Administered 2020-11-17: 0.4 mg via INTRAVENOUS

## 2020-11-17 MED ORDER — TECHNETIUM TC 99M TETROFOSMIN IV KIT
27.2000 | PACK | Freq: Once | INTRAVENOUS | Status: AC | PRN
  Administered 2020-11-17: 27.2 via INTRAVENOUS
  Filled 2020-11-17: qty 28

## 2020-11-17 MED ORDER — TECHNETIUM TC 99M TETROFOSMIN IV KIT
7.5000 | PACK | Freq: Once | INTRAVENOUS | Status: AC | PRN
  Administered 2020-11-17: 7.5 via INTRAVENOUS
  Filled 2020-11-17: qty 8

## 2020-11-18 ENCOUNTER — Telehealth: Payer: Self-pay | Admitting: Cardiovascular Disease

## 2020-11-18 NOTE — Telephone Encounter (Signed)
Follow Up:     Pt is returning Pam's call, concerning her Stress Test results.

## 2020-11-18 NOTE — Telephone Encounter (Signed)
The patient has been notified of the result and verbalized understanding.  All questions (if any) were answered. Michaelyn Barter, RN 11/18/2020 4:56 PM

## 2020-11-30 ENCOUNTER — Other Ambulatory Visit: Payer: Self-pay | Admitting: Family Medicine

## 2020-12-29 ENCOUNTER — Other Ambulatory Visit: Payer: Self-pay | Admitting: Family Medicine

## 2021-01-19 ENCOUNTER — Telehealth (INDEPENDENT_AMBULATORY_CARE_PROVIDER_SITE_OTHER): Payer: Self-pay | Admitting: Gastroenterology

## 2021-01-19 ENCOUNTER — Other Ambulatory Visit: Payer: Self-pay | Admitting: Family Medicine

## 2021-01-19 ENCOUNTER — Other Ambulatory Visit (INDEPENDENT_AMBULATORY_CARE_PROVIDER_SITE_OTHER): Payer: Self-pay | Admitting: Internal Medicine

## 2021-01-19 DIAGNOSIS — Z1231 Encounter for screening mammogram for malignant neoplasm of breast: Secondary | ICD-10-CM | POA: Diagnosis not present

## 2021-01-19 NOTE — Telephone Encounter (Signed)
Patient called stated she is having abdominal pain - stated she saw Dr Jenetta Downer he prescribed bentyl - states the medication is not helping - please advise - ph# [phone]

## 2021-01-19 NOTE — Telephone Encounter (Signed)
Patient aware she can discontinue the dicyclomine if not helping and she was transferred to [facility] to set up an appt with Dr.Castaneda. Per Dr. Laural Golden patient may take Tylenol up to 2 grams per day.

## 2021-01-19 NOTE — Telephone Encounter (Signed)
Can discontinue dicyclomine if it is not helping Will use tramadol on as needed for pain. Patient should keep symptom diary until office visit with Dr. Jenetta Downer in the next 2 weeks

## 2021-01-19 NOTE — Telephone Encounter (Signed)
History of dependence on narcotics Pain is not intractable Will hold off using tramadol Need office visit prior to considering CTA or MR.

## 2021-01-19 NOTE — Telephone Encounter (Signed)
Tried calling patient x two number busy.

## 2021-01-19 NOTE — Telephone Encounter (Signed)
Patient states she has had some lower abdominal pain on going since November 2021. She was given Bentyl and at first it was working, but now seems to not be helping. She is taking the Bentyl BID. No nausea,vomiting, no fever, no diarrhea, has occasional hard stools,but she would not say she is constipated she has seen bright red blood in stools,but contributes it to Hemorrhoids. Please advise.

## 2021-01-20 NOTE — Telephone Encounter (Signed)
Patient aware of all.

## 2021-01-21 LAB — HM MAMMOGRAPHY: HM Mammogram: NORMAL (ref 0–4)

## 2021-01-22 ENCOUNTER — Encounter: Payer: Self-pay | Admitting: Family Medicine

## 2021-01-25 ENCOUNTER — Telehealth: Payer: Self-pay

## 2021-01-25 ENCOUNTER — Other Ambulatory Visit: Payer: Self-pay

## 2021-01-25 DIAGNOSIS — E119 Type 2 diabetes mellitus without complications: Secondary | ICD-10-CM

## 2021-01-25 MED ORDER — GLIPIZIDE 5 MG PO TABS: ORAL_TABLET | ORAL | 0 refills | Status: DC

## 2021-01-25 NOTE — Telephone Encounter (Signed)
Pt needs refill on glipiZIDE (GLUCOTROL) 5 MG tablet sent to [place], [place]

## 2021-02-02 ENCOUNTER — Other Ambulatory Visit: Payer: Self-pay | Admitting: Family Medicine

## 2021-02-10 ENCOUNTER — Telehealth (INDEPENDENT_AMBULATORY_CARE_PROVIDER_SITE_OTHER): Payer: Self-pay

## 2021-02-10 ENCOUNTER — Encounter (INDEPENDENT_AMBULATORY_CARE_PROVIDER_SITE_OTHER): Payer: Self-pay | Admitting: Gastroenterology

## 2021-02-10 ENCOUNTER — Encounter (INDEPENDENT_AMBULATORY_CARE_PROVIDER_SITE_OTHER): Payer: Self-pay

## 2021-02-10 ENCOUNTER — Ambulatory Visit (INDEPENDENT_AMBULATORY_CARE_PROVIDER_SITE_OTHER): Payer: Medicare PPO | Admitting: Gastroenterology

## 2021-02-10 ENCOUNTER — Other Ambulatory Visit (INDEPENDENT_AMBULATORY_CARE_PROVIDER_SITE_OTHER): Payer: Self-pay

## 2021-02-10 ENCOUNTER — Other Ambulatory Visit: Payer: Self-pay

## 2021-02-10 VITALS — BP 145/78 | HR 79 | Temp 98.2°F | Ht 64.0 in | Wt 176.0 lb

## 2021-02-10 DIAGNOSIS — R1030 Lower abdominal pain, unspecified: Secondary | ICD-10-CM

## 2021-02-10 MED ORDER — SUPREP BOWEL PREP KIT 17.5-3.13-1.6 GM/177ML PO SOLN: 1.0000 | ORAL | 0 refills | Status: DC

## 2021-02-10 NOTE — Patient Instructions (Signed)
Explained presumed etiology of IBS symptoms. Patient was counseled about the benefit of implementing a low FODMAP to improve symptoms and recurrent episodes. A dietary list was provided to the patient. Also, the patient was counseled about the benefit of avoiding stressing situations and potential environmental triggers leading to symptomatology. Schedule colonoscopy

## 2021-02-10 NOTE — Telephone Encounter (Signed)
Christie [name], CMA  

## 2021-02-10 NOTE — Progress Notes (Signed)
[name], M.D. Gastroenterology & Hepatology [facility] [address] [place], [place] [address]  Primary Care Physician: Kathyrn Drown, MD [place] [address]  I will communicate my assessment and recommendations to the referring MD via EMR.  Problems: 1. Lower abdominal pain 2. Intermittent diarrhea  History of Present Illness: Christie [name] is a 75 y.o. female with PMH vulvar cancer, depression, diabetes, HTN, HLD, who presents for follow-up of abdominal pain and intermittent diarrhea.  The patient was last seen on 10/22/2020. At that time, the patient was prescribed Bentyl as needed to improve her symptoms.  The patient called during early February as she had presented persistent abdominal pain despite taking the Bentyl compliantly. Patient was advised to follow-up in the clinic to remind if she needed further imaging or possibly endoscopic evaluation.  Patient reports that on a daily basis she is presenting a sensation of "heavinesss" in the lower abdominal area, which fluctuates during the day.  Patient states that he ate has not improved after she has taken the Bentyl in the past and is there into persistent discomfort of her abdomen.  She denies having any nighttime symptoms. Patient reports that she has noticed intemittent episodes of watery bowel movements x1 in the AM but then it normalizes in consistency. States that the pain has no clear triggers or alleviating factor.The patient denies having any nausea, vomiting, fever, chills, hematochezia, melena, hematemesis, abdominal distention, jaundice, pruritus or weight loss.  She is not following any type of diet.  Past Medical History: Past Medical History:  Diagnosis Date  . Cancer [facility]) 1999   external vulva lesion  . Depression   . DM (diabetes mellitus) ([place]) 2012   oral medications  . Elevated liver enzymes   . History of kidney  stones   . Hyperlipidemia   . Hyperlipidemia associated with type 2 diabetes mellitus ([place]) 02/25/2020  . Hypertension 2000  . Spastic colon     Past Surgical History: Past Surgical History:  Procedure Laterality Date  . ANTERIOR LAT LUMBAR FUSION Right 07/04/2017   Procedure: Right LUMBAR TWO-THREE LUMBAR THREE-FOUR LUMBAR FOUR-FIVE Anterior lateral lumbar interbody fusion with percutaneous pedicle screws;  Surgeon: Erline Levine, MD;  Location: [place];  Service: Neurosurgery;  Laterality: Right;  Right lateral approach   . ANTERIOR LATERAL LUMBAR FUSION WITH PERCUTANEOUS SCREW 3 LEVEL N/A 07/04/2017   Procedure: PERCUTANEOUS SCREW THREE LEVEL LUMBAR TWO-THREE LUMBAR THREE-FOUR LUMBAR FOUR-FIVE;  Surgeon: Erline Levine, MD;  Location: [place];  Service: Neurosurgery;  Laterality: N/A;  Prone  . BACK SURGERY  2004 and 2017  . CHOLECYSTECTOMY    . COLONOSCOPY  feb 2005  . COLONOSCOPY N/A 02/13/2014   Procedure: COLONOSCOPY;  Surgeon: Rogene Houston, MD;  [facility];  Service: Endoscopy;  Laterality: N/A;  930-moved to 100 Ann notified pt  . ESOPHAGOGASTRODUODENOSCOPY  09/2005  . EYE SURGERY Right    catatract removed  . EYE SURGERY Right    macular   surgery   . KIDNEY STONE SURGERY     for a staghorn kidney stone  . STAPEDECTOMY Bilateral    Right 1991 and Left 1994  . TOTAL ABDOMINAL HYSTERECTOMY  1982  . vulva cancer surgery  1999    Family History: Family History  Problem Relation Age of Onset  . Diabetes Father   . Hypertension Father   . Dementia Father   . Pancreatic cancer Mother  Social History: Social History   Tobacco Use  Smoking Status Never Smoker  Smokeless Tobacco Never Used   Social History   Substance and Sexual Activity  Alcohol Use No  . Alcohol/week: 0.0 standard drinks   Social History   Substance and Sexual Activity  Drug Use No    Allergies: Allergies  Allergen Reactions  . Atorvastatin Other (See Comments)    ELEVATED  LFT's LIVER ENZYMES GO UP  . Metformin And Related Other (See Comments)    REACTION IS SIDE EFFECT ,  UNSPECIFIED REACTION  DOESN'T KNOW WHY THIS IS ON THE LIST  . Oxycodone-Acetaminophen Other (See Comments)  . Ace Inhibitors     UNSPECIFIED REACTION     Medications: Current Outpatient Medications  Medication Sig Dispense Refill  . ACCU-CHEK AVIVA PLUS test strip USE TO TEST BLOOD SUGAR ONCE DAILY. 50 strip 0  . acetaminophen (TYLENOL) 500 MG tablet Take 500 mg by mouth every 6 (six) hours as needed.    . ALPRAZolam (XANAX) 0.5 MG tablet TAKE (1) TABLET BY MOUTH AT BEDTIME. 30 tablet 2  . amLODipine (NORVASC) 5 MG tablet Take 1 tablet (5 mg total) by mouth daily. 30 tablet 5  . cholecalciferol (VITAMIN D) 1000 UNITS tablet Take 1,000 Units by mouth daily.    Marland Kitchen dicyclomine (BENTYL) 10 MG capsule Take 1 capsule (10 mg total) by mouth every 8 (eight) hours as needed for spasms. 60 capsule 2  . glipiZIDE (GLUCOTROL) 5 MG tablet TAKE 2 TABLETS IN THE MORNING AND 2 1/2 IN THE EVENING AT SUPPER 135 tablet 0  . JARDIANCE 25 MG TABS tablet TAKE ONE TABLET BY MOUTH ONCE DAILY. 90 tablet 0  . losartan (COZAAR) 100 MG tablet 1 daily 30 tablet 5  . Multiple Vitamins-Minerals (MACULAR VITAMIN BENEFIT PO) Take 1 tablet by mouth daily.    . Probiotic Product (HEALTHY COLON PO) Take 1 capsule by mouth every morning. One qam    . rosuvastatin (CRESTOR) 40 MG tablet TAKE ONE TABLET BY MOUTH ONCE DAILY. 90 tablet 0  . sertraline (ZOLOFT) 50 MG tablet 1 daily 30 tablet 5  . zinc gluconate 50 MG tablet Take 50 mg by mouth daily.    . Na Sulfate-K Sulfate-Mg Sulf (SUPREP BOWEL PREP KIT) 17.5-3.13-1.6 GM/177ML SOLN Take 1 kit by mouth as directed. 354 mL 0   No current facility-administered medications for this visit.    Review of Systems: GENERAL: negative for malaise, night sweats HEENT: No changes in hearing or vision, no nose bleeds or other nasal problems. NECK: Negative for lumps, goiter, pain and  significant neck swelling RESPIRATORY: Negative for cough, wheezing CARDIOVASCULAR: Negative for chest pain, leg swelling, palpitations, orthopnea GI: SEE HPI MUSCULOSKELETAL: Negative for joint pain or swelling, back pain, and muscle pain. SKIN: Negative for lesions, rash PSYCH: Negative for sleep disturbance, mood disorder and recent psychosocial stressors. HEMATOLOGY Negative for prolonged bleeding, bruising easily, and swollen nodes. ENDOCRINE: Negative for cold or heat intolerance, polyuria, polydipsia and goiter. NEURO: negative for tremor, gait imbalance, syncope and seizures. The remainder of the review of systems is noncontributory.   Physical Exam: BP (!) 145/78 (BP Location: Left Arm, Patient Position: Sitting, Cuff Size: Large)   Pulse 79   Temp 98.2 F (36.8 C) (Oral)   Ht _0  (1.626 m)   Wt 176 lb (79.8 kg)   BMI 30.21 kg/m  GENERAL: The patient is AO x3, in no acute distress. HEENT: Head is normocephalic and atraumatic. EOMI are intact.  Mouth is well hydrated and without lesions. NECK: Supple. No masses LUNGS: Clear to auscultation. No presence of rhonchi/wheezing/rales. Adequate chest expansion HEART: RRR, normal s1 and s2. ABDOMEN: mildly tender to palpation in the left lower quadrant, no guarding, no peritoneal signs, and nondistended. BS +. No masses. EXTREMITIES: Without any cyanosis, clubbing, rash, lesions or edema. NEUROLOGIC: AOx3, no focal motor deficit. SKIN: no jaundice, no rashes  Imaging/Labs: as above  I personally reviewed and interpreted the available labs, imaging and endoscopic files.  Impression and Plan: Christie [name] is a 75 y.o. female with PMH vulvar cancer, depression, diabetes, HTN, HLD, who presents for follow-up of abdominal pain and intermittent diarrhea.  The patient has presented persistent symptoms despite taking antispasmodics.  She has not presented any other complaints or any red flag signs.  She had previous cross-sectional  abdominal imaging with IV contrast that was unremarkable for a clear cause of her symptoms.  I do consider most of her symptomatology is likely related to functional etiology, but given her age and new onset of symptoms, will need to explore this further with a colonoscopy with random colonic biopsies to rule out microscopic colitis.  Other potential etiologies of her presentation include SCAD versus diverticular hypersensitivity.  Patient was counseled to implement a low FODMAP diet to improve her symptoms for now, will schedule colonoscopy.  - Explained presumed etiology of IBS symptoms. Patient was counseled about the benefit of implementing a low FODMAP to improve symptoms and recurrent episodes. A dietary list was provided to the patient. Also, the patient was counseled about the benefit of avoiding stressing situations and potential environmental triggers leading to symptomatology. -Schedule colonoscopy -RTC 3 months  All questions were answered.      Harvel Quale, MD Gastroenterology and Hepatology [facility]

## 2021-02-10 NOTE — H&P (View-Only) (Signed)
. Christie [name], M.D. Gastroenterology & Hepatology [facility] [address] [place], [place] [address]  Primary Care Physician: [name], Scott A, MD [address] [place] [address]  I will communicate my assessment and recommendations to the referring MD via EMR.  Problems: 1. Lower abdominal pain 2. Intermittent diarrhea  History of Present Illness: Christie [name] is a 74 y.o. female with PMH vulvar cancer, depression, diabetes, HTN, HLD, who presents for follow-up of abdominal pain and intermittent diarrhea.  The patient was last seen on 10/22/2020. At that time, the patient was prescribed Bentyl as needed to improve her symptoms.  The patient called during early February as she had presented persistent abdominal pain despite taking the Bentyl compliantly. Patient was advised to follow-up in the clinic to remind if she needed further imaging or possibly endoscopic evaluation.  Patient reports that on a daily basis she is presenting a sensation of "heavinesss" in the lower abdominal area, which fluctuates during the day.  Patient states that he ate has not improved after she has taken the Bentyl in the past and is there into persistent discomfort of her abdomen.  She denies having any nighttime symptoms. Patient reports that she has noticed intemittent episodes of watery bowel movements x1 in the AM but then it normalizes in consistency. States that the pain has no clear triggers or alleviating factor.The patient denies having any nausea, vomiting, fever, chills, hematochezia, melena, hematemesis, abdominal distention, jaundice, pruritus or weight loss.  She is not following any type of diet.  Past Medical History: Past Medical History:  Diagnosis Date  . Cancer (HCC) 1999   external vulva lesion  . Depression   . DM (diabetes mellitus) (HCC) 2012   oral medications  . Elevated liver enzymes   . History of kidney  stones   . Hyperlipidemia   . Hyperlipidemia associated with type 2 diabetes mellitus (HCC) 02/25/2020  . Hypertension 2000  . Spastic colon     Past Surgical History: Past Surgical History:  Procedure Laterality Date  . ANTERIOR LAT LUMBAR FUSION Right 07/04/2017   Procedure: Right LUMBAR TWO-THREE LUMBAR THREE-FOUR LUMBAR FOUR-FIVE Anterior lateral lumbar interbody fusion with percutaneous pedicle screws;  Surgeon: Stern, Joseph, MD;  Location: [facility] OR;  Service: Neurosurgery;  Laterality: Right;  Right lateral approach   . ANTERIOR LATERAL LUMBAR FUSION WITH PERCUTANEOUS SCREW 3 LEVEL N/A 07/04/2017   Procedure: PERCUTANEOUS SCREW THREE LEVEL LUMBAR TWO-THREE LUMBAR THREE-FOUR LUMBAR FOUR-FIVE;  Surgeon: Stern, Joseph, MD;  Location: [facility] OR;  Service: Neurosurgery;  Laterality: N/A;  Prone  . BACK SURGERY  2004 and 2017  . CHOLECYSTECTOMY    . COLONOSCOPY  feb 2005  . COLONOSCOPY N/A 02/13/2014   Procedure: COLONOSCOPY;  Surgeon: Najeeb U Rehman, MD;  Location: [facility] ENDO SUITE;  Service: Endoscopy;  Laterality: N/A;  930-moved to 100 Ann notified pt  . ESOPHAGOGASTRODUODENOSCOPY  09/2005  . EYE SURGERY Right    catatract removed  . EYE SURGERY Right    macular   surgery   . KIDNEY STONE SURGERY     for a staghorn kidney stone  . STAPEDECTOMY Bilateral    Right 1991 and Left 1994  . TOTAL ABDOMINAL HYSTERECTOMY  1982  . vulva cancer surgery  1999    Family History: Family History  Problem Relation Age of Onset  . Diabetes Father   . Hypertension Father   . Dementia Father   . Pancreatic cancer Mother       Social History: Social History   Tobacco Use  Smoking Status Never Smoker  Smokeless Tobacco Never Used   Social History   Substance and Sexual Activity  Alcohol Use No  . Alcohol/week: 0.0 standard drinks   Social History   Substance and Sexual Activity  Drug Use No    Allergies: Allergies  Allergen Reactions  . Atorvastatin Other (See Comments)    ELEVATED  LFT's LIVER ENZYMES GO UP  . Metformin And Related Other (See Comments)    REACTION IS SIDE EFFECT ,  UNSPECIFIED REACTION  DOESN'T KNOW WHY THIS IS ON THE LIST  . Oxycodone-Acetaminophen Other (See Comments)  . Ace Inhibitors     UNSPECIFIED REACTION     Medications: Current Outpatient Medications  Medication Sig Dispense Refill  . ACCU-CHEK AVIVA PLUS test strip USE TO TEST BLOOD SUGAR ONCE DAILY. 50 strip 0  . acetaminophen (TYLENOL) 500 MG tablet Take 500 mg by mouth every 6 (six) hours as needed.    . ALPRAZolam (XANAX) 0.5 MG tablet TAKE (1) TABLET BY MOUTH AT BEDTIME. 30 tablet 2  . amLODipine (NORVASC) 5 MG tablet Take 1 tablet (5 mg total) by mouth daily. 30 tablet 5  . cholecalciferol (VITAMIN D) 1000 UNITS tablet Take 1,000 Units by mouth daily.    . dicyclomine (BENTYL) 10 MG capsule Take 1 capsule (10 mg total) by mouth every 8 (eight) hours as needed for spasms. 60 capsule 2  . glipiZIDE (GLUCOTROL) 5 MG tablet TAKE 2 TABLETS IN THE MORNING AND 2 1/2 IN THE EVENING AT SUPPER 135 tablet 0  . JARDIANCE 25 MG TABS tablet TAKE ONE TABLET BY MOUTH ONCE DAILY. 90 tablet 0  . losartan (COZAAR) 100 MG tablet 1 daily 30 tablet 5  . Multiple Vitamins-Minerals (MACULAR VITAMIN BENEFIT PO) Take 1 tablet by mouth daily.    . Probiotic Product (HEALTHY COLON PO) Take 1 capsule by mouth every morning. One qam    . rosuvastatin (CRESTOR) 40 MG tablet TAKE ONE TABLET BY MOUTH ONCE DAILY. 90 tablet 0  . sertraline (ZOLOFT) 50 MG tablet 1 daily 30 tablet 5  . zinc gluconate 50 MG tablet Take 50 mg by mouth daily.    . Na Sulfate-K Sulfate-Mg Sulf (SUPREP BOWEL PREP KIT) 17.5-3.13-1.6 GM/177ML SOLN Take 1 kit by mouth as directed. 354 mL 0   No current facility-administered medications for this visit.    Review of Systems: GENERAL: negative for malaise, night sweats HEENT: No changes in hearing or vision, no nose bleeds or other nasal problems. NECK: Negative for lumps, goiter, pain and  significant neck swelling RESPIRATORY: Negative for cough, wheezing CARDIOVASCULAR: Negative for chest pain, leg swelling, palpitations, orthopnea GI: SEE HPI MUSCULOSKELETAL: Negative for joint pain or swelling, back pain, and muscle pain. SKIN: Negative for lesions, rash PSYCH: Negative for sleep disturbance, mood disorder and recent psychosocial stressors. HEMATOLOGY Negative for prolonged bleeding, bruising easily, and swollen nodes. ENDOCRINE: Negative for cold or heat intolerance, polyuria, polydipsia and goiter. NEURO: negative for tremor, gait imbalance, syncope and seizures. The remainder of the review of systems is noncontributory.   Physical Exam: BP (!) 145/78 (BP Location: Left Arm, Patient Position: Sitting, Cuff Size: Large)   Pulse 79   Temp 98.2 F (36.8 C) (Oral)   Ht 5' 4" (1.626 m)   Wt 176 lb (79.8 kg)   BMI 30.21 kg/m  GENERAL: The patient is AO x3, in no acute distress. HEENT: Head is normocephalic and atraumatic. EOMI are intact.   Mouth is well hydrated and without lesions. NECK: Supple. No masses LUNGS: Clear to auscultation. No presence of rhonchi/wheezing/rales. Adequate chest expansion HEART: RRR, normal s1 and s2. ABDOMEN: mildly tender to palpation in the left lower quadrant, no guarding, no peritoneal signs, and nondistended. BS +. No masses. EXTREMITIES: Without any cyanosis, clubbing, rash, lesions or edema. NEUROLOGIC: AOx3, no focal motor deficit. SKIN: no jaundice, no rashes  Imaging/Labs: as above  I personally reviewed and interpreted the available labs, imaging and endoscopic files.  Impression and Plan: Christie [name] is a 74 y.o. female with PMH vulvar cancer, depression, diabetes, HTN, HLD, who presents for follow-up of abdominal pain and intermittent diarrhea.  The patient has presented persistent symptoms despite taking antispasmodics.  She has not presented any other complaints or any red flag signs.  She had previous cross-sectional  abdominal imaging with IV contrast that was unremarkable for a clear cause of her symptoms.  I do consider most of her symptomatology is likely related to functional etiology, but given her age and new onset of symptoms, will need to explore this further with a colonoscopy with random colonic biopsies to rule out microscopic colitis.  Other potential etiologies of her presentation include SCAD versus diverticular hypersensitivity.  Patient was counseled to implement a low FODMAP diet to improve her symptoms for now, will schedule colonoscopy.  - Explained presumed etiology of IBS symptoms. Patient was counseled about the benefit of implementing a low FODMAP to improve symptoms and recurrent episodes. A dietary list was provided to the patient. Also, the patient was counseled about the benefit of avoiding stressing situations and potential environmental triggers leading to symptomatology. -Schedule colonoscopy -RTC 3 months  All questions were answered.      Stewart Sasaki [name] Mayorga, MD Gastroenterology and Hepatology [facility]  

## 2021-02-16 ENCOUNTER — Other Ambulatory Visit: Payer: Self-pay | Admitting: Family Medicine

## 2021-02-18 ENCOUNTER — Encounter (HOSPITAL_COMMUNITY): Payer: Self-pay | Admitting: Gastroenterology

## 2021-02-22 ENCOUNTER — Other Ambulatory Visit (HOSPITAL_COMMUNITY)
Admission: RE | Admit: 2021-02-22 | Discharge: 2021-02-22 | Disposition: A | Discharge status: Home / Self Care | Payer: Medicare PPO | Source: Ambulatory Visit | Attending: Gastroenterology | Admitting: Gastroenterology

## 2021-02-22 ENCOUNTER — Other Ambulatory Visit: Payer: Self-pay

## 2021-02-22 DIAGNOSIS — Z20822 Contact with and (suspected) exposure to covid-19: Secondary | ICD-10-CM | POA: Diagnosis not present

## 2021-02-22 DIAGNOSIS — Z01812 Encounter for preprocedural laboratory examination: Secondary | ICD-10-CM | POA: Diagnosis not present

## 2021-02-22 LAB — SARS CORONAVIRUS 2 (TAT 6-24 HRS): SARS Coronavirus 2: NEGATIVE

## 2021-02-23 ENCOUNTER — Other Ambulatory Visit: Payer: Self-pay

## 2021-02-23 ENCOUNTER — Ambulatory Visit (HOSPITAL_COMMUNITY): Payer: Medicare PPO | Admitting: Anesthesiology

## 2021-02-23 ENCOUNTER — Ambulatory Visit (HOSPITAL_COMMUNITY)
Admission: RE | Admit: 2021-02-23 | Discharge: 2021-02-23 | Disposition: A | Discharge status: Home / Self Care | Payer: Medicare PPO | Attending: Gastroenterology | Admitting: Gastroenterology

## 2021-02-23 ENCOUNTER — Encounter (HOSPITAL_COMMUNITY): Payer: Self-pay | Admitting: Gastroenterology

## 2021-02-23 ENCOUNTER — Encounter (HOSPITAL_COMMUNITY)
Admission: RE | Disposition: A | Discharge status: Home / Self Care | Payer: Self-pay | Source: Home / Self Care | Attending: Gastroenterology

## 2021-02-23 DIAGNOSIS — R1030 Lower abdominal pain, unspecified: Secondary | ICD-10-CM

## 2021-02-23 DIAGNOSIS — Z8544 Personal history of malignant neoplasm of other female genital organs: Secondary | ICD-10-CM | POA: Diagnosis not present

## 2021-02-23 DIAGNOSIS — Z885 Allergy status to narcotic agent status: Secondary | ICD-10-CM | POA: Diagnosis not present

## 2021-02-23 DIAGNOSIS — K648 Other hemorrhoids: Secondary | ICD-10-CM | POA: Diagnosis not present

## 2021-02-23 DIAGNOSIS — Z888 Allergy status to other drugs, medicaments and biological substances status: Secondary | ICD-10-CM | POA: Insufficient documentation

## 2021-02-23 DIAGNOSIS — Z7984 Long term (current) use of oral hypoglycemic drugs: Secondary | ICD-10-CM | POA: Diagnosis not present

## 2021-02-23 DIAGNOSIS — D122 Benign neoplasm of ascending colon: Secondary | ICD-10-CM | POA: Insufficient documentation

## 2021-02-23 DIAGNOSIS — E1169 Type 2 diabetes mellitus with other specified complication: Secondary | ICD-10-CM | POA: Insufficient documentation

## 2021-02-23 DIAGNOSIS — Z79899 Other long term (current) drug therapy: Secondary | ICD-10-CM | POA: Insufficient documentation

## 2021-02-23 DIAGNOSIS — E785 Hyperlipidemia, unspecified: Secondary | ICD-10-CM | POA: Insufficient documentation

## 2021-02-23 DIAGNOSIS — R197 Diarrhea, unspecified: Secondary | ICD-10-CM | POA: Diagnosis not present

## 2021-02-23 DIAGNOSIS — Z8249 Family history of ischemic heart disease and other diseases of the circulatory system: Secondary | ICD-10-CM | POA: Diagnosis not present

## 2021-02-23 DIAGNOSIS — Z9049 Acquired absence of other specified parts of digestive tract: Secondary | ICD-10-CM | POA: Diagnosis not present

## 2021-02-23 DIAGNOSIS — K635 Polyp of colon: Secondary | ICD-10-CM | POA: Diagnosis not present

## 2021-02-23 DIAGNOSIS — I10 Essential (primary) hypertension: Secondary | ICD-10-CM | POA: Diagnosis not present

## 2021-02-23 DIAGNOSIS — Z800 Family history of malignant neoplasm of digestive organs: Secondary | ICD-10-CM | POA: Insufficient documentation

## 2021-02-23 DIAGNOSIS — Z833 Family history of diabetes mellitus: Secondary | ICD-10-CM | POA: Insufficient documentation

## 2021-02-23 DIAGNOSIS — I350 Nonrheumatic aortic (valve) stenosis: Secondary | ICD-10-CM | POA: Diagnosis not present

## 2021-02-23 HISTORY — PX: POLYPECTOMY: SHX5525

## 2021-02-23 HISTORY — PX: COLONOSCOPY WITH PROPOFOL: SHX5780

## 2021-02-23 HISTORY — PX: BIOPSY: SHX5522

## 2021-02-23 LAB — GLUCOSE, CAPILLARY: Glucose-Capillary: 151 mg/dL — ABNORMAL HIGH (ref 70–99)

## 2021-02-23 SURGERY — COLONOSCOPY WITH PROPOFOL
Anesthesia: General

## 2021-02-23 MED ORDER — LACTATED RINGERS IV SOLN: INTRAVENOUS | Status: DC

## 2021-02-23 MED ORDER — PROPOFOL 10 MG/ML IV BOLUS
INTRAVENOUS | Status: DC | PRN
  Administered 2021-02-23: 50 mg via INTRAVENOUS

## 2021-02-23 MED ORDER — PROPOFOL 500 MG/50ML IV EMUL
INTRAVENOUS | Status: DC | PRN
  Administered 2021-02-23: 150 ug/kg/min via INTRAVENOUS

## 2021-02-23 NOTE — Op Note (Addendum)
[facility] Patient Name: Christie [name] Procedure Date: 02/23/2021 10:27 AM MRN: [id number] Date of Birth: 21-Aug-1946 Attending MD: [name] ,  CSN: [id number] Age: 75 Admit Type: Outpatient Procedure:                Colonoscopy Indications:              Lower abdominal pain, Clinically significant                            diarrhea of unexplained origin Providers:                [name], Janeece Riggers, RN, Raphael Gibney,                            Technician Referring MD:              Medicines:                Monitored Anesthesia Care Complications:            No immediate complications. Estimated Blood Loss:     Estimated blood loss: none. Procedure:                Pre-Anesthesia Assessment:                           - Prior to the procedure, a History and Physical                            was performed, and patient medications, allergies                            and sensitivities were reviewed. The patient's                            tolerance of previous anesthesia was reviewed.                           - The risks and benefits of the procedure and the                            sedation options and risks were discussed with the                            patient. All questions were answered and informed                            consent was obtained.                           - ASA Grade Assessment: II - A patient with mild                            systemic disease.                           After obtaining informed consent, the colonoscope  was passed under direct vision. Throughout the                            procedure, the patient's blood pressure, pulse, and                            oxygen saturations were monitored continuously. The                            [id number]-[id number] ([id number]) scope was introduced through                            the anus and advanced to the the cecum, identified                            by  appendiceal orifice and ileocecal valve. The                            colonoscopy was performed without difficulty. The                            patient tolerated the procedure well. The quality                            of the bowel preparation was adequate. Scope                            withdrawal time was 15 minutes. Scope In: 10:34:08 AM Scope Out: 10:54:15 AM Scope Withdrawal Time: 0 hours 14 minutes 4 seconds  Total Procedure Duration: 0 hours 20 minutes 7 seconds  Findings:      The perianal and digital rectal examinations were normal.      A 5 mm polyp was found in the proximal ascending colon. The polyp was       sessile. The polyp was removed with a cold snare. Resection and       retrieval were complete.      The rest of the colon appeared normal. Biopsies for histology were taken       with a cold forceps from the right colon and left colon for evaluation       of microscopic colitis.      Non-bleeding internal hemorrhoids were found during retroflexion. The       hemorrhoids were small. Impression:               - One 5 mm polyp in the proximal ascending colon,                            removed with a cold snare. Resected and retrieved.                           - The rest of the examined colon is normal.                            Biopsied.                           -  Non-bleeding internal hemorrhoids. Moderate Sedation:      Per Anesthesia Care Recommendation:           - Discharge patient to home (ambulatory).                           - Resume previous diet.                           - Await pathology results.                           - Repeat colonoscopy for surveillance based on                            pathology results.                           - Can take IBGard/pepperming oil every 8 hours as                            needed for abdominal pain Procedure Code(s):        --- Professional ---                           [id number], Colonoscopy, flexible;  with removal of                            tumor(s), polyp(s), or other lesion(s) by snare                            technique                           45380, 59, Colonoscopy, flexible; with biopsy,                            single or multiple Diagnosis Code(s):        --- Professional ---                           K63.5, Polyp of colon                           K64.8, Other hemorrhoids                           R10.30, Lower abdominal pain, unspecified                           R19.7, Diarrhea, unspecified CPT copyright 2019 [employer] Medical [employer]. All rights reserved. The codes documented in this report are preliminary and upon coder review may  be revised to meet current compliance requirements. [name], MD [name],  02/23/2021 10:59:49 AM This report has been signed electronically. Number of Addenda: 0

## 2021-02-23 NOTE — Interval H&P Note (Signed)
History and Physical Interval Note:  02/23/2021 10:21 AM Christie [name] is a 75 y.o. female with PMH vulvar cancer, depression, diabetes, HTN, HLD, who presents for evaluation of abdominal pain and intermittent diarrhea.  The patient states that after she implemented her low FODMAP diet her diarrhea has much improved and she is now having regular bowel movements.  However, she still having some lower abdominal pain which is quite as a pressure.  Denies any nausea, vomiting, fever, chills.  She did not have any improvement with Bentyl.  Last colonoscopy was performed on 2015, she had a normal colon and excellent prep.  No family history of colorectal cancer. . BP (!) 167/84   Pulse 86   Temp 97.9 F (36.6 C) (Oral)   Resp (!) 21   Ht 5' 3.5" (1.613 m)   Wt 78.9 kg   SpO2 100%   BMI 30.34 kg/m  GENERAL: The patient is AO x3, in no acute distress. HEENT: Head is normocephalic and atraumatic. EOMI are intact. Mouth is well hydrated and without lesions. NECK: Supple. No masses LUNGS: Clear to auscultation. No presence of rhonchi/wheezing/rales. Adequate chest expansion HEART: RRR, normal s1 and s2. ABDOMEN: Soft, nontender, no guarding, no peritoneal signs, and nondistended. BS +. No masses. EXTREMITIES: Without any cyanosis, clubbing, rash, lesions or edema. NEUROLOGIC: AOx3, no focal motor deficit. SKIN: no jaundice, no rashes   Christie [name]  has presented today for surgery, with the diagnosis of Lower Abdominal Pain.  The various methods of treatment have been discussed with the patient and family. After consideration of risks, benefits and other options for treatment, the patient has consented to  Procedure(s) with comments: COLONOSCOPY WITH PROPOFOL (N/A) - AM as a surgical intervention.  The patient's history has been reviewed, patient examined, no change in status, stable for surgery.  I have reviewed the patient's chart and labs.  Questions were answered to the patient's satisfaction.      Christie [name]

## 2021-02-23 NOTE — Anesthesia Postprocedure Evaluation (Signed)
Anesthesia Post Note  Patient: Christie [name]  Procedure(s) Performed: COLONOSCOPY WITH PROPOFOL (N/A ) BIOPSY POLYPECTOMY  Patient location during evaluation: Phase II Anesthesia Type: General Level of consciousness: awake, oriented, awake and alert and patient cooperative Pain management: satisfactory to patient Vital Signs Assessment: post-procedure vital signs reviewed and stable Respiratory status: spontaneous breathing, respiratory function stable and nonlabored ventilation Cardiovascular status: stable Postop Assessment: no apparent nausea or vomiting Anesthetic complications: no   No complications documented.   Last Vitals:  Vitals:   02/23/21 0928  BP: (!) 167/84  Pulse: 86  Resp: (!) 21  Temp: 36.6 C  SpO2: 100%    Last Pain:  Vitals:   02/23/21 1032  TempSrc:   PainSc: 0-No pain                 Dyan Labarbera

## 2021-02-23 NOTE — Discharge Instructions (Signed)
Colonoscopy, Adult, Care After This sheet gives you information about how to care for yourself after your procedure. Your doctor may also give you more specific instructions. If you have problems or questions, call your doctor. What can I expect after the procedure? After the procedure, it is common to have:  A small amount of blood in your poop (stool) for 24 hours.  Some gas.  Mild cramping or bloating in your belly (abdomen). Follow these instructions at home: Eating and drinking  Drink enough fluid to keep your pee (urine) pale yellow.  Follow instructions from your doctor about what you cannot eat or drink.  Return to your normal diet as told by your doctor. Avoid heavy or fried foods that are hard to digest.   Activity  Rest as told by your doctor.  Do not sit for a long time without moving. Get up to take short walks every 1-2 hours. This is important. Ask for help if you feel weak or unsteady.  Return to your normal activities as told by your doctor. Ask your doctor what activities are safe for you. To help cramping and bloating:  Try walking around.  Put heat on your belly as told by your doctor. Use the heat source that your doctor recommends, such as a moist heat pack or a heating pad. ? Put a towel between your skin and the heat source. ? Leave the heat on for 20-30 minutes. ? Remove the heat if your skin turns bright red. This is very important if you are unable to feel pain, heat, or cold. You may have a greater risk of getting burned.   General instructions  If you were given a medicine to help you relax (sedative) during your procedure, it can affect you for many hours. Do not drive or use machinery until your doctor says that it is safe.  For the first 24 hours after the procedure: ? Do not sign important documents. ? Do not drink alcohol. ? Do your daily activities more slowly than normal. ? Eat foods that are soft and easy to digest.  Take  over-the-counter or prescription medicines only as told by your doctor.  Keep all follow-up visits as told by your doctor. This is important. Contact a doctor if:  You have blood in your poop 2-3 days after the procedure. Get help right away if:  You have more than a small amount of blood in your poop.  You see large clumps of tissue (blood clots) in your poop.  Your belly is swollen.  You feel like you may vomit (nauseous).  You vomit.  You have a fever.  You have belly pain that gets worse, and medicine does not help your pain. Summary  After the procedure, it is common to have a small amount of blood in your poop. You may also have mild cramping and bloating in your belly.  If you were given a medicine to help you relax (sedative) during your procedure, it can affect you for many hours. Do not drive or use machinery until your doctor says that it is safe.  Get help right away if you have a lot of blood in your poop, feel like you may vomit, have a fever, or have more belly pain. This information is not intended to replace advice given to you by your health care provider. Make sure you discuss any questions you have with your health care provider. Document Revised: 10/11/2019 Document Reviewed: 07/01/2019 Elsevier Patient Education  2021  [employer] Colon Polyps  Colon polyps are tissue growths inside the colon, which is part of the large intestine. They are one of the types of polyps that can grow in the body. A polyp may be a round bump or a mushroom-shaped growth. You could have one polyp or more than one. Most colon polyps are noncancerous (benign). However, some colon polyps can become cancerous over time. Finding and removing the polyps early can help prevent this. What are the causes? The exact cause of colon polyps is not known. What increases the risk? The following factors may make you more likely to develop this condition:  Having a family history of colorectal  cancer or colon polyps.  Being older than 77 years of age.  Being younger than 77 years of age and having a significant family history of colorectal cancer or colon polyps or a genetic condition that puts you at higher risk of getting colon polyps.  Having inflammatory bowel disease, such as ulcerative colitis or Crohn's disease.  Having certain conditions passed from parent to child (hereditary conditions), such as: ? Familial adenomatous polyposis (FAP). ? Lynch syndrome. ? Turcot syndrome. ? Peutz-Jeghers syndrome. ? MUTYH-associated polyposis (MAP).  Being overweight.  Certain lifestyle factors. These include smoking cigarettes, drinking too much alcohol, not getting enough exercise, and eating a diet that is high in fat and red meat and low in fiber.  Having had childhood cancer that was treated with radiation of the abdomen. What are the signs or symptoms? Many times, there are no symptoms. If you have symptoms, they may include:  Blood coming from the rectum during a bowel movement.  Blood in the stool (feces). The blood may be bright red or very dark in color.  Pain in the abdomen.  A change in bowel habits, such as constipation or diarrhea. How is this diagnosed? This condition is diagnosed with a colonoscopy. This is a procedure in which a lighted, flexible scope is inserted into the opening between the buttocks (anus) and then passed into the colon to examine the area. Polyps are sometimes found when a colonoscopy is done as part of routine cancer screening tests. How is this treated? This condition is treated by removing any polyps that are found. Most polyps can be removed during a colonoscopy. Those polyps will then be tested for cancer. Additional treatment may be needed depending on the results of testing. Follow these instructions at home: Eating and drinking  Eat foods that are high in fiber, such as fruits, vegetables, and whole grains.  Eat foods that are  high in calcium and vitamin D, such as milk, cheese, yogurt, eggs, liver, fish, and broccoli.  Limit foods that are high in fat, such as fried foods and desserts.  Limit the amount of red meat, precooked or cured meat, or other processed meat that you eat, such as hot dogs, sausages, bacon, or meat loaves.  Limit sugary drinks.   Lifestyle  Maintain a healthy weight, or lose weight if recommended by your health care provider.  Exercise every day or as told by your health care provider.  Do not use any products that contain nicotine or tobacco, such as cigarettes, e-cigarettes, and chewing tobacco. If you need help quitting, ask your health care provider.  Do not drink alcohol if: ? Your health care provider tells you not to drink. ? You are pregnant, may be pregnant, or are planning to become pregnant.  If you drink alcohol: ? Limit how much you  use to:  0-1 drink a day for women.  0-2 drinks a day for men. ? Know how much alcohol is in your drink. In the [place] one drink equals one 12 oz bottle of beer (355 mL), one 5 oz glass of wine (148 mL), or one 1 oz glass of hard liquor (44 mL). General instructions  Take over-the-counter and prescription medicines only as told by your health care provider.  Keep all follow-up visits. This is important. This includes having regularly scheduled colonoscopies. Talk to your health care provider about when you need a colonoscopy. Contact a health care provider if:  You have new or worsening bleeding during a bowel movement.  You have new or increased blood in your stool.  You have a change in bowel habits.  You lose weight for no known reason. Summary  Colon polyps are tissue growths inside the colon, which is part of the large intestine. They are one type of polyp that can grow in the body.  Most colon polyps are noncancerous (benign), but some can become cancerous over time.  This condition is diagnosed with a colonoscopy.  This  condition is treated by removing any polyps that are found. Most polyps can be removed during a colonoscopy. This information is not intended to replace advice given to you by your health care provider. Make sure you discuss any questions you have with your health care provider. Document Revised: 03/25/2020 Document Reviewed: 03/25/2020 Elsevier Patient Education  2021 [place] are being discharged to home.  Resume your previous diet.  We are waiting for your pathology results.  Your physician has recommended a repeat colonoscopy for surveillance based on pathology results.  Can take IBGard/pepperming oil every 8 hours as needed for abdominal pain

## 2021-02-23 NOTE — Transfer of Care (Signed)
Immediate Anesthesia Transfer of Care Note  Patient: Christie [name]  Procedure(s) Performed: COLONOSCOPY WITH PROPOFOL (N/A ) BIOPSY POLYPECTOMY  Patient Location: PACU  Anesthesia Type:General  Level of Consciousness: awake, alert , oriented and patient cooperative  Airway & Oxygen Therapy: Patient Spontanous Breathing  Post-op Assessment: Report given to RN, Post -op Vital signs reviewed and stable and Patient moving all extremities X 4  Post vital signs: Reviewed and stable  Last Vitals:  Vitals Value Taken Time  BP    Temp    Pulse    Resp    SpO2      Last Pain:  Vitals:   02/23/21 1032  TempSrc:   PainSc: 0-No pain      Patients Stated Pain Goal: 7 (98/26/41 [id number])  Complications: No complications documented.

## 2021-02-23 NOTE — Anesthesia Preprocedure Evaluation (Addendum)
Anesthesia Evaluation  Patient identified by MRN, date of birth, ID band Patient awake    Reviewed: Allergy & Precautions, NPO status , Patient's Chart, lab work & pertinent test results  History of Anesthesia Complications Negative for: history of anesthetic complications  Airway Mallampati: II  TM Distance: >3 FB Neck ROM: Full    Dental  (+) Dental Advisory Given, Caps Crowns :   Pulmonary neg pulmonary ROS,    Pulmonary exam normal breath sounds clear to auscultation       Cardiovascular Exercise Tolerance: Good hypertension, Pt. on medications + Valvular Problems/Murmurs (mild) AS  Rhythm:Regular Rate:Normal + Systolic murmurs (mild aortic stenosis as per echo) 1. Left ventricular ejection fraction, by estimation, is 60 to 65%. The left ventricle has normal function. The left ventricle has no regional wall motion abnormalities. Left ventricular diastolic parameters are consistent with Grade I diastolic dysfunction (impaired relaxation).  2. Right ventricular systolic function is normal. The right ventricular size is normal. There is normal pulmonary artery systolic pressure. The estimated right ventricular systolic pressure is 31.5 mmHg.  3. Left atrial size was mildly dilated.  4. The mitral valve is grossly normal. Trivial mitral valve  regurgitation.  5. The aortic valve is tricuspid. Aortic valve regurgitation is mild. Mild aortic valve stenosis. Aortic regurgitation PHT measures 539 msec. Aortic valve mean gradient measures 12.0 mmHg. Aortic valve Vmax measures 2.46 m/s.  6. The inferior vena cava is normal in size with greater than 50% respiratory variability, suggesting right atrial pressure of 3 mmHg.    Neuro/Psych PSYCHIATRIC DISORDERS Depression negative neurological ROS     GI/Hepatic negative GI ROS, Neg liver ROS,   Endo/Other  diabetes, Well Controlled, Type 2, Oral Hypoglycemic Agents  Renal/GU       Musculoskeletal  (+) Arthritis  (back sx), Osteoarthritis,    Abdominal   Peds  Hematology negative hematology ROS (+)   Anesthesia Other Findings   Reproductive/Obstetrics negative OB ROS                            Anesthesia Physical Anesthesia Plan  ASA: II  Anesthesia Plan: General   Post-op Pain Management:    Induction: Intravenous  PONV Risk Score and Plan: TIVA and Propofol infusion  Airway Management Planned: Nasal Cannula and Natural Airway  Additional Equipment:   Intra-op Plan:   Post-operative Plan:   Informed Consent: I have reviewed the patients History and Physical, chart, labs and discussed the procedure including the risks, benefits and alternatives for the proposed anesthesia with the patient or authorized representative who has indicated his/her understanding and acceptance.     Dental advisory given  Plan Discussed with: CRNA and Surgeon  Anesthesia Plan Comments:         Anesthesia Quick Evaluation

## 2021-02-24 ENCOUNTER — Other Ambulatory Visit: Payer: Self-pay | Admitting: Family Medicine

## 2021-02-24 DIAGNOSIS — E119 Type 2 diabetes mellitus without complications: Secondary | ICD-10-CM

## 2021-02-24 LAB — SURGICAL PATHOLOGY

## 2021-02-26 ENCOUNTER — Encounter (HOSPITAL_COMMUNITY): Payer: Self-pay | Admitting: Gastroenterology

## 2021-03-01 ENCOUNTER — Other Ambulatory Visit: Payer: Self-pay | Admitting: Family Medicine

## 2021-03-01 DIAGNOSIS — R1030 Lower abdominal pain, unspecified: Secondary | ICD-10-CM | POA: Diagnosis not present

## 2021-03-01 LAB — COMPREHENSIVE METABOLIC PANEL
ALT: 29 IU/L (ref 0–32)
AST: 27 IU/L (ref 0–40)
Albumin/Globulin Ratio: 2.5 — ABNORMAL HIGH (ref 1.2–2.2)
Albumin: 4.5 g/dL (ref 3.7–4.7)
Alkaline Phosphatase: 172 IU/L — ABNORMAL HIGH (ref 44–121)
BUN/Creatinine Ratio: 25 (ref 12–28)
BUN: 12 mg/dL (ref 8–27)
Bilirubin Total: 0.7 mg/dL (ref 0.0–1.2)
CO2: 28 mmol/L (ref 20–29)
Calcium: 9.5 mg/dL (ref 8.7–10.3)
Chloride: 106 mmol/L (ref 96–106)
Creatinine, Ser: 0.48 mg/dL — ABNORMAL LOW (ref 0.57–1.00)
Globulin, Total: 1.8 g/dL (ref 1.5–4.5)
Glucose: 99 mg/dL (ref 65–99)
Potassium: 3.9 mmol/L (ref 3.5–5.2)
Sodium: 141 mmol/L (ref 134–144)
Total Protein: 6.3 g/dL (ref 6.0–8.5)
eGFR: 98 mL/min/{1.73_m2} (ref >59)

## 2021-03-01 LAB — CBC WITH DIFFERENTIAL/PLATELET
Basophils Absolute: 0 10*3/uL (ref 0.0–0.2)
Basos: 0 %
EOS (ABSOLUTE): 0.2 10*3/uL (ref 0.0–0.4)
Eos: 3 %
Hematocrit: 45.2 % (ref 34.0–46.6)
Hemoglobin: 15.1 g/dL (ref 11.1–15.9)
Lymphocytes Absolute: 2.2 10*3/uL (ref 0.7–3.1)
Lymphs: 34 %
MCH: 30.8 pg (ref 26.6–33.0)
MCHC: 33.4 g/dL (ref 31.5–35.7)
MCV: 92 fL (ref 79–97)
Monocytes Absolute: 0.6 10*3/uL (ref 0.1–0.9)
Monocytes: 9 %
Neutrophils Absolute: 3.5 10*3/uL (ref 1.4–7.0)
Neutrophils: 54 %
Platelets: 143 10*3/uL — ABNORMAL LOW (ref 150–450)
RBC: 4.9 x10E6/uL (ref 3.77–5.28)
RDW: 13.4 % (ref 11.7–15.4)
WBC: 6.5 10*3/uL (ref 3.4–10.8)

## 2021-03-02 LAB — CBC WITH DIFFERENTIAL/PLATELET

## 2021-03-02 LAB — COMPREHENSIVE METABOLIC PANEL

## 2021-03-02 LAB — SPECIMEN STATUS REPORT

## 2021-03-04 ENCOUNTER — Ambulatory Visit: Payer: Medicare PPO | Admitting: Family Medicine

## 2021-03-04 ENCOUNTER — Other Ambulatory Visit: Payer: Self-pay

## 2021-03-04 ENCOUNTER — Encounter: Payer: Self-pay | Admitting: Family Medicine

## 2021-03-04 VITALS — BP 138/84 | HR 74 | Temp 95.6°F | Wt 174.8 lb

## 2021-03-04 DIAGNOSIS — E1169 Type 2 diabetes mellitus with other specified complication: Secondary | ICD-10-CM

## 2021-03-04 DIAGNOSIS — G4709 Other insomnia: Secondary | ICD-10-CM

## 2021-03-04 DIAGNOSIS — I10 Essential (primary) hypertension: Secondary | ICD-10-CM

## 2021-03-04 DIAGNOSIS — E785 Hyperlipidemia, unspecified: Secondary | ICD-10-CM

## 2021-03-04 DIAGNOSIS — R1030 Lower abdominal pain, unspecified: Secondary | ICD-10-CM

## 2021-03-04 NOTE — Progress Notes (Signed)
   Subjective:    Patient ID: Christie [name], female    DOB: 08/14/1946, 75 y.o.   MRN: [id number]  HPI Pt here for follow up and to go over lab work.   Stomach issues back in November-colonoscopy last week (removed polyp and it came back ok) Pt was instructed to IBgard due to she may be having IBS.  Patient hopes that this medication will help If it does not she is not sure what to do next She states she does have a follow-up visit with gastroenterology in April  Patient does take Xanax at nighttime does not use it every night denies abusing it Does take her blood pressure medicine regular basis She also takes her diabetes medicine Jardiance and glipizide not having any low sugar spells  In addition to this she is taking her cholesterol medicine regular basis no problems with Taking blood pressure medicine regular basis no problems Moods are doing overall well would like to continue her Zoloft  Review of Systems See above    Objective:   Physical Exam  Lungs clear respiratory rate normal heart regular pulse normal extremities no edema skin warm dry      Assessment & Plan:  1. Hyperlipidemia associated with type 2 diabetes mellitus ([place]) Diabetes unfortunately the most recent lab work was lab work that was ordered when she had abdominal discomfort we are trying to see if they can add A1c she will continue her diabetic medicine and cholesterol medicine as planned  2. Essential hypertension, benign Blood pressure good control continue current measures  3. Other insomnia Utilizing Xanax as needed for insomnia does not abuse it  4. Lower abdominal pain No severe lower abdominal pain she has had a previous CAT scan which did not show any tumors and also had colonoscopy she will utilize the OTC medicine that the gastroenterologist recommends if that does not do well now potentially they will try something different  Follow-up again within 4 to 6 months Opinion allergy

## 2021-03-05 NOTE — Progress Notes (Signed)
[facility] states the will pull the sample and see if they can run it. Pt is aware

## 2021-03-09 ENCOUNTER — Other Ambulatory Visit: Payer: Self-pay | Admitting: Family Medicine

## 2021-03-12 ENCOUNTER — Other Ambulatory Visit: Payer: Self-pay | Admitting: Family Medicine

## 2021-03-12 DIAGNOSIS — E1169 Type 2 diabetes mellitus with other specified complication: Secondary | ICD-10-CM

## 2021-03-12 DIAGNOSIS — I10 Essential (primary) hypertension: Secondary | ICD-10-CM

## 2021-03-12 DIAGNOSIS — Z79899 Other long term (current) drug therapy: Secondary | ICD-10-CM

## 2021-03-12 DIAGNOSIS — E119 Type 2 diabetes mellitus without complications: Secondary | ICD-10-CM

## 2021-03-12 DIAGNOSIS — E785 Hyperlipidemia, unspecified: Secondary | ICD-10-CM

## 2021-03-25 ENCOUNTER — Telehealth: Payer: Self-pay | Admitting: Family Medicine

## 2021-03-25 NOTE — Telephone Encounter (Signed)
Attempted to schedule AWV. Unable to LVM.  Will try at later time.  

## 2021-03-29 ENCOUNTER — Other Ambulatory Visit: Payer: Self-pay | Admitting: Family Medicine

## 2021-03-29 DIAGNOSIS — E119 Type 2 diabetes mellitus without complications: Secondary | ICD-10-CM

## 2021-03-29 NOTE — Telephone Encounter (Signed)
6 mo rf

## 2021-03-30 ENCOUNTER — Telehealth: Payer: Self-pay | Admitting: Family Medicine

## 2021-03-30 MED ORDER — AMLODIPINE BESYLATE 10 MG PO TABS: 10.0000 mg | ORAL_TABLET | Freq: Every day | ORAL | 5 refills | Status: DC

## 2021-03-30 NOTE — Addendum Note (Signed)
Addended by: Dairl Ponder on: 03/30/2021 09:38 AM   Modules accepted: Orders

## 2021-03-30 NOTE — Telephone Encounter (Signed)
Nurses The patient recently sent in blood pressure readings I reviewed over these blood pressure readings  Please inform the patient I reviewed over her readings multiple readings are considered on the high side especially the upper number. Based upon this and the medications that she is already taking I would recommend the following Continue the losartan but increase amlodipine New dose to 10 mg once daily, #30 with 5 refills Patient has a follow-up visit in June 2 please keep  Please let the patient know that amlodipine does help bring the top number down but at a higher dose can cause puffiness of the ankles.  If that occurs especially if it is severe please let us now otherwise send [place] some blood pressure readings within the next few weeks for review  If any troubles follow-up sooner

## 2021-03-30 NOTE — Telephone Encounter (Signed)
Discussed with pt. Pt verbalized understanding. New dose was sent to pharm.

## 2021-04-05 ENCOUNTER — Other Ambulatory Visit: Payer: Self-pay | Admitting: Family Medicine

## 2021-04-13 DIAGNOSIS — H35033 Hypertensive retinopathy, bilateral: Secondary | ICD-10-CM | POA: Diagnosis not present

## 2021-04-13 LAB — HM DIABETES EYE EXAM

## 2021-05-05 ENCOUNTER — Other Ambulatory Visit: Payer: Self-pay | Admitting: Family Medicine

## 2021-05-10 ENCOUNTER — Telehealth (INDEPENDENT_AMBULATORY_CARE_PROVIDER_SITE_OTHER): Payer: Medicare PPO | Admitting: Gastroenterology

## 2021-05-10 ENCOUNTER — Encounter (INDEPENDENT_AMBULATORY_CARE_PROVIDER_SITE_OTHER): Payer: Self-pay | Admitting: Gastroenterology

## 2021-05-10 ENCOUNTER — Other Ambulatory Visit: Payer: Self-pay

## 2021-05-10 DIAGNOSIS — K589 Irritable bowel syndrome without diarrhea: Secondary | ICD-10-CM | POA: Insufficient documentation

## 2021-05-10 DIAGNOSIS — K580 Irritable bowel syndrome with diarrhea: Secondary | ICD-10-CM | POA: Diagnosis not present

## 2021-05-10 NOTE — Progress Notes (Signed)
Christie [name], M.D. Gastroenterology & Hepatology [facility] [address] [place], [place] [address] Primary Care Physician: Kathyrn Drown, MD [place] [id number]  This is a telephone virtual visit.  It required patient-provider interaction for the medical decision making as documented below. The patient has consented and agreed to proceed with a Telehealth encounter given the current Coronavirus pandemic.  VIRTUAL VISIT NOTE Patient location: home Provider location: home  I will communicate my assessment and recommendations to the referring MD via EMR.  Problems: 1. IBS-D  History of Present Illness: Christie [name] is a 75 y.o. female PMH vulvar cancer, depression, diabetes, HTN, HLD and IBS-D who presents for follow-up of abdominal pain and diarrhea.  The patient was last seen in clinic on 02/10/2021.  The patient was explained that her symptoms were likely related to IBS and she was counseled to implement a low FODMAP diet.  Colonoscopy was performed and she was found to have 1 small polyp in the ascending colon which was a tubular adenoma.  Random colonic biopsies were negative for microscopic colitis.  She was also advised to take IBgard as her symptoms persisted at that time..  Patient states that she has the lower abdominal pain 2-3 times a week, which improves significantly with the use of IBGard, which she takes every 12 hours. States that she is having 1-2 BMs per day with normal consistency.  The patient denies having any nausea, vomiting, fever, chills, hematochezia, melena, hematemesis, abdominal distention, abdominal pain, diarrhea, jaundice, pruritus or weight loss.   Past Medical History: Past Medical History:  Diagnosis Date  . Cancer [facility]) 1999   external vulva lesion  . Depression   . DM (diabetes mellitus) ([place]) 2012   oral medications  . Elevated liver enzymes   . History of  kidney stones   . Hyperlipidemia   . Hyperlipidemia associated with type 2 diabetes mellitus ([place]) 02/25/2020  . Hypertension 2000  . Spastic colon     Past Surgical History: Past Surgical History:  Procedure Laterality Date  . ANTERIOR LAT LUMBAR FUSION Right 07/04/2017   Procedure: Right LUMBAR TWO-THREE LUMBAR THREE-FOUR LUMBAR FOUR-FIVE Anterior lateral lumbar interbody fusion with percutaneous pedicle screws;  Surgeon: Erline Levine, MD;  Location: [facility];  Service: Neurosurgery;  Laterality: Right;  Right lateral approach   . ANTERIOR LATERAL LUMBAR FUSION WITH PERCUTANEOUS SCREW 3 LEVEL N/A 07/04/2017   Procedure: PERCUTANEOUS SCREW THREE LEVEL LUMBAR TWO-THREE LUMBAR THREE-FOUR LUMBAR FOUR-FIVE;  Surgeon: Erline Levine, MD;  Location: [facility];  Service: Neurosurgery;  Laterality: N/A;  Prone  . BACK SURGERY  2004 and 2017  . BIOPSY  02/23/2021   Procedure: BIOPSY;  Surgeon: Harvel Quale, MD;  [facility];  Service: Gastroenterology;;  . CHOLECYSTECTOMY    . COLONOSCOPY  feb 2005  . COLONOSCOPY N/A 02/13/2014   Procedure: COLONOSCOPY;  Surgeon: Rogene Houston, MD;  [facility];  Service: Endoscopy;  Laterality: N/A;  930-moved to 100 Ann notified pt  . COLONOSCOPY WITH PROPOFOL N/A 02/23/2021   Procedure: COLONOSCOPY WITH PROPOFOL;  Surgeon: Harvel Quale, MD;  [facility];  Service: Gastroenterology;  Laterality: N/A;  AM  . ESOPHAGOGASTRODUODENOSCOPY  09/2005  . EYE SURGERY Right    catatract removed  . EYE SURGERY Right    macular   surgery   . KIDNEY STONE SURGERY     for a staghorn kidney stone  .  POLYPECTOMY  02/23/2021   Procedure: POLYPECTOMY;  Surgeon: Montez Morita, Quillian Quince, MD;  [facility];  Service: Gastroenterology;;  . STAPEDECTOMY Bilateral    Right 1991 and Left 1994  . TOTAL ABDOMINAL HYSTERECTOMY  1982  . vulva cancer surgery  1999    Family History: Family History  Problem Relation Age of  Onset  . Diabetes Father   . Hypertension Father   . Dementia Father   . Pancreatic cancer Mother     Social History: Social History   Tobacco Use  Smoking Status Never Smoker  Smokeless Tobacco Never Used   Social History   Substance and Sexual Activity  Alcohol Use No  . Alcohol/week: 0.0 standard drinks   Social History   Substance and Sexual Activity  Drug Use No    Allergies: Allergies  Allergen Reactions  . Atorvastatin Other (See Comments)    ELEVATED LFT's LIVER ENZYMES GO UP  . Metformin And Related Other (See Comments)    REACTION IS SIDE EFFECT ,  UNSPECIFIED REACTION  DOESN'T KNOW WHY THIS IS ON THE LIST  . Oxycodone-Acetaminophen Other (See Comments)  . Ace Inhibitors     UNSPECIFIED REACTION     Medications: Current Outpatient Medications  Medication Sig Dispense Refill  . ACCU-CHEK GUIDE test strip USE TO CHECK BLOOD SUGAR ONCE DAILY AS DIRECTED 50 strip 0  . acetaminophen (TYLENOL) 500 MG tablet Take 1,000 mg by mouth every 6 (six) hours as needed (pain).    Marland Kitchen ALPRAZolam (XANAX) 0.5 MG tablet TAKE (1) TABLET BY MOUTH AT BEDTIME. (Patient taking differently: Take 0.5 mg by mouth at bedtime.) 30 tablet 2  . amLODipine (NORVASC) 10 MG tablet Take 1 tablet (10 mg total) by mouth daily. 30 tablet 5  . Ascorbic Acid (VITAMIN C) 1000 MG tablet Take 1,000 mg by mouth daily.    . cholecalciferol (VITAMIN D) 1000 UNITS tablet Take 1,000 Units by mouth daily.    Marland Kitchen docusate sodium (COLACE) 100 MG capsule Take 300 mg by mouth at bedtime.    Marland Kitchen glipiZIDE (GLUCOTROL) 5 MG tablet TAKE 2 TABLETS IN THE MORNING AND 2 1/2 TABLETS IN THE EVENING, WITH MEALS 135 tablet 5  . JARDIANCE 25 MG TABS tablet TAKE ONE TABLET BY MOUTH ONCE DAILY. 90 tablet 0  . losartan (COZAAR) 100 MG tablet TAKE (1) TABLET BY MOUTH ONCE DAILY. 30 tablet 3  . Multiple Vitamins-Minerals (MACULAR VITAMIN BENEFIT PO) Take 1 tablet by mouth in the morning and at bedtime.    Marland Kitchen OVER THE COUNTER  MEDICATION IB Guard BID    . rosuvastatin (CRESTOR) 40 MG tablet TAKE ONE TABLET BY MOUTH ONCE DAILY. (Patient taking differently: Take 40 mg by mouth at bedtime. TAKE ONE TABLET BY MOUTH ONCE DAILY.) 90 tablet 0  . sertraline (ZOLOFT) 50 MG tablet TAKE (1) TABLET BY MOUTH ONCE DAILY. 30 tablet 5  . zinc gluconate 50 MG tablet Take 50 mg by mouth daily.     No current facility-administered medications for this visit.    Review of Systems: GENERAL: negative for malaise, night sweats HEENT: No changes in hearing or vision, no nose bleeds or other nasal problems. NECK: Negative for lumps, goiter, pain and significant neck swelling RESPIRATORY: Negative for cough, wheezing CARDIOVASCULAR: Negative for chest pain, leg swelling, palpitations, orthopnea GI: SEE HPI MUSCULOSKELETAL: Negative for joint pain or swelling, back pain, and muscle pain. SKIN: Negative for lesions, rash PSYCH: Negative for sleep disturbance, mood disorder and recent psychosocial stressors.  HEMATOLOGY Negative for prolonged bleeding, bruising easily, and swollen nodes. ENDOCRINE: Negative for cold or heat intolerance, polyuria, polydipsia and goiter. NEURO: negative for tremor, gait imbalance, syncope and seizures. The remainder of the review of systems is noncontributory.   Physical Exam: No exam was performed as this was a telephone encounter  Imaging/Labs: as above  I personally reviewed and interpreted the available labs, imaging and endoscopic files.  Impression and Plan: ANISSIA WESSELLS is a 75 y.o. female PMH vulvar cancer, depression, diabetes, HTN, HLD and IBS-D who presents for follow-up of abdominal pain and diarrhea.  The patient has presented major improvement of her symptoms after implementing the use of peppermint oil.  She denies having any complaints at the moment.  Her symptoms are likely functional in nature but are better controlled at the moment.  I advised her to continue taking the peppermint oil  as needed.  - Continue peppermint oil/IBGard as needed for abdominal pain  All questions were answered.      Total visit time: I spent a total of  20 minutes  Christie Peppers, MD Gastroenterology and Hepatology [facility]

## 2021-05-10 NOTE — Patient Instructions (Signed)
Continue peppermint oil/IBGard as needed for abdominal pain

## 2021-05-19 ENCOUNTER — Other Ambulatory Visit: Payer: Self-pay | Admitting: Family Medicine

## 2021-06-01 DIAGNOSIS — Z79899 Other long term (current) drug therapy: Secondary | ICD-10-CM | POA: Diagnosis not present

## 2021-06-01 DIAGNOSIS — E1169 Type 2 diabetes mellitus with other specified complication: Secondary | ICD-10-CM | POA: Diagnosis not present

## 2021-06-01 DIAGNOSIS — I10 Essential (primary) hypertension: Secondary | ICD-10-CM | POA: Diagnosis not present

## 2021-06-01 DIAGNOSIS — E119 Type 2 diabetes mellitus without complications: Secondary | ICD-10-CM | POA: Diagnosis not present

## 2021-06-01 DIAGNOSIS — E785 Hyperlipidemia, unspecified: Secondary | ICD-10-CM | POA: Diagnosis not present

## 2021-06-02 LAB — HEPATIC FUNCTION PANEL
ALT: 29 IU/L (ref 0–32)
AST: 23 IU/L (ref 0–40)
Albumin: 4.7 g/dL (ref 3.7–4.7)
Alkaline Phosphatase: 153 IU/L — ABNORMAL HIGH (ref 44–121)
Bilirubin Total: 0.6 mg/dL (ref 0.0–1.2)
Bilirubin, Direct: 0.19 mg/dL (ref 0.00–0.40)
Total Protein: 6.9 g/dL (ref 6.0–8.5)

## 2021-06-02 LAB — LIPID PANEL
Chol/HDL Ratio: 2.3 ratio (ref 0.0–4.4)
Cholesterol, Total: 160 mg/dL (ref 100–199)
HDL: 70 mg/dL (ref >39)
LDL Chol Calc (NIH): 71 mg/dL (ref 0–99)
Triglycerides: 106 mg/dL (ref 0–149)
VLDL Cholesterol Cal: 19 mg/dL (ref 5–40)

## 2021-06-02 LAB — HEMOGLOBIN A1C
Est. average glucose Bld gHb Est-mCnc: 160 mg/dL
Hgb A1c MFr Bld: 7.2 % — ABNORMAL HIGH (ref 4.8–5.6)

## 2021-06-04 ENCOUNTER — Other Ambulatory Visit: Payer: Self-pay | Admitting: Family Medicine

## 2021-06-08 ENCOUNTER — Ambulatory Visit: Payer: Medicare PPO | Admitting: Family Medicine

## 2021-06-08 ENCOUNTER — Encounter: Payer: Self-pay | Admitting: Family Medicine

## 2021-06-08 ENCOUNTER — Other Ambulatory Visit: Payer: Self-pay

## 2021-06-08 VITALS — BP 133/66 | HR 75 | Temp 97.7°F | Ht 64.0 in | Wt 174.0 lb

## 2021-06-08 DIAGNOSIS — E782 Mixed hyperlipidemia: Secondary | ICD-10-CM

## 2021-06-08 DIAGNOSIS — E119 Type 2 diabetes mellitus without complications: Secondary | ICD-10-CM

## 2021-06-08 DIAGNOSIS — I10 Essential (primary) hypertension: Secondary | ICD-10-CM

## 2021-06-08 DIAGNOSIS — Z79899 Other long term (current) drug therapy: Secondary | ICD-10-CM | POA: Diagnosis not present

## 2021-06-08 DIAGNOSIS — G4709 Other insomnia: Secondary | ICD-10-CM | POA: Diagnosis not present

## 2021-06-08 DIAGNOSIS — E1169 Type 2 diabetes mellitus with other specified complication: Secondary | ICD-10-CM

## 2021-06-08 DIAGNOSIS — E785 Hyperlipidemia, unspecified: Secondary | ICD-10-CM

## 2021-06-08 MED ORDER — EMPAGLIFLOZIN 25 MG PO TABS: 25.0000 mg | ORAL_TABLET | Freq: Every day | ORAL | 5 refills | Status: DC

## 2021-06-08 MED ORDER — LOSARTAN POTASSIUM 100 MG PO TABS: ORAL_TABLET | ORAL | 5 refills | Status: DC

## 2021-06-08 MED ORDER — ALPRAZOLAM 0.5 MG PO TABS: ORAL_TABLET | ORAL | 5 refills | Status: DC

## 2021-06-08 MED ORDER — ROSUVASTATIN CALCIUM 40 MG PO TABS: 40.0000 mg | ORAL_TABLET | Freq: Every day | ORAL | 5 refills | Status: DC

## 2021-06-08 NOTE — Progress Notes (Addendum)
   Subjective:    Patient ID: Christie [name], female    DOB: 08-Dec-1946, 75 y.o.   MRN: [id number]  Diabetes She presents for her follow-up diabetic visit. She has type 2 diabetes mellitus. Current diabetic treatments: jardiance, glipizide. Home blood sugar record trend: around 120. Eye exam is current (spring of 2022 goes back this october).   Pt brought in bp readings.  We reviewed over blood pressure readings some of the top numbers are elevated other numbers look good. Review of Systems     Objective:   Physical Exam General-in no acute distress Eyes-no discharge Lungs-respiratory rate normal, CTA CV-mild aortic stenosis murmur,RRR Extremities skin warm dry no edema Neuro grossly normal Behavior normal, alert I reviewed over the nurses notes and It should be noted that history physical review of labs and plan and documentation was completely done by myself thank you      Assessment & Plan:  1. Mixed hyperlipidemia Continue cholesterol medicine check labs before next visit - Lipid panel  2. Other insomnia May use Xanax at nighttime uses it responsibly drug registry checked prescription sent in  3. Essential hypertension, benign Blood pressure was checked sitting and standing when she stands it drops appreciably at least 10 points I do not recommend bumping up the dose of any blood pressure medicine currently too much risk that would cause dizziness and potential falls - Basic metabolic panel  4. Hyperlipidemia associated with type 2 diabetes mellitus (HCC) Check labs healthy diet A1c improved at 7.2 follow-up in the fall for a wellness check and lab work - Hemoglobin A1c - Microalbumin / creatinine urine ratio  5. High risk medication use Labs ordered - Hepatic function panel  6. Diabetes mellitus without complication ([place]) Healthy diet regular physical activity recommended - Hemoglobin A1c - Microalbumin / creatinine urine ratio  Mild aortic stenosis murmur  follow-up echo with them 6 to 12 months

## 2021-06-08 NOTE — Patient Instructions (Signed)
  You may do your lab work 1 to 2 weeks before your follow-up visit in the fall

## 2021-06-22 ENCOUNTER — Other Ambulatory Visit: Payer: Self-pay | Admitting: Family Medicine

## 2021-08-27 ENCOUNTER — Other Ambulatory Visit: Payer: Self-pay | Admitting: Family Medicine

## 2021-08-27 DIAGNOSIS — E119 Type 2 diabetes mellitus without complications: Secondary | ICD-10-CM

## 2021-09-01 ENCOUNTER — Other Ambulatory Visit: Payer: Self-pay | Admitting: Family Medicine

## 2021-09-20 ENCOUNTER — Other Ambulatory Visit: Payer: Self-pay

## 2021-09-20 ENCOUNTER — Ambulatory Visit: Payer: Medicare PPO | Admitting: Family Medicine

## 2021-09-20 VITALS — BP 148/73 | HR 73 | Temp 97.5°F | Ht 64.0 in

## 2021-09-20 DIAGNOSIS — R748 Abnormal levels of other serum enzymes: Secondary | ICD-10-CM | POA: Diagnosis not present

## 2021-09-20 DIAGNOSIS — R1030 Lower abdominal pain, unspecified: Secondary | ICD-10-CM | POA: Diagnosis not present

## 2021-09-20 DIAGNOSIS — E785 Hyperlipidemia, unspecified: Secondary | ICD-10-CM | POA: Diagnosis not present

## 2021-09-20 DIAGNOSIS — E782 Mixed hyperlipidemia: Secondary | ICD-10-CM

## 2021-09-20 DIAGNOSIS — E1169 Type 2 diabetes mellitus with other specified complication: Secondary | ICD-10-CM

## 2021-09-20 DIAGNOSIS — I10 Essential (primary) hypertension: Secondary | ICD-10-CM | POA: Diagnosis not present

## 2021-09-20 LAB — POCT URINALYSIS DIP (MANUAL ENTRY)
Bilirubin, UA: NEGATIVE
Glucose, UA: >=1000 mg/dL — AB
Ketones, POC UA: NEGATIVE mg/dL
Leukocytes, UA: NEGATIVE
Nitrite, UA: NEGATIVE
Protein Ur, POC: NEGATIVE mg/dL
Spec Grav, UA: 1.02 (ref 1.010–1.025)
Urobilinogen, UA: 0.2 E.U./dL
pH, UA: 6 (ref 5.0–8.0)

## 2021-09-20 NOTE — Progress Notes (Signed)
   Subjective:    Patient ID: Christie [name], female    DOB: 03-Feb-1946, 75 y.o.   MRN: [id number]  HPI  Low abdominal pain x 2 weeks  Patient with intermittent lower abdominal pain over the past couple weeks no vomiting no diarrhea.  No blood in in her urine.  States occasionally hemorrhoids because of blood in her stools she is up-to-date on her colonoscopy.  Her energy level is okay she is not losing weight appetite doing okay fairly persistent pain over the past several months worsening over the past couple weeks now to the point where it is interrupting her rest as well as noticeable throughout the day  Has a history of vulvar cancer a family history of cancer patient is worried that she may have underlying cancer going on Review of Systems     Objective:   Physical Exam  NAD lungs are clear hearts regular abdomen is soft with subjective discomfort in the lower abdomen patient does have tenderness in the lower abdomen.  No guarding or rebound extremities no edema skin warm dry Previous colonoscopy note reviewed       Assessment & Plan:   Patient has underlying blood pressure diabetes hyperlipidemia does need lab work also has a follow-up visit in early November Lungs had a lower abdominal pain that is progressive over the past few weeks been present for a few months would benefit from scan await lab work first

## 2021-09-21 ENCOUNTER — Encounter: Payer: Self-pay | Admitting: Family Medicine

## 2021-09-21 LAB — BASIC METABOLIC PANEL
BUN/Creatinine Ratio: 32 — ABNORMAL HIGH (ref 12–28)
BUN: 21 mg/dL (ref 8–27)
CO2: 23 mmol/L (ref 20–29)
Calcium: 10 mg/dL (ref 8.7–10.3)
Chloride: 105 mmol/L (ref 96–106)
Creatinine, Ser: 0.66 mg/dL (ref 0.57–1.00)
Glucose: 148 mg/dL — ABNORMAL HIGH (ref 70–99)
Potassium: 4.1 mmol/L (ref 3.5–5.2)
Sodium: 143 mmol/L (ref 134–144)
eGFR: 90 mL/min/{1.73_m2} (ref >59)

## 2021-09-21 LAB — CBC WITH DIFFERENTIAL/PLATELET
Basophils Absolute: 0.1 10*3/uL (ref 0.0–0.2)
Basos: 1 %
EOS (ABSOLUTE): 0.1 10*3/uL (ref 0.0–0.4)
Eos: 1 %
Hematocrit: 46.7 % — ABNORMAL HIGH (ref 34.0–46.6)
Hemoglobin: 15.2 g/dL (ref 11.1–15.9)
Immature Grans (Abs): 0 10*3/uL (ref 0.0–0.1)
Immature Granulocytes: 0 %
Lymphocytes Absolute: 2 10*3/uL (ref 0.7–3.1)
Lymphs: 26 %
MCH: 30 pg (ref 26.6–33.0)
MCHC: 32.5 g/dL (ref 31.5–35.7)
MCV: 92 fL (ref 79–97)
Monocytes Absolute: 0.6 10*3/uL (ref 0.1–0.9)
Monocytes: 8 %
Neutrophils Absolute: 5 10*3/uL (ref 1.4–7.0)
Neutrophils: 64 %
Platelets: 158 10*3/uL (ref 150–450)
RBC: 5.06 x10E6/uL (ref 3.77–5.28)
RDW: 12.6 % (ref 11.7–15.4)
WBC: 7.8 10*3/uL (ref 3.4–10.8)

## 2021-09-21 LAB — LIPASE: Lipase: 49 U/L (ref 14–85)

## 2021-09-21 LAB — HEPATIC FUNCTION PANEL
ALT: 29 IU/L (ref 0–32)
AST: 26 IU/L (ref 0–40)
Albumin: 4.4 g/dL (ref 3.7–4.7)
Alkaline Phosphatase: 145 IU/L — ABNORMAL HIGH (ref 44–121)
Bilirubin Total: 0.4 mg/dL (ref 0.0–1.2)
Bilirubin, Direct: 0.1 mg/dL (ref 0.00–0.40)
Total Protein: 6.8 g/dL (ref 6.0–8.5)

## 2021-09-21 LAB — LIPID PANEL
Chol/HDL Ratio: 2.2 ratio (ref 0.0–4.4)
Cholesterol, Total: 151 mg/dL (ref 100–199)
HDL: 70 mg/dL (ref >39)
LDL Chol Calc (NIH): 62 mg/dL (ref 0–99)
Triglycerides: 107 mg/dL (ref 0–149)
VLDL Cholesterol Cal: 19 mg/dL (ref 5–40)

## 2021-09-21 LAB — HEMOGLOBIN A1C
Est. average glucose Bld gHb Est-mCnc: 163 mg/dL
Hgb A1c MFr Bld: 7.3 % — ABNORMAL HIGH (ref 4.8–5.6)

## 2021-09-21 NOTE — Addendum Note (Signed)
Addended by: Dairl Ponder on: 09/21/2021 04:41 PM   Modules accepted: Orders

## 2021-09-25 LAB — URINE CULTURE

## 2021-09-27 ENCOUNTER — Other Ambulatory Visit: Payer: Self-pay | Admitting: Family Medicine

## 2021-09-28 MED ORDER — NITROFURANTOIN MONOHYD MACRO 100 MG PO CAPS: 100.0000 mg | ORAL_CAPSULE | Freq: Two times a day (BID) | ORAL | 0 refills | Status: DC

## 2021-09-28 NOTE — Addendum Note (Signed)
Addended by: Dairl Ponder on: 09/28/2021 09:41 AM   Modules accepted: Orders

## 2021-10-01 ENCOUNTER — Other Ambulatory Visit: Payer: Self-pay | Admitting: Family Medicine

## 2021-10-11 ENCOUNTER — Other Ambulatory Visit: Payer: Self-pay

## 2021-10-11 ENCOUNTER — Encounter (INDEPENDENT_AMBULATORY_CARE_PROVIDER_SITE_OTHER): Payer: Medicare PPO | Admitting: Ophthalmology

## 2021-10-11 DIAGNOSIS — H35033 Hypertensive retinopathy, bilateral: Secondary | ICD-10-CM

## 2021-10-11 DIAGNOSIS — H35341 Macular cyst, hole, or pseudohole, right eye: Secondary | ICD-10-CM

## 2021-10-11 DIAGNOSIS — I10 Essential (primary) hypertension: Secondary | ICD-10-CM | POA: Diagnosis not present

## 2021-10-11 DIAGNOSIS — H43812 Vitreous degeneration, left eye: Secondary | ICD-10-CM | POA: Diagnosis not present

## 2021-10-12 ENCOUNTER — Encounter (HOSPITAL_COMMUNITY): Payer: Self-pay

## 2021-10-12 ENCOUNTER — Ambulatory Visit (HOSPITAL_COMMUNITY)
Admission: RE | Admit: 2021-10-12 | Discharge: 2021-10-12 | Disposition: A | Discharge status: Home / Self Care | Payer: Medicare PPO | Source: Ambulatory Visit | Attending: Family Medicine | Admitting: Family Medicine

## 2021-10-12 DIAGNOSIS — R1030 Lower abdominal pain, unspecified: Secondary | ICD-10-CM | POA: Diagnosis not present

## 2021-10-12 DIAGNOSIS — R748 Abnormal levels of other serum enzymes: Secondary | ICD-10-CM | POA: Diagnosis not present

## 2021-10-12 DIAGNOSIS — R109 Unspecified abdominal pain: Secondary | ICD-10-CM | POA: Diagnosis not present

## 2021-10-12 MED ORDER — IOHEXOL 350 MG/ML SOLN
75.0000 mL | Freq: Once | INTRAVENOUS | Status: AC | PRN
  Administered 2021-10-12: 75 mL via INTRAVENOUS

## 2021-10-15 ENCOUNTER — Telehealth: Payer: Self-pay | Admitting: Family Medicine

## 2021-10-15 NOTE — Telephone Encounter (Signed)
Nurse returned call to patient. Pt had result note. Pt verbalized understanding.

## 2021-10-15 NOTE — Telephone Encounter (Signed)
Patient returned call to [facility].  CB#  [phone]

## 2021-10-25 ENCOUNTER — Ambulatory Visit (HOSPITAL_COMMUNITY): Payer: Medicare PPO

## 2021-10-26 ENCOUNTER — Other Ambulatory Visit: Payer: Self-pay | Admitting: Family Medicine

## 2021-10-26 DIAGNOSIS — F325 Major depressive disorder, single episode, in full remission: Secondary | ICD-10-CM | POA: Diagnosis not present

## 2021-10-26 DIAGNOSIS — G4700 Insomnia, unspecified: Secondary | ICD-10-CM | POA: Diagnosis not present

## 2021-10-26 DIAGNOSIS — Z8544 Personal history of malignant neoplasm of other female genital organs: Secondary | ICD-10-CM | POA: Diagnosis not present

## 2021-10-26 DIAGNOSIS — E119 Type 2 diabetes mellitus without complications: Secondary | ICD-10-CM | POA: Diagnosis not present

## 2021-10-26 DIAGNOSIS — E669 Obesity, unspecified: Secondary | ICD-10-CM | POA: Diagnosis not present

## 2021-10-26 DIAGNOSIS — E785 Hyperlipidemia, unspecified: Secondary | ICD-10-CM | POA: Diagnosis not present

## 2021-10-26 DIAGNOSIS — Z7984 Long term (current) use of oral hypoglycemic drugs: Secondary | ICD-10-CM | POA: Diagnosis not present

## 2021-10-26 DIAGNOSIS — I10 Essential (primary) hypertension: Secondary | ICD-10-CM | POA: Diagnosis not present

## 2021-10-27 ENCOUNTER — Telehealth: Payer: Self-pay | Admitting: Family Medicine

## 2021-10-27 ENCOUNTER — Encounter: Payer: Medicare PPO | Admitting: Family Medicine

## 2021-10-27 ENCOUNTER — Other Ambulatory Visit: Payer: Self-pay

## 2021-10-27 ENCOUNTER — Encounter: Payer: Self-pay | Admitting: Family Medicine

## 2021-10-27 ENCOUNTER — Ambulatory Visit (INDEPENDENT_AMBULATORY_CARE_PROVIDER_SITE_OTHER): Payer: Medicare PPO | Admitting: Family Medicine

## 2021-10-27 VITALS — BP 132/74 | Temp 97.2°F | Ht 61.25 in | Wt 174.6 lb

## 2021-10-27 DIAGNOSIS — Z0000 Encounter for general adult medical examination without abnormal findings: Secondary | ICD-10-CM

## 2021-10-27 DIAGNOSIS — Z23 Encounter for immunization: Secondary | ICD-10-CM | POA: Diagnosis not present

## 2021-10-27 DIAGNOSIS — E119 Type 2 diabetes mellitus without complications: Secondary | ICD-10-CM

## 2021-10-27 DIAGNOSIS — E782 Mixed hyperlipidemia: Secondary | ICD-10-CM

## 2021-10-27 MED ORDER — GLIPIZIDE 5 MG PO TABS: ORAL_TABLET | ORAL | 4 refills | Status: DC

## 2021-10-27 MED ORDER — LOSARTAN POTASSIUM 100 MG PO TABS: ORAL_TABLET | ORAL | 5 refills | Status: DC

## 2021-10-27 MED ORDER — SERTRALINE HCL 50 MG PO TABS: ORAL_TABLET | ORAL | 5 refills | Status: DC

## 2021-10-27 MED ORDER — AMLODIPINE BESYLATE 10 MG PO TABS: 10.0000 mg | ORAL_TABLET | Freq: Every day | ORAL | 6 refills | Status: DC

## 2021-10-27 MED ORDER — ROSUVASTATIN CALCIUM 40 MG PO TABS: 40.0000 mg | ORAL_TABLET | Freq: Every day | ORAL | 5 refills | Status: DC

## 2021-10-27 MED ORDER — ALPRAZOLAM 0.5 MG PO TABS: ORAL_TABLET | ORAL | 5 refills | Status: DC

## 2021-10-27 MED ORDER — EMPAGLIFLOZIN 25 MG PO TABS: 25.0000 mg | ORAL_TABLET | Freq: Every day | ORAL | 5 refills | Status: DC

## 2021-10-27 NOTE — Progress Notes (Signed)
   Subjective:    Patient ID: KYLAN VEACH, female    DOB: 1946-10-10, 75 y.o.   MRN: [id number]  HPI AWV- Annual Wellness Visit Also for wellness The patient comes in today for a wellness visit.    A review of their health history was completed.  A review of medications was also completed.  Any needed refills; we will refill medications  Eating habits: Tries to eat healthy for the most part  Falls/  MVA accidents in past few months: No accidents or injuries recently  Regular exercise: Stays active around the house but no focused exercise  Specialist pt sees on regular basis: Does see eye specialist  Preventative health issues were discussed.   Additional concerns: None  The patient was seen for their annual wellness visit. The patient's past medical history, surgical history, and family history were reviewed. Pertinent vaccines were reviewed ( tetanus, pneumonia, shingles, flu) The patient's medication list was reviewed and updated.  The height and weight were entered.  BMI recorded in electronic record elsewhere  Cognitive screening was completed. Outcome of Mini - Cog: pass   Falls /depression screening electronically recorded within record elsewhere  Current tobacco usage:none (All patients who use tobacco were given written and verbal information on quitting)  Recent listing of emergency department/hospitalizations over the past year were reviewed.  current specialist the patient sees on a regular basis: Dr.Matthews and Dr.Nishal; sees dentist and ophthalmology    Medicare annual wellness visit patient questionnaire was reviewed.  A written screening schedule for the patient for the next 5-10 years was given. Appropriate discussion of followup regarding next visit was discussed.   Pt had nurse from [facility] come out yesterday and complete Mini Cog   Review of Systems     Objective:   Physical Exam General-in no acute distress Eyes-no  discharge Lungs-respiratory rate normal, CTA CV-no murmurs,RRR Extremities skin warm dry no edema Neuro grossly normal Behavior normal, alert Vaginal exam is had previous hysterectomy.  No sign of any cancer.  Has a diverticula growth benign on the superior portion of the vagina same as it has been no masses are felt       Assessment & Plan:   1. Need for vaccination Flu shot today pneumonia shot today - Flu Vaccine QUAD High Dose(Fluad) - Pneumococcal conjugate vaccine 20-valent (Prevnar 20)  2. Diabetes mellitus without complication ([place]) Under good control watching diet taking medication  3. Mixed hyperlipidemia Takes a medicine regular basis no lab work indicated currently comprehensive lab work by spring time - Pneumococcal conjugate vaccine 20-valent (Prevnar 20)  4. Well adult exam Adult wellness-complete.wellness physical was conducted today. Importance of diet and exercise were discussed in detail.  In addition to this a discussion regarding safety was also covered. We also reviewed over immunizations and gave recommendations regarding current immunization n not indicated eeded for age.  In addition to this additional areas were also touched on including: Preventative health exams needed:  Colonoscopy not indicated  Patient was advised yearly wellness exam   5. Encounter for subsequent annual wellness visit (AWV) in Medicare patient We did discuss immunizations updates today Has already had shingles vaccine Safety dietary measures discussed Up-to-date on mammogram

## 2021-10-27 NOTE — Telephone Encounter (Signed)
Prescription sent electronically to pharmacy. Patient notified. 

## 2021-10-27 NOTE — Telephone Encounter (Signed)
Patient is requesting refill on glipizide 5 mg called into [facility]

## 2021-12-17 ENCOUNTER — Other Ambulatory Visit: Payer: Self-pay | Admitting: Family Medicine

## 2021-12-21 NOTE — Progress Notes (Signed)
CARDIOLOGY CONSULT NOTE       Patient ID: Christie [name] MRN: [id number] DOB/AGE: 12/28/1945 76 y.o.  Admit date: (Not on file) Referring Physician: Wolfgang Phoenix Primary Physician: Kathyrn Drown, MD Primary Cardiologist: New Reason for Consultation: Aortic valve disease    HPI:  76 y.o. referred by Dr Wolfgang Phoenix 10/27/20 for mixed aortic valve disease. I reviewed her echo from 09/18/20 and mean gradient only 12 mmHg peak 23 mmHg DVI 0.56 and AVA 1.3 cm2. Read as mild AS and mild AR with EF 60-65% Compared to echo done 11/14/16 not much change when mean gradient was 11 and peak 21 mmHg.  She has a history of HTN, DM, and hyperlipidemia on appropriate meds  Of note she had severe abdominal pain 10/12/20 and was referred to GI Abdominal CT was benign as was her CBC and Cr   She is a long standing diabetic and has some coronary calcium on CT but no history of clinical angina. When I saw her in 2017 suggested a myovue study which was never done   She is widowed last 10 years Still sad about it married 53 years Has daughter and 2 grand children in town that look in on her   TTE 09/18/20 EF 60-65% mild MR mild AS mean gradient 12 peak 24 AVA 12. Cm2 and DVI 0.56 Myovue 11/17/20 Normal no ischemia EF 66%   No cardiac complaints   ROS All other systems reviewed and negative except as noted above  Past Medical History:  Diagnosis Date   Cancer ([place]) 1999   external vulva lesion   Depression    DM (diabetes mellitus) ([place]) 2012   oral medications   Elevated liver enzymes    History of kidney stones    Hyperlipidemia    Hyperlipidemia associated with type 2 diabetes mellitus ([place]) 02/25/2020   Hypertension 2000   Spastic colon     Family History  Problem Relation Age of Onset   Diabetes Father    Hypertension Father    Dementia Father    Pancreatic cancer Mother     Social History   Socioeconomic History   Marital status: Widowed    Spouse name: Not on file   Number of children: Not  on file   Years of education: Not on file   Highest education level: Not on file  Occupational History   Not on file  Tobacco Use   Smoking status: Never   Smokeless tobacco: Never  Vaping Use   Vaping Use: Never used  Substance and Sexual Activity   Alcohol use: No    Alcohol/week: 0.0 standard drinks   Drug use: No   Sexual activity: Not on file  Other Topics Concern   Not on file  Social History Narrative   Not on file   Social Determinants of Health   Financial Resource Strain: Not on file  Food Insecurity: Not on file  Transportation Needs: Not on file  Physical Activity: Not on file  Stress: Not on file  Social Connections: Not on file  Intimate Partner Violence: Not on file    Past Surgical History:  Procedure Laterality Date   ANTERIOR LAT LUMBAR FUSION Right 07/04/2017   Procedure: Right LUMBAR TWO-THREE LUMBAR THREE-FOUR LUMBAR FOUR-FIVE Anterior lateral lumbar interbody fusion with percutaneous pedicle screws;  Surgeon: Erline Levine, MD;  Location: [facility];  Service: Neurosurgery;  Laterality: Right;  Right lateral approach    ANTERIOR LATERAL LUMBAR FUSION WITH PERCUTANEOUS SCREW 3 LEVEL N/A 07/04/2017  Procedure: PERCUTANEOUS SCREW THREE LEVEL LUMBAR TWO-THREE LUMBAR THREE-FOUR LUMBAR FOUR-FIVE;  Surgeon: Erline Levine, MD;  Location: [facility];  Service: Neurosurgery;  Laterality: N/A;  Prone   BACK SURGERY  2004 and 2017   BIOPSY  02/23/2021   Procedure: BIOPSY;  Surgeon: Harvel Quale, MD;  [facility];  Service: Gastroenterology;;   CHOLECYSTECTOMY     COLONOSCOPY  feb 2005   COLONOSCOPY N/A 02/13/2014   Procedure: COLONOSCOPY;  Surgeon: Rogene Houston, MD;  [facility];  Service: Endoscopy;  Laterality: N/A;  930-moved to 100 Ann notified pt   COLONOSCOPY WITH PROPOFOL N/A 02/23/2021   Procedure: COLONOSCOPY WITH PROPOFOL;  Surgeon: Harvel Quale, MD;  [facility];  Service: Gastroenterology;  Laterality:  N/A;  AM   ESOPHAGOGASTRODUODENOSCOPY  09/2005   EYE SURGERY Right    catatract removed   EYE SURGERY Right    macular   surgery    KIDNEY STONE SURGERY     for a staghorn kidney stone   POLYPECTOMY  02/23/2021   Procedure: POLYPECTOMY;  Surgeon: Montez Morita, Quillian Quince, MD;  [facility];  Service: Gastroenterology;;   STAPEDECTOMY Bilateral    Right 1991 and Left 1994   TOTAL ABDOMINAL HYSTERECTOMY  1982   vulva cancer surgery  1999      Current Outpatient Medications:    ACCU-CHEK GUIDE test strip, USE TO CHECK BLOOD SUGAR ONCE DAILY AS DIRECTED, Disp: 50 strip, Rfl: 0   acetaminophen (TYLENOL) 500 MG tablet, Take 1,000 mg by mouth every 6 (six) hours as needed (pain)., Disp: , Rfl:    ALPRAZolam (XANAX) 0.5 MG tablet, TAKE 1 TABLET BY MOUTH AT BEDTIME., Disp: 30 tablet, Rfl: 4   amLODipine (NORVASC) 10 MG tablet, Take 1 tablet (10 mg total) by mouth daily., Disp: 30 tablet, Rfl: 6   Ascorbic Acid (VITAMIN C) 1000 MG tablet, Take 1,000 mg by mouth daily., Disp: , Rfl:    cholecalciferol (VITAMIN D) 1000 UNITS tablet, Take 1,000 Units by mouth daily., Disp: , Rfl:    docusate sodium (COLACE) 100 MG capsule, Take 300 mg by mouth at bedtime., Disp: , Rfl:    empagliflozin (JARDIANCE) 25 MG TABS tablet, Take 1 tablet (25 mg total) by mouth daily., Disp: 30 tablet, Rfl: 5   glipiZIDE (GLUCOTROL) 5 MG tablet, TAKE 2 TABLETS IN THE MORNING AND 2 1/2 TABLETS IN THE EVENING, WITH MEALS, Disp: 135 tablet, Rfl: 4   losartan (COZAAR) 100 MG tablet, 1 qd, Disp: 30 tablet, Rfl: 5   Multiple Vitamins-Minerals (MACULAR VITAMIN BENEFIT PO), Take 1 tablet by mouth in the morning and at bedtime., Disp: , Rfl:    OVER THE COUNTER MEDICATION, IB Guard BID, Disp: , Rfl:    rosuvastatin (CRESTOR) 40 MG tablet, Take 1 tablet (40 mg total) by mouth daily., Disp: 30 tablet, Rfl: 5   sertraline (ZOLOFT) 50 MG tablet, 1 qd, Disp: 30 tablet, Rfl: 5   zinc gluconate 50 MG tablet, Take 50 mg by mouth  daily., Disp: , Rfl:     Physical Exam: Blood pressure (!) 124/54, pulse 77, height 5' 3.5" (1.613 m), weight 176 lb (79.8 kg).    Affect appropriate Elderly female  HEENT: normal Neck supple with no adenopathy JVP normal no bruits no thyromegaly Lungs clear with no wheezing and good diaphragmatic motion Heart:  S1/S2 preserved AS  murmur, no rub, gallop or click PMI normal Abdomen: benighn, BS positve, no tenderness, no AAA  no bruit.  No HSM or HJR Distal pulses intact with no bruits No edema Neuro non-focal Skin warm and dry No muscular weakness   Labs:   Lab Results  Component Value Date   WBC 7.8 09/20/2021   HGB 15.2 09/20/2021   HCT 46.7 (H) 09/20/2021   MCV 92 09/20/2021   PLT 158 09/20/2021   No results for input(s): NA, K, CL, CO2, BUN, CREATININE, CALCIUM, PROT, BILITOT, ALKPHOS, ALT, AST, GLUCOSE in the last 168 hours.  Invalid input(s): LABALBU No results found for: CKTOTAL, CKMB, CKMBINDEX, TROPONINI  Lab Results  Component Value Date   CHOL 151 09/20/2021   CHOL 160 06/01/2021   CHOL 162 04/30/2020   Lab Results  Component Value Date   HDL 70 09/20/2021   HDL 70 06/01/2021   HDL 67 04/30/2020   Lab Results  Component Value Date   LDLCALC 62 09/20/2021   LDLCALC 71 06/01/2021   LDLCALC 75 04/30/2020   Lab Results  Component Value Date   TRIG 107 09/20/2021   TRIG 106 06/01/2021   TRIG 113 04/30/2020   Lab Results  Component Value Date   CHOLHDL 2.2 09/20/2021   CHOLHDL 2.3 06/01/2021   CHOLHDL 2.4 04/30/2020   No results found for: LDLDIRECT    Radiology: No results found.   EKG: NSR rate 74 normal ECG 01/03/2022 NSR rate 77 normal    ASSESSMENT AND PLAN:   1. AVD:  Mild AS/AR normal EF not much change since 2017 f/u echo October 2023   2. HTN:  Well controlled.  Continue current medications and low sodium Dash type diet.   3. DM:  Discussed low carb diet.  Target hemoglobin A1c is 6.5 or less.  Continue current  medications. 4. HLD:  Continue statin labs with primary 5. GI:  Resolved pain no pathology on CT 6. CAD:  Risk with long standing DM and age normal myovue 11/17/21   F/U in a year with echo for AS/AR     Signed: Jenkins Rouge 01/03/2022, 10:04 AM

## 2022-01-03 ENCOUNTER — Encounter: Payer: Self-pay | Admitting: Cardiovascular Disease

## 2022-01-03 ENCOUNTER — Ambulatory Visit: Payer: Medicare PPO | Admitting: Cardiovascular Disease

## 2022-01-03 VITALS — BP 124/54 | HR 77 | Ht 63.5 in | Wt 176.0 lb

## 2022-01-03 DIAGNOSIS — I10 Essential (primary) hypertension: Secondary | ICD-10-CM

## 2022-01-03 DIAGNOSIS — I350 Nonrheumatic aortic (valve) stenosis: Secondary | ICD-10-CM | POA: Diagnosis not present

## 2022-01-03 DIAGNOSIS — E782 Mixed hyperlipidemia: Secondary | ICD-10-CM | POA: Diagnosis not present

## 2022-01-03 NOTE — Patient Instructions (Signed)
Medication Instructions:  Your physician recommends that you continue on your current medications as directed. Please refer to the Current Medication list given to you today.  *If you need a refill on your cardiac medications before your next appointment, please call your pharmacy*  Lab Work: If you have labs (blood work) drawn today and your tests are completely normal, you will receive your results only by: [place] (if you have MyChart) OR A paper copy in the mail If you have any lab test that is abnormal or we need to change your treatment, we will call you to review the results.  Testing/Procedures: Your physician has requested that you have an echocardiogram in October. Echocardiography is a painless test that uses sound waves to create images of your heart. It provides your doctor with information about the size and shape of your heart and how well your hearts chambers and valves are working. This procedure takes approximately one hour. There are no restrictions for this procedure.  Follow-Up: At [facility] you and your health needs are our priority.  As part of our continuing mission to provide you with exceptional heart care, we have created designated Provider Care Teams.  These Care Teams include your primary Cardiologist (physician) and Advanced Practice Providers (APPs -  Physician Assistants and Nurse Practitioners) who all work together to provide you with the care you need, when you need it.  We recommend signing up for the patient portal called "MyChart".  Sign up information is provided on this After Visit Summary.  MyChart is used to connect with patients for Virtual Visits (Telemedicine).  Patients are able to view lab/test results, encounter notes, upcoming appointments, etc.  Non-urgent messages can be sent to your provider as well.   To learn more about what you can do with MyChart, go to [email or web address].    Your next appointment:   October  The  format for your next appointment:   In Person  Provider:   Dr. Johnsie Cancel

## 2022-01-17 ENCOUNTER — Other Ambulatory Visit: Payer: Self-pay | Admitting: Family Medicine

## 2022-01-25 DIAGNOSIS — Z1231 Encounter for screening mammogram for malignant neoplasm of breast: Secondary | ICD-10-CM | POA: Diagnosis not present

## 2022-01-25 LAB — HM MAMMOGRAPHY: HM Mammogram: NORMAL (ref 0–4)

## 2022-02-01 ENCOUNTER — Encounter: Payer: Self-pay | Admitting: Family Medicine

## 2022-02-22 ENCOUNTER — Other Ambulatory Visit: Payer: Self-pay | Admitting: Family Medicine

## 2022-03-14 DIAGNOSIS — E785 Hyperlipidemia, unspecified: Secondary | ICD-10-CM | POA: Diagnosis not present

## 2022-03-14 DIAGNOSIS — E782 Mixed hyperlipidemia: Secondary | ICD-10-CM | POA: Diagnosis not present

## 2022-03-14 DIAGNOSIS — E119 Type 2 diabetes mellitus without complications: Secondary | ICD-10-CM | POA: Diagnosis not present

## 2022-03-14 DIAGNOSIS — E1169 Type 2 diabetes mellitus with other specified complication: Secondary | ICD-10-CM | POA: Diagnosis not present

## 2022-03-14 DIAGNOSIS — Z79899 Other long term (current) drug therapy: Secondary | ICD-10-CM | POA: Diagnosis not present

## 2022-03-14 DIAGNOSIS — I10 Essential (primary) hypertension: Secondary | ICD-10-CM | POA: Diagnosis not present

## 2022-03-16 LAB — BASIC METABOLIC PANEL
BUN/Creatinine Ratio: 22 (ref 12–28)
BUN: 12 mg/dL (ref 8–27)
CO2: 24 mmol/L (ref 20–29)
Calcium: 9.7 mg/dL (ref 8.7–10.3)
Chloride: 105 mmol/L (ref 96–106)
Creatinine, Ser: 0.55 mg/dL — ABNORMAL LOW (ref 0.57–1.00)
Glucose: 119 mg/dL — ABNORMAL HIGH (ref 70–99)
Potassium: 4.2 mmol/L (ref 3.5–5.2)
Sodium: 142 mmol/L (ref 134–144)
eGFR: 94 mL/min/{1.73_m2} (ref >59)

## 2022-03-16 LAB — HEPATIC FUNCTION PANEL
ALT: 30 IU/L (ref 0–32)
AST: 25 IU/L (ref 0–40)
Albumin: 4.6 g/dL (ref 3.7–4.7)
Alkaline Phosphatase: 136 IU/L — ABNORMAL HIGH (ref 44–121)
Bilirubin Total: 0.5 mg/dL (ref 0.0–1.2)
Bilirubin, Direct: 0.15 mg/dL (ref 0.00–0.40)
Total Protein: 6.2 g/dL (ref 6.0–8.5)

## 2022-03-16 LAB — LIPID PANEL
Chol/HDL Ratio: 2 ratio (ref 0.0–4.4)
Cholesterol, Total: 149 mg/dL (ref 100–199)
HDL: 73 mg/dL (ref >39)
LDL Chol Calc (NIH): 57 mg/dL (ref 0–99)
Triglycerides: 106 mg/dL (ref 0–149)
VLDL Cholesterol Cal: 19 mg/dL (ref 5–40)

## 2022-03-16 LAB — HEMOGLOBIN A1C
Est. average glucose Bld gHb Est-mCnc: 160 mg/dL
Hgb A1c MFr Bld: 7.2 % — ABNORMAL HIGH (ref 4.8–5.6)

## 2022-03-16 LAB — MICROALBUMIN / CREATININE URINE RATIO
Creatinine, Urine: 50.5 mg/dL
Microalb/Creat Ratio: 15 mg/g creat (ref 0–29)
Microalbumin, Urine: 7.8 ug/mL

## 2022-03-22 ENCOUNTER — Telehealth: Payer: Self-pay

## 2022-03-22 NOTE — Telephone Encounter (Signed)
Please advise. Thank you

## 2022-03-22 NOTE — Telephone Encounter (Signed)
Caller name:Latanya Maves  ? ?On DPR? :Yes ? ?Call back [phone] ? ?Provider they see:Dr Nicki Reaper  ? ?Reason for call:Pt dropped off Disability Parking form needs to be signed placed in Dr Bary Leriche folder  ? ?

## 2022-03-23 NOTE — Telephone Encounter (Signed)
Form was filled out please finish filling in the administrative portion thank you the patient is requesting this be mailed to them thank you ?

## 2022-03-30 DIAGNOSIS — F325 Major depressive disorder, single episode, in full remission: Secondary | ICD-10-CM | POA: Diagnosis not present

## 2022-03-30 DIAGNOSIS — I2510 Atherosclerotic heart disease of native coronary artery without angina pectoris: Secondary | ICD-10-CM | POA: Diagnosis not present

## 2022-03-30 DIAGNOSIS — I10 Essential (primary) hypertension: Secondary | ICD-10-CM | POA: Diagnosis not present

## 2022-03-30 DIAGNOSIS — E669 Obesity, unspecified: Secondary | ICD-10-CM | POA: Diagnosis not present

## 2022-03-30 DIAGNOSIS — E1165 Type 2 diabetes mellitus with hyperglycemia: Secondary | ICD-10-CM | POA: Diagnosis not present

## 2022-03-30 DIAGNOSIS — F419 Anxiety disorder, unspecified: Secondary | ICD-10-CM | POA: Diagnosis not present

## 2022-03-30 DIAGNOSIS — E785 Hyperlipidemia, unspecified: Secondary | ICD-10-CM | POA: Diagnosis not present

## 2022-03-30 DIAGNOSIS — M1990 Unspecified osteoarthritis, unspecified site: Secondary | ICD-10-CM | POA: Diagnosis not present

## 2022-03-30 DIAGNOSIS — K5900 Constipation, unspecified: Secondary | ICD-10-CM | POA: Diagnosis not present

## 2022-04-04 ENCOUNTER — Other Ambulatory Visit: Payer: Self-pay | Admitting: Family Medicine

## 2022-04-22 ENCOUNTER — Other Ambulatory Visit: Payer: Self-pay | Admitting: Family Medicine

## 2022-04-28 ENCOUNTER — Other Ambulatory Visit: Payer: Self-pay | Admitting: Family Medicine

## 2022-04-29 NOTE — Telephone Encounter (Signed)
May have 30-day with 1 additional refill thank you ?

## 2022-04-29 NOTE — Telephone Encounter (Signed)
May have 3 months on all medicine please follow-up as planned ?

## 2022-05-09 DIAGNOSIS — H35033 Hypertensive retinopathy, bilateral: Secondary | ICD-10-CM | POA: Diagnosis not present

## 2022-05-09 LAB — HM DIABETES EYE EXAM

## 2022-05-23 ENCOUNTER — Other Ambulatory Visit: Payer: Self-pay | Admitting: Family Medicine

## 2022-05-24 ENCOUNTER — Encounter: Payer: Self-pay | Admitting: Family Medicine

## 2022-05-24 ENCOUNTER — Ambulatory Visit: Payer: Medicare PPO | Admitting: Family Medicine

## 2022-05-24 VITALS — BP 138/60 | HR 77 | Temp 98.1°F | Ht 63.5 in | Wt 173.0 lb

## 2022-05-24 DIAGNOSIS — I10 Essential (primary) hypertension: Secondary | ICD-10-CM

## 2022-05-24 DIAGNOSIS — E782 Mixed hyperlipidemia: Secondary | ICD-10-CM

## 2022-05-24 DIAGNOSIS — J019 Acute sinusitis, unspecified: Secondary | ICD-10-CM | POA: Diagnosis not present

## 2022-05-24 DIAGNOSIS — E785 Hyperlipidemia, unspecified: Secondary | ICD-10-CM

## 2022-05-24 DIAGNOSIS — E1169 Type 2 diabetes mellitus with other specified complication: Secondary | ICD-10-CM

## 2022-05-24 DIAGNOSIS — G4709 Other insomnia: Secondary | ICD-10-CM

## 2022-05-24 MED ORDER — EMPAGLIFLOZIN 25 MG PO TABS
25.0000 mg | ORAL_TABLET | Freq: Every day | ORAL | 1 refills | Status: DC
Start: 2022-05-24 — End: 2022-11-16

## 2022-05-24 MED ORDER — AMOXICILLIN 500 MG PO CAPS: 500.0000 mg | ORAL_CAPSULE | Freq: Three times a day (TID) | ORAL | 0 refills | Status: AC

## 2022-05-24 MED ORDER — LOSARTAN POTASSIUM 100 MG PO TABS: ORAL_TABLET | ORAL | 1 refills | Status: DC

## 2022-05-24 MED ORDER — ALPRAZOLAM 0.5 MG PO TABS
ORAL_TABLET | ORAL | 4 refills | Status: DC
Start: 2022-05-24 — End: 2022-11-14

## 2022-05-24 MED ORDER — AMLODIPINE BESYLATE 10 MG PO TABS: 10.0000 mg | ORAL_TABLET | Freq: Every day | ORAL | 1 refills | Status: DC

## 2022-05-24 MED ORDER — ROSUVASTATIN CALCIUM 40 MG PO TABS: 40.0000 mg | ORAL_TABLET | Freq: Every day | ORAL | 5 refills | Status: DC

## 2022-05-24 MED ORDER — SERTRALINE HCL 50 MG PO TABS
ORAL_TABLET | ORAL | 1 refills | Status: DC
Start: 2022-05-24 — End: 2022-11-16

## 2022-05-24 NOTE — Progress Notes (Signed)
   Subjective:    Patient ID: Christie [name], female    DOB: 08/04/1946, 76 y.o.   MRN: [id number]  Cough The current episode started in the past 7 days. The cough is Non-productive. Associated symptoms include rhinorrhea.  Relates a lot of head congestion drainage coughing sinus drainage mucoid drainage started off with clear runny nose coughing now progressed to becoming worse States diabetes doing okay denies low sugars trying to watch diet Takes his Xanax in the evening time to help with rest Currently taking sertraline to help with her moods denies being depressed but does desire to stay on sertraline Review of Systems  HENT:  Positive for rhinorrhea.   Respiratory:  Positive for cough.       Objective:   Physical Exam  Gen-NAD not toxic TMS-normal bilateral T- normal no redness Chest-CTA respiratory rate normal no crackles CV RRR no murmur Skin-warm dry Neuro-grossly normal       Assessment & Plan:  1. Essential hypertension, benign Blood pressure decent control continue current measures  2. Mixed hyperlipidemia Labs reviewed from spring time continue current measures  3. Hyperlipidemia associated with type 2 diabetes mellitus ([place]) Continue current measures diabetes fair control work hard on diet recheck labs in the fall  4. Other insomnia Continue Xanax sparingly caution drowsiness try to taper down as she gets older  5. Acute rhinosinusitis Antibiotics prescribed probably started off as a virus

## 2022-06-06 ENCOUNTER — Ambulatory Visit: Payer: Medicare PPO | Admitting: Dermatology

## 2022-06-08 ENCOUNTER — Ambulatory Visit: Payer: Medicare PPO | Admitting: Family Medicine

## 2022-06-20 ENCOUNTER — Other Ambulatory Visit: Payer: Self-pay | Admitting: Family Medicine

## 2022-07-11 ENCOUNTER — Ambulatory Visit: Payer: Medicare PPO | Admitting: Family Medicine

## 2022-07-11 ENCOUNTER — Other Ambulatory Visit: Payer: Self-pay | Admitting: Family Medicine

## 2022-07-11 VITALS — BP 134/64 | HR 77 | Temp 97.7°F | Wt 172.0 lb

## 2022-07-11 DIAGNOSIS — R1030 Lower abdominal pain, unspecified: Secondary | ICD-10-CM | POA: Diagnosis not present

## 2022-07-11 DIAGNOSIS — R195 Other fecal abnormalities: Secondary | ICD-10-CM

## 2022-07-11 DIAGNOSIS — G4762 Sleep related leg cramps: Secondary | ICD-10-CM | POA: Diagnosis not present

## 2022-07-11 DIAGNOSIS — M8580 Other specified disorders of bone density and structure, unspecified site: Secondary | ICD-10-CM | POA: Diagnosis not present

## 2022-07-11 MED ORDER — DICYCLOMINE HCL 10 MG PO CAPS
ORAL_CAPSULE | ORAL | 2 refills | Status: DC
Start: 2022-07-11 — End: 2024-06-14

## 2022-07-11 NOTE — Progress Notes (Signed)
   Subjective:    Patient ID: Christie [name], female    DOB: September 20, 1946, 76 y.o.   MRN: [id number]  HPI Abdominal discomfort with nausea x  7 days , happens all throughtout the day , usually after meals and lingers Loose stools 2 to 3 x per day , dizzy at times  Present for the past week No mucous 2 or 3 per dayof loose stool Nl diet Nausea in the morning Eating about 25 % less No travel No illness exposure No fvers Does not wake her Having soreness ongoing    Review of Systems     Objective:   Physical Exam  General-in no acute distress Eyes-no discharge Lungs-respiratory rate normal, CTA CV-no murmurs,RRR Extremities skin warm dry no edema Neuro grossly normal Behavior normal, alert  Lower abdomen mild tenderness no guarding or rebound     Assessment & Plan:  1. Lower abdominal pain Lab work ordered.  Patient had colonoscopy previously Recommend Bentyl 3 times daily as needed FODMAP diet If bloody or mucousy stools to let us know Hold off on any imaging or colonoscopy currently Do not feel that this is a sign of cancer More than likely colitis type issue - Magnesium - CBC with Differential/Platelet - Comprehensive metabolic panel - Lipase - Celiac Disease Antibody Screen  2. Loose stools See discussion above - Magnesium - CBC with Differential/Platelet - Comprehensive metabolic panel - Lipase - Celiac Disease Antibody Screen  3. Osteopenia, unspecified location Bone density ordered - DG Bone Density  Patient has comprehensive follow-up with [place] in the fall  Also may need referral back to gastroenterology/Dr. Jenetta Downer if ongoing troubles

## 2022-07-13 LAB — COMPREHENSIVE METABOLIC PANEL
ALT: 24 IU/L (ref 0–32)
AST: 21 IU/L (ref 0–40)
Albumin/Globulin Ratio: 2 (ref 1.2–2.2)
Albumin: 4.7 g/dL (ref 3.8–4.8)
Alkaline Phosphatase: 131 IU/L — ABNORMAL HIGH (ref 44–121)
BUN/Creatinine Ratio: 29 — ABNORMAL HIGH (ref 12–28)
BUN: 16 mg/dL (ref 8–27)
Bilirubin Total: 0.4 mg/dL (ref 0.0–1.2)
CO2: 22 mmol/L (ref 20–29)
Calcium: 9.8 mg/dL (ref 8.7–10.3)
Chloride: 104 mmol/L (ref 96–106)
Creatinine, Ser: 0.56 mg/dL — ABNORMAL LOW (ref 0.57–1.00)
Globulin, Total: 2.4 g/dL (ref 1.5–4.5)
Glucose: 149 mg/dL — ABNORMAL HIGH (ref 70–99)
Potassium: 4.3 mmol/L (ref 3.5–5.2)
Sodium: 144 mmol/L (ref 134–144)
Total Protein: 7.1 g/dL (ref 6.0–8.5)
eGFR: 93 mL/min/{1.73_m2} (ref >59)

## 2022-07-13 LAB — CBC WITH DIFFERENTIAL/PLATELET
Basophils Absolute: 0.1 10*3/uL (ref 0.0–0.2)
Basos: 1 %
EOS (ABSOLUTE): 0.1 10*3/uL (ref 0.0–0.4)
Eos: 1 %
Hematocrit: 47.9 % — ABNORMAL HIGH (ref 34.0–46.6)
Hemoglobin: 15.6 g/dL (ref 11.1–15.9)
Immature Grans (Abs): 0 10*3/uL (ref 0.0–0.1)
Immature Granulocytes: 0 %
Lymphocytes Absolute: 2.1 10*3/uL (ref 0.7–3.1)
Lymphs: 22 %
MCH: 29.7 pg (ref 26.6–33.0)
MCHC: 32.6 g/dL (ref 31.5–35.7)
MCV: 91 fL (ref 79–97)
Monocytes Absolute: 0.6 10*3/uL (ref 0.1–0.9)
Monocytes: 6 %
Neutrophils Absolute: 6.8 10*3/uL (ref 1.4–7.0)
Neutrophils: 70 %
Platelets: 163 10*3/uL (ref 150–450)
RBC: 5.26 x10E6/uL (ref 3.77–5.28)
RDW: 13.4 % (ref 11.7–15.4)
WBC: 9.7 10*3/uL (ref 3.4–10.8)

## 2022-07-13 LAB — CELIAC DISEASE ANTIBODY SCREEN
Antigliadin Abs, IgA: 6 units (ref 0–19)
IgA/Immunoglobulin A, Serum: 229 mg/dL (ref 64–422)
Transglutaminase IgA: <2 U/mL (ref 0–3)

## 2022-07-13 LAB — LIPASE: Lipase: 43 U/L (ref 14–85)

## 2022-07-13 LAB — MAGNESIUM: Magnesium: 2.1 mg/dL (ref 1.6–2.3)

## 2022-07-15 ENCOUNTER — Telehealth: Payer: Self-pay | Admitting: *Deleted

## 2022-07-15 ENCOUNTER — Encounter: Payer: Self-pay | Admitting: Family Medicine

## 2022-07-15 NOTE — Telephone Encounter (Signed)
Patient called and would like a call from Dr Nicki Reaper regarding her lab results. Patient states she saw the results on my chart and would like to speak with him

## 2022-07-17 NOTE — Telephone Encounter (Signed)
I did have a phone conversation with her regarding her results.

## 2022-07-20 ENCOUNTER — Other Ambulatory Visit: Payer: Self-pay | Admitting: Family Medicine

## 2022-07-22 ENCOUNTER — Ambulatory Visit (HOSPITAL_COMMUNITY)
Admission: RE | Admit: 2022-07-22 | Discharge: 2022-07-22 | Disposition: A | Discharge status: Home / Self Care | Payer: Medicare PPO | Source: Ambulatory Visit | Attending: Family Medicine | Admitting: Family Medicine

## 2022-07-22 DIAGNOSIS — Z13820 Encounter for screening for osteoporosis: Secondary | ICD-10-CM | POA: Insufficient documentation

## 2022-07-22 DIAGNOSIS — Z780 Asymptomatic menopausal state: Secondary | ICD-10-CM | POA: Insufficient documentation

## 2022-07-22 DIAGNOSIS — M810 Age-related osteoporosis without current pathological fracture: Secondary | ICD-10-CM | POA: Diagnosis not present

## 2022-07-22 DIAGNOSIS — M85852 Other specified disorders of bone density and structure, left thigh: Secondary | ICD-10-CM | POA: Diagnosis not present

## 2022-07-22 DIAGNOSIS — M8580 Other specified disorders of bone density and structure, unspecified site: Secondary | ICD-10-CM | POA: Insufficient documentation

## 2022-08-05 ENCOUNTER — Ambulatory Visit (HOSPITAL_COMMUNITY)
Admission: RE | Admit: 2022-08-05 | Discharge: 2022-08-05 | Disposition: A | Discharge status: Home / Self Care | Payer: Medicare PPO | Source: Ambulatory Visit | Attending: Family Medicine | Admitting: Family Medicine

## 2022-08-05 ENCOUNTER — Encounter: Payer: Self-pay | Admitting: Family Medicine

## 2022-08-05 ENCOUNTER — Ambulatory Visit (INDEPENDENT_AMBULATORY_CARE_PROVIDER_SITE_OTHER): Payer: Medicare PPO | Admitting: Family Medicine

## 2022-08-05 VITALS — BP 128/78 | HR 84 | Wt 172.4 lb

## 2022-08-05 DIAGNOSIS — S90221A Contusion of right lesser toe(s) with damage to nail, initial encounter: Secondary | ICD-10-CM | POA: Insufficient documentation

## 2022-08-05 DIAGNOSIS — S92534A Nondisplaced fracture of distal phalanx of right lesser toe(s), initial encounter for closed fracture: Secondary | ICD-10-CM | POA: Diagnosis not present

## 2022-08-05 DIAGNOSIS — L03031 Cellulitis of right toe: Secondary | ICD-10-CM | POA: Insufficient documentation

## 2022-08-05 MED ORDER — CEPHALEXIN 500 MG PO CAPS: 500.0000 mg | ORAL_CAPSULE | Freq: Three times a day (TID) | ORAL | 0 refills | Status: DC

## 2022-08-05 NOTE — Progress Notes (Unsigned)
   Subjective:    Patient ID: ARAH ARO, female    DOB: 1946-09-07, 76 y.o.   MRN: [id number]  HPI Pt arrives due to bruised right toe (hallux toe). Pt states she stubbed her toe on Wednesday morning; had pedicure on Wednesday afternoon. Pt is diabetic. Tender to touch.   And bruised up more the second day did not bruises much the first day no other particular findings   Review of Systems     Objective:   Physical Exam Ankle foot nontender second toe bruised red tender       Assessment & Plan:  No injury Bruising with some redness May have some level of cellulitis Patient diabetic Will cover for infection In addition to this healthy diet X-ray ordered Blood pressure on recheck good

## 2022-08-19 ENCOUNTER — Other Ambulatory Visit: Payer: Self-pay | Admitting: Family Medicine

## 2022-09-02 ENCOUNTER — Encounter: Payer: Self-pay | Admitting: Family Medicine

## 2022-09-02 ENCOUNTER — Ambulatory Visit: Payer: Medicare PPO | Admitting: Family Medicine

## 2022-09-02 VITALS — BP 134/78 | Wt 173.4 lb

## 2022-09-02 DIAGNOSIS — M816 Localized osteoporosis [Lequesne]: Secondary | ICD-10-CM

## 2022-09-02 MED ORDER — ALENDRONATE SODIUM 70 MG PO TABS: 70.0000 mg | ORAL_TABLET | ORAL | 11 refills | Status: DC

## 2022-09-02 NOTE — Patient Instructions (Signed)

## 2022-09-02 NOTE — Progress Notes (Unsigned)
   Subjective:    Patient ID: Christie [name], female    DOB: 1946/02/18, 76 y.o.   MRN: [id number]  HPI Pt arrives to discuss recent bone density. Pt had bone density on 07/22/22. Pt is taking Centrum Silver and Vit D.  Significant discussion held with the patient regarding osteoporosis.  We discussed the etiology, diagnosis, test results, medication choices, first-line and second line third line therapy, risk and benefits,  Review of Systems     Objective:   Physical Exam Lungs clear heart regular pulse normal BP good   Greater 30 minutes spent discussing all the different parameters    Assessment & Plan:  Osteoporosis Time spent with patient discussing risk factors, diagnosis, treatment, treatment options, listening to the patient to answer her questions as well Fosamax once weekly Bone density every 2 years If heartburn issues switch to IV medicine or Prolia

## 2022-09-19 ENCOUNTER — Other Ambulatory Visit: Payer: Self-pay | Admitting: Family Medicine

## 2022-09-19 DIAGNOSIS — E119 Type 2 diabetes mellitus without complications: Secondary | ICD-10-CM

## 2022-09-26 ENCOUNTER — Other Ambulatory Visit (HOSPITAL_COMMUNITY): Payer: Medicare PPO

## 2022-09-27 ENCOUNTER — Telehealth: Payer: Self-pay | Admitting: Cardiovascular Disease

## 2022-09-27 NOTE — Telephone Encounter (Signed)
Echo department told patient that she did not need echo again until January. Patient usually gets her office visit's and echo same day. Changed patient's appointment to January  after her echo.

## 2022-09-27 NOTE — Telephone Encounter (Signed)
Patient want to know if her appt for tomorrow 10/11 is still necessary since her echo got pushed back until January.  Please advise.

## 2022-09-28 ENCOUNTER — Ambulatory Visit: Payer: Medicare PPO | Admitting: Cardiovascular Disease

## 2022-10-10 ENCOUNTER — Telehealth: Payer: Self-pay

## 2022-10-10 ENCOUNTER — Other Ambulatory Visit: Payer: Self-pay

## 2022-10-10 DIAGNOSIS — E119 Type 2 diabetes mellitus without complications: Secondary | ICD-10-CM

## 2022-10-10 DIAGNOSIS — I10 Essential (primary) hypertension: Secondary | ICD-10-CM

## 2022-10-10 DIAGNOSIS — Z79899 Other long term (current) drug therapy: Secondary | ICD-10-CM

## 2022-10-10 DIAGNOSIS — E1169 Type 2 diabetes mellitus with other specified complication: Secondary | ICD-10-CM

## 2022-10-10 NOTE — Telephone Encounter (Signed)
Caller name:Losi Macwilliams   On DPR? :Yes  Call back number:[phone]  Provider they see: [name]   Reason for call:Pt is calling wanting blood work ordered before her appt

## 2022-10-10 NOTE — Telephone Encounter (Signed)
[facility] labs 7/23: Celiac Antibodies, Lipase, CMP, CBC, Mg

## 2022-10-10 NOTE — Telephone Encounter (Signed)
Lipid, liver, metabolic 7, urine ACR, X7L Diabetes hyperlipidemia hypertension

## 2022-10-10 NOTE — Telephone Encounter (Signed)
Mychart message sent per lab orders.

## 2022-10-11 ENCOUNTER — Encounter (INDEPENDENT_AMBULATORY_CARE_PROVIDER_SITE_OTHER): Payer: Medicare PPO | Admitting: Ophthalmology

## 2022-10-11 DIAGNOSIS — H35033 Hypertensive retinopathy, bilateral: Secondary | ICD-10-CM

## 2022-10-11 DIAGNOSIS — H43812 Vitreous degeneration, left eye: Secondary | ICD-10-CM | POA: Diagnosis not present

## 2022-10-11 DIAGNOSIS — H35341 Macular cyst, hole, or pseudohole, right eye: Secondary | ICD-10-CM

## 2022-10-11 DIAGNOSIS — I10 Essential (primary) hypertension: Secondary | ICD-10-CM | POA: Diagnosis not present

## 2022-10-12 ENCOUNTER — Ambulatory Visit (INDEPENDENT_AMBULATORY_CARE_PROVIDER_SITE_OTHER): Payer: Medicare PPO | Admitting: *Deleted

## 2022-10-12 DIAGNOSIS — Z23 Encounter for immunization: Secondary | ICD-10-CM

## 2022-10-13 DIAGNOSIS — I10 Essential (primary) hypertension: Secondary | ICD-10-CM | POA: Diagnosis not present

## 2022-10-13 DIAGNOSIS — E119 Type 2 diabetes mellitus without complications: Secondary | ICD-10-CM | POA: Diagnosis not present

## 2022-10-13 DIAGNOSIS — E785 Hyperlipidemia, unspecified: Secondary | ICD-10-CM | POA: Diagnosis not present

## 2022-10-13 DIAGNOSIS — Z79899 Other long term (current) drug therapy: Secondary | ICD-10-CM | POA: Diagnosis not present

## 2022-10-13 DIAGNOSIS — E1169 Type 2 diabetes mellitus with other specified complication: Secondary | ICD-10-CM | POA: Diagnosis not present

## 2022-10-15 LAB — BASIC METABOLIC PANEL
BUN/Creatinine Ratio: 23 (ref 12–28)
BUN: 12 mg/dL (ref 8–27)
CO2: 22 mmol/L (ref 20–29)
Calcium: 9.3 mg/dL (ref 8.7–10.3)
Chloride: 105 mmol/L (ref 96–106)
Creatinine, Ser: 0.53 mg/dL — ABNORMAL LOW (ref 0.57–1.00)
Glucose: 120 mg/dL — ABNORMAL HIGH (ref 70–99)
Potassium: 4.1 mmol/L (ref 3.5–5.2)
Sodium: 143 mmol/L (ref 134–144)
eGFR: 95 mL/min/{1.73_m2} (ref >59)

## 2022-10-15 LAB — LIPID PANEL
Chol/HDL Ratio: 2.1 ratio (ref 0.0–4.4)
Cholesterol, Total: 152 mg/dL (ref 100–199)
HDL: 72 mg/dL (ref >39)
LDL Chol Calc (NIH): 60 mg/dL (ref 0–99)
Triglycerides: 115 mg/dL (ref 0–149)
VLDL Cholesterol Cal: 20 mg/dL (ref 5–40)

## 2022-10-15 LAB — MICROALBUMIN / CREATININE URINE RATIO
Creatinine, Urine: 95.9 mg/dL
Microalb/Creat Ratio: 88 mg/g creat — ABNORMAL HIGH (ref 0–29)
Microalbumin, Urine: 84.6 ug/mL

## 2022-10-15 LAB — HEMOGLOBIN A1C
Est. average glucose Bld gHb Est-mCnc: 171 mg/dL
Hgb A1c MFr Bld: 7.6 % — ABNORMAL HIGH (ref 4.8–5.6)

## 2022-10-15 LAB — HEPATIC FUNCTION PANEL
ALT: 28 IU/L (ref 0–32)
AST: 26 IU/L (ref 0–40)
Albumin: 4.5 g/dL (ref 3.8–4.8)
Alkaline Phosphatase: 126 IU/L — ABNORMAL HIGH (ref 44–121)
Bilirubin Total: 0.5 mg/dL (ref 0.0–1.2)
Bilirubin, Direct: 0.22 mg/dL (ref 0.00–0.40)
Total Protein: 6.7 g/dL (ref 6.0–8.5)

## 2022-10-20 ENCOUNTER — Other Ambulatory Visit: Payer: Self-pay | Admitting: Family Medicine

## 2022-10-20 DIAGNOSIS — E119 Type 2 diabetes mellitus without complications: Secondary | ICD-10-CM

## 2022-10-24 ENCOUNTER — Other Ambulatory Visit: Payer: Self-pay

## 2022-10-24 DIAGNOSIS — E119 Type 2 diabetes mellitus without complications: Secondary | ICD-10-CM

## 2022-10-24 MED ORDER — GLIPIZIDE 5 MG PO TABS: ORAL_TABLET | ORAL | 5 refills | Status: DC

## 2022-10-31 ENCOUNTER — Other Ambulatory Visit: Payer: Self-pay | Admitting: Family Medicine

## 2022-10-31 DIAGNOSIS — E119 Type 2 diabetes mellitus without complications: Secondary | ICD-10-CM

## 2022-11-08 DIAGNOSIS — H903 Sensorineural hearing loss, bilateral: Secondary | ICD-10-CM | POA: Diagnosis not present

## 2022-11-12 ENCOUNTER — Other Ambulatory Visit: Payer: Self-pay | Admitting: Family Medicine

## 2022-11-16 ENCOUNTER — Ambulatory Visit (INDEPENDENT_AMBULATORY_CARE_PROVIDER_SITE_OTHER): Payer: Medicare PPO | Admitting: Family Medicine

## 2022-11-16 ENCOUNTER — Encounter: Payer: Self-pay | Admitting: Family Medicine

## 2022-11-16 VITALS — BP 120/58 | HR 77 | Temp 97.7°F | Ht 63.5 in | Wt 170.0 lb

## 2022-11-16 DIAGNOSIS — Z79899 Other long term (current) drug therapy: Secondary | ICD-10-CM

## 2022-11-16 DIAGNOSIS — Z0000 Encounter for general adult medical examination without abnormal findings: Secondary | ICD-10-CM | POA: Diagnosis not present

## 2022-11-16 DIAGNOSIS — I10 Essential (primary) hypertension: Secondary | ICD-10-CM | POA: Diagnosis not present

## 2022-11-16 DIAGNOSIS — E119 Type 2 diabetes mellitus without complications: Secondary | ICD-10-CM

## 2022-11-16 DIAGNOSIS — E1169 Type 2 diabetes mellitus with other specified complication: Secondary | ICD-10-CM | POA: Diagnosis not present

## 2022-11-16 DIAGNOSIS — M816 Localized osteoporosis [Lequesne]: Secondary | ICD-10-CM | POA: Diagnosis not present

## 2022-11-16 DIAGNOSIS — E785 Hyperlipidemia, unspecified: Secondary | ICD-10-CM

## 2022-11-16 MED ORDER — LOSARTAN POTASSIUM 100 MG PO TABS: ORAL_TABLET | ORAL | 1 refills | Status: DC

## 2022-11-16 MED ORDER — ROSUVASTATIN CALCIUM 40 MG PO TABS: 40.0000 mg | ORAL_TABLET | Freq: Every day | ORAL | 1 refills | Status: DC

## 2022-11-16 MED ORDER — AMLODIPINE BESYLATE 10 MG PO TABS: 10.0000 mg | ORAL_TABLET | Freq: Every day | ORAL | 1 refills | Status: DC

## 2022-11-16 MED ORDER — ALPRAZOLAM 0.5 MG PO TABS: 0.5000 mg | ORAL_TABLET | Freq: Every day | ORAL | 5 refills | Status: DC

## 2022-11-16 MED ORDER — GLIPIZIDE 5 MG PO TABS: ORAL_TABLET | ORAL | 5 refills | Status: DC

## 2022-11-16 MED ORDER — SERTRALINE HCL 50 MG PO TABS: ORAL_TABLET | ORAL | 1 refills | Status: DC

## 2022-11-16 MED ORDER — EMPAGLIFLOZIN 25 MG PO TABS: 25.0000 mg | ORAL_TABLET | Freq: Every day | ORAL | 1 refills | Status: DC

## 2022-11-16 NOTE — Progress Notes (Signed)
Subjective:    Patient ID: Christie [name], female    DOB: [birth year], 76 y.o.   MRN: [id number]  HPI  The patient comes in today for a wellness visit. Patient also has diabetes issues blood pressure issues history of aortic stenosis She also has insomnia issues She states she could do a better job eating healthier Does take her cholesterol medicine Stress levels are doing well Denies being depressed  A review of their health history was completed.  A review of medications was also completed.  Any needed refills; alprazolam one nightly needs additional refills authorized  Eating habits: good  Falls/  MVA accidents in past few months: no  Regular exercise: walk some  Specialist pt sees on regular basis: Dr Dorna Bloom, Cardiology  Preventative health issues were discussed.   Additional concerns:    Review of Systems     Objective:   Physical Exam  General-in no acute distress Eyes-no discharge Lungs-respiratory rate normal, CTA CV-no murmurs,RRR Extremities skin warm dry no edema Neuro grossly normal Behavior normal, alert Breast exam normal bilateral Pelvic exam hysterectomy noted  Diverticulum noted Results for orders placed or performed in visit on 46/27/03  Basic metabolic panel  Result Value Ref Range   Glucose 120 (H) 70 - 99 mg/dL   BUN 12 8 - 27 mg/dL   Creatinine, Ser 0.53 (L) 0.57 - 1.00 mg/dL   eGFR 95 >59 mL/min/1.73   BUN/Creatinine Ratio 23 12 - 28   Sodium 143 134 - 144 mmol/L   Potassium 4.1 3.5 - 5.2 mmol/L   Chloride 105 96 - 106 mmol/L   CO2 22 20 - 29 mmol/L   Calcium 9.3 8.7 - 10.3 mg/dL  Lipid panel  Result Value Ref Range   Cholesterol, Total 152 100 - 199 mg/dL   Triglycerides 115 0 - 149 mg/dL   HDL 72 >39 mg/dL   VLDL Cholesterol Cal 20 5 - 40 mg/dL   LDL Chol Calc (NIH) 60 0 - 99 mg/dL   Chol/HDL Ratio 2.1 0.0 - 4.4 ratio  Hepatic Function Panel  Result Value Ref Range   Total Protein 6.7 6.0 - 8.5 g/dL   Albumin 4.5  3.8 - 4.8 g/dL   Bilirubin Total 0.5 0.0 - 1.2 mg/dL   Bilirubin, Direct 0.22 0.00 - 0.40 mg/dL   Alkaline Phosphatase 126 (H) 44 - 121 IU/L   AST 26 0 - 40 IU/L   ALT 28 0 - 32 IU/L  Hemoglobin A1c  Result Value Ref Range   Hgb A1c MFr Bld 7.6 (H) 4.8 - 5.6 %   Est. average glucose Bld gHb Est-mCnc 171 mg/dL  Microalbumin/Creatinine Ratio, Urine  Result Value Ref Range   Creatinine, Urine 95.9 Not Estab. mg/dL   Microalbumin, Urine 84.6 Not Estab. ug/mL   Microalb/Creat Ratio 88 (H) 0 - 29 mg/g creat       Assessment & Plan:  1. Diabetes mellitus without complication ([place]) The patient was seen today as part of a comprehensive visit for diabetes. The importance of keeping her A1c at or below 7 range was discussed.  Discussed diet, activity, and medication compliance Emphasized healthy eating primarily with vegetables fruits and if utilizing meats lean meats such as chicken or fish grilled baked broiled Avoid sugary drinks Minimize and avoid processed foods Fit in regular physical activity preferably 25 to 30 minutes 4 times per week Standard follow-up visit recommended.  Patient aware lack of control and follow-up increases risk of diabetic complications.  Regular follow-up visits Yearly ophthalmology Yearly foot exam  Patient will work harder on better dietary measures she will continue her medicine she does not want to try GLP-1's she is tolerating Jardiance well - glipiZIDE (GLUCOTROL) 5 MG tablet; TAKE 2 TABLETS IN THE MORNING AND 2 1/2 TABLETS IN THE EVENING, WITH MEALS  Dispense: 135 tablet; Refill: 5 - Hemoglobin A1c - Lipid Panel - Basic Metabolic Panel - Hepatic Function Panel  2. Essential hypertension, benign HTN- patient seen for follow-up regarding HTN.   Diet, medication compliance, appropriate labs and refills were completed.   Importance of keeping blood pressure under good control to lessen the risk of complications discussed Regular follow-up visits  discussed  - Hemoglobin A1c - Lipid Panel - Basic Metabolic Panel - Hepatic Function Panel  3. High risk medication use Check labs before next visit - Hemoglobin A1c - Lipid Panel - Basic Metabolic Panel - Hepatic Function Panel  4. Well adult exam Adult wellness-complete.wellness physical was conducted today. Importance of diet and exercise were discussed in detail.  Importance of stress reduction and healthy living were discussed.  In addition to this a discussion regarding safety was also covered.  We also reviewed over immunizations and gave recommendations regarding current immunization needed for age.   In addition to this additional areas were also touched on including: Preventative health exams needed:  Colonoscopy 2022 she does not need any more  Patient was advised yearly wellness exam  - Hemoglobin A1c - Lipid Panel - Basic Metabolic Panel - Hepatic Function Panel  5. Hyperlipidemia associated with type 2 diabetes mellitus (HCC) Hyperlipidemia-importance of diet, weight control, activity, compliance with medications discussed.   Recent labs reviewed.   Any additional labs or refills ordered.   Importance of keeping under good control discussed. Regular follow-up visits discussed  - Hemoglobin A1c - Lipid Panel - Basic Metabolic Panel - Hepatic Function Panel  6. Encounter for subsequent annual wellness visit (AWV) in Medicare patient We reviewed over her overall health.  She does not need a colonoscopy.  As for her mammogram she will keep up with these She does not need Pap smear  We did discuss her osteoporosis she is taking the medicine she has side effects she is know  Patient is to do her lab work and follow-up by spring

## 2022-12-09 ENCOUNTER — Telehealth: Payer: Self-pay | Admitting: *Deleted

## 2022-12-09 NOTE — Telephone Encounter (Signed)
Patient notified and scheduled follow up office visit 12/13/22 with Dr Nicki Reaper

## 2022-12-09 NOTE — Telephone Encounter (Signed)
Thersa Salt G, DO  I would advised close monitoring of her blood pressure and follow up with Dr. Nicki Reaper.

## 2022-12-09 NOTE — Telephone Encounter (Signed)
Patient states she had a "dizzy spell" yesterday and her daughter who is PT checked her BP 160/82 Patient states she takes Losartan '100mg'$  in am this am she took her med and 10-15 minutes later took BP and it was 172/94 and wondered if she needed to do anything different

## 2022-12-13 ENCOUNTER — Ambulatory Visit: Payer: Medicare PPO | Admitting: Family Medicine

## 2022-12-13 VITALS — BP 148/76 | HR 73 | Temp 97.5°F | Ht 63.5 in | Wt 171.0 lb

## 2022-12-13 DIAGNOSIS — G4762 Sleep related leg cramps: Secondary | ICD-10-CM

## 2022-12-13 DIAGNOSIS — I10 Essential (primary) hypertension: Secondary | ICD-10-CM

## 2022-12-13 MED ORDER — VALSARTAN 160 MG PO TABS: 160.0000 mg | ORAL_TABLET | Freq: Every day | ORAL | 3 refills | Status: DC

## 2022-12-13 NOTE — Progress Notes (Signed)
   Subjective:    Patient ID: Christie [name], female    DOB: 1946-04-27, 76 y.o.   MRN: [id number]  HPI Patient reports elevated BP readings along with a dizzy spell 4 days ago , has brought some home readings taking amlodipine and losartan  Blood pressure readings have been elevated multiple times she had a dizzy spell last Thursday where she was standing at the cabinet to get a drink and she turned around and afterwards she felt dizzy for a couple minutes no unilateral numbness or weakness. Review of Systems     Objective:   Physical Exam  General-in no acute distress Eyes-no discharge Lungs-respiratory rate normal, CTA CV-no murmurs,RRR Extremities skin warm dry no edema Neuro grossly normal Behavior normal, alert  No sign of stroke if any symptoms of stroke occur immediately go to ER or notify us if having any issues or problems     Assessment & Plan:  She relates compliance with amlodipine and losartan She is already on Jardiance Kidney functions look good She denies any major setbacks Blood pressure checked multiple times best reading 148/76  Change losartan to valsartan 160 mg 1 daily continue amlodipine continue Jardiance watch diet follow-up in 2 weeks to recheck blood pressure send [place] a MyChart reading next week

## 2022-12-22 ENCOUNTER — Encounter: Payer: Self-pay | Admitting: Family Medicine

## 2022-12-22 ENCOUNTER — Ambulatory Visit (HOSPITAL_COMMUNITY): Payer: Medicare PPO | Attending: Internal Medicine

## 2022-12-22 DIAGNOSIS — I350 Nonrheumatic aortic (valve) stenosis: Secondary | ICD-10-CM

## 2022-12-22 LAB — ECHOCARDIOGRAM COMPLETE
AR max vel: 1.33 cm2
AV Area VTI: 1.3 cm2
AV Area mean vel: 1.33 cm2
AV Mean grad: 14 mmHg
AV Peak grad: 26.2 mmHg
Ao pk vel: 2.56 m/s
Area-P 1/2: 3.06 cm2
P 1/2 time: 519 msec
S' Lateral: 2.6 cm

## 2022-12-23 NOTE — Telephone Encounter (Signed)
Nurses Please bring Christie [name] by next week near lunchtime so we can check her blood pressure.  Please have her on my schedule 1120 or 1140.  May well need to adjust the dose of the valsartan depending on her pressure

## 2022-12-26 DIAGNOSIS — G4762 Sleep related leg cramps: Secondary | ICD-10-CM | POA: Diagnosis not present

## 2022-12-26 DIAGNOSIS — I10 Essential (primary) hypertension: Secondary | ICD-10-CM | POA: Diagnosis not present

## 2022-12-27 LAB — BASIC METABOLIC PANEL
BUN/Creatinine Ratio: 23 (ref 12–28)
BUN: 17 mg/dL (ref 8–27)
CO2: 25 mmol/L (ref 20–29)
Calcium: 10.2 mg/dL (ref 8.7–10.3)
Chloride: 102 mmol/L (ref 96–106)
Creatinine, Ser: 0.75 mg/dL (ref 0.57–1.00)
Glucose: 205 mg/dL — ABNORMAL HIGH (ref 70–99)
Potassium: 4.4 mmol/L (ref 3.5–5.2)
Sodium: 142 mmol/L (ref 134–144)
eGFR: 81 mL/min/{1.73_m2} (ref >59)

## 2022-12-27 LAB — MAGNESIUM: Magnesium: 2.2 mg/dL (ref 1.6–2.3)

## 2022-12-28 ENCOUNTER — Ambulatory Visit: Payer: Medicare PPO | Admitting: Family Medicine

## 2022-12-30 ENCOUNTER — Encounter: Payer: Self-pay | Admitting: Family Medicine

## 2022-12-30 ENCOUNTER — Ambulatory Visit: Payer: Medicare PPO | Admitting: Family Medicine

## 2022-12-30 VITALS — BP 132/74 | Wt 171.4 lb

## 2022-12-30 DIAGNOSIS — I10 Essential (primary) hypertension: Secondary | ICD-10-CM | POA: Diagnosis not present

## 2022-12-30 NOTE — Progress Notes (Unsigned)
   Subjective:    Patient ID: Christie [name], female    DOB: November 13, 1946, 77 y.o.   MRN: [id number]  HPI Pt arrives for follow up on HTN. Pt reports still having elevated reading but not as high as at visit. Occasional headache. Light headed at times. Pt did send reading via my chart on 12/22/22 and has brought in readings. She was unable to bring her blood pressure cuff with her She has gone through some stress recently with the loss of a family member Denies any chest tightness pressure pain Review of Systems     Objective:   Physical Exam Lungs fair heart regular BP overall good currently       Assessment & Plan:   Recent labs look good HTN good control Continue current measures Follow-up later in the spring lab work before that visit Given that blood pressure readings can gradually go up and down on home monitoring I recommend check blood pressure periodically and only if necessary or having side effects or problems otherwise daily blood pressure checks can create worry She can always bring her machine in to have it checked with ours Continue current medicines No adjustments necessary currently

## 2023-01-10 ENCOUNTER — Ambulatory Visit: Payer: Medicare PPO | Admitting: Cardiovascular Disease

## 2023-01-26 NOTE — Progress Notes (Signed)
CARDIOLOGY CONSULT NOTE       Patient ID: Christie [name] MRN: [id number] DOB/AGE: Oct 19, 1946 77 y.o.  Admit date: (Not on file) Referring Physician: Wolfgang Phoenix Primary Physician: Kathyrn Drown, MD Primary Cardiologist: Johnsie Cancel   HPI:  77 y.o. referred by Dr Wolfgang Phoenix 10/27/20 for mixed aortic valve disease. I reviewed her echo from 09/18/20 and mean gradient only 12 mmHg peak 23 mmHg DVI 0.56 and AVA 1.3 cm2. Read as mild AS and mild AR with EF 60-65% Compared to echo done 11/14/16 not much change when mean gradient was 11 and peak 21 mmHg.  She has a history of HTN, DM, and hyperlipidemia on appropriate meds  Of note she had severe abdominal pain 10/12/20 and was referred to GI Abdominal CT was benign as was her CBC and Cr   She is a long standing diabetic and has some coronary calcium on CT but no history of clinical angina. 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   Depression    DM (diabetes mellitus) ([place]) 2012   oral medications   Elevated liver enzymes    Hearing deficit    History of kidney stones    Hyperlipidemia    Hyperlipidemia associated with type 2 diabetes mellitus ([place]) 02/25/2020   Hypertension 2000   Spastic colon     Family History  Problem Relation Age of Onset   Diabetes Father    Hypertension Father    Dementia Father    Pancreatic cancer Mother     Social History   Socioeconomic History   Marital status: Widowed    Spouse name: Not on  file   Number of children: Not on file   Years of education: Not on file   Highest education level: Not on file  Occupational History   Not on file  Tobacco Use   Smoking status: Never   Smokeless tobacco: Never  Vaping Use   Vaping Use: Never used  Substance and Sexual Activity   Alcohol use: No    Alcohol/week: 0.0 standard drinks of alcohol   Drug use: No   Sexual activity: Not on file  Other Topics Concern   Not on file  Social History Narrative   Not on file   Social Determinants of Health   Financial Resource Strain: Not on file  Food Insecurity: Not on file  Transportation Needs: Not on file  Physical Activity: Not on file  Stress: Not on file  Social Connections: Not on file  Intimate Partner Violence: Not on file    Past Surgical History:  Procedure Laterality Date   ANTERIOR LAT LUMBAR FUSION Right 07/04/2017   Procedure: Right LUMBAR TWO-THREE LUMBAR THREE-FOUR LUMBAR FOUR-FIVE Anterior lateral lumbar interbody fusion with percutaneous pedicle screws;  Surgeon: Erline Levine, MD;  Location: [facility];  Service: Neurosurgery;  Laterality: Right;  Right lateral approach  ANTERIOR LATERAL LUMBAR FUSION WITH PERCUTANEOUS SCREW 3 LEVEL N/A 07/04/2017   Procedure: PERCUTANEOUS SCREW THREE LEVEL LUMBAR TWO-THREE LUMBAR THREE-FOUR LUMBAR FOUR-FIVE;  Surgeon: Erline Levine, MD;  Location: [facility];  Service: Neurosurgery;  Laterality: N/A;  Prone   BACK SURGERY  2004 and 2017   BIOPSY  02/23/2021   Procedure: BIOPSY;  Surgeon: Harvel Quale, MD;  [facility];  Service: Gastroenterology;;   CHOLECYSTECTOMY     COLONOSCOPY  feb 2005   COLONOSCOPY N/A 02/13/2014   Procedure: COLONOSCOPY;  Surgeon: Rogene Houston, MD;  [facility];  Service: Endoscopy;  Laterality: N/A;  930-moved to 100 Ann notified pt   COLONOSCOPY WITH PROPOFOL N/A 02/23/2021   Procedure: COLONOSCOPY WITH PROPOFOL;  Surgeon: Harvel Quale, MD;  [facility];  Service: Gastroenterology;  Laterality: N/A;  AM   ESOPHAGOGASTRODUODENOSCOPY  09/2005   EYE SURGERY Right    catatract removed   EYE SURGERY Right    macular   surgery    KIDNEY STONE SURGERY     for a staghorn kidney stone   POLYPECTOMY  02/23/2021   Procedure: POLYPECTOMY;  Surgeon: Montez Morita, Quillian Quince, MD;  [facility];  Service: Gastroenterology;;   STAPEDECTOMY Bilateral    Right 1991 and Left 1994   TOTAL ABDOMINAL HYSTERECTOMY  1982   vulva cancer surgery  1999      Current Outpatient Medications:    ACCU-CHEK GUIDE test strip, USE TO CHECK BLOOD SUGAR ONCE DAILY AS DIRECTED, Disp: 50 strip, Rfl: 0   acetaminophen (TYLENOL) 500 MG tablet, Take 1,000 mg by mouth every 6 (six) hours as needed (pain)., Disp: , Rfl:    alendronate (FOSAMAX) 70 MG tablet, Take 1 tablet (70 mg total) by mouth every 7 (seven) days. Take with a full glass of water on an empty stomach., Disp: 4 tablet, Rfl: 11   ALPRAZolam (XANAX) 0.5 MG tablet, Take 1 tablet (0.5 mg total) by mouth at bedtime., Disp: 30 tablet, Rfl: 5   amLODipine (NORVASC) 10 MG tablet, Take 1 tablet (10 mg total) by mouth daily., Disp: 90 tablet, Rfl: 1   Ascorbic Acid (VITAMIN C) 1000 MG tablet, Take 1,000 mg by mouth daily., Disp: , Rfl:    cholecalciferol (VITAMIN D) 1000 UNITS tablet, Take 1,000 Units by mouth daily., Disp: , Rfl:    dicyclomine (BENTYL) 10 MG capsule, 1 taken 3 times daily as needed for discomfort, Disp: 30 capsule, Rfl: 2   docusate sodium (COLACE) 100 MG capsule, Take 300 mg by mouth at bedtime., Disp: , Rfl:    empagliflozin (JARDIANCE) 25 MG TABS tablet, Take 1 tablet (25 mg total) by mouth daily., Disp: 90 tablet, Rfl: 1   glipiZIDE (GLUCOTROL) 5 MG tablet, TAKE 2 TABLETS IN THE MORNING AND 2 1/2 TABLETS IN THE EVENING, WITH MEALS, Disp: 135 tablet, Rfl: 5   Multiple Vitamins-Minerals (MACULAR VITAMIN BENEFIT PO), Take 1 tablet by mouth in the morning and at bedtime., Disp: , Rfl:     OVER THE COUNTER MEDICATION, IB Guard BID, Disp: , Rfl:    rosuvastatin (CRESTOR) 40 MG tablet, Take 1 tablet (40 mg total) by mouth daily., Disp: 90 tablet, Rfl: 1   sertraline (ZOLOFT) 50 MG tablet, TAKE (1) TABLET BY MOUTH ONCE DAILY., Disp: 90 tablet, Rfl: 1   valsartan (DIOVAN) 160 MG tablet, Take 1 tablet (160 mg total) by mouth daily., Disp: 90 tablet, Rfl: 3   zinc gluconate 50 MG  tablet, Take 50 mg by mouth daily., Disp: , Rfl:     Physical Exam: Blood pressure (!) 168/72, pulse 80, height 5' 3.5" (1.613 m), weight 174 lb 3.2 oz (79 kg), SpO2 96 %.    Affect appropriate Elderly female  HEENT: normal Neck supple with no adenopathy JVP normal no bruits no thyromegaly Lungs clear with no wheezing and good diaphragmatic motion Heart:  S1/S2 preserved AS  murmur, no rub, gallop or click PMI normal Abdomen: benighn, BS positve, no tenderness, no AAA no bruit.  No HSM or HJR Distal pulses intact with no bruits No edema Neuro non-focal Skin warm and dry No muscular weakness   Labs:   Lab Results  Component Value Date   WBC 9.7 07/11/2022   HGB 15.6 07/11/2022   HCT 47.9 (H) 07/11/2022   MCV 91 07/11/2022   PLT 163 07/11/2022   No results for input(s): "NA", "K", "CL", "CO2", "BUN", "CREATININE", "CALCIUM", "PROT", "BILITOT", "ALKPHOS", "ALT", "AST", "GLUCOSE" in the last 168 hours.  Invalid input(s): "LABALBU" No results found for: "CKTOTAL", "CKMB", "CKMBINDEX", "TROPONINI"  Lab Results  Component Value Date   CHOL 152 10/13/2022   CHOL 149 03/14/2022   CHOL 151 09/20/2021   Lab Results  Component Value Date   HDL 72 10/13/2022   HDL 73 03/14/2022   HDL 70 09/20/2021   Lab Results  Component Value Date   LDLCALC 60 10/13/2022   LDLCALC 57 03/14/2022   LDLCALC 62 09/20/2021   Lab Results  Component Value Date   TRIG 115 10/13/2022   TRIG 106 03/14/2022   TRIG 107 09/20/2021   Lab Results  Component Value Date   CHOLHDL 2.1 10/13/2022   CHOLHDL 2.0  03/14/2022   CHOLHDL 2.2 09/20/2021   No results found for: "LDLDIRECT"    Radiology: No results found.   EKG: NSR rate 74 normal ECG 02/03/2023 NSR rate 77 normal    ASSESSMENT AND PLAN:   1. AVD:  Mild AS/AR normal EF not much change since 2017 on recent TTE done 12/22/2022 observe  2. HTN:  Change to Diovan/HCTZ 160 mg/ 25 mg f/u Dr Wolfgang Phoenix next week  3. DM:  Discussed low carb diet.  Target hemoglobin A1c is 6.5 or less.  Continue current medications. 4. HLD:  Continue statin labs with primary 5. GI:  Resolved pain no pathology on CT 6. CAD:  Risk with long standing DM and age normal myovue 11/17/21   D/C Diovan Add Diovan/HcTZ  F/U in a year with echo for AS/AR     Signed: Jenkins Rouge 02/03/2023, 3:32 PM

## 2023-01-31 DIAGNOSIS — Z1231 Encounter for screening mammogram for malignant neoplasm of breast: Secondary | ICD-10-CM | POA: Diagnosis not present

## 2023-01-31 LAB — HM MAMMOGRAPHY

## 2023-02-02 ENCOUNTER — Telehealth: Payer: Self-pay

## 2023-02-02 NOTE — Telephone Encounter (Signed)
Patient called stated she is having issues with her BP. Yesterday her morning BP was 189/86 and hour later the patient took her BP and it was 156/75. Patient stated later on that day her BP ran 155/90. Patient  state she has had the occasional headache but is not sure if it is related to her sinuses.Patient denies chest pain, and SOB. She would like Dr Nicki Reaper to give her a call [phone].

## 2023-02-02 NOTE — Telephone Encounter (Signed)
I did discuss the situation with the patient She will bring her blood pressure cuff with her She will also bring her pill bottles She is coming at 11:30 AM on Tuesday please put her on the schedule thank you

## 2023-02-03 ENCOUNTER — Ambulatory Visit: Payer: Medicare PPO | Attending: Cardiovascular Disease | Admitting: Cardiovascular Disease

## 2023-02-03 ENCOUNTER — Encounter: Payer: Self-pay | Admitting: Cardiovascular Disease

## 2023-02-03 VITALS — BP 168/72 | HR 80 | Ht 63.5 in | Wt 174.2 lb

## 2023-02-03 DIAGNOSIS — I350 Nonrheumatic aortic (valve) stenosis: Secondary | ICD-10-CM

## 2023-02-03 DIAGNOSIS — I10 Essential (primary) hypertension: Secondary | ICD-10-CM

## 2023-02-03 DIAGNOSIS — E782 Mixed hyperlipidemia: Secondary | ICD-10-CM

## 2023-02-03 MED ORDER — VALSARTAN-HYDROCHLOROTHIAZIDE 160-25 MG PO TABS: 1.0000 | ORAL_TABLET | Freq: Every day | ORAL | 3 refills | Status: DC

## 2023-02-03 NOTE — Patient Instructions (Signed)
Medication Instructions:  Your physician has recommended you make the following change in your medication:   Stop Taking Valsartan  Start Taking Valsartan-HCTZ 160-25 mg Daily   *If you need a refill on your cardiac medications before your next appointment, please call your pharmacy*   Lab Work: NONE   If you have labs (blood work) drawn today and your tests are completely normal, you will receive your results only by: [place] (if you have MyChart) OR A paper copy in the mail If you have any lab test that is abnormal or we need to change your treatment, we will call you to review the results.   Testing/Procedures: NONE    Follow-Up: At [facility], you and your health needs are our priority.  As part of our continuing mission to provide you with exceptional heart care, we have created designated Provider Care Teams.  These Care Teams include your primary Cardiologist (physician) and Advanced Practice Providers (APPs -  Physician Assistants and Nurse Practitioners) who all work together to provide you with the care you need, when you need it.  We recommend signing up for the patient portal called "MyChart".  Sign up information is provided on this After Visit Summary.  MyChart is used to connect with patients for Virtual Visits (Telemedicine).  Patients are able to view lab/test results, encounter notes, upcoming appointments, etc.  Non-urgent messages can be sent to your provider as well.   To learn more about what you can do with MyChart, go to [email or web address].    Your next appointment:   1 year(s)  Provider:   Jenkins Rouge, MD    Other Instructions Thank you for choosing [facility]!

## 2023-02-06 NOTE — Telephone Encounter (Signed)
Patient appt is scheduled.

## 2023-02-07 ENCOUNTER — Ambulatory Visit: Payer: Medicare PPO | Admitting: Family Medicine

## 2023-02-07 ENCOUNTER — Encounter: Payer: Self-pay | Admitting: *Deleted

## 2023-02-07 VITALS — BP 132/76 | Wt 167.4 lb

## 2023-02-07 DIAGNOSIS — I10 Essential (primary) hypertension: Secondary | ICD-10-CM

## 2023-02-07 NOTE — Progress Notes (Signed)
   Subjective:    Patient ID: Christie [name], female    DOB: 10/18/1946, 77 y.o.   MRN: [id number]  HPI  Patient arrives today to follow up on blood pressure. Patient states she saw her cardiologists Friday and he increased her blood pressure medication. Patient has had elevated blood pressure last week she ended up calling we talked with her and when she went to cardiology on Friday they added HCTZ to her medicine Her blood pressure readings are still higher than what it should be She seems to be tolerating the adjustment well She is trying to watch her diet trying to fit in little bit of walking denies any chest pain shortness of breath    Review of Systems     Objective:   Physical Exam  Lungs clear heart regular pulse normal blood pressure checked with her cuff and our cuff Her cuff does read a little higher than what ours does 1 reading by the nurse was good 1 reading by myself was slightly elevated      Assessment & Plan:  Given that blood pressure readings typically trending downward over 4 to 6 weeks after medication adjustment I would not recommend at this point in time adding additional medicine instead healthy diet regular physical activity and then on follow-up visit in several weeks time patient will do lab work in a couple weeks time to make sure she is adjusting to the HCTZ both in regards to kidney function and potassium

## 2023-02-07 NOTE — Patient Instructions (Signed)

## 2023-02-13 ENCOUNTER — Other Ambulatory Visit: Payer: Self-pay | Admitting: Family Medicine

## 2023-02-13 DIAGNOSIS — E119 Type 2 diabetes mellitus without complications: Secondary | ICD-10-CM

## 2023-02-24 ENCOUNTER — Encounter: Payer: Self-pay | Admitting: Family Medicine

## 2023-02-24 DIAGNOSIS — I10 Essential (primary) hypertension: Secondary | ICD-10-CM

## 2023-02-24 DIAGNOSIS — Z79899 Other long term (current) drug therapy: Secondary | ICD-10-CM

## 2023-02-28 DIAGNOSIS — I10 Essential (primary) hypertension: Secondary | ICD-10-CM | POA: Diagnosis not present

## 2023-02-28 NOTE — Telephone Encounter (Signed)
Nurses note-some of the readings are higher than what I would wish.  Other readings look good.  I would recommend continuing as is and we will do a in person evaluation on her follow-up in the near future and review over her readings as well as check a new reading in the office.  We can also discuss what she read at that time thanks

## 2023-03-01 LAB — BASIC METABOLIC PANEL (7)
BUN/Creatinine Ratio: 26 (ref 12–28)
BUN: 17 mg/dL (ref 8–27)
CO2: 26 mmol/L (ref 20–29)
Chloride: 98 mmol/L (ref 96–106)
Creatinine, Ser: 0.65 mg/dL (ref 0.57–1.00)
Glucose: 259 mg/dL — ABNORMAL HIGH (ref 70–99)
Potassium: 3.8 mmol/L (ref 3.5–5.2)
Sodium: 140 mmol/L (ref 134–144)
eGFR: 90 mL/min/{1.73_m2} (ref >59)

## 2023-03-10 ENCOUNTER — Encounter: Payer: Self-pay | Admitting: Family Medicine

## 2023-03-10 ENCOUNTER — Ambulatory Visit: Payer: Medicare PPO | Admitting: Family Medicine

## 2023-03-10 VITALS — BP 96/60 | Ht 63.5 in | Wt 167.4 lb

## 2023-03-10 DIAGNOSIS — E119 Type 2 diabetes mellitus without complications: Secondary | ICD-10-CM

## 2023-03-10 DIAGNOSIS — I10 Essential (primary) hypertension: Secondary | ICD-10-CM

## 2023-03-10 DIAGNOSIS — I952 Hypotension due to drugs: Secondary | ICD-10-CM

## 2023-03-10 MED ORDER — VALSARTAN 160 MG PO TABS: 160.0000 mg | ORAL_TABLET | Freq: Every day | ORAL | 1 refills | Status: DC

## 2023-03-10 NOTE — Progress Notes (Signed)
   Subjective:    Patient ID: NARAYAH PULEO, female    DOB: [birth year], 77 y.o.   MRN: [id number]  HPI  Patient arrives for a follow up on blood pressure. Patient recently was placed on HCTZ with her medications since then her glucose numbers have gone up dramatically.  She also relates her blood pressures drop so low she finds herself feeling lightheaded and feeling very woozy and weak no energy.  She denies nausea vomiting sweats or chills.  She is trying to watch her diet. Patient states that when he blood pressure meds where changed now her blood sugar is running high    Review of Systems     Objective:   Physical Exam Lungs clear heart regular pulse normal BP sitting and standing both are on the low end 96/60 is the best pressure and it does drop some with standing       Assessment & Plan:  Excessive control of blood pressure Back off HCTZ For control of diabetes related to the HCTZ she was doing well on previous medicine We did discuss GLP-1 medicines both oral and injectable we also discussed Januvia.  Hold off on these currently HCTZ also causing sugar related issues We did discuss various options to help with the sugar but we are going to hold off on this to see if things improve without doing this She will do follow-up labs before her next visit We will recheck blood pressure again in just a couple weeks.  Hopefully her sugars are come under better control.

## 2023-03-20 DIAGNOSIS — E119 Type 2 diabetes mellitus without complications: Secondary | ICD-10-CM | POA: Diagnosis not present

## 2023-03-20 DIAGNOSIS — E785 Hyperlipidemia, unspecified: Secondary | ICD-10-CM | POA: Diagnosis not present

## 2023-03-20 DIAGNOSIS — Z0000 Encounter for general adult medical examination without abnormal findings: Secondary | ICD-10-CM | POA: Diagnosis not present

## 2023-03-20 DIAGNOSIS — E1169 Type 2 diabetes mellitus with other specified complication: Secondary | ICD-10-CM | POA: Diagnosis not present

## 2023-03-20 DIAGNOSIS — I10 Essential (primary) hypertension: Secondary | ICD-10-CM | POA: Diagnosis not present

## 2023-03-20 DIAGNOSIS — Z79899 Other long term (current) drug therapy: Secondary | ICD-10-CM | POA: Diagnosis not present

## 2023-03-21 LAB — LIPID PANEL
Chol/HDL Ratio: 2.3 ratio (ref 0.0–4.4)
Cholesterol, Total: 164 mg/dL (ref 100–199)
HDL: 70 mg/dL (ref >39)
LDL Chol Calc (NIH): 72 mg/dL (ref 0–99)
Triglycerides: 128 mg/dL (ref 0–149)
VLDL Cholesterol Cal: 22 mg/dL (ref 5–40)

## 2023-03-21 LAB — HEPATIC FUNCTION PANEL
ALT: 36 IU/L — ABNORMAL HIGH (ref 0–32)
AST: 32 IU/L (ref 0–40)
Albumin: 4.6 g/dL (ref 3.8–4.8)
Alkaline Phosphatase: 109 IU/L (ref 44–121)
Bilirubin Total: 0.5 mg/dL (ref 0.0–1.2)
Bilirubin, Direct: 0.19 mg/dL (ref 0.00–0.40)
Total Protein: 6.7 g/dL (ref 6.0–8.5)

## 2023-03-21 LAB — BASIC METABOLIC PANEL
BUN/Creatinine Ratio: 25 (ref 12–28)
BUN: 13 mg/dL (ref 8–27)
CO2: 22 mmol/L (ref 20–29)
Calcium: 9.5 mg/dL (ref 8.7–10.3)
Chloride: 106 mmol/L (ref 96–106)
Creatinine, Ser: 0.52 mg/dL — ABNORMAL LOW (ref 0.57–1.00)
Glucose: 135 mg/dL — ABNORMAL HIGH (ref 70–99)
Potassium: 4.5 mmol/L (ref 3.5–5.2)
Sodium: 143 mmol/L (ref 134–144)
eGFR: 95 mL/min/{1.73_m2} (ref >59)

## 2023-03-21 LAB — HEMOGLOBIN A1C
Est. average glucose Bld gHb Est-mCnc: 206 mg/dL
Hgb A1c MFr Bld: 8.8 % — ABNORMAL HIGH (ref 4.8–5.6)

## 2023-03-23 ENCOUNTER — Ambulatory Visit: Payer: Medicare PPO | Admitting: Family Medicine

## 2023-03-23 VITALS — BP 144/60 | HR 74 | Ht 63.5 in | Wt 172.0 lb

## 2023-03-23 DIAGNOSIS — E119 Type 2 diabetes mellitus without complications: Secondary | ICD-10-CM

## 2023-03-23 DIAGNOSIS — I10 Essential (primary) hypertension: Secondary | ICD-10-CM | POA: Diagnosis not present

## 2023-03-23 MED ORDER — RYBELSUS 3 MG PO TABS: 3.0000 mg | ORAL_TABLET | Freq: Every day | ORAL | 0 refills | Status: DC

## 2023-03-23 NOTE — Progress Notes (Signed)
   Subjective:    Patient ID: Christie [name], female    DOB: [birth year], 77 y.o.   MRN: [id number]  Diabetes She presents for her follow-up diabetic visit. She has type 2 diabetes mellitus. Current diabetic treatments: glipizide, jardiance.  Hypertension This is a chronic problem. Treatments tried: valsartan, amlodipine.  FBS today 150 Patient's sugars have been running 120-140s She does take her medicine regular basis Her A1c came back elevated Blood pressure is slightly elevated but doing better than when she was on the HCTZ She does have a little bit of ankle edema but minimal No shortness of breath or breathing issues Tries to eat relatively healthy Tries to stay physically active   Review of Systems     Objective:   Physical Exam  General-in no acute distress Eyes-no discharge Lungs-respiratory rate normal, CTA CV-no murmurs,RRR Extremities skin warm dry no edema Neuro grossly normal Behavior normal, alert       Assessment & Plan:  Blood pressure slightly elevated but on recheck improved I do not want to add additional medicines at this time I am concerned we will cause her blood pressure to go too low on the diastolic  She had similar experience with HCTZ  As for the diabetes we will try Rybelsus.  Unfortunately there is no low cost alternatives.  She is willing to try the Rybelsus 3 mg daily for the first month if she tolerates that we will go to 7 mg If she tolerates that we will go to 14 mg She will monitor her glucoses if they start going closer to 100 she will notify us to cut back on glipizide if they are below 100 she will stop glipizide  She will follow-up in approximately 6 weeks

## 2023-03-30 ENCOUNTER — Other Ambulatory Visit: Payer: Self-pay | Admitting: Family Medicine

## 2023-03-30 MED ORDER — RYBELSUS 7 MG PO TABS: 7.0000 mg | ORAL_TABLET | Freq: Every day | ORAL | 2 refills | Status: DC

## 2023-04-05 ENCOUNTER — Other Ambulatory Visit: Payer: Self-pay | Admitting: Family Medicine

## 2023-04-05 DIAGNOSIS — E119 Type 2 diabetes mellitus without complications: Secondary | ICD-10-CM

## 2023-04-12 NOTE — Telephone Encounter (Signed)
Nurses Please order valsartan 160 mg New directions 1-1/2 tablet daily #45 with 4 refills Please have Anairis do a metabolic 7 in approximately 2 weeks to make sure her kidneys are getting along with the newer dose She is to keep her follow-up visit later in May She may send [place] updated blood pressure readings in the next 10 to 14 days Connect with [place] sooner if any problems Thanks-Dr. Lilyan Punt

## 2023-04-14 MED ORDER — VALSARTAN 160 MG PO TABS: 240.0000 mg | ORAL_TABLET | Freq: Every day | ORAL | 4 refills | Status: DC

## 2023-04-14 NOTE — Addendum Note (Signed)
Addended by: Margaretha Sheffield on: 04/14/2023 04:55 PM   Modules accepted: Orders

## 2023-04-19 ENCOUNTER — Other Ambulatory Visit: Payer: Self-pay | Admitting: Family Medicine

## 2023-04-19 MED ORDER — RYBELSUS 7 MG PO TABS: 7.0000 mg | ORAL_TABLET | Freq: Every day | ORAL | 2 refills | Status: DC

## 2023-04-26 DIAGNOSIS — Z79899 Other long term (current) drug therapy: Secondary | ICD-10-CM | POA: Diagnosis not present

## 2023-04-26 DIAGNOSIS — I10 Essential (primary) hypertension: Secondary | ICD-10-CM | POA: Diagnosis not present

## 2023-04-27 LAB — BASIC METABOLIC PANEL
BUN/Creatinine Ratio: 36 — ABNORMAL HIGH (ref 12–28)
BUN: 22 mg/dL (ref 8–27)
CO2: 23 mmol/L (ref 20–29)
Calcium: 10 mg/dL (ref 8.7–10.3)
Chloride: 101 mmol/L (ref 96–106)
Creatinine, Ser: 0.61 mg/dL (ref 0.57–1.00)
Glucose: 294 mg/dL — ABNORMAL HIGH (ref 70–99)
Potassium: 4.1 mmol/L (ref 3.5–5.2)
Sodium: 142 mmol/L (ref 134–144)
eGFR: 91 mL/min/{1.73_m2} (ref >59)

## 2023-05-10 ENCOUNTER — Ambulatory Visit: Payer: Medicare PPO | Admitting: Family Medicine

## 2023-05-10 VITALS — BP 138/86 | Ht 63.5 in | Wt 170.8 lb

## 2023-05-10 DIAGNOSIS — E1169 Type 2 diabetes mellitus with other specified complication: Secondary | ICD-10-CM | POA: Diagnosis not present

## 2023-05-10 DIAGNOSIS — I10 Essential (primary) hypertension: Secondary | ICD-10-CM | POA: Diagnosis not present

## 2023-05-10 DIAGNOSIS — E785 Hyperlipidemia, unspecified: Secondary | ICD-10-CM

## 2023-05-10 DIAGNOSIS — E119 Type 2 diabetes mellitus without complications: Secondary | ICD-10-CM

## 2023-05-10 DIAGNOSIS — Z7984 Long term (current) use of oral hypoglycemic drugs: Secondary | ICD-10-CM | POA: Diagnosis not present

## 2023-05-10 DIAGNOSIS — Z79899 Other long term (current) drug therapy: Secondary | ICD-10-CM

## 2023-05-10 MED ORDER — RYBELSUS 14 MG PO TABS: 14.0000 mg | ORAL_TABLET | Freq: Every day | ORAL | 2 refills | Status: DC

## 2023-05-10 NOTE — Progress Notes (Unsigned)
   Subjective:    Patient ID: Christie [name], female    DOB: 10-01-1946, 77 y.o.   MRN: [id number]  HPI Patient arrives for a follow up on blood pressure and states her sugars have been running high Patient's glucose is running high in the 140-160 range fasting She is making changes from a dietary standpoint but still drinks half sweet tea I have encouraged her to get away from that Also her blood pressure been running high she brings her cuff in We did check her blood pressure with her cuff Check it with our cuff With her cuff she gets 167/80 with our cuff we are getting 140/68 Arm large cuff is more accurate than her current electronic cuff  Review of Systems     Objective:   Physical Exam  General-in no acute distress Eyes-no discharge Lungs-respiratory rate normal, CTA CV-no murmurs,RRR Extremities skin warm dry no edema Neuro grossly normal Behavior normal, alert       Assessment & Plan:  1. Essential hypertension, benign Continue valsartan 1/2 tablet daily Also continue amlodipine Blood pressure reading on her cuff is much higher than what it really is Our readings overall look accurate I would recommend sticking with current dosing  2. High risk medication use No labs currently right now previous labs reviewed  3. Diabetes mellitus without complication (HCC) Labs later this summer to look at A1c We will bump up the Rybelsus to 14 mg if that does not do the job may have to consider Ozempic or Mounjaro patient is open to this If that does not work or if not tolerated then we will have to utilize insulin  4. Hyperlipidemia associated with type 2 diabetes mellitus (HCC) Continue statin

## 2023-05-11 ENCOUNTER — Encounter: Payer: Self-pay | Admitting: Family Medicine

## 2023-05-11 NOTE — Addendum Note (Signed)
Addended by: Margaretha Sheffield on: 05/11/2023 09:34 AM   Modules accepted: Orders

## 2023-05-12 ENCOUNTER — Other Ambulatory Visit: Payer: Self-pay | Admitting: Family Medicine

## 2023-05-18 DIAGNOSIS — H524 Presbyopia: Secondary | ICD-10-CM | POA: Diagnosis not present

## 2023-05-18 DIAGNOSIS — H35033 Hypertensive retinopathy, bilateral: Secondary | ICD-10-CM | POA: Diagnosis not present

## 2023-05-22 ENCOUNTER — Other Ambulatory Visit: Payer: Self-pay | Admitting: Family Medicine

## 2023-05-22 DIAGNOSIS — E119 Type 2 diabetes mellitus without complications: Secondary | ICD-10-CM

## 2023-05-26 ENCOUNTER — Ambulatory Visit: Payer: Medicare PPO | Admitting: Family Medicine

## 2023-05-26 VITALS — BP 136/79 | HR 80 | Wt 169.4 lb

## 2023-05-26 DIAGNOSIS — J069 Acute upper respiratory infection, unspecified: Secondary | ICD-10-CM | POA: Diagnosis not present

## 2023-05-26 NOTE — Progress Notes (Signed)
   Subjective:    Patient ID: Christie [name], female    DOB: 1946/09/20, 77 y.o.   MRN: [id number]  HPI Patient arrives today with  productive cough (white and clear), sore throat, and fatigue. Patient denies any fever.   Patient presents today with respiratory illness Number of days present-6 days  Symptoms include-head congestion sinus pressure white and clear mucus scratchy throat no wheezing or difficulty breathing no high fevers  Presence of worrisome signs (severe shortness of breath, lethargy, etc.) -none  Recent/current visit to urgent care or ER-none  Recent direct exposure to Covid-none  Any current Covid testing-none  Review of Systems     Objective:   Physical Exam Gen-NAD not toxic TMS-normal bilateral T- normal no redness Chest-CTA respiratory rate normal no crackles CV RRR no murmur Skin-warm dry Neuro-grossly normal        Assessment & Plan:  Viral URI Supportive measures discussed No antibiotics indicated Follow-up if problems  300 this being COVID is low Patient does not appear toxic No antibiotics indicated currently Warning signs discussed keep [place] updated

## 2023-06-12 DIAGNOSIS — F324 Major depressive disorder, single episode, in partial remission: Secondary | ICD-10-CM | POA: Diagnosis not present

## 2023-06-12 DIAGNOSIS — M1990 Unspecified osteoarthritis, unspecified site: Secondary | ICD-10-CM | POA: Diagnosis not present

## 2023-06-12 DIAGNOSIS — F419 Anxiety disorder, unspecified: Secondary | ICD-10-CM | POA: Diagnosis not present

## 2023-06-12 DIAGNOSIS — M810 Age-related osteoporosis without current pathological fracture: Secondary | ICD-10-CM | POA: Diagnosis not present

## 2023-06-12 DIAGNOSIS — I10 Essential (primary) hypertension: Secondary | ICD-10-CM | POA: Diagnosis not present

## 2023-06-12 DIAGNOSIS — Z7984 Long term (current) use of oral hypoglycemic drugs: Secondary | ICD-10-CM | POA: Diagnosis not present

## 2023-06-12 DIAGNOSIS — E785 Hyperlipidemia, unspecified: Secondary | ICD-10-CM | POA: Diagnosis not present

## 2023-06-12 DIAGNOSIS — E119 Type 2 diabetes mellitus without complications: Secondary | ICD-10-CM | POA: Diagnosis not present

## 2023-06-12 DIAGNOSIS — R32 Unspecified urinary incontinence: Secondary | ICD-10-CM | POA: Diagnosis not present

## 2023-06-27 ENCOUNTER — Other Ambulatory Visit: Payer: Self-pay | Admitting: Family Medicine

## 2023-06-27 DIAGNOSIS — E119 Type 2 diabetes mellitus without complications: Secondary | ICD-10-CM

## 2023-07-31 DIAGNOSIS — E1169 Type 2 diabetes mellitus with other specified complication: Secondary | ICD-10-CM | POA: Diagnosis not present

## 2023-07-31 DIAGNOSIS — E119 Type 2 diabetes mellitus without complications: Secondary | ICD-10-CM | POA: Diagnosis not present

## 2023-07-31 DIAGNOSIS — E785 Hyperlipidemia, unspecified: Secondary | ICD-10-CM | POA: Diagnosis not present

## 2023-07-31 DIAGNOSIS — Z79899 Other long term (current) drug therapy: Secondary | ICD-10-CM | POA: Diagnosis not present

## 2023-08-02 ENCOUNTER — Ambulatory Visit: Payer: Medicare PPO | Admitting: Family Medicine

## 2023-08-02 ENCOUNTER — Encounter: Payer: Self-pay | Admitting: Family Medicine

## 2023-08-02 VITALS — BP 139/78 | HR 80 | Temp 97.7°F | Ht 63.5 in | Wt 167.0 lb

## 2023-08-02 DIAGNOSIS — I350 Nonrheumatic aortic (valve) stenosis: Secondary | ICD-10-CM

## 2023-08-02 DIAGNOSIS — Z7984 Long term (current) use of oral hypoglycemic drugs: Secondary | ICD-10-CM

## 2023-08-02 DIAGNOSIS — E785 Hyperlipidemia, unspecified: Secondary | ICD-10-CM | POA: Diagnosis not present

## 2023-08-02 DIAGNOSIS — I10 Essential (primary) hypertension: Secondary | ICD-10-CM | POA: Diagnosis not present

## 2023-08-02 DIAGNOSIS — G4709 Other insomnia: Secondary | ICD-10-CM | POA: Diagnosis not present

## 2023-08-02 DIAGNOSIS — E1169 Type 2 diabetes mellitus with other specified complication: Secondary | ICD-10-CM

## 2023-08-02 DIAGNOSIS — E119 Type 2 diabetes mellitus without complications: Secondary | ICD-10-CM

## 2023-08-02 MED ORDER — VALSARTAN 160 MG PO TABS: 240.0000 mg | ORAL_TABLET | Freq: Every day | ORAL | 6 refills | Status: DC

## 2023-08-02 MED ORDER — GLIPIZIDE 5 MG PO TABS
ORAL_TABLET | ORAL | 1 refills | Status: AC
Start: 2023-08-02 — End: ?

## 2023-08-02 MED ORDER — RYBELSUS 14 MG PO TABS: 14.0000 mg | ORAL_TABLET | Freq: Every day | ORAL | 6 refills | Status: DC

## 2023-08-02 MED ORDER — ROSUVASTATIN CALCIUM 40 MG PO TABS: 40.0000 mg | ORAL_TABLET | Freq: Every day | ORAL | 1 refills | Status: DC

## 2023-08-02 MED ORDER — AMLODIPINE BESYLATE 10 MG PO TABS: 10.0000 mg | ORAL_TABLET | Freq: Every day | ORAL | 1 refills | Status: DC

## 2023-08-02 NOTE — Progress Notes (Signed)
   Subjective:    Patient ID: Christie [name], female    DOB: [birth year], 77 y.o.   MRN: [id number]  Diabetes She presents for her follow-up diabetic visit. She has type 2 diabetes mellitus. Current diabetic treatments: glipizide, rybelsus 14 mg.   Takes glipizide 2 - 5 mg bid  Patient for blood pressure check up.  The patient does have hypertension.   Patient relates dietary measures try to minimize salt The importance of healthy diet and activity were discussed Patient relates compliance  Patient here for follow-up regarding cholesterol.    Patient relates taking medication on a regular basis Denies problems with medication Importance of dietary measures discussed Regular lab work regarding lipid and liver was checked and if needing additional labs was appropriately ordered  The patient was seen today as part of a comprehensive diabetic check up. Patient has diabetes Patient relates good compliance with taking the medication. We discussed their diet and exercise activities  We also discussed the importance of notifying us if any excessively high glucoses or low sugars.   Results for orders placed or performed in visit on 05/10/23  Basic metabolic panel  Result Value Ref Range   Glucose 113 (H) 70 - 99 mg/dL   BUN 10 8 - 27 mg/dL   Creatinine, Ser 4.25 (L) 0.57 - 1.00 mg/dL   eGFR 96 >95 GL/OVF/6.43   BUN/Creatinine Ratio 21 12 - 28   Sodium 143 134 - 144 mmol/L   Potassium 4.0 3.5 - 5.2 mmol/L   Chloride 105 96 - 106 mmol/L   CO2 23 20 - 29 mmol/L   Calcium 9.4 8.7 - 10.3 mg/dL  Lipid panel  Result Value Ref Range   Cholesterol, Total 134 100 - 199 mg/dL   Triglycerides 329 0 - 149 mg/dL   HDL 59 >51 mg/dL   VLDL Cholesterol Cal 23 5 - 40 mg/dL   LDL Chol Calc (NIH) 52 0 - 99 mg/dL   Chol/HDL Ratio 2.3 0.0 - 4.4 ratio  Hepatic function panel  Result Value Ref Range   Total Protein 6.4 6.0 - 8.5 g/dL   Albumin 4.5 3.8 - 4.8 g/dL   Bilirubin Total 0.5 0.0 - 1.2 mg/dL    Bilirubin, Direct 8.84 0.00 - 0.40 mg/dL   Alkaline Phosphatase 91 44 - 121 IU/L   AST 24 0 - 40 IU/L   ALT 26 0 - 32 IU/L  Hemoglobin A1c  Result Value Ref Range   Hgb A1c MFr Bld 6.2 (H) 4.8 - 5.6 %   Est. average glucose Bld gHb Est-mCnc 131 mg/dL     Review of Systems     Objective:   Physical Exam  General-in no acute distress Eyes-no discharge Lungs-respiratory rate normal, CTA CV-murmur noted stable,RRR Extremities skin warm dry no edema Neuro grossly normal Behavior normal, alert       Assessment & Plan:  1. Essential hypertension, benign Blood pressure good continue current measures  2. Hyperlipidemia associated with type 2 diabetes mellitus (HCC) Lipids look very good continue current measures follow-up 6 months  3. Diabetes mellitus without complication (HCC) A1c very good control back down on the glipizide to 1-1/2 tablet twice daily  4. Other insomnia May continue nerve pill at nighttime to help with sleep caution drowsiness  5. Aortic stenosis, mild Stable echo earlier this year  Follow-up within 6 months

## 2023-08-25 ENCOUNTER — Other Ambulatory Visit: Payer: Self-pay | Admitting: Family Medicine

## 2023-09-17 ENCOUNTER — Encounter: Payer: Self-pay | Admitting: Family Medicine

## 2023-09-18 NOTE — Telephone Encounter (Signed)
Nurses I read Carol's message Also-there are many different factors that can go into causing her blood pressure to be elevated. I assume she is taking all of her medications Please look at my schedule and see if we can find a open same-day slot for her in the near future Please try to get her in this week with myself or with Eber Jones or at the very latest next week Also have the lowest bring her blood pressure cuff with her when she comes

## 2023-09-19 ENCOUNTER — Other Ambulatory Visit: Payer: Self-pay

## 2023-09-19 ENCOUNTER — Telehealth: Payer: Self-pay

## 2023-09-19 ENCOUNTER — Ambulatory Visit: Payer: Medicare PPO | Admitting: Family Medicine

## 2023-09-19 VITALS — BP 120/64 | HR 70 | Temp 97.3°F | Ht 63.5 in | Wt 167.0 lb

## 2023-09-19 DIAGNOSIS — E119 Type 2 diabetes mellitus without complications: Secondary | ICD-10-CM

## 2023-09-19 DIAGNOSIS — I10 Essential (primary) hypertension: Secondary | ICD-10-CM

## 2023-09-19 MED ORDER — GLIPIZIDE 5 MG PO TABS
ORAL_TABLET | ORAL | 1 refills | Status: DC
Start: 2023-09-19 — End: 2023-09-19

## 2023-09-19 MED ORDER — GLIPIZIDE 5 MG PO TABS
ORAL_TABLET | ORAL | 1 refills | Status: DC
Start: 2023-09-19 — End: 2024-03-07

## 2023-09-19 NOTE — Telephone Encounter (Signed)
Prescription Request  09/19/2023  LOV: Visit date not found  What is the name of the medication or equipment? glipiZIDE (GLUCOTROL) 5 MG tablet   Have you contacted your pharmacy to request a refill? Yes   Which pharmacy would you like this sent to? [facility]   Patient notified that their request is being sent to the clinical staff for review and that they should receive a response within 2 business days.   Please advise at Mobile [phone] (mobile)

## 2023-09-19 NOTE — Telephone Encounter (Signed)
Patient had visit with Dr Adriana Simas today.

## 2023-09-19 NOTE — Progress Notes (Signed)
Subjective:  Patient ID: Christie [name], female    DOB: [birth year]  Age: 77 y.o. MRN: [id number]  CC: Chief Complaint  Patient presents with   high blood pressures    Dizziness, light headed since Saturday night     HPI:  77 year old female presents for evaluation of the above.  Over the past couple days, patient states that she has had some dizziness.  She has had some high blood pressure readings.  This got her very concerned.  She is currently on amlodipine 10 mg daily, and valsartan to 40 mg daily.  No chest pain or shortness of breath.  No other associated symptoms.  She is feeling well this morning.  Patient Active Problem List   Diagnosis Date Noted   IBS (irritable bowel syndrome) 05/10/2021   Hyperlipidemia associated with type 2 diabetes mellitus (HCC) 02/25/2020   Lumbar spine scoliosis 07/04/2017   Aortic stenosis, mild 11/14/2016   Elevated alkaline phosphatase level 05/17/2015   Urethral diverticulum 10/14/2014   Lichen sclerosus et atrophicus 10/14/2014   Insomnia 09/29/2014   Depression 04/04/2014   Essential hypertension, benign 08/13/2013   History of kidney stones 08/13/2013   Elevated liver enzymes 08/13/2013    Social Hx   Social History   Socioeconomic History   Marital status: Widowed    Spouse name: Not on file   Number of children: Not on file   Years of education: Not on file   Highest education level: Not on file  Occupational History   Not on file  Tobacco Use   Smoking status: Never   Smokeless tobacco: Never  Vaping Use   Vaping status: Never Used  Substance and Sexual Activity   Alcohol use: No    Alcohol/week: 0.0 standard drinks of alcohol   Drug use: No   Sexual activity: Not on file  Other Topics Concern   Not on file  Social History Narrative   Not on file   Social Determinants of Health   Financial Resource Strain: Not on file  Food Insecurity: Not on file  Transportation Needs: Not on file  Physical Activity: Not on  file  Stress: Not on file  Social Connections: Not on file    Review of Systems Per HPI  Objective:  BP 120/64   Pulse 70   Temp (!) 97.3 F (36.3 C)   Ht 5' 3.5" (1.613 m)   Wt 167 lb (75.8 kg)   SpO2 97%   BMI 29.12 kg/m      09/19/2023    8:30 AM 08/02/2023   10:10 AM 05/26/2023   10:02 AM  BP/Weight  Systolic BP 120 139 136  Diastolic BP 64 78 79  Wt. (Lbs) 167 167 169.4  BMI 29.12 kg/m2 29.12 kg/m2 29.54 kg/m2    Physical Exam Vitals and nursing note reviewed.  Constitutional:      General: She is not in acute distress.    Appearance: Normal appearance.  Cardiovascular:     Rate and Rhythm: Normal rate and regular rhythm.     Heart sounds: Murmur heard.  Pulmonary:     Effort: Pulmonary effort is normal.     Breath sounds: Normal breath sounds. No wheezing, rhonchi or rales.  Neurological:     Mental Status: She is alert.  Psychiatric:        Mood and Affect: Mood normal.        Behavior: Behavior normal.     Lab Results  Component Value Date  WBC 9.7 07/11/2022   HGB 15.6 07/11/2022   HCT 47.9 (H) 07/11/2022   PLT 163 07/11/2022   GLUCOSE 113 (H) 07/31/2023   CHOL 134 07/31/2023   TRIG 131 07/31/2023   HDL 59 07/31/2023   LDLCALC 52 07/31/2023   ALT 26 07/31/2023   AST 24 07/31/2023   NA 143 07/31/2023   K 4.0 07/31/2023   CL 105 07/31/2023   CREATININE 0.48 (L) 07/31/2023   BUN 10 07/31/2023   CO2 23 07/31/2023   HGBA1C 6.2 (H) 07/31/2023   MICROALBUR 1.4 09/23/2014     Assessment & Plan:   Problem List Items Addressed This Visit       Cardiovascular and Mediastinum   Essential hypertension, benign - Primary    Blood pressure is well-controlled today.  Advised to check her blood pressure regulate home.  Discussed the proper way to check her blood pressure.  Continue amlodipine and valsartan.       Follow-up:  1 month  Tysha Grismore DO [facility]

## 2023-09-19 NOTE — Assessment & Plan Note (Signed)
Blood pressure is well-controlled today.  Advised to check her blood pressure regulate home.  Discussed the proper way to check her blood pressure.  Continue amlodipine and valsartan.

## 2023-09-19 NOTE — Patient Instructions (Signed)
Monitor BP as we discussed.  If BP is persistently elevated, we will increase Valsartan to 320.  Follow up with Dr. Lorin Picket in ~ 1 month or sooner if needed.

## 2023-09-20 ENCOUNTER — Other Ambulatory Visit: Payer: Self-pay | Admitting: Family Medicine

## 2023-10-11 ENCOUNTER — Encounter (INDEPENDENT_AMBULATORY_CARE_PROVIDER_SITE_OTHER): Payer: Medicare PPO | Admitting: Ophthalmology

## 2023-10-11 DIAGNOSIS — I10 Essential (primary) hypertension: Secondary | ICD-10-CM | POA: Diagnosis not present

## 2023-10-11 DIAGNOSIS — H35033 Hypertensive retinopathy, bilateral: Secondary | ICD-10-CM

## 2023-10-11 DIAGNOSIS — H43812 Vitreous degeneration, left eye: Secondary | ICD-10-CM | POA: Diagnosis not present

## 2023-10-11 DIAGNOSIS — H35341 Macular cyst, hole, or pseudohole, right eye: Secondary | ICD-10-CM | POA: Diagnosis not present

## 2023-11-02 ENCOUNTER — Telehealth: Payer: Self-pay | Admitting: Family Medicine

## 2023-11-02 MED ORDER — BLOOD GLUCOSE TEST STRIPS 333 VI STRP: ORAL_STRIP | 0 refills | Status: DC

## 2023-11-02 NOTE — Telephone Encounter (Signed)
Refill on Accu check guide test strips [employer]

## 2023-11-02 NOTE — Telephone Encounter (Signed)
Prescription sent to pharmacy.

## 2023-11-09 ENCOUNTER — Other Ambulatory Visit: Payer: Self-pay | Admitting: Family Medicine

## 2023-11-14 ENCOUNTER — Ambulatory Visit (INDEPENDENT_AMBULATORY_CARE_PROVIDER_SITE_OTHER): Payer: Medicare PPO | Admitting: *Deleted

## 2023-11-14 DIAGNOSIS — Z23 Encounter for immunization: Secondary | ICD-10-CM

## 2023-11-20 ENCOUNTER — Other Ambulatory Visit: Payer: Self-pay | Admitting: Family Medicine

## 2023-11-20 ENCOUNTER — Ambulatory Visit (INDEPENDENT_AMBULATORY_CARE_PROVIDER_SITE_OTHER): Payer: Medicare PPO | Admitting: Family Medicine

## 2023-11-20 ENCOUNTER — Encounter: Payer: Self-pay | Admitting: Family Medicine

## 2023-11-20 VITALS — BP 138/66 | HR 72 | Temp 98.2°F | Ht 63.5 in | Wt 169.8 lb

## 2023-11-20 DIAGNOSIS — E1169 Type 2 diabetes mellitus with other specified complication: Secondary | ICD-10-CM

## 2023-11-20 DIAGNOSIS — E785 Hyperlipidemia, unspecified: Secondary | ICD-10-CM

## 2023-11-20 DIAGNOSIS — I350 Nonrheumatic aortic (valve) stenosis: Secondary | ICD-10-CM | POA: Diagnosis not present

## 2023-11-20 DIAGNOSIS — Z0001 Encounter for general adult medical examination with abnormal findings: Secondary | ICD-10-CM

## 2023-11-20 DIAGNOSIS — Z79899 Other long term (current) drug therapy: Secondary | ICD-10-CM | POA: Diagnosis not present

## 2023-11-20 DIAGNOSIS — M816 Localized osteoporosis [Lequesne]: Secondary | ICD-10-CM

## 2023-11-20 DIAGNOSIS — I10 Essential (primary) hypertension: Secondary | ICD-10-CM

## 2023-11-20 DIAGNOSIS — Z0000 Encounter for general adult medical examination without abnormal findings: Secondary | ICD-10-CM

## 2023-11-20 DIAGNOSIS — E119 Type 2 diabetes mellitus without complications: Secondary | ICD-10-CM

## 2023-11-20 NOTE — Progress Notes (Signed)
   Subjective:    Patient ID: Christie [name], female    DOB: [birth year], 77 y.o.   MRN: [id number]  HPI The patient comes in today for a wellness visit. Patient tries to eat relatively healthy Glucoses been in a good range She states her blood pressure has been higher than what it has been recently Denies any type of chest tightness pressure pain shortness of breath She does take her standard medicines on a regular basis No recent falls or injuries Does have left ear feeling full at times and other times like there is water in it   A review of their health history was completed.  A review of medications was also completed.  Any needed refills; No  Eating habits: Good  Falls/  MVA accidents in past few months: No  Regular exercise: Moderate  Specialist pt sees on regular basis: Eye Dr, Cardio  Preventative health issues were discussed.   Additional concerns: Would like left ear looked at, feels as if water is in there. Would like pelvic exam   Review of Systems     Objective:   Physical Exam General-in no acute distress Eyes-no discharge Lungs-respiratory rate normal, CTA CV-no murmurs,RRR Extremities skin warm dry no edema Neuro grossly normal Behavior normal, alert Breast exam bilateral normal Pelvic exam stable Has a diverticulum within the vaginal area that is soft does not appear to be inflamed or irritated       Assessment & Plan:  1. Essential hypertension, benign HTN- patient seen for follow-up regarding HTN.   Diet, medication compliance, appropriate labs and refills were completed.   Importance of keeping blood pressure under good control to lessen the risk of complications discussed Regular follow-up visits discussed  I checked the blood pressure on several occasions best reading 138/66 given this reading I would not recommend adjusting medication currently - Microalbumin/Creatinine Ratio, Urine  2. Diabetes mellitus without complication  (HCC) Previous A1c looks good check lab work before next visit  3. Aortic stenosis, mild She will be seen her cardiologist in February more than likely they will repeat echo  4. Hyperlipidemia associated with type 2 diabetes mellitus (HCC) Continue medication healthy diet keep A1c below 7 and keep LDL below 70  5. Well adult exam Adult wellness-complete.wellness physical was conducted today. Importance of diet and exercise were discussed in detail.  Importance of stress reduction and healthy living were discussed.  In addition to this a discussion regarding safety was also covered.  We also reviewed over immunizations and gave recommendations regarding current immunization needed for age.   In addition to this additional areas were also touched on including: Preventative health exams needed:  Colonoscopy not indicated  Patient was advised yearly wellness exam   6. Encounter for subsequent annual wellness visit (AWV) in Medicare patient Healthy diet recommended Patient has not had any falls recently Energy level doing well Up-to-date on preventative Bone density next year   7. Localized osteoporosis without current pathological fracture Continue Fosamax once a week if significant heartburn or dysphagia stop medication

## 2023-11-20 NOTE — Addendum Note (Signed)
Addended byTrinidad Curet on: 11/20/2023 02:19 PM   Modules accepted: Orders

## 2023-11-22 LAB — MICROALBUMIN / CREATININE URINE RATIO
Creatinine, Urine: 120.2 mg/dL
Microalb/Creat Ratio: 33 mg/g{creat} — ABNORMAL HIGH (ref 0–29)
Microalbumin, Urine: 39.3 ug/mL

## 2023-11-22 LAB — SPECIMEN STATUS REPORT

## 2023-12-14 ENCOUNTER — Encounter (INDEPENDENT_AMBULATORY_CARE_PROVIDER_SITE_OTHER): Payer: Self-pay

## 2023-12-14 ENCOUNTER — Ambulatory Visit (INDEPENDENT_AMBULATORY_CARE_PROVIDER_SITE_OTHER): Payer: Medicare PPO | Admitting: Otolaryngology

## 2023-12-14 VITALS — BP 131/77 | HR 79 | Ht 63.0 in | Wt 170.0 lb

## 2023-12-14 DIAGNOSIS — H903 Sensorineural hearing loss, bilateral: Secondary | ICD-10-CM

## 2023-12-14 DIAGNOSIS — H9313 Tinnitus, bilateral: Secondary | ICD-10-CM

## 2023-12-14 DIAGNOSIS — H6983 Other specified disorders of Eustachian tube, bilateral: Secondary | ICD-10-CM | POA: Diagnosis not present

## 2023-12-14 MED ORDER — FLUTICASONE PROPIONATE 50 MCG/ACT NA SUSP: 2.0000 | Freq: Every day | NASAL | 0 refills | Status: AC

## 2023-12-16 DIAGNOSIS — H903 Sensorineural hearing loss, bilateral: Secondary | ICD-10-CM | POA: Insufficient documentation

## 2023-12-16 DIAGNOSIS — H9313 Tinnitus, bilateral: Secondary | ICD-10-CM | POA: Insufficient documentation

## 2023-12-16 DIAGNOSIS — H6983 Other specified disorders of Eustachian tube, bilateral: Secondary | ICD-10-CM | POA: Insufficient documentation

## 2023-12-16 NOTE — Progress Notes (Unsigned)
Patient ID: Christie [name], female   DOB: November 11, 1946, 77 y.o.   MRN: [id number]  Follow-up: Hearing loss

## 2023-12-28 ENCOUNTER — Other Ambulatory Visit: Payer: Self-pay | Admitting: Family Medicine

## 2024-01-12 ENCOUNTER — Telehealth: Payer: Self-pay

## 2024-01-12 NOTE — Telephone Encounter (Signed)
Reason for CRM: The patient needs a prior authorization for her Semaglutide (RYBELSUS) 14 MG TABS.

## 2024-01-16 NOTE — Telephone Encounter (Signed)
Never receiver information concerning need for PA- contacted pharmacy and they stated the medication did not need PA and has been filled with a $40 copay. Patient notified

## 2024-01-26 ENCOUNTER — Telehealth (INDEPENDENT_AMBULATORY_CARE_PROVIDER_SITE_OTHER): Payer: Self-pay | Admitting: Otolaryngology

## 2024-01-26 NOTE — Telephone Encounter (Signed)
 Reminder Call: Date: 01/29/2024 Status: Sch  Time: 4:10 PM [address] [place], [place] [address]  PHONE CONFIRM

## 2024-01-29 ENCOUNTER — Ambulatory Visit (INDEPENDENT_AMBULATORY_CARE_PROVIDER_SITE_OTHER): Payer: Medicare PPO

## 2024-01-29 ENCOUNTER — Encounter: Payer: Self-pay | Admitting: Family Medicine

## 2024-01-29 ENCOUNTER — Encounter (INDEPENDENT_AMBULATORY_CARE_PROVIDER_SITE_OTHER): Payer: Self-pay

## 2024-01-29 VITALS — BP 173/79 | HR 75 | Ht 63.5 in | Wt 170.0 lb

## 2024-01-29 DIAGNOSIS — H6980 Other specified disorders of Eustachian tube, unspecified ear: Secondary | ICD-10-CM | POA: Diagnosis not present

## 2024-01-29 DIAGNOSIS — R42 Dizziness and giddiness: Secondary | ICD-10-CM

## 2024-01-29 DIAGNOSIS — H9313 Tinnitus, bilateral: Secondary | ICD-10-CM

## 2024-01-29 DIAGNOSIS — H6983 Other specified disorders of Eustachian tube, bilateral: Secondary | ICD-10-CM

## 2024-01-29 DIAGNOSIS — H903 Sensorineural hearing loss, bilateral: Secondary | ICD-10-CM

## 2024-01-30 ENCOUNTER — Encounter: Payer: Self-pay | Admitting: Family Medicine

## 2024-01-30 ENCOUNTER — Ambulatory Visit: Payer: Self-pay | Admitting: Family Medicine

## 2024-01-30 DIAGNOSIS — R42 Dizziness and giddiness: Secondary | ICD-10-CM | POA: Insufficient documentation

## 2024-01-30 NOTE — Telephone Encounter (Signed)
Nurses Blood pressure is more elevated than what I would like to see Please get Christie [name] an appointment with me on the same day for this week or with Toni Amend please thank you

## 2024-01-30 NOTE — Progress Notes (Signed)
Patient ID: Christie [name], female   DOB: [birth year], 78 y.o.   MRN: [id number]  New complaint: Recurrent dizziness Follow-up: Hearing loss, eustachian tube dysfunction  HPI: The patient is a 78 year old female who returns today with her daughter.  The patient was previously seen for her progressive hearing loss, tinnitus, and eustachian tube dysfunction.  She has a history of bilateral otosclerosis.  She underwent bilateral stapedectomy surgery by Dr. Dorma Russell.  Her previous audiogram showed bilateral high-frequency sensorineural hearing loss.  No significant conductive hearing loss was noted.  She also has a history of bilateral eustachian tube dysfunction with clogging sensation in her ears.  She was treated with Flonase nasal spray and Valsalva exercise.  The patient returns today reporting improvement in her eustachian tube dysfunction.  She is now able to auto insufflate her middle ear spaces.  She denies any change in her hearing.  However, she has a new complaint of recurrent dizziness.  Over the past few months, she has noted intermittent brief spinning vertigo that lasts for several seconds.  The patient's daughter is a physical therapist.  The daughter reports that the patient's Dix-Hallpike maneuver was positive.  The patient was treated with multiple Epley maneuvers.  Currently the patient reports occasional balance difficulty.  She denies any otalgia, otorrhea, or vertigo at this time.  Exam: General: Communicates without difficulty, well nourished, no acute distress. Head: Normocephalic, no evidence injury, no tenderness, facial buttresses intact without stepoff. Face/sinus: No tenderness to palpation and percussion. Facial movement is normal and symmetric. Eyes: PERRL, EOMI. No scleral icterus, conjunctivae clear. Neuro: CN II exam reveals vision grossly intact.  No nystagmus at any point of gaze. Ears: Auricles well formed without lesions.  Ear canals are intact without mass or lesion.  No erythema  or edema is appreciated.  The TMs are intact without fluid. Nose: External evaluation reveals normal support and skin without lesions.  Dorsum is intact.  Anterior rhinoscopy reveals congested mucosa over anterior aspect of inferior turbinates and intact septum.  No purulence noted. Oral:  Oral cavity and oropharynx are intact, symmetric, without erythema or edema.  Mucosa is moist without lesions. Neck: Full range of motion without pain.  There is no significant lymphadenopathy.  No masses palpable.  Thyroid bed within normal limits to palpation.  Parotid glands and submandibular glands equal bilaterally without mass.  Trachea is midline. Neuro:  CN 2-12 grossly intact. Vestibular: No nystagmus at any point of gaze. Dix Hallpike negative. Vestibular: There is no nystagmus with pneumatic pressure on either tympanic membrane or Valsalva. The cerebellar examination is unremarkable.    Assessment: 1.  Clinical improvement in her eustachian tube dysfunction. 2.  Subjectively stable bilateral high-frequency sensorineural hearing loss and tinnitus. 3.  The patient's ear canals, tympanic membranes, and middle ear spaces are normal.  No middle ear effusion is noted.  Her Dix-Hallpike maneuver is negative. 4.  Recurrent dizziness, secondary to transient benign paroxysmal positional vertigo.  Plan: 1.  The physical exam findings are reviewed with the patient. 2.  The pathophysiology of dizziness and BPPV are extensively discussed with the patient and her daughter.  Other possible differential diagnoses are also discussed.  Questions are invited and answered. 3.  Continue with Flonase nasal spray and Valsalva exercise as needed to treat her eustachian tube dysfunction. 4.  Continue the use of her hearing aids. 5.  The patient will return for reevaluation in 6 months, sooner if needed.

## 2024-01-30 NOTE — Telephone Encounter (Signed)
Patient scheduled appointment tomorrow with Dr Lorin Picket

## 2024-01-30 NOTE — Telephone Encounter (Signed)
FYI

## 2024-01-30 NOTE — Telephone Encounter (Signed)
  Chief Complaint: Hypertension Symptoms: High blood pressure with occasional dizziness off and on for weeks  Frequency: a few weeks for the off and on dizziness but the blood pressure being high discovered recently Pertinent Negatives: Patient denies blurred vision, chest pain, difficulty breathing, weakness Disposition: [] ED /[] Urgent Care (no appt availability in office) / [x] Appointment(In office/virtual)/ []  [facility]/ [] Home Care/ [] Refused Recommended Disposition /[] [facility]/ []  Follow-up with PCP Additional Notes: Patient advised a few weeks ago she woke up one day she was very dizzy.  She said it comes and goes--in the morning or at night--it differs.  Patient states she had her ears checked out yesterday at another appointment and her blood pressure was up at that visit 173/80.  Daughter took her blood pressure two days ago and it was 156/70. Patient states she is able to drive and eat well.  Patient is out at this time not at home during triage and speaking well in full sentences.  She denies any symptoms at this time but is just worried about her blood pressure being high lately.  She states that she takes her medication as prescribed and has not missed any doses. Patient denies blurred vision, chest pain, difficulty breathing, weakness. Appointment is made for tomorrow 01/30/2024 at 3:30 pm with her PCP Dr. Lilyan Punt. Patient is advised to go to the emergency room if any symptoms do worsen and she is given the home care advice of things to look out that would warrant her going to the emergency room.  She is also advised that if anything concerns her before this appointment to go ahead to the emergency room to get checked out.  Patient verbalized understanding.   Reason for Disposition  Systolic BP  >= 160 OR Diastolic >= 100  Answer Assessment - Initial Assessment Questions 1. BLOOD PRESSURE: "What is the blood pressure?" "Did you take at least two  measurements 5 minutes apart?"     Yesterday at an appt was 173/80 2. ONSET: "When did you take your blood pressure?"     Yesterday it was 173/80  3. HOW: "How did you take your blood pressure?" (e.g., automatic home BP monitor, visiting nurse)     Dr appt 4. HISTORY: "Do you have a history of high blood pressure?"     yes 5. MEDICINES: "Are you taking any medicines for blood pressure?" "Have you missed any doses recently?"     Yes--normally it isnt this high 6. OTHER SYMPTOMS: "Do you have any symptoms?" (e.g., blurred vision, chest pain, difficulty breathing, headache, weakness)     Headache sometimes  Protocols used: Blood Pressure - High-A-AH

## 2024-01-31 ENCOUNTER — Ambulatory Visit (INDEPENDENT_AMBULATORY_CARE_PROVIDER_SITE_OTHER): Payer: Medicare PPO | Admitting: Family Medicine

## 2024-01-31 ENCOUNTER — Encounter: Payer: Self-pay | Admitting: Family Medicine

## 2024-01-31 VITALS — BP 150/78 | HR 74 | Temp 97.8°F | Ht 63.5 in | Wt 173.0 lb

## 2024-01-31 DIAGNOSIS — E119 Type 2 diabetes mellitus without complications: Secondary | ICD-10-CM

## 2024-01-31 DIAGNOSIS — E1169 Type 2 diabetes mellitus with other specified complication: Secondary | ICD-10-CM | POA: Diagnosis not present

## 2024-01-31 DIAGNOSIS — I10 Essential (primary) hypertension: Secondary | ICD-10-CM

## 2024-01-31 NOTE — Progress Notes (Signed)
   Subjective:    Patient ID: Christie [name], female    DOB: [birth year], 78 y.o.   MRN: [id number]  HPI Very nice patient Intermittent dizziness he describes as feeling unsteady Occasionally has vertigo symptoms In addition to this patient with elevated blood pressure on various readings both at ENT office as well as at home She brings her cuff in She denies any severe headaches chest pain shortness of breath Relates relatively healthy eating habits Taking her medicines as directed    Review of Systems     Objective:   Physical Exam  General-in no acute distress Eyes-no discharge Lungs-respiratory rate normal, CTA CV-no murmurs,RRR Extremities skin warm dry no edema Neuro grossly normal Behavior normal, alert       Assessment & Plan:  1. Essential hypertension, benign (Primary) Subpar control Blood pressure checked with her machine essentially in the range of 180/84 Blood pressure checked with our reading much improved It is still elevated compared to where we would like to see if We did discuss various options including healthier eating regular activity versus low-dose HCTZ versus increasing valsartan Previous use of HCTZ at 25 mg caused blood pressure to bottom out Based on all this shared discussion we will go up on valsartan 160 mg twice daily She will do her lab work in 7 to 10 days She will follow-up within 2 weeks  2. Diabetes mellitus without complication (HCC) She will do the best she can a healthy diet regular activity she will do her lab work before next follow-up visit

## 2024-02-05 ENCOUNTER — Ambulatory Visit: Payer: Medicare PPO | Admitting: Cardiovascular Disease

## 2024-02-06 DIAGNOSIS — Z1231 Encounter for screening mammogram for malignant neoplasm of breast: Secondary | ICD-10-CM | POA: Diagnosis not present

## 2024-02-06 LAB — HM MAMMOGRAPHY

## 2024-02-08 ENCOUNTER — Other Ambulatory Visit: Payer: Self-pay | Admitting: Family Medicine

## 2024-02-12 DIAGNOSIS — Z79899 Other long term (current) drug therapy: Secondary | ICD-10-CM | POA: Diagnosis not present

## 2024-02-12 DIAGNOSIS — E785 Hyperlipidemia, unspecified: Secondary | ICD-10-CM | POA: Diagnosis not present

## 2024-02-12 DIAGNOSIS — E119 Type 2 diabetes mellitus without complications: Secondary | ICD-10-CM | POA: Diagnosis not present

## 2024-02-12 DIAGNOSIS — E1169 Type 2 diabetes mellitus with other specified complication: Secondary | ICD-10-CM | POA: Diagnosis not present

## 2024-02-13 ENCOUNTER — Encounter: Payer: Self-pay | Admitting: Family Medicine

## 2024-02-13 LAB — BASIC METABOLIC PANEL
BUN/Creatinine Ratio: 22 (ref 12–28)
BUN: 12 mg/dL (ref 8–27)
CO2: 23 mmol/L (ref 20–29)
Calcium: 9.2 mg/dL (ref 8.7–10.3)
Chloride: 105 mmol/L (ref 96–106)
Creatinine, Ser: 0.54 mg/dL — ABNORMAL LOW (ref 0.57–1.00)
Glucose: 135 mg/dL — ABNORMAL HIGH (ref 70–99)
Potassium: 3.7 mmol/L (ref 3.5–5.2)
Sodium: 144 mmol/L (ref 134–144)
eGFR: 94 mL/min/{1.73_m2} (ref >59)

## 2024-02-13 LAB — LIPID PANEL
Chol/HDL Ratio: 2.2 {ratio} (ref 0.0–4.4)
Cholesterol, Total: 149 mg/dL (ref 100–199)
HDL: 67 mg/dL (ref >39)
LDL Chol Calc (NIH): 60 mg/dL (ref 0–99)
Triglycerides: 126 mg/dL (ref 0–149)
VLDL Cholesterol Cal: 22 mg/dL (ref 5–40)

## 2024-02-13 LAB — HEMOGLOBIN A1C
Est. average glucose Bld gHb Est-mCnc: 151 mg/dL
Hgb A1c MFr Bld: 6.9 % — ABNORMAL HIGH (ref 4.8–5.6)

## 2024-02-13 LAB — HEPATIC FUNCTION PANEL
ALT: 25 [IU]/L (ref 0–32)
AST: 22 [IU]/L (ref 0–40)
Albumin: 4.6 g/dL (ref 3.8–4.8)
Alkaline Phosphatase: 83 [IU]/L (ref 44–121)
Bilirubin Total: 0.5 mg/dL (ref 0.0–1.2)
Bilirubin, Direct: 0.2 mg/dL (ref 0.00–0.40)
Total Protein: 6.8 g/dL (ref 6.0–8.5)

## 2024-02-13 NOTE — Progress Notes (Signed)
 CARDIOLOGY CONSULT NOTE       Patient ID: Christie [name] MRN: [id number] DOB/AGE: [birth year] 80 y.o.  Admit date: (Not on file) Referring Physician: Gerda Diss Primary Physician: Babs Sciara, MD Primary Cardiologist: Eden Emms   HPI:  78 y.o. referred by Dr Gerda Diss 10/27/20 for mixed aortic valve disease. I reviewed her echo from 09/18/20 and mean gradient only 12 mmHg peak 23 mmHg DVI 0.56 and AVA 1.3 cm2. Read as mild AS and mild AR with EF 60-65% Compared to echo done 11/14/16 not much change when mean gradient was 11 and peak 21 mmHg.  She has a history of HTN, DM, and hyperlipidemia on appropriate meds  Of note she had severe abdominal pain 10/12/20 and was referred to GI Abdominal CT was benign as was her CBC and Cr   She is a long standing diabetic and has some coronary calcium on CT but no history of clinical angina. When I saw her in 2017 suggested a myovue study which was never done   She is widowed last 13 years Still sad about it married 47 years Has daughter and 2 grand children in town that look in on her   Myovue 11/17/20 Normal no ischemia EF 66%  TTE 12/22/22 EF 60-65% mild AR mild AS mean gradient 14 peak 26 mmHg AVA 1.3 DVI 0.51   BP has been elevated meds changed by [name] He indicates diuretics "bottomed her pressure"   On valsartan and norvasc now   Repeat BP by me 150/70 mmHg She has some vertigo at times    ROS All other systems reviewed and negative except as noted above  Past Medical History:  Diagnosis Date   Cancer (HCC) 1999   external vulva lesion   Depression    DM (diabetes mellitus) (HCC) 2012   oral medications   Elevated liver enzymes    Hearing deficit    History of kidney stones    Hyperlipidemia    Hyperlipidemia associated with type 2 diabetes mellitus (HCC) 02/25/2020   Hypertension 2000   Spastic colon     Family History  Problem Relation Age of Onset   Diabetes Father    Hypertension Father    Dementia Father    Pancreatic  cancer Mother     Social History   Socioeconomic History   Marital status: Widowed    Spouse name: Not on file   Number of children: Not on file   Years of education: Not on file   Highest education level: Not on file  Occupational History   Not on file  Tobacco Use   Smoking status: Never   Smokeless tobacco: Never  Vaping Use   Vaping status: Never Used  Substance and Sexual Activity   Alcohol use: No    Alcohol/week: 0.0 standard drinks of alcohol   Drug use: No   Sexual activity: Not on file  Other Topics Concern   Not on file  Social History Narrative   Not on file   Social Drivers of Health   Financial Resource Strain: Not on file  Food Insecurity: Not on file  Transportation Needs: Not on file  Physical Activity: Not on file  Stress: Not on file  Social Connections: Not on file  Intimate Partner Violence: Not on file    Past Surgical History:  Procedure Laterality Date   ANTERIOR LAT LUMBAR FUSION Right 07/04/2017   Procedure: Right LUMBAR TWO-THREE LUMBAR THREE-FOUR LUMBAR FOUR-FIVE Anterior lateral lumbar interbody fusion with percutaneous pedicle screws;  Surgeon: Maeola Harman, MD;  Location: [facility] OR;  Service: Neurosurgery;  Laterality: Right;  Right lateral approach    ANTERIOR LATERAL LUMBAR FUSION WITH PERCUTANEOUS SCREW 3 LEVEL N/A 07/04/2017   Procedure: PERCUTANEOUS SCREW THREE LEVEL LUMBAR TWO-THREE LUMBAR THREE-FOUR LUMBAR FOUR-FIVE;  Surgeon: Maeola Harman, MD;  Location: [facility] OR;  Service: Neurosurgery;  Laterality: N/A;  Prone   BACK SURGERY  2004 and 2017   BIOPSY  02/23/2021   Procedure: BIOPSY;  Surgeon: Dolores Frame, MD;  Location: [facility];  Service: Gastroenterology;;   CHOLECYSTECTOMY     COLONOSCOPY  feb 2005   COLONOSCOPY N/A 02/13/2014   Procedure: COLONOSCOPY;  Surgeon: Malissa Hippo, MD;  Location: [facility];  Service: Endoscopy;  Laterality: N/A;  930-moved to 100 Ann notified pt   COLONOSCOPY WITH PROPOFOL N/A  02/23/2021   Procedure: COLONOSCOPY WITH PROPOFOL;  Surgeon: Dolores Frame, MD;  Location: [facility];  Service: Gastroenterology;  Laterality: N/A;  AM   ESOPHAGOGASTRODUODENOSCOPY  09/2005   EYE SURGERY Right    catatract removed   EYE SURGERY Right    macular   surgery    KIDNEY STONE SURGERY     for a staghorn kidney stone   POLYPECTOMY  02/23/2021   Procedure: POLYPECTOMY;  Surgeon: Marguerita Merles, Reuel Boom, MD;  Location: [facility];  Service: Gastroenterology;;   STAPEDECTOMY Bilateral    Right 1991 and Left 1994   TOTAL ABDOMINAL HYSTERECTOMY  1982   vulva cancer surgery  1999      Current Outpatient Medications:    acetaminophen (TYLENOL) 500 MG tablet, Take 1,000 mg by mouth every 6 (six) hours as needed (pain)., Disp: , Rfl:    alendronate (FOSAMAX) 70 MG tablet, Take 1 tablet by mouth every 7 days. Take with a fullglass of water on an empty stomach., Disp: 4 tablet, Rfl: 0   ALPRAZolam (XANAX) 0.5 MG tablet, TAKE 1 TABLET BY MOUTH AT BEDTIME., Disp: 30 tablet, Rfl: 4   amLODipine (NORVASC) 10 MG tablet, TAKE ONE TABLET BY MOUTH ONCE DAILY., Disp: 90 tablet, Rfl: 1   Ascorbic Acid (VITAMIN C) 1000 MG tablet, Take 1,000 mg by mouth daily., Disp: , Rfl:    cholecalciferol (VITAMIN D) 1000 UNITS tablet, Take 1,000 Units by mouth daily., Disp: , Rfl:    dicyclomine (BENTYL) 10 MG capsule, 1 taken 3 times daily as needed for discomfort, Disp: 30 capsule, Rfl: 2   docusate sodium (COLACE) 100 MG capsule, Take 300 mg by mouth at bedtime., Disp: , Rfl:    fluticasone (FLONASE) 50 MCG/ACT nasal spray, Place 2 sprays into both nostrils daily., Disp: 1 g, Rfl: 0   glipiZIDE (GLUCOTROL) 5 MG tablet, TAKE 1-1/2 TABLETS IN THE MORNING AND 1-1/2 TABLETS IN THE EVENING., Disp: 270 tablet, Rfl: 1   Glucose Blood (BLOOD GLUCOSE TEST STRIPS 333) STRP, Use to check blood sugar once daily as directed., Disp: 50 strip, Rfl: 0   Multiple Vitamins-Minerals (MACULAR VITAMIN BENEFIT  PO), Take 1 tablet by mouth in the morning and at bedtime., Disp: , Rfl:    OVER THE COUNTER MEDICATION, IB Guard BID, Disp: , Rfl:    rosuvastatin (CRESTOR) 40 MG tablet, TAKE ONE TABLET BY MOUTH ONCE DAILY., Disp: 90 tablet, Rfl: 1   Semaglutide (RYBELSUS) 14 MG TABS, Take 1 tablet (14 mg total) by mouth daily., Disp: 30 tablet, Rfl: 6   sertraline (ZOLOFT) 50 MG tablet, TAKE (1) TABLET BY MOUTH ONCE DAILY., Disp: 90 tablet,  Rfl: 1   valsartan (DIOVAN) 160 MG tablet, Take 1.5 tablets (240 mg total) by mouth daily. (Patient taking differently: Take 160 mg by mouth 2 (two) times daily.), Disp: 45 tablet, Rfl: 4   zinc gluconate 50 MG tablet, Take 50 mg by mouth daily., Disp: , Rfl:     Physical Exam: Blood pressure (!) 154/70, pulse 76, height 5' 3.5" (1.613 m), weight 171 lb 12.8 oz (77.9 kg), SpO2 97%.    Affect appropriate Elderly female  HEENT: normal Neck supple with no adenopathy JVP normal no bruits no thyromegaly Lungs clear with no wheezing and good diaphragmatic motion Heart:  S1/S2 preserved AS  murmur, no rub, gallop or click PMI normal Abdomen: benighn, BS positve, no tenderness, no AAA no bruit.  No HSM or HJR Distal pulses intact with no bruits No edema Neuro non-focal Skin warm and dry No muscular weakness   Labs:   Lab Results  Component Value Date   WBC 9.7 07/11/2022   HGB 15.6 07/11/2022   HCT 47.9 (H) 07/11/2022   MCV 91 07/11/2022   PLT 163 07/11/2022    No results for input(s): "NA", "K", "CL", "CO2", "BUN", "CREATININE", "CALCIUM", "PROT", "BILITOT", "ALKPHOS", "ALT", "AST", "GLUCOSE" in the last 168 hours.  Invalid input(s): "LABALBU"  No results found for: "CKTOTAL", "CKMB", "CKMBINDEX", "TROPONINI"  Lab Results  Component Value Date   CHOL 149 02/12/2024   CHOL 134 07/31/2023   CHOL 164 03/20/2023   Lab Results  Component Value Date   HDL 67 02/12/2024   HDL 59 07/31/2023   HDL 70 03/20/2023   Lab Results  Component Value Date    LDLCALC 60 02/12/2024   LDLCALC 52 07/31/2023   LDLCALC 72 03/20/2023   Lab Results  Component Value Date   TRIG 126 02/12/2024   TRIG 131 07/31/2023   TRIG 128 03/20/2023   Lab Results  Component Value Date   CHOLHDL 2.2 02/12/2024   CHOLHDL 2.3 07/31/2023   CHOLHDL 2.3 03/20/2023   No results found for: "LDLDIRECT"    Radiology: No results found.   EKG: 02/22/2024 NSR PVC otherwise noral    ASSESSMENT AND PLAN:   1. AVD:  Mild AS/AR normal EF not much change since 2017 on recent TTE done 12/22/2022 observe  2. HTN:  Diovan and norvasc 10 mg started 02/08/24 F/U Dr Gerda Diss  3. DM:  Discussed low carb diet.  Target hemoglobin A1c is 6.5 or less.  Continue current medications. A1c 6.9 02/12/24  4. HLD:  Continue statin labs with primary LDL 60 02/12/24 5. GI:  Resolved pain no pathology on CT 6. CAD:  Risk with long standing DM and age normal myovue 11/17/20 7. Vertigo:  PRN Meclizine called in    F/U in a year with echo for AS/AR     Signed: Charlton Haws 02/22/2024, 2:34 PM

## 2024-02-14 ENCOUNTER — Ambulatory Visit (INDEPENDENT_AMBULATORY_CARE_PROVIDER_SITE_OTHER): Payer: Medicare PPO | Admitting: Family Medicine

## 2024-02-14 ENCOUNTER — Encounter: Payer: Self-pay | Admitting: Family Medicine

## 2024-02-14 VITALS — BP 160/81 | HR 78 | Temp 97.8°F | Ht 63.5 in | Wt 171.0 lb

## 2024-02-14 DIAGNOSIS — E1169 Type 2 diabetes mellitus with other specified complication: Secondary | ICD-10-CM | POA: Diagnosis not present

## 2024-02-14 DIAGNOSIS — I10 Essential (primary) hypertension: Secondary | ICD-10-CM

## 2024-02-14 DIAGNOSIS — E785 Hyperlipidemia, unspecified: Secondary | ICD-10-CM | POA: Diagnosis not present

## 2024-02-14 DIAGNOSIS — R42 Dizziness and giddiness: Secondary | ICD-10-CM

## 2024-02-14 DIAGNOSIS — I350 Nonrheumatic aortic (valve) stenosis: Secondary | ICD-10-CM

## 2024-02-14 DIAGNOSIS — E119 Type 2 diabetes mellitus without complications: Secondary | ICD-10-CM | POA: Diagnosis not present

## 2024-02-14 NOTE — Progress Notes (Addendum)
 Subjective:    Patient ID: Christie [name], female    DOB: 1946/11/30, 78 y.o.   MRN: [id number]  HPI  Patient here today for follow-up Taking her blood pressure medicine Denies any nausea vomiting blurred vision Denies any unilateral numbness weakness Occasionally gets a mild headache but not severe she thinks that sinuses it is more in the frontal area She checks her blood pressure periodically at home and the readings tend to be on the high side Previously when we checked her machine the numbers were reasonable but appear to be somewhat higher than our mercury wall unit Patient does have diabetes she is trying to watch her diet she is trying to take her medicines but does not tolerate metformin well because of side effects  Results for orders placed or performed in visit on 11/20/23  Microalbumin/Creatinine Ratio, Urine   Collection Time: 11/20/23 12:00 AM  Result Value Ref Range   Creatinine, Urine 120.2 Not Estab. mg/dL   Microalbumin, Urine 78.2 Not Estab. ug/mL   Microalb/Creat Ratio 33 (H) 0 - 29 mg/g creat  Specimen status report   Collection Time: 11/20/23 12:00 AM  Result Value Ref Range   specimen status report Comment   Hemoglobin A1c   Collection Time: 02/12/24  8:05 AM  Result Value Ref Range   Hgb A1c MFr Bld 6.9 (H) 4.8 - 5.6 %   Est. average glucose Bld gHb Est-mCnc 151 mg/dL  Basic Metabolic Panel   Collection Time: 02/12/24  8:05 AM  Result Value Ref Range   Glucose 135 (H) 70 - 99 mg/dL   BUN 12 8 - 27 mg/dL   Creatinine, Ser 9.56 (L) 0.57 - 1.00 mg/dL   eGFR 94 >21 HY/QMV/7.84   BUN/Creatinine Ratio 22 12 - 28   Sodium 144 134 - 144 mmol/L   Potassium 3.7 3.5 - 5.2 mmol/L   Chloride 105 96 - 106 mmol/L   CO2 23 20 - 29 mmol/L   Calcium 9.2 8.7 - 10.3 mg/dL  Lipid Panel   Collection Time: 02/12/24  8:05 AM  Result Value Ref Range   Cholesterol, Total 149 100 - 199 mg/dL   Triglycerides 696 0 - 149 mg/dL   HDL 67 >29 mg/dL   VLDL Cholesterol Cal 22  5 - 40 mg/dL   LDL Chol Calc (NIH) 60 0 - 99 mg/dL   Chol/HDL Ratio 2.2 0.0 - 4.4 ratio  Hepatic Function Panel   Collection Time: 02/12/24  8:05 AM  Result Value Ref Range   Total Protein 6.8 6.0 - 8.5 g/dL   Albumin 4.6 3.8 - 4.8 g/dL   Bilirubin Total 0.5 0.0 - 1.2 mg/dL   Bilirubin, Direct 5.28 0.00 - 0.40 mg/dL   Alkaline Phosphatase 83 44 - 121 IU/L   AST 22 0 - 40 IU/L   ALT 25 0 - 32 IU/L   Has intermittent vertigo symptoms of the room spinning sees ENT for this  Also has intermittent spells of feeling unsteady with a slight dizzy feeling in her head  Review of Systems     Objective:   Physical Exam General-in no acute distress Eyes-no discharge Lungs-respiratory rate normal, CTA CV-no murmurs,RRR Extremities skin warm dry no edema Neuro grossly normal Behavior normal, alert  Blood pressure was checked multiple times with a wall unit-mercury cuff-large cuff-patient sitting arm supported cuff at heart level Best reading was 160/81 but when she stands up it drops to 142/62  Should be noted that with her mammogram  they did breast arterial calcification test which was positive.  Patient was told given that she is not having any serious cardiac symptoms best approach get blood pressure under control as well as cholesterol A1c she has an appointment with cardiology coming up     Assessment & Plan:  1. Essential hypertension, benign (Primary) Blood pressure systolic is elevated she takes her medicine she relates she could do a little bit better on dietary measures encourage increased healthy diet along with activity  Given her age plus also given the fact that her blood pressure drops significantly with standing and also has some intermittent unsteadiness I am hesitant regarding being too aggressive with medications She has an appointment with cardiology coming up in early March we will ask them to get their input regarding this patient's blood pressure  medicines  Previously we tried diuretics and she had significant drop in blood pressure to the point of feeling very fatigued and worn out  2. Diabetes mellitus without complication (HCC) A1c good control continue current medications.  We did discuss possibility of GLP-1 medicines.  They would help benefit her in multiple ways but the cost is a drawback  3. Hyperlipidemia associated with type 2 diabetes mellitus (HCC) LDL is below 70 if possible to get below 55 continue on current medications and diet  4. Aortic stenosis, mild I do not think that this is contributing to her lightheadedness but I am hesitant to in regards to being overaggressive regarding blood pressure we will get cardiology input please see conversation above  5. Dizziness Please see discussion above Stable currently  Follow-up as planned we will do lab work before that visit   Extensive discussion held with patient regarding possibility of utilizing Repatha, utilizing GLP-1, goals of A1c LDL and blood pressure in regards to reducing risk of heart attack and stroke

## 2024-02-15 ENCOUNTER — Other Ambulatory Visit: Payer: Self-pay

## 2024-02-15 DIAGNOSIS — E1169 Type 2 diabetes mellitus with other specified complication: Secondary | ICD-10-CM

## 2024-02-15 DIAGNOSIS — I10 Essential (primary) hypertension: Secondary | ICD-10-CM

## 2024-02-15 DIAGNOSIS — Z79899 Other long term (current) drug therapy: Secondary | ICD-10-CM

## 2024-02-15 DIAGNOSIS — E119 Type 2 diabetes mellitus without complications: Secondary | ICD-10-CM

## 2024-02-19 ENCOUNTER — Other Ambulatory Visit: Payer: Self-pay | Admitting: Family Medicine

## 2024-02-22 ENCOUNTER — Encounter: Payer: Self-pay | Admitting: Cardiovascular Disease

## 2024-02-22 ENCOUNTER — Ambulatory Visit: Payer: Medicare PPO | Attending: Cardiovascular Disease | Admitting: Cardiovascular Disease

## 2024-02-22 VITALS — BP 154/70 | HR 76 | Ht 63.5 in | Wt 171.8 lb

## 2024-02-22 DIAGNOSIS — I10 Essential (primary) hypertension: Secondary | ICD-10-CM | POA: Diagnosis not present

## 2024-02-22 DIAGNOSIS — R42 Dizziness and giddiness: Secondary | ICD-10-CM | POA: Diagnosis not present

## 2024-02-22 DIAGNOSIS — E782 Mixed hyperlipidemia: Secondary | ICD-10-CM | POA: Diagnosis not present

## 2024-02-22 DIAGNOSIS — I350 Nonrheumatic aortic (valve) stenosis: Secondary | ICD-10-CM | POA: Diagnosis not present

## 2024-02-22 MED ORDER — MECLIZINE HCL 25 MG PO TABS: 25.0000 mg | ORAL_TABLET | Freq: Three times a day (TID) | ORAL | 1 refills | Status: DC | PRN

## 2024-02-22 NOTE — Patient Instructions (Signed)
 Medication Instructions:   Take Meclizine 25 mg as needed   *If you need a refill on your cardiac medications before your next appointment, please call your pharmacy*   Lab Work: NONE   If you have labs (blood work) drawn today and your tests are completely normal, you will receive your results only by: MyChart Message (if you have MyChart) OR A paper copy in the mail If you have any lab test that is abnormal or we need to change your treatment, we will call you to review the results.   Testing/Procedures: Your physician has requested that you have an echocardiogram. Echocardiography is a painless test that uses sound waves to create images of your heart. It provides your doctor with information about the size and shape of your heart and how well your heart's chambers and valves are working. This procedure takes approximately one hour. There are no restrictions for this procedure. Please do NOT wear cologne, perfume, aftershave, or lotions (deodorant is allowed). Please arrive 15 minutes prior to your appointment time.  Please note: We ask at that you not bring children with you during ultrasound (echo/ vascular) testing. Due to room size and safety concerns, children are not allowed in the ultrasound rooms during exams. Our front office staff cannot provide observation of children in our lobby area while testing is being conducted. An adult accompanying a patient to their appointment will only be allowed in the ultrasound room at the discretion of the ultrasound technician under special circumstances. We apologize for any inconvenience.    Follow-Up: At [facility], you and your health needs are our priority.  As part of our continuing mission to provide you with exceptional heart care, we have created designated Provider Care Teams.  These Care Teams include your primary Cardiologist (physician) and Advanced Practice Providers (APPs -  Physician Assistants and Nurse  Practitioners) who all work together to provide you with the care you need, when you need it.  We recommend signing up for the patient portal called "MyChart".  Sign up information is provided on this After Visit Summary.  MyChart is used to connect with patients for Virtual Visits (Telemedicine).  Patients are able to view lab/test results, encounter notes, upcoming appointments, etc.  Non-urgent messages can be sent to your provider as well.   To learn more about what you can do with MyChart, go to [email or web address].    Your next appointment:   1 year(s)  Provider:   You may see Charlton Haws, MD or one of the following Advanced Practice Providers on your designated Care Team:   Randall An, PA-C  Jacolyn Reedy, PA-C     Other Instructions Thank you for choosing [facility]!

## 2024-02-25 ENCOUNTER — Other Ambulatory Visit: Payer: Self-pay | Admitting: Family Medicine

## 2024-03-04 ENCOUNTER — Other Ambulatory Visit: Payer: Self-pay | Admitting: Family Medicine

## 2024-03-07 ENCOUNTER — Other Ambulatory Visit: Payer: Self-pay | Admitting: Family Medicine

## 2024-03-07 DIAGNOSIS — E119 Type 2 diabetes mellitus without complications: Secondary | ICD-10-CM

## 2024-03-11 ENCOUNTER — Other Ambulatory Visit: Payer: Self-pay

## 2024-03-11 ENCOUNTER — Other Ambulatory Visit: Payer: Self-pay | Admitting: Family Medicine

## 2024-03-11 MED ORDER — VALSARTAN 160 MG PO TABS: 160.0000 mg | ORAL_TABLET | Freq: Two times a day (BID) | ORAL | 3 refills | Status: DC

## 2024-03-11 NOTE — Telephone Encounter (Signed)
 I truly do not think meclizine would be of any help.  I would recommend stopping it and taking it off the medication As for the blood pressure may well need to be on additional medication I would recommend a follow-up office visit with myself or with Toni Amend somewhere this week or next week to help address may have same-day slot  Bring blood pressure cuff with her to the visit

## 2024-03-11 NOTE — Telephone Encounter (Signed)
 Copied from CRM [id number]. Topic: Clinical - Prescription Issue >> Mar 11, 2024  1:31 PM Clayton Bibles wrote: Reason for CRM:  She is taking valsartan (DIOVAN) 160 MG tablet 2 times daily per Dr. Gerda Diss. [facility] needs a new prescription so they can refill it correctly. Robbie Lis has old prescription.  She will be out Thursday (03/14/24)

## 2024-03-11 NOTE — Telephone Encounter (Signed)
 Spoke with patient to inform per drs notes, appt scheduled.

## 2024-03-12 ENCOUNTER — Other Ambulatory Visit: Payer: Self-pay | Admitting: Family Medicine

## 2024-03-12 ENCOUNTER — Encounter: Payer: Self-pay | Admitting: Family Medicine

## 2024-03-12 ENCOUNTER — Telehealth: Payer: Self-pay | Admitting: Family Medicine

## 2024-03-12 ENCOUNTER — Ambulatory Visit: Admitting: Family Medicine

## 2024-03-12 VITALS — BP 167/75 | HR 80 | Temp 97.1°F | Ht 63.5 in | Wt 171.0 lb

## 2024-03-12 DIAGNOSIS — I10 Essential (primary) hypertension: Secondary | ICD-10-CM

## 2024-03-12 MED ORDER — HYDROCHLOROTHIAZIDE 12.5 MG PO CAPS: 12.5000 mg | ORAL_CAPSULE | Freq: Every day | ORAL | 5 refills | Status: DC

## 2024-03-12 NOTE — Telephone Encounter (Signed)
 Please set up patient for a follow-up blood pressure check with myself in approximately 3 weeks thank you Please notify patient

## 2024-03-12 NOTE — Progress Notes (Signed)
   Subjective:    Patient ID: Christie [name], female    DOB: 1946/09/03, 78 y.o.   MRN: [id number]  HPIEssential hypertension, benign   Dizzy when laying down at night  Significant HTN Finds himself a time feeling lightheaded Also having some true vertigo symptoms when she lays down and moves intermittently Denies unilateral numbness or weakness No shortness of breath or chest tightness    Review of Systems     Objective:   Physical Exam  General-in no acute distress Eyes-no discharge Lungs-respiratory rate normal, CTA CV-no murmurs,RRR Extremities skin warm dry no edema Neuro grossly normal Behavior normal, alert  Blood pressure checked several times is significantly elevated      Assessment & Plan:   H EN Patient good compliance with her other medicines Initiate HCTZ 12.5 mg daily Follow-up again within 3 to 4 weeks Check lab work in approximately 10 to 12 days Continue other medicines as is

## 2024-03-17 ENCOUNTER — Encounter: Payer: Self-pay | Admitting: Family Medicine

## 2024-03-18 ENCOUNTER — Other Ambulatory Visit: Payer: Self-pay

## 2024-03-18 DIAGNOSIS — H9313 Tinnitus, bilateral: Secondary | ICD-10-CM

## 2024-03-18 NOTE — Telephone Encounter (Signed)
 Nurses-please go ahead with physical therapy referral for vestibular retraining to the colleague that Christinamarie mentioned. Please have it set urgently Please let Lauretta know that a referral has been initiated. Then Jadwiga should be able to contact them toward the end of the week to set up the appointment.  Or perhaps a referral team will help set this up thank you-Dr. Lorin Picket

## 2024-03-22 DIAGNOSIS — I10 Essential (primary) hypertension: Secondary | ICD-10-CM | POA: Diagnosis not present

## 2024-03-23 ENCOUNTER — Encounter: Payer: Self-pay | Admitting: Family Medicine

## 2024-03-23 LAB — BASIC METABOLIC PANEL WITH GFR
BUN/Creatinine Ratio: 28 (ref 12–28)
BUN: 16 mg/dL (ref 8–27)
CO2: 26 mmol/L (ref 20–29)
Calcium: 10.3 mg/dL (ref 8.7–10.3)
Chloride: 101 mmol/L (ref 96–106)
Creatinine, Ser: 0.58 mg/dL (ref 0.57–1.00)
Glucose: 164 mg/dL — ABNORMAL HIGH (ref 70–99)
Potassium: 3.9 mmol/L (ref 3.5–5.2)
Sodium: 142 mmol/L (ref 134–144)
eGFR: 92 mL/min/{1.73_m2} (ref >59)

## 2024-03-26 NOTE — Telephone Encounter (Signed)
 Referral was sent this morning to [facility] by referral coordinator and they will be reaching out to patient to schedule an office visit.

## 2024-03-26 NOTE — Telephone Encounter (Signed)
 The pts daughter stated they called in to schedule an appointment for the pts physical therapy referral for vestibular retraining but [facility] but they haven't received the referral. The pts daughter also has more questions about the referral process and asked if I could have a nurse give hear callback   Callback # [phone] Irving Burton

## 2024-03-27 DIAGNOSIS — H9313 Tinnitus, bilateral: Secondary | ICD-10-CM | POA: Diagnosis not present

## 2024-03-27 DIAGNOSIS — M6281 Muscle weakness (generalized): Secondary | ICD-10-CM | POA: Diagnosis not present

## 2024-04-01 DIAGNOSIS — M6281 Muscle weakness (generalized): Secondary | ICD-10-CM | POA: Diagnosis not present

## 2024-04-01 DIAGNOSIS — H9313 Tinnitus, bilateral: Secondary | ICD-10-CM | POA: Diagnosis not present

## 2024-04-03 DIAGNOSIS — M6281 Muscle weakness (generalized): Secondary | ICD-10-CM | POA: Diagnosis not present

## 2024-04-03 DIAGNOSIS — H9313 Tinnitus, bilateral: Secondary | ICD-10-CM | POA: Diagnosis not present

## 2024-04-04 ENCOUNTER — Encounter: Payer: Self-pay | Admitting: Family Medicine

## 2024-04-04 ENCOUNTER — Ambulatory Visit: Admitting: Family Medicine

## 2024-04-04 VITALS — BP 134/68 | HR 80 | Temp 97.3°F | Ht 63.5 in | Wt 168.0 lb

## 2024-04-04 DIAGNOSIS — Z7984 Long term (current) use of oral hypoglycemic drugs: Secondary | ICD-10-CM

## 2024-04-04 DIAGNOSIS — E1159 Type 2 diabetes mellitus with other circulatory complications: Secondary | ICD-10-CM

## 2024-04-04 DIAGNOSIS — M816 Localized osteoporosis [Lequesne]: Secondary | ICD-10-CM | POA: Diagnosis not present

## 2024-04-04 DIAGNOSIS — I10 Essential (primary) hypertension: Secondary | ICD-10-CM

## 2024-04-04 DIAGNOSIS — E119 Type 2 diabetes mellitus without complications: Secondary | ICD-10-CM

## 2024-04-04 MED ORDER — ALENDRONATE SODIUM 70 MG PO TABS: 70.0000 mg | ORAL_TABLET | ORAL | 3 refills | Status: AC

## 2024-04-04 NOTE — Progress Notes (Signed)
   Subjective:    Patient ID: Christie [name], female    DOB: [birth year], 78 y.o.   MRN: [id number]  HPIEssential hypertension, benign  Taking her medicine Watching her diet Trying to stay active Denies any headaches Her dizziness is doing better Been going through physical therapy    Review of Systems     Objective:   Physical Exam General-in no acute distress Eyes-no discharge Lungs-respiratory rate normal, CTA CV-no murmurs,RRR Extremities skin warm dry no edema Neuro grossly normal Behavior normal, alert        Assessment & Plan:  Blood pressure is under much better control continue current measures Healthy diet regular activity  1. Essential hypertension, benign (Primary) Very good numbers Continue current measures Lab work before next visit Follow-up in July  2. Diabetes mellitus without complication (HCC) Continue healthy diet We did discuss how to recognize low sugars Glipizide is being utilized for this patient to help keep diabetes under control along with Rybelsus Other options-patient does not tolerate metformin also do not feel patient needs to be on insulin.  3. Localized osteoporosis without current pathological fracture Continue Fosamax.  Bone density to be done in August 2025  We did discuss glipizide and safety Patient will do her lab work before her follow-up visit

## 2024-04-08 DIAGNOSIS — M6281 Muscle weakness (generalized): Secondary | ICD-10-CM | POA: Diagnosis not present

## 2024-04-08 DIAGNOSIS — H9313 Tinnitus, bilateral: Secondary | ICD-10-CM | POA: Diagnosis not present

## 2024-04-10 DIAGNOSIS — M6281 Muscle weakness (generalized): Secondary | ICD-10-CM | POA: Diagnosis not present

## 2024-04-10 DIAGNOSIS — H9313 Tinnitus, bilateral: Secondary | ICD-10-CM | POA: Diagnosis not present

## 2024-04-15 DIAGNOSIS — H9313 Tinnitus, bilateral: Secondary | ICD-10-CM | POA: Diagnosis not present

## 2024-04-15 DIAGNOSIS — M6281 Muscle weakness (generalized): Secondary | ICD-10-CM | POA: Diagnosis not present

## 2024-04-16 ENCOUNTER — Encounter: Payer: Self-pay | Admitting: *Deleted

## 2024-04-17 DIAGNOSIS — M6281 Muscle weakness (generalized): Secondary | ICD-10-CM | POA: Diagnosis not present

## 2024-04-17 DIAGNOSIS — H9313 Tinnitus, bilateral: Secondary | ICD-10-CM | POA: Diagnosis not present

## 2024-04-19 ENCOUNTER — Ambulatory Visit: Payer: Medicare PPO | Admitting: Family Medicine

## 2024-05-09 ENCOUNTER — Other Ambulatory Visit: Payer: Self-pay | Admitting: Family Medicine

## 2024-05-21 DIAGNOSIS — H35031 Hypertensive retinopathy, right eye: Secondary | ICD-10-CM | POA: Diagnosis not present

## 2024-05-26 ENCOUNTER — Other Ambulatory Visit: Payer: Self-pay | Admitting: Family Medicine

## 2024-05-29 ENCOUNTER — Ambulatory Visit: Payer: Self-pay

## 2024-05-29 NOTE — Telephone Encounter (Signed)
 FYI Only or Action Required?: Action required by provider  Patient was last seen in primary care on 04/04/2024 by Bennet Brasil, MD. Called Nurse Triage reporting Advice Only. Symptoms began today. Interventions attempted: Nothing. Symptoms are: stable.  Triage Disposition: Home Care  Patient/caregiver understands and will follow disposition?: Yes  Would like to know if DM medication should be adjusted; states Microzide  is elevating her bgl.  Wants a call back from PCP.    Copied from CRM [id number]. Topic: Clinical - Medication Question >> May 29, 2024  3:01 PM Ary Bitter R wrote: Reason for CRM: Pt started the medication, hydrochlorothiazide  (MICROZIDE ) 12.5 MG capsule in April. Her blood sugar then started going up the 140s last month and wondered if this medication is causing that to happen. Please contact pt at [phone]. Reason for Disposition  Nursing judgment  Protocols used: No Guideline or Reference Available-[facility]

## 2024-05-30 ENCOUNTER — Encounter: Payer: Self-pay | Admitting: *Deleted

## 2024-05-30 NOTE — Telephone Encounter (Signed)
 Nurses-let Christie [name] know that this can happen in some cases with HCTZ.  Unfortunately diuretics can help blood pressure but can also cause sugar to go up.  If she would like to we can try a different blood pressure medicine or we may have to adjust her glipizide   Please confirm her current dose of glipizide   Please have patient let us  know what she would prefer for us  to do in that regards thank you

## 2024-05-30 NOTE — Telephone Encounter (Signed)
 Provider recommendation sent to patient via my chart

## 2024-06-01 ENCOUNTER — Other Ambulatory Visit: Payer: Self-pay | Admitting: Family Medicine

## 2024-06-01 DIAGNOSIS — E119 Type 2 diabetes mellitus without complications: Secondary | ICD-10-CM

## 2024-06-01 MED ORDER — GLIPIZIDE 5 MG PO TABS: ORAL_TABLET | ORAL | 1 refills | Status: DC

## 2024-06-13 ENCOUNTER — Ambulatory Visit: Admitting: Family Medicine

## 2024-06-13 ENCOUNTER — Encounter: Payer: Self-pay | Admitting: Family Medicine

## 2024-06-13 VITALS — BP 137/77 | HR 74 | Temp 98.7°F | Ht 63.5 in | Wt 169.0 lb

## 2024-06-13 DIAGNOSIS — E1169 Type 2 diabetes mellitus with other specified complication: Secondary | ICD-10-CM

## 2024-06-13 DIAGNOSIS — E119 Type 2 diabetes mellitus without complications: Secondary | ICD-10-CM

## 2024-06-13 DIAGNOSIS — E785 Hyperlipidemia, unspecified: Secondary | ICD-10-CM

## 2024-06-13 DIAGNOSIS — Z7984 Long term (current) use of oral hypoglycemic drugs: Secondary | ICD-10-CM

## 2024-06-13 DIAGNOSIS — R5383 Other fatigue: Secondary | ICD-10-CM | POA: Diagnosis not present

## 2024-06-13 DIAGNOSIS — R140 Abdominal distension (gaseous): Secondary | ICD-10-CM

## 2024-06-13 DIAGNOSIS — R109 Unspecified abdominal pain: Secondary | ICD-10-CM

## 2024-06-13 NOTE — Progress Notes (Signed)
 Subjective:    Patient ID: Christie [name], female    DOB: March 06, 1946, 78 y.o.   MRN: [id number]  HPI Abdominal discomfort , fatigue and very low energy 2 weeks, nausea no vomiting, diarrhea or constipation , appetite ok Patient has had 2 weeks of a general abdominal discomfort and mild pain this is present both day and night.  Does not get worse with eating does not get worse with movement she has had some nausea with this but no vomiting she denies any diarrhea denies any blood in her stool denies any fever or chills she relates a lot of fatigue tiredness and low energy She has a history of gynecologic tumor previously treated greater than 20 years ago Discussed the use of AI scribe software for clinical note transcription with the patient, who gave verbal consent to proceed.  History of Present Illness   Christie [name] is an 78 year old female who presents with persistent abdominal discomfort and low energy.  Abdominal discomfort - Persistent abdominal discomfort for the past two weeks - Described as a general ache, not sharp or shooting - Present daily, sometimes improves during the day - Does not wake her at night, but present when waking to urinate - Movement may slightly alleviate discomfort - Eating exacerbates symptoms - Dicyclomine  provides some relief, but not taken three times daily as prescribed  Fatigue and low energy - Low energy began slightly before onset of abdominal discomfort - Requires frequent sitting due to fatigue - Duration similar to abdominal symptoms (approximately two weeks)  Gastrointestinal symptoms - Nausea, particularly in the mornings when attempting to eat breakfast - No vomiting - Appetite somewhat reduced, but continues to eat - Bowel movements generally brown, sometimes dark - No visible blood in stool - No severe constipation  Glycemic control - Manages blood sugar with glipizide  (two tablets in the morning, one and a half at night) - Blood sugar  fluctuating, with morning readings of 128 and 147 - Does not routinely check blood sugar later in the day       Review of Systems     Objective:   Physical Exam  General-in no acute distress Eyes-no discharge Lungs-respiratory rate normal, CTA CV-no murmurs,RRR Extremities skin warm dry no edema Neuro grossly normal Behavior normal, alert Abdomen is soft but there is subjective discomfort throughout the whole abdomen and she also relates a feeling of being bloated      Assessment & Plan:  1. Abdominal discomfort (Primary) Given her discomfort as well as persistence of this over the past couple weeks and the remote history of cancer need to look into this further there is also possibility of diverticulitis although this is not as high I would recommend lab work we will also proceed forward with getting a CT scan of the abdomen pelvis with contrast - CBC - Lipase  2. Diabetes mellitus without complication (HCC) She will monitor her sugars periodically over the course of the next week send us  the readings may need up the dose of glipizide  - Basic metabolic panel with GFR  3. Other fatigue Check lab work await results - CBC  4. Hyperlipidemia associated with type 2 diabetes mellitus (HCC) Healthy diet lab work ordered await results - Lipid panel  5. Abdominal bloating Labs ordered awaiting results Will need CT scan Will pursue CT scan more than likely for either Friday afternoon or Monday or Tuesday  Assessment and Plan    Abdominal Discomfort Persistent abdominal ache with  occasional nausea, not severe. Concern for malignancy due to personal and family history. Initial relief with dicyclomine , but symptoms persist. Further evaluation with imaging and labs planned. - Order blood work. - Order CT scan of the abdomen. - Consider gastroenterology referral if results are inconclusive. - Prescribe updated dicyclomine .  Fatigue Fatigue coinciding with abdominal  discomfort, not debilitating. To be evaluated with upcoming tests. - Evaluate fatigue with blood work and CT scan results.  Diabetes Mellitus Fluctuating glucose levels on glipizide . Monitoring plan discussed to assess control and avoid hypoglycemia during fasting blood work. - Monitor blood glucose twice in the morning and twice two hours after supper for one week. - Advise holding glipizide  on the morning of blood work until after eating. - Evaluate glucose readings for potential medication adjustment.

## 2024-06-14 ENCOUNTER — Telehealth: Payer: Self-pay | Admitting: Family Medicine

## 2024-06-14 ENCOUNTER — Other Ambulatory Visit: Payer: Self-pay | Admitting: Family Medicine

## 2024-06-14 LAB — BASIC METABOLIC PANEL WITH GFR
BUN/Creatinine Ratio: 23 (ref 12–28)
BUN: 14 mg/dL (ref 8–27)
CO2: 25 mmol/L (ref 20–29)
Calcium: 10.2 mg/dL (ref 8.7–10.3)
Chloride: 99 mmol/L (ref 96–106)
Creatinine, Ser: 0.61 mg/dL (ref 0.57–1.00)
Glucose: 189 mg/dL — ABNORMAL HIGH (ref 70–99)
Potassium: 3.9 mmol/L (ref 3.5–5.2)
Sodium: 139 mmol/L (ref 134–144)
eGFR: 90 mL/min/{1.73_m2} (ref >59)

## 2024-06-14 LAB — CBC
Hematocrit: 44.6 % (ref 34.0–46.6)
Hemoglobin: 14.4 g/dL (ref 11.1–15.9)
MCH: 31.1 pg (ref 26.6–33.0)
MCHC: 32.3 g/dL (ref 31.5–35.7)
MCV: 96 fL (ref 79–97)
Platelets: 171 10*3/uL (ref 150–450)
RBC: 4.63 x10E6/uL (ref 3.77–5.28)
RDW: 12.4 % (ref 11.7–15.4)
WBC: 9.3 10*3/uL (ref 3.4–10.8)

## 2024-06-14 LAB — LIPASE: Lipase: 58 U/L (ref 14–85)

## 2024-06-14 LAB — LIPID PANEL
Chol/HDL Ratio: 2.3 ratio (ref 0.0–4.4)
Cholesterol, Total: 143 mg/dL (ref 100–199)
HDL: 63 mg/dL (ref >39)
LDL Chol Calc (NIH): 53 mg/dL (ref 0–99)
Triglycerides: 161 mg/dL — ABNORMAL HIGH (ref 0–149)
VLDL Cholesterol Cal: 27 mg/dL (ref 5–40)

## 2024-06-14 MED ORDER — DICYCLOMINE HCL 10 MG PO CAPS: ORAL_CAPSULE | ORAL | 2 refills | Status: DC

## 2024-06-14 NOTE — Telephone Encounter (Signed)
 Prescription sent to [facility] pharmacy as requested

## 2024-06-14 NOTE — Telephone Encounter (Signed)
 Copied from CRM [id number]. Topic: Clinical - Prescription Issue >> Jun 14, 2024  2:39 PM Avram MATSU wrote: Reason for CRM: patient is waiting for the provider to send her RX dicyclomine  (BENTYL ) 10 MG capsule [[id number]] she had her appt yesterday with him.

## 2024-06-15 ENCOUNTER — Telehealth: Payer: Self-pay | Admitting: Family Medicine

## 2024-06-15 ENCOUNTER — Ambulatory Visit: Payer: Self-pay | Admitting: Family Medicine

## 2024-06-15 NOTE — Telephone Encounter (Signed)
 Nurses Please see result note Needs urgent CT scan abdomen pelvis this week due to abdominal pain and discomfort with history of vulvar cancer

## 2024-06-17 ENCOUNTER — Other Ambulatory Visit: Payer: Self-pay

## 2024-06-17 ENCOUNTER — Other Ambulatory Visit: Payer: Self-pay | Admitting: Family Medicine

## 2024-06-17 DIAGNOSIS — R109 Unspecified abdominal pain: Secondary | ICD-10-CM

## 2024-06-17 DIAGNOSIS — C519 Malignant neoplasm of vulva, unspecified: Secondary | ICD-10-CM

## 2024-06-17 NOTE — Telephone Encounter (Signed)
 Ct  ordered, Message sent to C. Verdon for scheduling

## 2024-06-25 NOTE — Telephone Encounter (Signed)
Message sent to referral coordinator

## 2024-06-25 NOTE — Telephone Encounter (Signed)
 Nurses Her CAT scan was ordered back on 30 June she is still not heard Please have referral team work on this I would like to have this CAT scan completed this week or next week please may be read stat due to abdominal pain over the past month  She may increase her glipizide  to 2 tablets in the evening time continue everything else as is and send us  an update on the glucose readings within the next 2 weeks

## 2024-06-25 NOTE — Telephone Encounter (Signed)
 Apparently related to the referral team they stated that the insurance company is still reviewing whether or not they will approve that CAT scan-so we are currently awaiting their decision thank you

## 2024-07-10 ENCOUNTER — Ambulatory Visit (HOSPITAL_COMMUNITY)
Admission: RE | Admit: 2024-07-10 | Discharge: 2024-07-10 | Disposition: A | Discharge status: Home / Self Care | Source: Ambulatory Visit | Attending: Family Medicine | Admitting: Family Medicine

## 2024-07-10 DIAGNOSIS — R109 Unspecified abdominal pain: Secondary | ICD-10-CM | POA: Diagnosis not present

## 2024-07-10 DIAGNOSIS — N281 Cyst of kidney, acquired: Secondary | ICD-10-CM | POA: Diagnosis not present

## 2024-07-10 DIAGNOSIS — C519 Malignant neoplasm of vulva, unspecified: Secondary | ICD-10-CM | POA: Insufficient documentation

## 2024-07-10 DIAGNOSIS — Z8544 Personal history of malignant neoplasm of other female genital organs: Secondary | ICD-10-CM | POA: Diagnosis not present

## 2024-07-10 DIAGNOSIS — N200 Calculus of kidney: Secondary | ICD-10-CM | POA: Diagnosis not present

## 2024-07-13 ENCOUNTER — Ambulatory Visit: Payer: Self-pay | Admitting: Family Medicine

## 2024-07-15 DIAGNOSIS — Z79899 Other long term (current) drug therapy: Secondary | ICD-10-CM | POA: Diagnosis not present

## 2024-07-15 DIAGNOSIS — E1169 Type 2 diabetes mellitus with other specified complication: Secondary | ICD-10-CM | POA: Diagnosis not present

## 2024-07-15 DIAGNOSIS — I10 Essential (primary) hypertension: Secondary | ICD-10-CM | POA: Diagnosis not present

## 2024-07-15 DIAGNOSIS — E785 Hyperlipidemia, unspecified: Secondary | ICD-10-CM | POA: Diagnosis not present

## 2024-07-15 DIAGNOSIS — E119 Type 2 diabetes mellitus without complications: Secondary | ICD-10-CM | POA: Diagnosis not present

## 2024-07-17 ENCOUNTER — Ambulatory Visit: Payer: Self-pay | Admitting: Family Medicine

## 2024-07-17 ENCOUNTER — Encounter: Payer: Self-pay | Admitting: Family Medicine

## 2024-07-17 ENCOUNTER — Ambulatory Visit: Payer: Medicare PPO | Admitting: Family Medicine

## 2024-07-17 VITALS — BP 132/68 | HR 79 | Temp 98.2°F | Ht 63.5 in | Wt 168.0 lb

## 2024-07-17 DIAGNOSIS — E119 Type 2 diabetes mellitus without complications: Secondary | ICD-10-CM | POA: Diagnosis not present

## 2024-07-17 DIAGNOSIS — I10 Essential (primary) hypertension: Secondary | ICD-10-CM | POA: Diagnosis not present

## 2024-07-17 DIAGNOSIS — M816 Localized osteoporosis [Lequesne]: Secondary | ICD-10-CM | POA: Diagnosis not present

## 2024-07-17 DIAGNOSIS — R5383 Other fatigue: Secondary | ICD-10-CM | POA: Diagnosis not present

## 2024-07-17 DIAGNOSIS — E538 Deficiency of other specified B group vitamins: Secondary | ICD-10-CM | POA: Diagnosis not present

## 2024-07-17 DIAGNOSIS — R109 Unspecified abdominal pain: Secondary | ICD-10-CM

## 2024-07-17 DIAGNOSIS — Z13820 Encounter for screening for osteoporosis: Secondary | ICD-10-CM

## 2024-07-17 LAB — HEPATIC FUNCTION PANEL
ALT: 36 IU/L — ABNORMAL HIGH (ref 0–32)
AST: 28 IU/L (ref 0–40)
Albumin: 4.7 g/dL (ref 3.8–4.8)
Alkaline Phosphatase: 96 IU/L (ref 44–121)
Bilirubin Total: 0.5 mg/dL (ref 0.0–1.2)
Bilirubin, Direct: 0.18 mg/dL (ref 0.00–0.40)
Total Protein: 6.6 g/dL (ref 6.0–8.5)

## 2024-07-17 LAB — BASIC METABOLIC PANEL WITH GFR
BUN/Creatinine Ratio: 18 (ref 12–28)
BUN: 11 mg/dL (ref 8–27)
CO2: 22 mmol/L (ref 20–29)
Calcium: 9.7 mg/dL (ref 8.7–10.3)
Chloride: 102 mmol/L (ref 96–106)
Creatinine, Ser: 0.61 mg/dL (ref 0.57–1.00)
Glucose: 154 mg/dL — ABNORMAL HIGH (ref 70–99)
Potassium: 4.2 mmol/L (ref 3.5–5.2)
Sodium: 141 mmol/L (ref 134–144)
eGFR: 90 mL/min/1.73 (ref >59)

## 2024-07-17 LAB — HEMOGLOBIN A1C
Est. average glucose Bld gHb Est-mCnc: 154 mg/dL
Hgb A1c MFr Bld: 7 % — ABNORMAL HIGH (ref 4.8–5.6)

## 2024-07-17 LAB — LIPID PANEL
Chol/HDL Ratio: 2.5 ratio (ref 0.0–4.4)
Cholesterol, Total: 147 mg/dL (ref 100–199)
HDL: 58 mg/dL (ref >39)
LDL Chol Calc (NIH): 63 mg/dL (ref 0–99)
Triglycerides: 153 mg/dL — ABNORMAL HIGH (ref 0–149)
VLDL Cholesterol Cal: 26 mg/dL (ref 5–40)

## 2024-07-17 LAB — MICROALBUMIN / CREATININE URINE RATIO
Creatinine, Urine: 129.1 mg/dL
Microalb/Creat Ratio: 54 mg/g{creat} — AB (ref 0–29)
Microalbumin, Urine: 69.6 ug/mL

## 2024-07-17 NOTE — Progress Notes (Signed)
   Subjective:    Patient ID: Christie [name], female    DOB: 04/20/1946, 78 y.o.   MRN: [id number]  HPI Patient here today Having ongoing abdominal discomfort Multiple days a week This been going on for several months CAT scan was nonrevealing Lab work nonrevealing Not losing any weight She states diabetes doing okay States diet is reasonable Tries to stay active Relates a lot of fatigue and tiredness Denies rectal bleeding Denies chest tightness pressure pain shortness of breath  Diabetes under decent control Taking Fosamax  weekly due for bone density Uses Xanax  at nighttime to help her sleep does not abuse it Is on her cholesterol medicine on a regular basis Takes her diabetes medicine no low sugar spells Takes her blood pressure medicine with good readings   Review of Systems     Objective:   Physical Exam General-in no acute distress Eyes-no discharge Lungs-respiratory rate normal, CTA CV-no murmurs,RRR Extremities skin warm dry no edema Neuro grossly normal Behavior normal, alert        Assessment & Plan:  1. Other fatigue (Primary) Check thyroid function.  This could be contributing to her fatigue It is also possible could be deconditioning Sleep apnea less likely but possible but patient denies daytime sleepiness - TSH + free T4  2. B12 deficiency Check B12 levels been on metformin plus MCV slightly elevated - Vitamin B12  3. Abdominal discomfort Connect with her gastroenterologist Dr. Eartha.  Patient would benefit from being seen and evaluated as possible they may recommend additional testing - Ambulatory referral to Gastroenterology  4. Diabetes mellitus without complication (HCC) Reasonable A1c continue current measures healthy diet  5. Localized osteoporosis without current pathological fracture Check bone density await the results of this.  6. Essential hypertension, benign Blood pressure good control  7. Osteoporosis screening Check  bone density - DG Bone Density  Wellness exam December or January

## 2024-07-18 ENCOUNTER — Ambulatory Visit: Payer: Self-pay | Admitting: Family Medicine

## 2024-07-18 LAB — TSH+FREE T4
Free T4: 1.19 ng/dL (ref 0.82–1.77)
TSH: 1.32 u[IU]/mL (ref 0.450–4.500)

## 2024-07-18 LAB — VITAMIN B12: Vitamin B-12: 732 pg/mL (ref 232–1245)

## 2024-07-18 NOTE — Addendum Note (Signed)
 Addended by: CATHRYNE STEVA PARAS on: 07/18/2024 10:35 AM   Modules accepted: Orders

## 2024-07-29 ENCOUNTER — Encounter (INDEPENDENT_AMBULATORY_CARE_PROVIDER_SITE_OTHER): Payer: Self-pay | Admitting: Otolaryngology

## 2024-07-29 ENCOUNTER — Ambulatory Visit (INDEPENDENT_AMBULATORY_CARE_PROVIDER_SITE_OTHER): Payer: Medicare PPO | Admitting: Otolaryngology

## 2024-07-29 VITALS — BP 140/82 | HR 74

## 2024-07-29 DIAGNOSIS — H903 Sensorineural hearing loss, bilateral: Secondary | ICD-10-CM

## 2024-07-29 DIAGNOSIS — H6983 Other specified disorders of Eustachian tube, bilateral: Secondary | ICD-10-CM

## 2024-07-29 DIAGNOSIS — R42 Dizziness and giddiness: Secondary | ICD-10-CM

## 2024-07-29 DIAGNOSIS — H9313 Tinnitus, bilateral: Secondary | ICD-10-CM

## 2024-07-29 DIAGNOSIS — H6980 Other specified disorders of Eustachian tube, unspecified ear: Secondary | ICD-10-CM

## 2024-07-31 NOTE — Progress Notes (Signed)
 Patient ID: Christie [name], female   DOB: December 08, 1946, 78 y.o.   MRN: [id number]  Follow-up: Recurrent dizziness, hearing loss, eustachian tube dysfunction  HPI: The patient is an 78 year old female who returns today for her follow-up evaluation.  The patient has a history of recurrent dizziness, bilateral sensorineural hearing loss, tinnitus, and eustachian tube dysfunction. She has a history of bilateral otosclerosis. She underwent bilateral stapedectomy surgery by Dr. Thaddeus. Her postsurgical audiogram showed bilateral high-frequency sensorineural hearing loss. No significant conductive hearing loss was noted. She also has a history of bilateral eustachian tube dysfunction with clogging sensation in her ears. She was treated with Flonase  nasal spray and Valsalva exercise.  Her dizziness was noted to be a result of benign paroxysmal positional vertigo.  She was treated with the Epley maneuver.  The patient returns today reporting improvement in her dizziness.  She was recently treated by a physical therapist.  She is performing vestibular exercises at home.  Currently she denies any otalgia, otorrhea, vertigo, or change in her hearing.  Exam: General: Communicates without difficulty, well nourished, no acute distress. Head: Normocephalic, no evidence injury, no tenderness, facial buttresses intact without stepoff. Face/sinus: No tenderness to palpation and percussion. Facial movement is normal and symmetric. Eyes: PERRL, EOMI. No scleral icterus, conjunctivae clear. Neuro: CN II exam reveals vision grossly intact.  No nystagmus at any point of gaze. Ears: Auricles well formed without lesions.  Ear canals are intact without mass or lesion.  No erythema or edema is appreciated.  The TMs are intact without fluid. Nose: External evaluation reveals normal support and skin without lesions.  Dorsum is intact.  Anterior rhinoscopy reveals congested mucosa over anterior aspect of inferior turbinates and intact septum.  No  purulence noted. Oral:  Oral cavity and oropharynx are intact, symmetric, without erythema or edema.  Mucosa is moist without lesions. Neck: Full range of motion without pain.  There is no significant lymphadenopathy.  No masses palpable.  Thyroid bed within normal limits to palpation.  Parotid glands and submandibular glands equal bilaterally without mass.  Trachea is midline. Neuro:  CN 2-12 grossly intact. Vestibular: No nystagmus at any point of gaze.  Vestibular: There is no nystagmus with pneumatic pressure on either tympanic membrane or Valsalva. The cerebellar examination is unremarkable.     Assessment: 1.  Subjectively stable bilateral high-frequency sensorineural hearing loss. 2.  Recurrent dizziness, secondary to transient benign paroxysmal positional vertigo. 3.  Clinically improved eustachian tube dysfunction. 4.  The patient's ear canals, tympanic membranes, and middle ear spaces are normal.  Plan: 1.  The physical exam findings are reviewed with the patient. 2.  Continue with Flonase  nasal spray and Valsalva exercise as needed. 3.  The pathophysiology of dizziness and BPPV are discussed. 4.  Continue with vestibular exercises at home. 5.  The patient will return for reevaluation in 1 year, sooner if needed.

## 2024-08-04 ENCOUNTER — Other Ambulatory Visit: Payer: Self-pay | Admitting: Family Medicine

## 2024-08-05 ENCOUNTER — Other Ambulatory Visit: Payer: Self-pay

## 2024-08-05 MED ORDER — ROSUVASTATIN CALCIUM 40 MG PO TABS
40.0000 mg | ORAL_TABLET | Freq: Every day | ORAL | 1 refills | Status: AC
Start: 2024-08-05 — End: ?

## 2024-08-05 MED ORDER — AMLODIPINE BESYLATE 10 MG PO TABS: 10.0000 mg | ORAL_TABLET | Freq: Every day | ORAL | 1 refills | Status: DC

## 2024-08-08 ENCOUNTER — Ambulatory Visit (INDEPENDENT_AMBULATORY_CARE_PROVIDER_SITE_OTHER): Admitting: Gastroenterology

## 2024-08-08 ENCOUNTER — Encounter (INDEPENDENT_AMBULATORY_CARE_PROVIDER_SITE_OTHER): Payer: Self-pay | Admitting: Gastroenterology

## 2024-08-08 VITALS — BP 133/74 | HR 93 | Temp 98.2°F | Ht 63.5 in | Wt 166.7 lb

## 2024-08-08 DIAGNOSIS — R109 Unspecified abdominal pain: Secondary | ICD-10-CM | POA: Diagnosis not present

## 2024-08-08 MED ORDER — HYOSCYAMINE SULFATE 0.125 MG SL SUBL: 0.1250 mg | SUBLINGUAL_TABLET | Freq: Four times a day (QID) | SUBLINGUAL | 1 refills | Status: DC | PRN

## 2024-08-08 NOTE — Progress Notes (Signed)
 Toribio Fortune, M.D. Gastroenterology & Hepatology [facility] Gastroenterology [address] [place], [place] [address] Primary Care Physician: Alphonsa [name], MD [address] [place] [address]  Referring MD: PCP  Chief Complaint:  abdominal pain  History of Present Illness: Christie [name] is a 78 y.o. female PMH vulvar cancer, depression, diabetes, HTN, HLD and IBS-D who presents for follow-up of abdominal discomfort .  The patient was last seen on 04/20/2021. At that time, the patient was continued on peppermint as needed for bloating.  Patient reports that for the last couple of months she has presented recurrent episodes of abdominal discomfort after having a meal intake. She reports having the discomfort in her mid abdominal area. Discomfort is present almost every day, but not all the time. She states that she does not have vomiting but sometimes feels nauseated when the discomfort is present. She used to take IBGArd in the past, which helped for abdominal pain in the past but sometimes it does not help completely. Bentyl  was not helping so she stopped this medication.  She has not identified a clear cut food trigger for her symptoms.  She has a BM every day. The patient denies having any nausea, vomiting, fever, chills, hematochezia, melena, hematemesis, abdominal distention, abdominal pain, diarrhea, jaundice, pruritus. Has lost a few lb as she is feeling less appetite than usual.  Further evaluation of her CT abdomen and pelvis without IV contrast performed on 07/10/2024 was completely normal. Labs on 07/15/2024 showed a normal CMP with very mildly increased ALT of 36, lipid panel with mildly increased to glycerides 153, globin A1c 7.0, TSH 1.33.  Last EGD: never Last Colonoscopy:02/10/2021  1 small polyp in the ascending colon which was a tubular adenoma. Random colonic biopsies were negative for microscopic colitis.   FHx: neg for any  gastrointestinal/liver disease, mother had pancreatic cacner, sister had kidney cancer, brother possibly had pancreatic cancer, nephew lung cancer. Social: neg smoking, alcohol or illicit drug use Surgical: cholecystectomy  Past Medical History: Past Medical History:  Diagnosis Date   Cancer (HCC) 1999   external vulva lesion   Depression    DM (diabetes mellitus) (HCC) 2012   oral medications   Elevated liver enzymes    Hearing deficit    History of kidney stones    Hyperlipidemia    Hyperlipidemia associated with type 2 diabetes mellitus (HCC) 02/25/2020   Hypertension 2000   Spastic colon     Past Surgical History: Past Surgical History:  Procedure Laterality Date   ANTERIOR LAT LUMBAR FUSION Right 07/04/2017   Procedure: Right LUMBAR TWO-THREE LUMBAR THREE-FOUR LUMBAR FOUR-FIVE Anterior lateral lumbar interbody fusion with percutaneous pedicle screws;  Surgeon: Unice Pac, MD;  Location: [facility] OR;  Service: Neurosurgery;  Laterality: Right;  Right lateral approach    ANTERIOR LATERAL LUMBAR FUSION WITH PERCUTANEOUS SCREW 3 LEVEL N/A 07/04/2017   Procedure: PERCUTANEOUS SCREW THREE LEVEL LUMBAR TWO-THREE LUMBAR THREE-FOUR LUMBAR FOUR-FIVE;  Surgeon: Unice Pac, MD;  Location: [facility] OR;  Service: Neurosurgery;  Laterality: N/A;  Prone   BACK SURGERY  2004 and 2017   BIOPSY  02/23/2021   Procedure: BIOPSY;  Surgeon: Fortune Angelia Toribio, MD;  [facility]: [facility];  Service: Gastroenterology;;   CHOLECYSTECTOMY     COLONOSCOPY  feb 2005   COLONOSCOPY N/A 02/13/2014   Procedure: COLONOSCOPY;  Surgeon: Claudis RAYMOND Rivet, MD;  Location: [facility];  Service: Endoscopy;  Laterality: N/A;  930-moved to [address]  notified pt   COLONOSCOPY WITH PROPOFOL  N/A 02/23/2021   Procedure: COLONOSCOPY WITH PROPOFOL ;  Surgeon: Eartha Angelia Sieving, MD;  [facility]: [facility];  Service: Gastroenterology;  Laterality: N/A;  AM   ESOPHAGOGASTRODUODENOSCOPY  09/2005   EYE SURGERY Right     catatract removed   EYE SURGERY Right    macular   surgery    KIDNEY STONE SURGERY     for a staghorn kidney stone   POLYPECTOMY  02/23/2021   Procedure: POLYPECTOMY;  Surgeon: Eartha Angelia Sieving, MD;  [facility]: [facility];  Service: Gastroenterology;;   STAPEDECTOMY Bilateral    Right 1991 and Left 1994   TOTAL ABDOMINAL HYSTERECTOMY  1982   vulva cancer surgery  1999    Family History: Family History  Problem Relation Age of Onset   Diabetes Father    Hypertension Father    Dementia Father    Pancreatic cancer Mother     Social History: Social History   Tobacco Use  Smoking Status Never  Smokeless Tobacco Never   Social History   Substance and Sexual Activity  Alcohol Use No   Alcohol/week: 0.0 standard drinks of alcohol   Social History   Substance and Sexual Activity  Drug Use No    Allergies: Allergies  Allergen Reactions   Atorvastatin Other (See Comments)    ELEVATED LFT's LIVER ENZYMES GO UP   Metformin And Related Other (See Comments)    REACTION IS SIDE EFFECT ,  UNSPECIFIED REACTION  DOESN'T KNOW WHY THIS IS ON THE LIST   Oxycodone-Acetaminophen  Other (See Comments)   Ace Inhibitors     UNSPECIFIED REACTION     Medications: Current Outpatient Medications  Medication Sig Dispense Refill   acetaminophen  (TYLENOL ) 500 MG tablet Take 1,000 mg by mouth every 6 (six) hours as needed (pain).     alendronate  (FOSAMAX ) 70 MG tablet Take 1 tablet (70 mg total) by mouth once a week. Take with a full glass of water  on an empty stomach. 12 tablet 3   ALPRAZolam  (XANAX ) 0.5 MG tablet TAKE 1 TABLET BY MOUTH AT BEDTIME. 30 tablet 5   amLODipine  (NORVASC ) 10 MG tablet Take 1 tablet (10 mg total) by mouth daily. 90 tablet 1   Ascorbic Acid (VITAMIN C) 1000 MG tablet Take 1,000 mg by mouth daily.     cholecalciferol (VITAMIN D ) 1000 UNITS tablet Take 1,000 Units by mouth daily.     docusate sodium  (COLACE) 100 MG capsule Take 300 mg by mouth at bedtime.      Elderberry-Vitamin C-Zinc (ELDERBERRY EXTRACT PO) Take by mouth. One tablespoon per day.     fluticasone  (FLONASE ) 50 MCG/ACT nasal spray Place 2 sprays into both nostrils daily. (Patient taking differently: Place 2 sprays into both nostrils as needed.) 1 g 0   glipiZIDE  (GLUCOTROL ) 5 MG tablet 2 tablets in the morning and 1 and 1/2 in the evening as directed to a maximum of 2 tablets in the morning and 2 tablets in the evening as directed (Patient taking differently: 2 tablets BID) 360 tablet 1   glucose blood (ACCU-CHEK GUIDE TEST) test strip Use to check blood sugar once daily as directed. 50 strip 2   hydrochlorothiazide  (MICROZIDE ) 12.5 MG capsule Take 1 capsule (12.5 mg total) by mouth daily. 30 capsule 5   Multiple Vitamins-Minerals (MACULAR VITAMIN BENEFIT PO) Take 1 tablet by mouth in the morning and at bedtime.     OVER THE COUNTER MEDICATION IB Guard BID (Patient taking differently:  daily at 6 (six) AM. IB Guard)     rosuvastatin  (CRESTOR ) 40 MG tablet Take 1 tablet (40 mg total) by mouth daily. 90 tablet 1   RYBELSUS  14 MG TABS Take 1 tablet (14 mg total) by mouth daily. 30 tablet 6   sertraline  (ZOLOFT ) 50 MG tablet TAKE (1) TABLET BY MOUTH ONCE DAILY. 90 tablet 1   valsartan  (DIOVAN ) 160 MG tablet Take 1 tablet (160 mg total) by mouth 2 (two) times daily. 90 tablet 3   zinc gluconate 50 MG tablet Take 50 mg by mouth daily.     dicyclomine  (BENTYL ) 10 MG capsule 1 taken 3 times daily as needed for discomfort (Patient not taking: Reported on 08/08/2024) 30 capsule 2   No current facility-administered medications for this visit.    Review of Systems: GENERAL: negative for malaise, night sweats HEENT: No changes in hearing or vision, no nose bleeds or other nasal problems. NECK: Negative for lumps, goiter, pain and significant neck swelling RESPIRATORY: Negative for cough, wheezing CARDIOVASCULAR: Negative for chest pain, leg swelling, palpitations, orthopnea GI: SEE  HPI MUSCULOSKELETAL: Negative for joint pain or swelling, back pain, and muscle pain. SKIN: Negative for lesions, rash PSYCH: Negative for sleep disturbance, mood disorder and recent psychosocial stressors. HEMATOLOGY Negative for prolonged bleeding, bruising easily, and swollen nodes. ENDOCRINE: Negative for cold or heat intolerance, polyuria, polydipsia and goiter. NEURO: negative for tremor, gait imbalance, syncope and seizures. The remainder of the review of systems is noncontributory.   Physical Exam: BP 133/74 (BP Location: Left Arm, Patient Position: Sitting, Cuff Size: Large)   Pulse 93   Temp 98.2 F (36.8 C) (Temporal)   Ht 5' 3.5 (1.613 m)   Wt 166 lb 11.2 oz (75.6 kg)   BMI 29.07 kg/m  GENERAL: The patient is AO x3, in no acute distress. HEENT: Head is normocephalic and atraumatic. EOMI are intact. Mouth is well hydrated and without lesions. NECK: Supple. No masses LUNGS: Clear to auscultation. No presence of rhonchi/wheezing/rales. Adequate chest expansion HEART: RRR, normal s1 and s2. ABDOMEN: Soft, nontender, no guarding, no peritoneal signs, and nondistended. BS +. No masses. EXTREMITIES: Without any cyanosis, clubbing, rash, lesions or edema. NEUROLOGIC: AOx3, no focal motor deficit. SKIN: no jaundice, no rashes   Imaging/Labs: as above  I personally reviewed and interpreted the available labs, imaging and endoscopic files.  Impression and Plan: Christie [name] is a 78 y.o. female PMH vulvar cancer, depression, diabetes, HTN, HLD and IBS-D who presents for follow-up of abdominal discomfort .  Patient has presented chronic abdominal discomfort that has worsened recently without other associated symptoms.  Will try Levsin  at the moment, along with continuing IBgard as prior.  However, given worsening symptoms we will evaluate her symptoms with an EGD.  Reassuringly, her most recent cross-sectional abdominal imaging was unremarkable.  -Start Levsin  1 tablet q6h as  needed for abdominal pain -Schedule EGD  -Can continue IBgard as needed  All questions were answered.      Toribio Fortune, MD Gastroenterology and Hepatology [facility] Gastroenterology

## 2024-08-08 NOTE — Patient Instructions (Signed)
 Start Levsin  1 tablet q6h as needed for abdominal pain Schedule EGD  Can continue IBgard as needed

## 2024-08-08 NOTE — H&P (View-Only) (Signed)
 Toribio Fortune, M.D. Gastroenterology & Hepatology [facility] Gastroenterology [address] [place], [place] [address] Primary Care Physician: Alphonsa [name], MD [address] [place] [address]  Referring MD: PCP  Chief Complaint:  abdominal pain  History of Present Illness: Christie [name] is a 78 y.o. female PMH vulvar cancer, depression, diabetes, HTN, HLD and IBS-D who presents for follow-up of abdominal discomfort .  The patient was last seen on 04/20/2021. At that time, the patient was continued on peppermint as needed for bloating.  Patient reports that for the last couple of months she has presented recurrent episodes of abdominal discomfort after having a meal intake. She reports having the discomfort in her mid abdominal area. Discomfort is present almost every day, but not all the time. She states that she does not have vomiting but sometimes feels nauseated when the discomfort is present. She used to take IBGArd in the past, which helped for abdominal pain in the past but sometimes it does not help completely. Bentyl  was not helping so she stopped this medication.  She has not identified a clear cut food trigger for her symptoms.  She has a BM every day. The patient denies having any nausea, vomiting, fever, chills, hematochezia, melena, hematemesis, abdominal distention, abdominal pain, diarrhea, jaundice, pruritus. Has lost a few lb as she is feeling less appetite than usual.  Further evaluation of her CT abdomen and pelvis without IV contrast performed on 07/10/2024 was completely normal. Labs on 07/15/2024 showed a normal CMP with very mildly increased ALT of 36, lipid panel with mildly increased to glycerides 153, globin A1c 7.0, TSH 1.33.  Last EGD: never Last Colonoscopy:02/10/2021  1 small polyp in the ascending colon which was a tubular adenoma. Random colonic biopsies were negative for microscopic colitis.   FHx: neg for any  gastrointestinal/liver disease, mother had pancreatic cacner, sister had kidney cancer, brother possibly had pancreatic cancer, nephew lung cancer. Social: neg smoking, alcohol or illicit drug use Surgical: cholecystectomy  Past Medical History: Past Medical History:  Diagnosis Date   Cancer (HCC) 1999   external vulva lesion   Depression    DM (diabetes mellitus) (HCC) 2012   oral medications   Elevated liver enzymes    Hearing deficit    History of kidney stones    Hyperlipidemia    Hyperlipidemia associated with type 2 diabetes mellitus (HCC) 02/25/2020   Hypertension 2000   Spastic colon     Past Surgical History: Past Surgical History:  Procedure Laterality Date   ANTERIOR LAT LUMBAR FUSION Right 07/04/2017   Procedure: Right LUMBAR TWO-THREE LUMBAR THREE-FOUR LUMBAR FOUR-FIVE Anterior lateral lumbar interbody fusion with percutaneous pedicle screws;  Surgeon: Unice Pac, MD;  Location: [facility] OR;  Service: Neurosurgery;  Laterality: Right;  Right lateral approach    ANTERIOR LATERAL LUMBAR FUSION WITH PERCUTANEOUS SCREW 3 LEVEL N/A 07/04/2017   Procedure: PERCUTANEOUS SCREW THREE LEVEL LUMBAR TWO-THREE LUMBAR THREE-FOUR LUMBAR FOUR-FIVE;  Surgeon: Unice Pac, MD;  Location: [facility] OR;  Service: Neurosurgery;  Laterality: N/A;  Prone   BACK SURGERY  2004 and 2017   BIOPSY  02/23/2021   Procedure: BIOPSY;  Surgeon: Fortune Angelia Toribio, MD;  [facility]: [facility];  Service: Gastroenterology;;   CHOLECYSTECTOMY     COLONOSCOPY  feb 2005   COLONOSCOPY N/A 02/13/2014   Procedure: COLONOSCOPY;  Surgeon: Claudis RAYMOND Rivet, MD;  [facility]: [facility];  Service: Endoscopy;  Laterality: N/A;  930-moved to [address]  notified pt   COLONOSCOPY WITH PROPOFOL  N/A 02/23/2021   Procedure: COLONOSCOPY WITH PROPOFOL ;  Surgeon: Eartha Angelia Sieving, MD;  [facility]: [facility];  Service: Gastroenterology;  Laterality: N/A;  AM   ESOPHAGOGASTRODUODENOSCOPY  09/2005   EYE SURGERY Right     catatract removed   EYE SURGERY Right    macular   surgery    KIDNEY STONE SURGERY     for a staghorn kidney stone   POLYPECTOMY  02/23/2021   Procedure: POLYPECTOMY;  Surgeon: Eartha Angelia Sieving, MD;  [facility]: [facility];  Service: Gastroenterology;;   STAPEDECTOMY Bilateral    Right 1991 and Left 1994   TOTAL ABDOMINAL HYSTERECTOMY  1982   vulva cancer surgery  1999    Family History: Family History  Problem Relation Age of Onset   Diabetes Father    Hypertension Father    Dementia Father    Pancreatic cancer Mother     Social History: Social History   Tobacco Use  Smoking Status Never  Smokeless Tobacco Never   Social History   Substance and Sexual Activity  Alcohol Use No   Alcohol/week: 0.0 standard drinks of alcohol   Social History   Substance and Sexual Activity  Drug Use No    Allergies: Allergies  Allergen Reactions   Atorvastatin Other (See Comments)    ELEVATED LFT's LIVER ENZYMES GO UP   Metformin And Related Other (See Comments)    REACTION IS SIDE EFFECT ,  UNSPECIFIED REACTION  DOESN'T KNOW WHY THIS IS ON THE LIST   Oxycodone-Acetaminophen  Other (See Comments)   Ace Inhibitors     UNSPECIFIED REACTION     Medications: Current Outpatient Medications  Medication Sig Dispense Refill   acetaminophen  (TYLENOL ) 500 MG tablet Take 1,000 mg by mouth every 6 (six) hours as needed (pain).     alendronate  (FOSAMAX ) 70 MG tablet Take 1 tablet (70 mg total) by mouth once a week. Take with a full glass of water  on an empty stomach. 12 tablet 3   ALPRAZolam  (XANAX ) 0.5 MG tablet TAKE 1 TABLET BY MOUTH AT BEDTIME. 30 tablet 5   amLODipine  (NORVASC ) 10 MG tablet Take 1 tablet (10 mg total) by mouth daily. 90 tablet 1   Ascorbic Acid (VITAMIN C) 1000 MG tablet Take 1,000 mg by mouth daily.     cholecalciferol (VITAMIN D ) 1000 UNITS tablet Take 1,000 Units by mouth daily.     docusate sodium  (COLACE) 100 MG capsule Take 300 mg by mouth at bedtime.      Elderberry-Vitamin C-Zinc (ELDERBERRY EXTRACT PO) Take by mouth. One tablespoon per day.     fluticasone  (FLONASE ) 50 MCG/ACT nasal spray Place 2 sprays into both nostrils daily. (Patient taking differently: Place 2 sprays into both nostrils as needed.) 1 g 0   glipiZIDE  (GLUCOTROL ) 5 MG tablet 2 tablets in the morning and 1 and 1/2 in the evening as directed to a maximum of 2 tablets in the morning and 2 tablets in the evening as directed (Patient taking differently: 2 tablets BID) 360 tablet 1   glucose blood (ACCU-CHEK GUIDE TEST) test strip Use to check blood sugar once daily as directed. 50 strip 2   hydrochlorothiazide  (MICROZIDE ) 12.5 MG capsule Take 1 capsule (12.5 mg total) by mouth daily. 30 capsule 5   Multiple Vitamins-Minerals (MACULAR VITAMIN BENEFIT PO) Take 1 tablet by mouth in the morning and at bedtime.     OVER THE COUNTER MEDICATION IB Guard BID (Patient taking differently:  daily at 6 (six) AM. IB Guard)     rosuvastatin  (CRESTOR ) 40 MG tablet Take 1 tablet (40 mg total) by mouth daily. 90 tablet 1   RYBELSUS  14 MG TABS Take 1 tablet (14 mg total) by mouth daily. 30 tablet 6   sertraline  (ZOLOFT ) 50 MG tablet TAKE (1) TABLET BY MOUTH ONCE DAILY. 90 tablet 1   valsartan  (DIOVAN ) 160 MG tablet Take 1 tablet (160 mg total) by mouth 2 (two) times daily. 90 tablet 3   zinc gluconate 50 MG tablet Take 50 mg by mouth daily.     dicyclomine  (BENTYL ) 10 MG capsule 1 taken 3 times daily as needed for discomfort (Patient not taking: Reported on 08/08/2024) 30 capsule 2   No current facility-administered medications for this visit.    Review of Systems: GENERAL: negative for malaise, night sweats HEENT: No changes in hearing or vision, no nose bleeds or other nasal problems. NECK: Negative for lumps, goiter, pain and significant neck swelling RESPIRATORY: Negative for cough, wheezing CARDIOVASCULAR: Negative for chest pain, leg swelling, palpitations, orthopnea GI: SEE  HPI MUSCULOSKELETAL: Negative for joint pain or swelling, back pain, and muscle pain. SKIN: Negative for lesions, rash PSYCH: Negative for sleep disturbance, mood disorder and recent psychosocial stressors. HEMATOLOGY Negative for prolonged bleeding, bruising easily, and swollen nodes. ENDOCRINE: Negative for cold or heat intolerance, polyuria, polydipsia and goiter. NEURO: negative for tremor, gait imbalance, syncope and seizures. The remainder of the review of systems is noncontributory.   Physical Exam: BP 133/74 (BP Location: Left Arm, Patient Position: Sitting, Cuff Size: Large)   Pulse 93   Temp 98.2 F (36.8 C) (Temporal)   Ht 5' 3.5 (1.613 m)   Wt 166 lb 11.2 oz (75.6 kg)   BMI 29.07 kg/m  GENERAL: The patient is AO x3, in no acute distress. HEENT: Head is normocephalic and atraumatic. EOMI are intact. Mouth is well hydrated and without lesions. NECK: Supple. No masses LUNGS: Clear to auscultation. No presence of rhonchi/wheezing/rales. Adequate chest expansion HEART: RRR, normal s1 and s2. ABDOMEN: Soft, nontender, no guarding, no peritoneal signs, and nondistended. BS +. No masses. EXTREMITIES: Without any cyanosis, clubbing, rash, lesions or edema. NEUROLOGIC: AOx3, no focal motor deficit. SKIN: no jaundice, no rashes   Imaging/Labs: as above  I personally reviewed and interpreted the available labs, imaging and endoscopic files.  Impression and Plan: Christie [name] is a 78 y.o. female PMH vulvar cancer, depression, diabetes, HTN, HLD and IBS-D who presents for follow-up of abdominal discomfort .  Patient has presented chronic abdominal discomfort that has worsened recently without other associated symptoms.  Will try Levsin  at the moment, along with continuing IBgard as prior.  However, given worsening symptoms we will evaluate her symptoms with an EGD.  Reassuringly, her most recent cross-sectional abdominal imaging was unremarkable.  -Start Levsin  1 tablet q6h as  needed for abdominal pain -Schedule EGD  -Can continue IBgard as needed  All questions were answered.      Toribio Fortune, MD Gastroenterology and Hepatology [facility] Gastroenterology

## 2024-08-12 ENCOUNTER — Encounter: Payer: Self-pay | Admitting: *Deleted

## 2024-08-12 ENCOUNTER — Telehealth: Payer: Self-pay | Admitting: *Deleted

## 2024-08-12 NOTE — Telephone Encounter (Signed)
 [facility] PA for EGD: Approved Authorization #[id number]  Tracking #[id number] Dates of service 08/22/2024 - 11/21/2024

## 2024-08-16 ENCOUNTER — Encounter (HOSPITAL_COMMUNITY): Payer: Self-pay

## 2024-08-16 ENCOUNTER — Other Ambulatory Visit: Payer: Self-pay

## 2024-08-20 ENCOUNTER — Ambulatory Visit (HOSPITAL_COMMUNITY)
Admission: RE | Admit: 2024-08-20 | Discharge: 2024-08-20 | Disposition: A | Discharge status: Home / Self Care | Source: Ambulatory Visit | Attending: Family Medicine | Admitting: Family Medicine

## 2024-08-20 ENCOUNTER — Encounter (HOSPITAL_COMMUNITY)
Admission: RE | Admit: 2024-08-20 | Discharge: 2024-08-20 | Disposition: A | Discharge status: Home / Self Care | Source: Ambulatory Visit | Attending: Gastroenterology | Admitting: Gastroenterology

## 2024-08-20 DIAGNOSIS — Z13820 Encounter for screening for osteoporosis: Secondary | ICD-10-CM | POA: Insufficient documentation

## 2024-08-20 DIAGNOSIS — M810 Age-related osteoporosis without current pathological fracture: Secondary | ICD-10-CM | POA: Insufficient documentation

## 2024-08-20 DIAGNOSIS — Z780 Asymptomatic menopausal state: Secondary | ICD-10-CM | POA: Diagnosis not present

## 2024-08-20 HISTORY — DX: Cardiac murmur, unspecified: R01.1

## 2024-08-21 NOTE — Anesthesia Preprocedure Evaluation (Signed)
 Anesthesia Evaluation  Patient identified by MRN, date of birth, ID band Patient awake    Reviewed: Allergy & Precautions, NPO status , Patient's Chart, lab work & pertinent test results  History of Anesthesia Complications Negative for: history of anesthetic complications  Airway Mallampati: II  TM Distance: >3 FB Neck ROM: Full    Dental  (+) Dental Advisory Given, Caps Crowns :   Pulmonary neg pulmonary ROS   Pulmonary exam normal breath sounds clear to auscultation       Cardiovascular Exercise Tolerance: Good hypertension, Pt. on medications Normal cardiovascular exam+ Valvular Problems/Murmurs (mild) AS  Rhythm:Regular Rate:Normal + Systolic murmurs (mild aortic stenosis as per echo) 1. Left ventricular ejection fraction, by estimation, is 60 to 65%. The left ventricle has normal function. The left ventricle has no regional wall motion abnormalities. Left ventricular diastolic parameters are consistent with Grade I diastolic dysfunction (impaired relaxation).  2. Right ventricular systolic function is normal. The right ventricular size is normal. There is normal pulmonary artery systolic pressure. The estimated right ventricular systolic pressure is 31.3 mmHg.  3. Left atrial size was mildly dilated.  4. The mitral valve is grossly normal. Trivial mitral valve  regurgitation.  5. The aortic valve is tricuspid. Aortic valve regurgitation is mild. Mild aortic valve stenosis. Aortic regurgitation PHT measures 539 msec. Aortic valve mean gradient measures 12.0 mmHg. Aortic valve Vmax measures 2.46 m/s.  6. The inferior vena cava is normal in size with greater than 50% respiratory variability, suggesting right atrial pressure of 3 mmHg.    Neuro/Psych  PSYCHIATRIC DISORDERS  Depression    negative neurological ROS     GI/Hepatic negative GI ROS, Neg liver ROS,,,  Endo/Other  diabetes, Well Controlled, Type 2, Oral  Hypoglycemic Agents    Renal/GU      Musculoskeletal  (+) Arthritis  (back sx), Osteoarthritis,    Abdominal   Peds  Hematology negative hematology ROS (+)   Anesthesia Other Findings   Reproductive/Obstetrics negative OB ROS                              Anesthesia Physical Anesthesia Plan  ASA: 2  Anesthesia Plan: General   Post-op Pain Management: Minimal or no pain anticipated   Induction: Intravenous  PONV Risk Score and Plan: Propofol  infusion  Airway Management Planned: Nasal Cannula and Natural Airway  Additional Equipment: None  Intra-op Plan:   Post-operative Plan:   Informed Consent: I have reviewed the patients History and Physical, chart, labs and discussed the procedure including the risks, benefits and alternatives for the proposed anesthesia with the patient or authorized representative who has indicated his/her understanding and acceptance.     Dental advisory given  Plan Discussed with: CRNA  Anesthesia Plan Comments:          Anesthesia Quick Evaluation

## 2024-08-22 ENCOUNTER — Encounter (HOSPITAL_COMMUNITY)
Admission: RE | Disposition: A | Discharge status: Home / Self Care | Payer: Self-pay | Source: Home / Self Care | Attending: Gastroenterology

## 2024-08-22 ENCOUNTER — Ambulatory Visit (HOSPITAL_COMMUNITY): Payer: Self-pay | Admitting: Anesthesiology

## 2024-08-22 ENCOUNTER — Encounter (HOSPITAL_COMMUNITY): Payer: Self-pay | Admitting: Gastroenterology

## 2024-08-22 ENCOUNTER — Ambulatory Visit (HOSPITAL_BASED_OUTPATIENT_CLINIC_OR_DEPARTMENT_OTHER): Payer: Self-pay | Admitting: Anesthesiology

## 2024-08-22 ENCOUNTER — Other Ambulatory Visit: Payer: Self-pay

## 2024-08-22 ENCOUNTER — Ambulatory Visit (HOSPITAL_COMMUNITY)
Admission: RE | Admit: 2024-08-22 | Discharge: 2024-08-22 | Disposition: A | Discharge status: Home / Self Care | Attending: Gastroenterology | Admitting: Gastroenterology

## 2024-08-22 DIAGNOSIS — Z7984 Long term (current) use of oral hypoglycemic drugs: Secondary | ICD-10-CM | POA: Diagnosis not present

## 2024-08-22 DIAGNOSIS — Z8544 Personal history of malignant neoplasm of other female genital organs: Secondary | ICD-10-CM | POA: Diagnosis not present

## 2024-08-22 DIAGNOSIS — E785 Hyperlipidemia, unspecified: Secondary | ICD-10-CM | POA: Insufficient documentation

## 2024-08-22 DIAGNOSIS — I350 Nonrheumatic aortic (valve) stenosis: Secondary | ICD-10-CM | POA: Insufficient documentation

## 2024-08-22 DIAGNOSIS — K3189 Other diseases of stomach and duodenum: Secondary | ICD-10-CM

## 2024-08-22 DIAGNOSIS — F32A Depression, unspecified: Secondary | ICD-10-CM | POA: Diagnosis not present

## 2024-08-22 DIAGNOSIS — R109 Unspecified abdominal pain: Secondary | ICD-10-CM | POA: Insufficient documentation

## 2024-08-22 DIAGNOSIS — K580 Irritable bowel syndrome with diarrhea: Secondary | ICD-10-CM | POA: Insufficient documentation

## 2024-08-22 DIAGNOSIS — I10 Essential (primary) hypertension: Secondary | ICD-10-CM | POA: Insufficient documentation

## 2024-08-22 DIAGNOSIS — E119 Type 2 diabetes mellitus without complications: Secondary | ICD-10-CM | POA: Insufficient documentation

## 2024-08-22 HISTORY — PX: ESOPHAGOGASTRODUODENOSCOPY: SHX5428

## 2024-08-22 LAB — GLUCOSE, CAPILLARY: Glucose-Capillary: 178 mg/dL — ABNORMAL HIGH (ref 70–99)

## 2024-08-22 SURGERY — EGD (ESOPHAGOGASTRODUODENOSCOPY)
Anesthesia: General

## 2024-08-22 MED ORDER — OMEPRAZOLE 40 MG PO CPDR: 40.0000 mg | DELAYED_RELEASE_CAPSULE | Freq: Every day | ORAL | 3 refills | Status: DC

## 2024-08-22 MED ORDER — LACTATED RINGERS IV SOLN: INTRAVENOUS | Status: DC

## 2024-08-22 MED ORDER — PROPOFOL 10 MG/ML IV BOLUS
INTRAVENOUS | Status: DC | PRN
  Administered 2024-08-22: 30 mg via INTRAVENOUS
  Administered 2024-08-22: 100 mg via INTRAVENOUS

## 2024-08-22 MED ORDER — LIDOCAINE 2% (20 MG/ML) 5 ML SYRINGE
INTRAMUSCULAR | Status: DC | PRN
  Administered 2024-08-22: 50 mg via INTRAVENOUS

## 2024-08-22 NOTE — Discharge Instructions (Signed)
You are being discharged to home.  Resume your previous diet.  We are waiting for your pathology results.  Take Prilosec (omeprazole) 40 mg by mouth once a day.  

## 2024-08-22 NOTE — Anesthesia Postprocedure Evaluation (Signed)
 Anesthesia Post Note  Patient: SRUTHI MAURER  Procedure(s) Performed: EGD (ESOPHAGOGASTRODUODENOSCOPY)  Patient location during evaluation: Short Stay Anesthesia Type: General Level of consciousness: awake and alert Pain management: pain level controlled Vital Signs Assessment: post-procedure vital signs reviewed and stable Respiratory status: spontaneous breathing Cardiovascular status: blood pressure returned to baseline and stable Postop Assessment: no apparent nausea or vomiting Anesthetic complications: no   No notable events documented.   Last Vitals:  Vitals:   08/22/24 0827 08/22/24 0832  BP: (!) 121/49 (!) 119/56  Pulse: 75   Resp: (!) 25   Temp: 36.6 C   SpO2: 100%     Last Pain:  Vitals:   08/22/24 0827  TempSrc: Oral  PainSc: 0-No pain                 Andee Chivers

## 2024-08-22 NOTE — Op Note (Signed)
 [facility] Patient Name: Christie [name] Procedure Date: 08/22/2024 7:57 AM MRN: [id number] Date of Birth: 13-Dec-1946 Attending MD: Toribio Fortune , , [id number] CSN: [id number] Age: 78 Admit Type: Outpatient Procedure:                Upper GI endoscopy Indications:              Abdominal pain Providers:                Toribio Fortune, Harlene Nydia Rosina Jackolyn Referring MD:              Medicines:                Monitored Anesthesia Care Complications:            No immediate complications. Estimated Blood Loss:     Estimated blood loss: none. Procedure:                Pre-Anesthesia Assessment:                           - Prior to the procedure, a History and Physical                            was performed, and patient medications, allergies                            and sensitivities were reviewed. The patient's                            tolerance of previous anesthesia was reviewed.                           - The risks and benefits of the procedure and the                            sedation options and risks were discussed with the                            patient. All questions were answered and informed                            consent was obtained.                           - ASA Grade Assessment: II - A patient with mild                            systemic disease.                           After obtaining informed consent, the endoscope was                            passed under direct vision. Throughout the                            procedure, the patient's blood pressure, pulse,  and                            oxygen saturations were monitored continuously. The                            [id number]-[id number] ([id number]) Upper was introduced through                            the mouth, and advanced to the second part of                            duodenum. The upper GI endoscopy was accomplished                            without difficulty. The patient tolerated the                             procedure well. Scope In: 8:15:21 AM Scope Out: 8:21:00 AM Total Procedure Duration: 0 hours 5 minutes 39 seconds  Findings:      The examined esophagus was normal.      Diffuse congested mucosa was found in the gastric antrum. Biopsies were       taken with a cold forceps for Helicobacter pylori testing.      The examined duodenum was normal. Biopsies were taken with a cold       forceps for histology. Impression:               - Normal esophagus.                           - Congestive gastropathy. Biopsied.                           - Normal examined duodenum. Biopsied. Moderate Sedation:      Per Anesthesia Care Recommendation:           - Discharge patient to home (ambulatory).                           - Resume previous diet.                           - Await pathology results.                           - Use Prilosec (omeprazole ) 40 mg PO daily. Procedure Code(s):        --- Professional ---                           [phone], Esophagogastroduodenoscopy, flexible,                            transoral; with biopsy, single or multiple Diagnosis Code(s):        --- Professional ---                           K31.89, Other diseases  of stomach and duodenum                           R10.9, Unspecified abdominal pain CPT copyright 2022 [employer]. All rights reserved. The codes documented in this report are preliminary and upon coder review may  be revised to meet current compliance requirements. Toribio Fortune, MD Toribio Fortune,  08/22/2024 8:25:38 AM This report has been signed electronically. Number of Addenda: 0

## 2024-08-22 NOTE — Transfer of Care (Signed)
 Immediate Anesthesia Transfer of Care Note  Patient: Christie [name]  Procedure(s) Performed: EGD (ESOPHAGOGASTRODUODENOSCOPY)  Patient Location: Short Stay  Anesthesia Type:General  Level of Consciousness: awake  Airway & Oxygen Therapy: Patient Spontanous Breathing  Post-op Assessment: Report given to RN  Post vital signs: Reviewed and stable  Last Vitals:  Vitals Value Taken Time  BP    Temp    Pulse    Resp    SpO2      Last Pain:  Vitals:   08/22/24 0811  TempSrc:   PainSc: 4          Complications: No notable events documented.

## 2024-08-22 NOTE — Interval H&P Note (Signed)
 History and Physical Interval Note:  08/22/2024 7:32 AM  Christie [name]  has presented today for surgery, with the diagnosis of abdominal discomfort.  The various methods of treatment have been discussed with the patient and family. After consideration of risks, benefits and other options for treatment, the patient has consented to  Procedure(s) with comments: EGD (ESOPHAGOGASTRODUODENOSCOPY) (N/A) - 8:00 am, asa 1-2 as a surgical intervention.  The patient's history has been reviewed, patient examined, no change in status, stable for surgery.  I have reviewed the patient's chart and labs.  Questions were answered to the patient's satisfaction.     [name]

## 2024-08-25 ENCOUNTER — Other Ambulatory Visit: Payer: Self-pay | Admitting: Family Medicine

## 2024-08-25 LAB — SURGICAL PATHOLOGY

## 2024-08-26 ENCOUNTER — Encounter (HOSPITAL_COMMUNITY): Payer: Self-pay | Admitting: Gastroenterology

## 2024-08-26 ENCOUNTER — Encounter (INDEPENDENT_AMBULATORY_CARE_PROVIDER_SITE_OTHER): Payer: Self-pay | Admitting: *Deleted

## 2024-08-26 ENCOUNTER — Ambulatory Visit: Payer: Self-pay | Admitting: Gastroenterology

## 2024-08-26 NOTE — Progress Notes (Signed)
 Patient result letter mailed Patient's PCP is on EPIC

## 2024-08-27 ENCOUNTER — Telehealth (INDEPENDENT_AMBULATORY_CARE_PROVIDER_SITE_OTHER): Payer: Self-pay | Admitting: Gastroenterology

## 2024-08-27 NOTE — Telephone Encounter (Signed)
 Pt left message on voicemail wondering when she would here something back. Please advise. Thank you!

## 2024-08-27 NOTE — Telephone Encounter (Signed)
 Pt called in to office. Pt states she had upper endoscopy 08/22/24 and is still having discomfort in stomach. Pt states she is not feeling well, no appetite, no energy and just not wanting to do anything. Pt states yesterday she hurt all day. Has tried to identify if any foods were causing pain but has not found anything. Pt is taking Omeprazole  once daily. Please advise. Thank you!!

## 2024-08-28 ENCOUNTER — Other Ambulatory Visit (INDEPENDENT_AMBULATORY_CARE_PROVIDER_SITE_OTHER): Payer: Self-pay | Admitting: Gastroenterology

## 2024-08-28 ENCOUNTER — Encounter: Payer: Self-pay | Admitting: Family Medicine

## 2024-08-28 ENCOUNTER — Other Ambulatory Visit: Payer: Self-pay | Admitting: Family Medicine

## 2024-08-28 ENCOUNTER — Telehealth: Payer: Self-pay | Admitting: Family Medicine

## 2024-08-28 DIAGNOSIS — R110 Nausea: Secondary | ICD-10-CM

## 2024-08-28 MED ORDER — ONDANSETRON HCL 4 MG PO TABS: 4.0000 mg | ORAL_TABLET | Freq: Three times a day (TID) | ORAL | 1 refills | Status: DC | PRN

## 2024-08-28 NOTE — Telephone Encounter (Signed)
 Noted

## 2024-08-28 NOTE — Telephone Encounter (Signed)
 Spoke to the patient today, she stated that after starting omeprazole  she started feeling much more frequent symptoms of nausea without vomiting.  Has not had any relieving her abdominal pain, but Levsin  has led to some improvement of this occasionally. I advised her to stop taking the omeprazole  at this point as that she may be presenting side effects from it.  I will send some Zofran  she can take as needed to relieve her symptoms. We also discussed that some of her worsening discomfort could be related to the intake of Rybelsus .  GLP-1 agonists are known to cause different gastrointestinal complaints.  I advised her to follow closely with her PCP to discuss if possible to change to her medication

## 2024-08-28 NOTE — Telephone Encounter (Signed)
 Patient has called multiple times this morning for an update. Please advise. Thanks

## 2024-08-28 NOTE — Telephone Encounter (Signed)
 GI doctor took her off her Rybelsus  May need to go on long-acting insulin  Will call patient

## 2024-08-29 ENCOUNTER — Encounter: Payer: Self-pay | Admitting: Family Medicine

## 2024-08-29 ENCOUNTER — Other Ambulatory Visit: Payer: Self-pay | Admitting: Family Medicine

## 2024-08-29 ENCOUNTER — Other Ambulatory Visit: Payer: Self-pay

## 2024-08-29 DIAGNOSIS — Z79899 Other long term (current) drug therapy: Secondary | ICD-10-CM

## 2024-08-29 DIAGNOSIS — E119 Type 2 diabetes mellitus without complications: Secondary | ICD-10-CM

## 2024-08-29 MED ORDER — EMPAGLIFLOZIN 10 MG PO TABS: 10.0000 mg | ORAL_TABLET | Freq: Every day | ORAL | 3 refills | Status: DC

## 2024-08-29 NOTE — Telephone Encounter (Signed)
 Nurses-please order metabolic 7-patient aware to do this in 10 to 12 days   Long discussion with patient Rybelsus  causing side effects Stop Rybelsus  Start Jardiance  10 mg every morning Patient understands to do metabolic assessment in 10-12/14 days Patient to send us  glucose updates every 7 to 12 days Patient will try to eat healthy Side effects discussed Reduce glipizide  to 1-1/2 tablets twice daily Patient to give us  feedback if not tolerating Jardiance 

## 2024-08-29 NOTE — Telephone Encounter (Signed)
 Orders placed as indicated in chart per dr notes

## 2024-08-29 NOTE — Telephone Encounter (Signed)
 Spoke with patient today see MyChart message and telephone message

## 2024-08-30 ENCOUNTER — Other Ambulatory Visit: Payer: Self-pay | Admitting: Family Medicine

## 2024-09-10 DIAGNOSIS — Z79899 Other long term (current) drug therapy: Secondary | ICD-10-CM | POA: Diagnosis not present

## 2024-09-10 DIAGNOSIS — E119 Type 2 diabetes mellitus without complications: Secondary | ICD-10-CM | POA: Diagnosis not present

## 2024-09-11 ENCOUNTER — Ambulatory Visit: Admitting: Family Medicine

## 2024-09-11 ENCOUNTER — Ambulatory Visit: Payer: Self-pay | Admitting: Family Medicine

## 2024-09-11 VITALS — BP 138/70 | HR 83 | Temp 97.3°F | Ht 63.5 in | Wt 156.0 lb

## 2024-09-11 DIAGNOSIS — B379 Candidiasis, unspecified: Secondary | ICD-10-CM

## 2024-09-11 DIAGNOSIS — Z7984 Long term (current) use of oral hypoglycemic drugs: Secondary | ICD-10-CM

## 2024-09-11 DIAGNOSIS — R6881 Early satiety: Secondary | ICD-10-CM | POA: Diagnosis not present

## 2024-09-11 DIAGNOSIS — R109 Unspecified abdominal pain: Secondary | ICD-10-CM

## 2024-09-11 DIAGNOSIS — E119 Type 2 diabetes mellitus without complications: Secondary | ICD-10-CM | POA: Diagnosis not present

## 2024-09-11 DIAGNOSIS — Z79899 Other long term (current) drug therapy: Secondary | ICD-10-CM | POA: Diagnosis not present

## 2024-09-11 DIAGNOSIS — K921 Melena: Secondary | ICD-10-CM | POA: Diagnosis not present

## 2024-09-11 LAB — BASIC METABOLIC PANEL WITH GFR
BUN/Creatinine Ratio: 24 (ref 12–28)
BUN: 18 mg/dL (ref 8–27)
CO2: 25 mmol/L (ref 20–29)
Calcium: 10.3 mg/dL (ref 8.7–10.3)
Chloride: 99 mmol/L (ref 96–106)
Creatinine, Ser: 0.75 mg/dL (ref 0.57–1.00)
Glucose: 175 mg/dL — ABNORMAL HIGH (ref 70–99)
Potassium: 3.5 mmol/L (ref 3.5–5.2)
Sodium: 142 mmol/L (ref 134–144)
eGFR: 80 mL/min/1.73 (ref >59)

## 2024-09-11 MED ORDER — FLUCONAZOLE 150 MG PO TABS: 150.0000 mg | ORAL_TABLET | Freq: Once | ORAL | 1 refills | Status: AC

## 2024-09-11 MED ORDER — RYBELSUS 3 MG PO TABS: 3.0000 mg | ORAL_TABLET | Freq: Every day | ORAL | 5 refills | Status: DC

## 2024-09-11 NOTE — Progress Notes (Signed)
   Subjective:    Patient ID: Christie [name], female    DOB: Apr 13, 1946, 78 y.o.   MRN: [id number]  HPI  Nausea, low appetite, abd pain , low energy, yeast infection  Recent endoscopy  Believes jardiance  may be causing yeast infection - itching and discharge a week  Review of Systems     Objective:   Physical Exam  General-in no acute distress Eyes-no discharge Lungs-respiratory rate normal, CTA CV-no murmurs,RRR Extremities skin warm dry no edema Neuro grossly normal Behavior normal, alert       Assessment & Plan:  1. Blood in stool (Primary) Patient has intermittent abdominal pain as well as occasional blood in the stool she thinks it is due to hemorrhoids but we will check a stool test - IFOBT POC (occult bld, rslt in office); Future  2. Yeast infection She relates a yeast infection could be related to her medication we will go ahead and treat with Diflucan  We will go ahead and stop Jardiance  3. Abdominal discomfort Will check lab work Possibility of celiac disease Recent EGD biopsy looked good though Also possibility of gastroparesis as well Perhaps consider gastric emptying study depending on results of these attacks - CBC with Differential - C-reactive protein - Tissue Transglutaminase Abs,IgG,IgA  4. Diabetes mellitus without complication (HCC) Will go ahead and start Rybelsus  3 mg she states she has not noticed any difference in her appetite or nausea since stopping Rybelsus  she would like to restart and she would like to get away from Jardiance  - CBC with Differential - C-reactive protein - Tissue Transglutaminase Abs,IgG,IgA  5. High risk medication use Labs ordered - CBC with Differential - C-reactive protein - Tissue Transglutaminase Abs,IgG,IgA  6. Early satiety See discussion above possibility of gastric emptying study follow-up in December for wellness checkup Consider lab work in November to follow-up on diabetes

## 2024-09-16 LAB — TISSUE TRANSGLUTAMINASE ABS,IGG,IGA
Tissue Transglut Ab: <2 U/mL (ref 0–5)
Transglutaminase IgA: <2 U/mL (ref 0–3)

## 2024-09-16 LAB — CBC WITH DIFFERENTIAL/PLATELET
Basophils Absolute: 0 x10E3/uL (ref 0.0–0.2)
Basos: 0 %
EOS (ABSOLUTE): 0.1 x10E3/uL (ref 0.0–0.4)
Eos: 1 %
Hematocrit: 42.1 % (ref 34.0–46.6)
Hemoglobin: 13.9 g/dL (ref 11.1–15.9)
Immature Grans (Abs): 0 x10E3/uL (ref 0.0–0.1)
Immature Granulocytes: 0 %
Lymphocytes Absolute: 2.1 x10E3/uL (ref 0.7–3.1)
Lymphs: 21 %
MCH: 31.5 pg (ref 26.6–33.0)
MCHC: 33 g/dL (ref 31.5–35.7)
MCV: 96 fL (ref 79–97)
Monocytes Absolute: 0.9 x10E3/uL (ref 0.1–0.9)
Monocytes: 9 %
Neutrophils Absolute: 6.8 x10E3/uL (ref 1.4–7.0)
Neutrophils: 69 %
Platelets: 221 x10E3/uL (ref 150–450)
RBC: 4.41 x10E6/uL (ref 3.77–5.28)
RDW: 11.8 % (ref 11.7–15.4)
WBC: 10 x10E3/uL (ref 3.4–10.8)

## 2024-09-18 ENCOUNTER — Encounter: Payer: Self-pay | Admitting: Family Medicine

## 2024-09-18 ENCOUNTER — Ambulatory Visit: Payer: Self-pay | Admitting: Family Medicine

## 2024-09-19 ENCOUNTER — Ambulatory Visit: Payer: Self-pay | Admitting: Family Medicine

## 2024-09-19 ENCOUNTER — Ambulatory Visit: Admitting: Family Medicine

## 2024-09-19 VITALS — BP 133/72 | HR 84 | Wt 154.0 lb

## 2024-09-19 DIAGNOSIS — R109 Unspecified abdominal pain: Secondary | ICD-10-CM | POA: Diagnosis not present

## 2024-09-19 MED ORDER — HYDROCHLOROTHIAZIDE 12.5 MG PO CAPS: 12.5000 mg | ORAL_CAPSULE | Freq: Every day | ORAL | 5 refills | Status: DC

## 2024-09-19 NOTE — Progress Notes (Signed)
   Subjective:    Patient ID: Christie [name], female    DOB: 1946-01-15, 78 y.o.   MRN: [id number] CC: Follow up for stomach pain  HPI 78 year old woman, arrived for a follow up visit regarding stomach pain that began in July, 2025. Since she has had a CT, endoscopy and US  and all were unremarkable. She feels that the stomach pain has recently increased since her endoscopy in September. He pain consistent of aching 10/10 midsection pain that is worst in the am. She has been taking advil for the pain. She does not feel that eating makes it better or worst and she has been eating minimal due to nausea. Her eating habits include oatmeal in the morning and meals that she can not finish for lunch and dinner. Her activity has declined due to the stomach pain but she does not feel as if any activity makes it better or worst. The stomach pain does not effect her sleep and she reports sleeping well.   In addition, she has not had a bowel movement in over a week. She reports taking miralax  every other day.    Review of Systems  Constitutional:  Positive for appetite change. Negative for activity change and fever.  HENT:  Negative for congestion and trouble swallowing.   Respiratory:  Negative for cough, shortness of breath and wheezing.   Cardiovascular:  Negative for chest pain.  Gastrointestinal:  Positive for constipation and nausea. Negative for abdominal pain, diarrhea and vomiting.  Genitourinary:  Negative for difficulty urinating, dysuria, frequency, pelvic pain, urgency, vaginal discharge and vaginal pain.  Skin:  Negative for rash.  Neurological:  Negative for headaches.  Psychiatric/Behavioral:  Negative for sleep disturbance.        Objective:   Physical Exam Vitals and nursing note reviewed.  Constitutional:      General: She is not in acute distress. Cardiovascular:     Rate and Rhythm: Normal rate and regular rhythm.  Pulmonary:     Effort: Pulmonary effort is normal.     Breath  sounds: Normal breath sounds.  Abdominal:     General: Bowel sounds are normal.     Palpations: Abdomen is soft.     Tenderness: There is no abdominal tenderness.  Skin:    General: Skin is warm and dry.  Psychiatric:        Mood and Affect: Mood normal.        Behavior: Behavior normal.    Vitals:   09/19/24 1309  BP: 133/72  Pulse: 84  Weight: 69.9 kg  SpO2: 95%  BMI (Calculated): 26.85          Assessment & Plan:  1. Abdominal discomfort (Primary) Patient educated to stop taking advil and try to relieve any pain she has with tylenol .  - Basic Metabolic Panel (BMET) - Lipase - Hepatic function panel - CBC with Differential/Platelet - US  ABDOMEN LIMITED RUQ (LIVER/GB)

## 2024-09-19 NOTE — Progress Notes (Signed)
   Subjective:    Patient ID: Christie [name], female    DOB: 1946-03-06, 78 y.o.   MRN: [id number]  HPI  Patient with ongoing abdominal pain since last being seen having some constipation mid abdominal pain mild epigastric pain relates does not feel like eating much denies high fever chills no vomiting some nausea no diarrhea moderate constipation last 6 days Recently restarted on Rybelsus  Was previously on Rybelsus  and then was taken off due to reoccurring abdominal pain Then that did not make any difference in her symptoms so at that point her sugars were going up-shared discussion restarted Rybelsus  Has been having ongoing abdominal pain now with constipation  Review of Systems     Objective:   Physical Exam General-in no acute distress Eyes-no discharge Lungs-respiratory rate normal, CTA CV-no murmurs,RRR Extremities skin warm dry no edema Neuro grossly normal Behavior normal, alert  Abdomen is soft no guarding rebound or tenderness no masses felt  Previous CAT scan previous labs reviewed    Assessment & Plan:  Ongoing abdominal pain Lab work ordered Right upper quadrant ultrasound because of epigastric pain to look at the common bile duct as well as liver and pancreas If this is negative then next step would be CTA of the intestine arteries If that is negative consider pill picture endoscopy capsule If that is negative consider colonoscopy  I encourage patient to stop the Rybelsus  We may start Januvia but we will wait to see what lab work shows first Continue glipizide  1/2 tablet twice a day

## 2024-09-20 ENCOUNTER — Other Ambulatory Visit: Payer: Self-pay | Admitting: Family Medicine

## 2024-09-20 ENCOUNTER — Ambulatory Visit (HOSPITAL_COMMUNITY)
Admission: RE | Admit: 2024-09-20 | Discharge: 2024-09-20 | Disposition: A | Discharge status: Home / Self Care | Source: Ambulatory Visit | Attending: Family Medicine | Admitting: Family Medicine

## 2024-09-20 ENCOUNTER — Telehealth: Payer: Self-pay | Admitting: Family Medicine

## 2024-09-20 ENCOUNTER — Other Ambulatory Visit: Payer: Self-pay

## 2024-09-20 ENCOUNTER — Encounter: Payer: Self-pay | Admitting: Family Medicine

## 2024-09-20 DIAGNOSIS — R109 Unspecified abdominal pain: Secondary | ICD-10-CM

## 2024-09-20 LAB — HEPATIC FUNCTION PANEL
ALT: 34 IU/L — ABNORMAL HIGH (ref 0–32)
AST: 27 IU/L (ref 0–40)
Albumin: 4.3 g/dL (ref 3.8–4.8)
Alkaline Phosphatase: 86 IU/L (ref 49–135)
Bilirubin Total: 0.4 mg/dL (ref 0.0–1.2)
Bilirubin, Direct: 0.2 mg/dL (ref 0.00–0.40)
Total Protein: 6.8 g/dL (ref 6.0–8.5)

## 2024-09-20 LAB — BASIC METABOLIC PANEL WITH GFR
BUN/Creatinine Ratio: 25 (ref 12–28)
BUN: 17 mg/dL (ref 8–27)
CO2: 28 mmol/L (ref 20–29)
Calcium: 10.2 mg/dL (ref 8.7–10.3)
Chloride: 92 mmol/L — ABNORMAL LOW (ref 96–106)
Creatinine, Ser: 0.67 mg/dL (ref 0.57–1.00)
Glucose: 179 mg/dL — ABNORMAL HIGH (ref 70–99)
Potassium: 3 mmol/L — ABNORMAL LOW (ref 3.5–5.2)
Sodium: 139 mmol/L (ref 134–144)
eGFR: 88 mL/min/1.73 (ref >59)

## 2024-09-20 LAB — CBC WITH DIFFERENTIAL/PLATELET
Basophils Absolute: 0 x10E3/uL (ref 0.0–0.2)
Basos: 0 %
EOS (ABSOLUTE): 0 x10E3/uL (ref 0.0–0.4)
Eos: 0 %
Hematocrit: 43.9 % (ref 34.0–46.6)
Hemoglobin: 14.1 g/dL (ref 11.1–15.9)
Immature Grans (Abs): 0 x10E3/uL (ref 0.0–0.1)
Immature Granulocytes: 0 %
Lymphocytes Absolute: 2.3 x10E3/uL (ref 0.7–3.1)
Lymphs: 20 %
MCH: 30.4 pg (ref 26.6–33.0)
MCHC: 32.1 g/dL (ref 31.5–35.7)
MCV: 95 fL (ref 79–97)
Monocytes Absolute: 0.8 x10E3/uL (ref 0.1–0.9)
Monocytes: 7 %
Neutrophils Absolute: 8.1 x10E3/uL — ABNORMAL HIGH (ref 1.4–7.0)
Neutrophils: 73 %
Platelets: 255 x10E3/uL (ref 150–450)
RBC: 4.64 x10E6/uL (ref 3.77–5.28)
RDW: 12.3 % (ref 11.7–15.4)
WBC: 11.4 x10E3/uL — ABNORMAL HIGH (ref 3.4–10.8)

## 2024-09-20 LAB — LIPASE: Lipase: 61 U/L (ref 14–85)

## 2024-09-20 MED ORDER — POTASSIUM CHLORIDE CRYS ER 10 MEQ PO TBCR: EXTENDED_RELEASE_TABLET | ORAL | 0 refills | Status: DC

## 2024-09-20 NOTE — Telephone Encounter (Signed)
 Orders placed and message sent to referral team

## 2024-09-20 NOTE — Telephone Encounter (Signed)
 Nurses Patient with ongoing abdominal pain Needs further evaluation for possibility of mesenteric ischemia  Recommend CT angio abdomen pelvis with and without contrast This is to look at her mesenteric arteries (Please see last few office visits for further documentation) We need to rule out the possibility of mesenteric artery ischemia/obstruction/buildup  Patient is aware that we are ordering this-please have referral team to work on setting this up for next week preferably Needs to be read stat so that we will get results in a timely manner

## 2024-09-22 ENCOUNTER — Telehealth: Payer: Self-pay | Admitting: Family Medicine

## 2024-09-22 NOTE — Telephone Encounter (Signed)
 Nurses Please order the following CTA abdomen pelvis to look at the mesenteric arteries of the intestines Reason-abdominal pain, possible intestinal ischemia  Patient request that this can be completed either this week or October 13 or 14 for the week of the 20th or week of the 27th  From a clinical standpoint as long as the scan is completed in October that would be fine with me I prefer for it to be read stat so we have the results  (Also on the side note she is taking potassium for the low potassium and she will send us  glucose readings 1 week time may have to add Januvia)

## 2024-09-23 NOTE — Telephone Encounter (Signed)
 Yes please

## 2024-09-23 NOTE — Telephone Encounter (Signed)
 See chart

## 2024-09-24 ENCOUNTER — Ambulatory Visit: Payer: Self-pay

## 2024-09-24 NOTE — Telephone Encounter (Signed)
 FYI Only or Action Required?: FYI only for provider.  Patient was last seen in primary care on 09/19/2024 by Christie [name], MD.  Called Nurse Triage reporting Abdominal Pain.  Symptoms began several months ago.  Interventions attempted: OTC medications: tylenol .  Symptoms are: gradually worsening.  Triage Disposition: See Physician Within 24 Hours  Patient/caregiver understands and will follow disposition?: Yes     Copied from CRM [id number]. Topic: Clinical - Red Word Triage >> Sep 24, 2024 10:02 AM Carlatta H wrote: [facility] that prompted transfer to Nurse Triage: Patient has been feeling worse since last visit on 10/2//Symptoms are stomach pain and loss of appetite Reason for Disposition  [1] MODERATE pain (e.g., interferes with normal activities) AND [2] pain comes and goes (cramps) AND [3] present > 24 hours  (Exception: Pain with Vomiting or Diarrhea - see that Guideline.)  Answer Assessment - Initial Assessment Questions Pt states that she started to feel better but then this morning it started to get worse again. She asked if the potassium could be causing it as she started that Saturday. She states her biggest concern is her lack of energy which has been ongoing.    1. LOCATION: Where does it hurt?      Up at the top of her stomach 2. RADIATION: Does the pain shoot anywhere else? (e.g., chest, back)     No  3. ONSET: When did the pain begin? (e.g., minutes, hours or days ago)      Since July.  4. SUDDEN: Gradual or sudden onset?     Hurting upon waking 5. PATTERN Does the pain come and go, or is it constant?     Constant this am.  6. SEVERITY: How bad is the pain?  (e.g., Scale 1-10; mild, moderate, or severe)     8/10 7. RECURRENT SYMPTOM: Have you ever had this type of stomach pain before? If Yes, ask: When was the last time? and What happened that time?      no 8. CAUSE: What do you think is causing the stomach pain? (e.g., gallstones, recent  abdominal surgery)     unknown 9. RELIEVING/AGGRAVATING FACTORS: What makes it better or worse? (e.g., antacids, bending or twisting motion, bowel movement)     Tylenol  seems to help,  10. OTHER SYMPTOMS: Do you have any other symptoms? (e.g., back pain, diarrhea, fever, urination pain, vomiting)       No, lack of energy States feels heavy but denies any other symptoms.  Protocols used: Abdominal Pain - Female-A-AH

## 2024-09-25 ENCOUNTER — Other Ambulatory Visit: Payer: Self-pay

## 2024-09-25 ENCOUNTER — Ambulatory Visit: Payer: Self-pay | Admitting: Family Medicine

## 2024-09-25 ENCOUNTER — Encounter (HOSPITAL_COMMUNITY): Payer: Self-pay

## 2024-09-25 ENCOUNTER — Emergency Department (HOSPITAL_COMMUNITY)

## 2024-09-25 ENCOUNTER — Emergency Department (HOSPITAL_COMMUNITY)
Admission: EM | Admit: 2024-09-25 | Discharge: 2024-09-25 | Disposition: A | Discharge status: Home / Self Care | Source: Ambulatory Visit | Attending: Emergency Medicine | Admitting: Emergency Medicine

## 2024-09-25 ENCOUNTER — Encounter: Payer: Self-pay | Admitting: Family Medicine

## 2024-09-25 VITALS — BP 114/69 | HR 69 | Temp 96.4°F | Ht 63.5 in | Wt 150.0 lb

## 2024-09-25 DIAGNOSIS — I951 Orthostatic hypotension: Secondary | ICD-10-CM

## 2024-09-25 DIAGNOSIS — I10 Essential (primary) hypertension: Secondary | ICD-10-CM | POA: Diagnosis not present

## 2024-09-25 DIAGNOSIS — Z79899 Other long term (current) drug therapy: Secondary | ICD-10-CM | POA: Insufficient documentation

## 2024-09-25 DIAGNOSIS — I700 Atherosclerosis of aorta: Secondary | ICD-10-CM | POA: Diagnosis not present

## 2024-09-25 DIAGNOSIS — E119 Type 2 diabetes mellitus without complications: Secondary | ICD-10-CM | POA: Insufficient documentation

## 2024-09-25 DIAGNOSIS — R1084 Generalized abdominal pain: Secondary | ICD-10-CM | POA: Insufficient documentation

## 2024-09-25 DIAGNOSIS — E876 Hypokalemia: Secondary | ICD-10-CM | POA: Diagnosis not present

## 2024-09-25 DIAGNOSIS — D72829 Elevated white blood cell count, unspecified: Secondary | ICD-10-CM | POA: Diagnosis not present

## 2024-09-25 DIAGNOSIS — N281 Cyst of kidney, acquired: Secondary | ICD-10-CM | POA: Diagnosis not present

## 2024-09-25 DIAGNOSIS — R109 Unspecified abdominal pain: Secondary | ICD-10-CM

## 2024-09-25 DIAGNOSIS — N3090 Cystitis, unspecified without hematuria: Secondary | ICD-10-CM | POA: Insufficient documentation

## 2024-09-25 DIAGNOSIS — K5900 Constipation, unspecified: Secondary | ICD-10-CM | POA: Diagnosis not present

## 2024-09-25 LAB — BASIC METABOLIC PANEL WITH GFR
Anion gap: 10 (ref 5–15)
BUN: 17 mg/dL (ref 8–23)
CO2: 31 mmol/L (ref 22–32)
Calcium: 9.3 mg/dL (ref 8.9–10.3)
Chloride: 95 mmol/L — ABNORMAL LOW (ref 98–111)
Creatinine, Ser: 0.7 mg/dL (ref 0.44–1.00)
GFR, Estimated: >60 mL/min (ref >60)
Glucose, Bld: 215 mg/dL — ABNORMAL HIGH (ref 70–99)
Potassium: 2.8 mmol/L — ABNORMAL LOW (ref 3.5–5.1)
Sodium: 136 mmol/L (ref 135–145)

## 2024-09-25 LAB — COMPREHENSIVE METABOLIC PANEL WITH GFR
ALT: 23 U/L (ref 0–44)
AST: 23 U/L (ref 15–41)
Albumin: 3.6 g/dL (ref 3.5–5.0)
Alkaline Phosphatase: 104 U/L (ref 38–126)
Anion gap: 15 (ref 5–15)
BUN: 24 mg/dL — ABNORMAL HIGH (ref 8–23)
CO2: 30 mmol/L (ref 22–32)
Calcium: 10.2 mg/dL (ref 8.9–10.3)
Chloride: 91 mmol/L — ABNORMAL LOW (ref 98–111)
Creatinine, Ser: 0.87 mg/dL (ref 0.44–1.00)
GFR, Estimated: >60 mL/min
Glucose, Bld: 265 mg/dL — ABNORMAL HIGH (ref 70–99)
Potassium: 2.8 mmol/L — ABNORMAL LOW (ref 3.5–5.1)
Sodium: 136 mmol/L (ref 135–145)
Total Bilirubin: 0.5 mg/dL (ref 0.0–1.2)
Total Protein: 6.9 g/dL (ref 6.5–8.1)

## 2024-09-25 LAB — URINALYSIS, ROUTINE W REFLEX MICROSCOPIC
Bilirubin Urine: NEGATIVE
Glucose, UA: NEGATIVE mg/dL
Hgb urine dipstick: NEGATIVE
Ketones, ur: NEGATIVE mg/dL
Nitrite: NEGATIVE
Protein, ur: 30 mg/dL — AB
Specific Gravity, Urine: 1.02 (ref 1.005–1.030)
WBC, UA: >50 WBC/hpf (ref 0–5)
pH: 5 (ref 5.0–8.0)

## 2024-09-25 LAB — CBC
HCT: 39.8 % (ref 36.0–46.0)
Hemoglobin: 13.4 g/dL (ref 12.0–15.0)
MCH: 31 pg (ref 26.0–34.0)
MCHC: 33.7 g/dL (ref 30.0–36.0)
MCV: 92.1 fL (ref 80.0–100.0)
Platelets: 237 K/uL (ref 150–400)
RBC: 4.32 MIL/uL (ref 3.87–5.11)
RDW: 12.5 % (ref 11.5–15.5)
WBC: 13.4 K/uL — ABNORMAL HIGH (ref 4.0–10.5)
nRBC: 0 % (ref 0.0–0.2)

## 2024-09-25 LAB — LIPASE, BLOOD: Lipase: 60 U/L — ABNORMAL HIGH (ref 11–51)

## 2024-09-25 MED ORDER — POTASSIUM CHLORIDE 10 MEQ/100ML IV SOLN
10.0000 meq | INTRAVENOUS | Status: AC
  Administered 2024-09-25 (×2): 10 meq via INTRAVENOUS
  Filled 2024-09-25 (×2): qty 100

## 2024-09-25 MED ORDER — ONDANSETRON HCL 4 MG/2ML IJ SOLN
4.0000 mg | Freq: Once | INTRAMUSCULAR | Status: AC
  Administered 2024-09-25: 4 mg via INTRAVENOUS
  Filled 2024-09-25: qty 2

## 2024-09-25 MED ORDER — CEPHALEXIN 500 MG PO CAPS
500.0000 mg | ORAL_CAPSULE | Freq: Once | ORAL | Status: AC
  Administered 2024-09-25: 500 mg via ORAL
  Filled 2024-09-25: qty 1

## 2024-09-25 MED ORDER — CEPHALEXIN 500 MG PO CAPS
500.0000 mg | ORAL_CAPSULE | Freq: Four times a day (QID) | ORAL | 0 refills | Status: DC
Start: 2024-09-25 — End: 2024-10-11

## 2024-09-25 MED ORDER — IOHEXOL 350 MG/ML SOLN
100.0000 mL | Freq: Once | INTRAVENOUS | Status: AC | PRN
  Administered 2024-09-25: 100 mL via INTRAVENOUS

## 2024-09-25 MED ORDER — SODIUM CHLORIDE 0.9 % IV BOLUS
1000.0000 mL | Freq: Once | INTRAVENOUS | Status: AC
  Administered 2024-09-25: 1000 mL via INTRAVENOUS

## 2024-09-25 MED ORDER — POTASSIUM CHLORIDE CRYS ER 20 MEQ PO TBCR
40.0000 meq | EXTENDED_RELEASE_TABLET | Freq: Once | ORAL | Status: AC
  Administered 2024-09-25: 40 meq via ORAL
  Filled 2024-09-25: qty 2

## 2024-09-25 MED ORDER — ACETAMINOPHEN 325 MG PO TABS
650.0000 mg | ORAL_TABLET | Freq: Once | ORAL | Status: AC
  Administered 2024-09-25: 650 mg via ORAL
  Filled 2024-09-25: qty 2

## 2024-09-25 MED ORDER — POTASSIUM CHLORIDE CRYS ER 20 MEQ PO TBCR
20.0000 meq | EXTENDED_RELEASE_TABLET | Freq: Two times a day (BID) | ORAL | 0 refills | Status: DC
Start: 2024-09-25 — End: 2024-10-14

## 2024-09-25 NOTE — Progress Notes (Signed)
   Subjective:    Patient ID: Christie [name], female    DOB: 11/14/1946, 78 y.o.   MRN: [id number]  HPI Abd pain follow up , no appetite , no energy  This patient has been having reoccurring abdominal discomfort more left flank discomfort to the upper mid flank region to mid abdomen.  She describes as a significant ache and discomfort.  This been going on for several weeks.  She had a CAT scan back toward the end of July which did not show any tumor or growth.  She was seen by gastroenterology who did a upper endoscopy in early September with negative biopsies.  Over the past few weeks increasing pain.  We had got the ball in motion to have a CTA of the mesenteric arteries but due to scheduling with central scheduling apparently the earliest that could get done was early November.  She was previously on Rybelsus  and we stopped the Rybelsus .  She was having some constipation probably from the Rybelsus  but she recently had bowel movements on Sunday.  Since Sunday she has not been eating or drinking well because of abdominal pain.  Over the past few days she has had increasing pain discomfort mainly in the mid abdomen aching discomfort not feeling good.  She denies high fever chills sweats.  She states her urine is concentrated.  She feels very fatigued and has very low energy.  Last week she did have additional blood work which showed low potassium she was supplemented with potassium.  She is continuing her regular medicines.  Our plan was to recheck the potassium in 10 to 14 days  Review of Systems     Objective:   Physical Exam General-in no acute distress Eyes-no discharge Lungs-respiratory rate normal, CTA CV-no murmurs,RRR Extremities skin warm dry no edema Neuro grossly normal Behavior normal, alert Abdomen is soft with subjective mid abdomen tenderness Blood pressure 114/69 when she stands 100/40 This is low for this patient  Our goal previously was to do the CTA abdominal scan please  see documentation above regarding issues with getting that scheduled promptly     Assessment & Plan:  Abdominal pain Mild orthostasis, concern for hypovolemia dehydration Concentrated urine History of hypokalemia  Patient deterioration of her clinical status.  She does not look like she feels well.  Significant abdominal pain.  Also concern for hypovolemia dehydration.  She would benefit from stat labs along with IV fluids.  In addition to this she will more than likely need to have stat CT abdomen pelvis but if possible to have CTA to look to see if she has mesenteric ischemia going on.  We will discuss the case with ER doctor.  Family and patient is on board with going to the ER.

## 2024-09-25 NOTE — Telephone Encounter (Signed)
 Patient seen today evaluated to ER doctor spoke with they are seeing her currently

## 2024-09-25 NOTE — ED Triage Notes (Signed)
 Pt reports abdominal pain with constipation issues for several months, sent from PCP office.

## 2024-09-25 NOTE — Discharge Instructions (Signed)
 Your potassium today was low.  You have been given IV and oral potassium here along with a prescription for potassium to take twice a day for the next 3 to 4 days.  Also, stop your hydrochlorothiazide  for your blood pressure.  This medication is a diuretic and reports potassium from your blood cells.  Please call Dr. Steven office tomorrow to arrange a follow-up appointment to have your potassium level rechecked as well as your urine.  Please return to the emergency department if you develop any new or worsening symptoms.

## 2024-09-25 NOTE — ED Provider Notes (Signed)
 [place] EMERGENCY DEPARTMENT AT [facility] Provider Note   CSN: [id number] Arrival date & time: 09/25/24  1228     Patient presents with: Abdominal Pain   Christie [name] is a 78 y.o. female.    Abdominal Pain Associated symptoms: constipation and nausea   Associated symptoms: no chest pain, no chills, no dysuria, no fever and no shortness of breath        Christie [name] is a 78 y.o. female past medical history of hypertension, kidney stones, aortic stenosis, type 2 diabetes, IBS, who presents to the Emergency Department complaining of 56-month history of waxing and waning abdominal pain.  She was seen earlier today by her PCP and recommended to come to the ER for further evaluation.  She describes diffuse mid abdominal pain that is intermittent, no alleviating or aggravating factors.  She does also endorse having decreased appetite and intermittent nausea.  No vomiting fever or chills.  She denies any dysuria symptoms.  Family member also at bedside endorses 15 to 20 pound weight loss since onset of her abdominal pain.  Also has concern for possible dehydration as she has had limited fluid intake as well.  Recent constipation relieved after taking MiraLAX , but states her stools were beige in color and looked like sand.  Denies any black or bloody stools recently.  Denies any chest pain or shortness of breath.  Surgical history includes prior cholecystectomy and complete hysterectomy.  She had colonoscopy 3 years ago and upper endoscopy in early September of this year  Prior to Admission medications   Medication Sig Start Date End Date Taking? Authorizing Provider  acetaminophen  (TYLENOL ) 500 MG tablet Take 1,000 mg by mouth every 6 (six) hours as needed (pain).    [provider]  alendronate  (FOSAMAX ) 70 MG tablet Take 1 tablet (70 mg total) by mouth once a week. Take with a full glass of water  on an empty stomach. Patient not taking: Reported on 09/19/2024 04/04/24    Alphonsa [name], MD  ALPRAZolam  (XANAX ) 0.5 MG tablet TAKE 1 TABLET BY MOUTH AT BEDTIME. 05/28/24   Alphonsa [name], MD  amLODipine  (NORVASC ) 10 MG tablet Take 1 tablet (10 mg total) by mouth daily. 08/05/24   Alphonsa [name], MD  Ascorbic Acid (VITAMIN C) 1000 MG tablet Take 1,000 mg by mouth daily.    [provider]  cholecalciferol (VITAMIN D ) 1000 UNITS tablet Take 1,000 Units by mouth daily.    [provider]  docusate sodium  (COLACE) 100 MG capsule Take 300 mg by mouth at bedtime.    [provider]  Elderberry-Vitamin C-Zinc (ELDERBERRY EXTRACT PO) Take by mouth. One tablespoon per day.    [provider]  fluticasone  (FLONASE ) 50 MCG/ACT nasal spray Place 2 sprays into both nostrils daily. 12/14/23 09/19/24  Karis Clunes, MD  glipiZIDE  (GLUCOTROL ) 5 MG tablet 2 tablets in the morning and 1 and 1/2 in the evening as directed to a maximum of 2 tablets in the morning and 2 tablets in the evening as directed 06/01/24   Alphonsa [name], MD  glucose blood (ACCU-CHEK GUIDE TEST) test strip Use to check blood sugar once daily as directed. 06/17/24   Alphonsa [name], MD  hydrochlorothiazide  (MICROZIDE ) 12.5 MG capsule Take 1 capsule (12.5 mg total) by mouth daily. 09/19/24   Alphonsa [name], MD  hyoscyamine  (LEVSIN  SL) 0.125 MG SL tablet Place 1 tablet (0.125 mg total) under the tongue every 6 (six) hours as needed (  abdominal pain). 08/08/24   Eartha Angelia Sieving, MD  Multiple Vitamins-Minerals (MACULAR VITAMIN BENEFIT PO) Take 1 tablet by mouth in the morning and at bedtime.    [provider]  omeprazole  (PRILOSEC) 40 MG capsule Take 1 capsule (40 mg total) by mouth daily. Patient not taking: Reported on 09/19/2024 08/22/24   Eartha Angelia Sieving, MD  ondansetron  (ZOFRAN ) 4 MG tablet Take 1 tablet (4 mg total) by mouth every 8 (eight) hours as needed for nausea or vomiting. 08/28/24   Eartha Angelia Sieving, MD  OVER THE COUNTER MEDICATION IB Guard BID     [provider]  potassium chloride  (KLOR-CON  M) 10 MEQ tablet 1 daily for 7 days 09/20/24   Alphonsa [name], MD  rosuvastatin  (CRESTOR ) 40 MG tablet Take 1 tablet (40 mg total) by mouth daily. 08/05/24   Alphonsa [name], MD  sertraline  (ZOLOFT ) 50 MG tablet TAKE (1) TABLET BY MOUTH ONCE DAILY. 05/09/24   Alphonsa [name], MD  valsartan  (DIOVAN ) 160 MG tablet TAKE ONE TABLET BY MOUTH TWICE DAILY 09/02/24   Alphonsa [name], MD  zinc gluconate 50 MG tablet Take 50 mg by mouth daily.    [provider]    Allergies: Atorvastatin, Metformin and related, Oxycodone-acetaminophen , Ace inhibitors, and Gabapentin     Review of Systems  Constitutional:  Negative for chills and fever.  Respiratory:  Negative for shortness of breath.   Cardiovascular:  Negative for chest pain.  Gastrointestinal:  Positive for abdominal pain, constipation and nausea. Negative for blood in stool.  Genitourinary:  Negative for difficulty urinating, dysuria and flank pain.  Musculoskeletal:  Negative for arthralgias and myalgias.  Neurological:  Positive for weakness (generalized weakness). Negative for dizziness and headaches.  Psychiatric/Behavioral:  Negative for confusion.     Updated Vital Signs BP (!) 131/58   Pulse 74   Temp 98.5 F (36.9 C) (Oral)   Resp 16   Wt 68 kg   SpO2 97%   BMI 26.15 kg/m   Physical Exam Vitals and nursing note reviewed.  Constitutional:      General: She is not in acute distress.    Appearance: Normal appearance. She is not toxic-appearing.  HENT:     Mouth/Throat:     Mouth: Mucous membranes are dry.  Eyes:     Extraocular Movements: Extraocular movements intact.     Conjunctiva/sclera: Conjunctivae normal.     Pupils: Pupils are equal, round, and reactive to light.  Cardiovascular:     Rate and Rhythm: Normal rate and regular rhythm.     Pulses: Normal pulses.  Pulmonary:     Effort: Pulmonary effort is normal.     Breath sounds: Normal breath sounds.   Abdominal:     General: There is no distension.     Palpations: Abdomen is soft. There is no mass.     Tenderness: There is no abdominal tenderness. There is no guarding.     Comments: Abdomen is soft and no localizing tenderness on my exam  Musculoskeletal:     Right lower leg: No edema.     Left lower leg: No edema.  Skin:    General: Skin is warm.     Capillary Refill: Capillary refill takes less than 2 seconds.  Neurological:     General: No focal deficit present.     Mental Status: She is alert.     Sensory: No sensory deficit.     Motor: No weakness.     (all labs  ordered are listed, but only abnormal results are displayed) Labs Reviewed  LIPASE, BLOOD - Abnormal; Notable for the following components:      Result Value   Lipase 60 (*)    All other components within normal limits  COMPREHENSIVE METABOLIC PANEL WITH GFR - Abnormal; Notable for the following components:   Potassium 2.8 (*)    Chloride 91 (*)    Glucose, Bld 265 (*)    BUN 24 (*)    All other components within normal limits  CBC - Abnormal; Notable for the following components:   WBC 13.4 (*)    All other components within normal limits  URINALYSIS, ROUTINE W REFLEX MICROSCOPIC - Abnormal; Notable for the following components:   APPearance HAZY (*)    Protein, ur 30 (*)    Leukocytes,Ua MODERATE (*)    Bacteria, UA RARE (*)    All other components within normal limits  BASIC METABOLIC PANEL WITH GFR - Abnormal; Notable for the following components:   Potassium 2.8 (*)    Chloride 95 (*)    Glucose, Bld 215 (*)    All other components within normal limits  URINE CULTURE    EKG: EKG Interpretation Date/Time:  Wednesday September 25 2024 15:28:33 EDT Ventricular Rate:  84 PR Interval:  166 QRS Duration:  144 QT Interval:  453 QTC Calculation: 536 R Axis:   61  Text Interpretation: Sinus rhythm Right bundle branch block Confirmed by Cottie Cough [phone]) on 09/25/2024 3:46:36  PM  Radiology: CT Angio Abd/Pel W and/or Wo Contrast Result Date: 09/25/2024 CLINICAL DATA:  Abdominal pain and constipation for several months. EXAM: CTA ABDOMEN AND PELVIS WITHOUT AND WITH CONTRAST TECHNIQUE: Multidetector CT imaging of the abdomen and pelvis was performed using the standard protocol during bolus administration of intravenous contrast. Multiplanar reconstructed images and MIPs were obtained and reviewed to evaluate the vascular anatomy. RADIATION DOSE REDUCTION: This exam was performed according to the departmental dose-optimization program which includes automated exposure control, adjustment of the mA and/or kV according to patient size and/or use of iterative reconstruction technique. CONTRAST:  OMNIPAQUE  IOHEXOL  350 MG/ML SOLN COMPARISON:  July 10, 2024. FINDINGS: VASCULAR Aorta: Atherosclerosis of abdominal aorta is noted without aneurysm or dissection. Celiac: Mild narrowing is noted at origin of celiac artery without thrombus. SMA: Patent without evidence of aneurysm, dissection, vasculitis or significant stenosis. Renals: Both renal arteries are patent without evidence of aneurysm, dissection, vasculitis, fibromuscular dysplasia or significant stenosis. IMA: Patent without evidence of aneurysm, dissection, vasculitis or significant stenosis. Inflow: Patent without evidence of aneurysm, dissection, vasculitis or significant stenosis. Proximal Outflow: Bilateral common femoral and visualized portions of the superficial and profunda femoral arteries are patent without evidence of aneurysm, dissection, vasculitis or significant stenosis. Veins: No obvious venous abnormality within the limitations of this arterial phase study. Review of the MIP images confirms the above findings. NON-VASCULAR Lower chest: No acute abnormality. Hepatobiliary: No focal liver abnormality is seen. Status post cholecystectomy. No biliary dilatation. Pancreas: Unremarkable. No pancreatic ductal dilatation  or surrounding inflammatory changes. Spleen: Normal in size without focal abnormality. Adrenals/Urinary Tract: Adrenal glands appear normal. Nonobstructive left nephrolithiasis is noted. Left renal cyst is noted. No hydronephrosis or renal obstruction is noted. Gas is noted in non dependent portion of urinary bladder suggesting recent instrumentation or possibly urinary tract infection. Mild left ureteral wall thickening and possible enhancement is noted suggesting ascending urinary tract infection. Stomach/Bowel: Stomach is within normal limits. Appendix appears normal. No evidence of bowel  wall thickening, distention, or inflammatory changes. Lymphatic: No significant vascular findings are present. No enlarged abdominal or pelvic lymph nodes. Reproductive: Status post hysterectomy. No adnexal masses. Other: No ascites or hernia was noted. Musculoskeletal: Status post surgical posterior fusion of L2-3, L4-5 and L5-S1. IMPRESSION: VASCULAR Atherosclerosis of abdominal aorta is noted without aneurysm or dissection. Mild narrowing is noted at origin of celiac artery without thrombus. NON-VASCULAR Nonobstructive left nephrolithiasis. 1. Gas is noted in non dependent portion of urinary bladder suggesting recent instrumentation or possibly urinary tract infection. Mild left ureteral wall thickening and possible enhancement is noted suggesting ascending urinary tract infection. 2. Status post cholecystectomy and hysterectomy. 3. Status post surgical posterior fusion of L2-3, L4-5 and L5-S1. Aortic Atherosclerosis (ICD10-I70.0). Electronically Signed   By: Lynwood Landy Raddle M.D.   On: 09/25/2024 15:55   US  ABDOMEN LIMITED RUQ (LIVER/GB) Result Date: 09/20/2024 CLINICAL DATA:  Abdominal discomfort. EXAM: ULTRASOUND ABDOMEN LIMITED RIGHT UPPER QUADRANT COMPARISON:  Ultrasound exam 05/07/2015. CT scan 07/10/2024 FINDINGS: Gallbladder: Surgically absent. Common bile duct: Diameter: 10 mm diameter similar to 9 mm diameter on  the prior ultrasound study from 05/07/2015. Liver: No focal lesion identified. Increased parenchymal echogenicity. Portal vein is patent on color Doppler imaging with normal direction of blood flow towards the liver. Other: Visualized portion of the pancreas unremarkable. IMPRESSION: 1. Status post cholecystectomy. 2. Common bile duct measures 10 mm diameter, similar to 9 mm diameter on the prior ultrasound study from 05/07/2015. 3. Increased hepatic parenchymal echogenicity suggests fatty deposition. 4. No pancreatic abnormality evident although CT imaging would be a more sensitive means to evaluate pancreatic parenchymal anatomy. Electronically Signed   By: Camellia Candle M.D.   On: 09/20/2024 07:47      Procedures   Medications Ordered in the ED  sodium chloride  0.9 % bolus 1,000 mL (has no administration in time range)  ondansetron  (ZOFRAN ) injection 4 mg (has no administration in time range)    Clinical Course as of 09/25/24 1909  Wed Sep 25, 2024  1640 This is an 78 year old female who presents to the ED in the company of her daughter with concern for chronic lower abdominal pain, poor appetite, weight loss.  Her daughter reports that they have been working this up extensively as an outpatient with her PCP and GI doctor.  They referred in for CT angio to be done urgently because of complaint of abdominal pain for possible bowel ischemia evaluation.  However, the patient is quite clear to me that she does not have worsening pain with eating.  She simply does not want to eat because she feels like food gets stuck and has a hard time going down.  They have seen GI for this and had an endoscopy and have a follow-up appointment with GI.  She is well-appearing on exam.  Her blood work is notable for a mild leukocytosis and some also mild hypokalemia potassium 2.8.  CT angiogram is unremarkable, no emergent findings.  EKG is reassuring without acute ischemia.  Patient is having potassium repletion.  The  likely culprit for hypokalemia is her initiation of HCTZ earlier this week for high blood pressure.  We will discontinue this medication and likely make a minor adjustment to her other chronic medicine, Diovan .  He is also on amlodipine  daily 10 mg, maximum dose for high blood pressure.  She will need to follow-up with her PCP for the blood pressure issue. [MT]  1641 We are pending a UA as the patient does have some  bladder wall thickening on the CT imaging.  However she does not have any dysuria or history of UTIs.  I am anticipating that she will be discharged after completion of her workup here in the ED. [MT]  1703 UA suggestive of possible infection [MT]    Clinical Course User Index [MT] Trifan, Donnice PARAS, MD                                 Medical Decision Making Patient sent here from PCP recommendation for 42-month history of abdominal pain, weight loss and decreased appetite.  Intermittent nausea and patient states pain is mid abdomen and intermittent.  No aggravating or alleviating factors.  She denies any melena or hematochezia.  No fever or chills dysuria or vomiting.  Recent constipation alleviated after using over-the-counter MiraLAX  patient reports bowel movement appeared to look like sand.  On further history, she also notes recent hypokalemia was started on 10 mEq of potassium daily 3 days ago by PCP.  PCP requesting CTA abdomen and pelvis for possible mesenteric ischemia which would certainly be included in differential, SBO, neoplasm also considered.  Amount and/or Complexity of Data Reviewed Labs: ordered.    Details: No significant leukocytosis, chemistry show hypokalemia with potassium of 2.8, it was 3 six days ago.  LFTs are reassuring  Patient had urinalysis with culture from 7 days ago that has preliminary report of Pseudomonas florescens with greater than 100,000 colonies  Radiology: ordered.    Details: CTA abdomen pelvis shows atherosclerosis of the abdominal  aorta without aneurysm or dissection.  Nonvascular imaging shows possible urinary tract infection.  Patient had recent gallbladder ultrasound and results were reviewed by me Discussion of management or test interpretation with external provider(s): Patient with significant hypokalemia likely secondary to her diuretic use for hypertension given IV and oral potassium here.  There was no significant improvement of her potassium level, this is likely due to her also receiving IV fluids as there were some concern for possible dehydration given her loss of appetite and limited food intake.  Patient resting comfortably on recheck, she prefers to go home and follow-up closely outpatient with her PCP.  I feel this is reasonable.  Will increase her oral potassium for the next several days and have her discontinue her hydrochlorothiazide .  She will follow-up closely outpatient with her PCP to have her potassium level rechecked in a few days.  She also has close GI follow-up.  I have cultured her urine and we will start her on cephalexin  for UTI  Risk OTC drugs. Prescription drug management.        Final diagnoses:  Generalized abdominal pain  Hypokalemia  Cystitis    ED Discharge Orders          Ordered    potassium chloride  SA (KLOR-CON  M) 20 MEQ tablet  2 times daily        09/25/24 1914    cephALEXin  (KEFLEX ) 500 MG capsule  4 times daily        09/25/24 1914               Christie Milling, Christie [name] 09/27/24 2140    Cottie Donnice PARAS, MD 09/30/24 [phone]

## 2024-09-26 ENCOUNTER — Telehealth: Payer: Self-pay | Admitting: Family Medicine

## 2024-09-26 ENCOUNTER — Encounter: Payer: Self-pay | Admitting: Family Medicine

## 2024-09-26 ENCOUNTER — Other Ambulatory Visit: Payer: Self-pay

## 2024-09-26 DIAGNOSIS — R109 Unspecified abdominal pain: Secondary | ICD-10-CM

## 2024-09-26 DIAGNOSIS — E876 Hypokalemia: Secondary | ICD-10-CM

## 2024-09-26 NOTE — Telephone Encounter (Signed)
 Thanks

## 2024-09-26 NOTE — Telephone Encounter (Signed)
 Nurses Patient was in the ER yesterday and had low potassium with abdominal pain Needs a follow-up with myself next week please place in the same-day slot Please also have the patient do a metabolic 7 and a magnesium on Monday through [facility] to recheck her potassium Let the patient know that we are connecting with Dr. Eartha as well Also make sure that she gets her appointment set up for a follow-up next week thank you-Dr. Glendia Fielding

## 2024-09-26 NOTE — Telephone Encounter (Signed)
 Patient recently in the ER with abdominal pain Has ongoing abdominal pain Please see ER note and previous office visit notes  Hi Christie [name]-if your staff could work her in with either yourself or one of your PAs somewhere in the next few weeks that would be very helpful.  She has had CT scan, as you are aware EGD, she even had a CTA which showed mild celiac buildup-not sure if that is clinically significant  She may or may not need small bowel camera capsule We have worked with her extensively and not making headway on her abdominal pain I appreciate your team's input-thanks-Christie [name]

## 2024-09-26 NOTE — Telephone Encounter (Signed)
 Nurses Please see previous phone message I would like for the patient to have a follow-up with me preferably next week if possible also she needs to do a metabolic 7 on Monday through [facility]

## 2024-09-26 NOTE — Telephone Encounter (Signed)
 Thanks [place], we will attempt to get her on our schedule soon.  [place], can you please schedule a follow up appointment for this patient in next available with me or any of the APPs?  Thanks,  Toribio Fortune, MD Gastroenterology and Hepatology [facility] Gastroenterology

## 2024-09-27 LAB — URINE CULTURE: Culture: >=100000 — AB

## 2024-09-27 NOTE — Telephone Encounter (Signed)
Duplicate- see other message

## 2024-09-27 NOTE — Telephone Encounter (Signed)
 Blood work ordered in [employer]. Patient scheduled 10/02/24 at 10:40am with Dr Glendia- GI called and was going to work her in yesterday but she declined and they scheduled her for 10/16/24

## 2024-09-28 ENCOUNTER — Telehealth (HOSPITAL_BASED_OUTPATIENT_CLINIC_OR_DEPARTMENT_OTHER): Payer: Self-pay | Admitting: *Deleted

## 2024-09-28 ENCOUNTER — Other Ambulatory Visit (INDEPENDENT_AMBULATORY_CARE_PROVIDER_SITE_OTHER): Payer: Self-pay | Admitting: Gastroenterology

## 2024-09-28 DIAGNOSIS — R110 Nausea: Secondary | ICD-10-CM

## 2024-09-28 NOTE — Telephone Encounter (Signed)
 Post ED Visit - Positive Culture Follow-up  Culture report reviewed by antimicrobial stewardship pharmacist: [facility] []  Rankin Dee, Pharm.D. []  Venetia Gully, Pharm.D., BCPS AQ-ID []  Garrel Crews, Pharm.D., BCPS []  Almarie Lunger, Pharm.D., BCPS []  [place], [address].D., BCPS, AAHIVP []  Rosaline Bihari, Pharm.D., BCPS, AAHIVP []  Vernell Meier, PharmD, BCPS []  Latanya Hint, PharmD, BCPS []  Donald Medley, PharmD, BCPS []  Rocky Bold, PharmD []  Dorothyann Alert, PharmD, BCPS [x]  Dorn Poot, PharmD  [facility] []  Rosaline Edison, PharmD []  Romona Bliss, PharmD []  Dolphus Roller, PharmD []  Veva Seip, Rph []  Vernell Daunt) Leonce, PharmD []  Eva Allis, PharmD []  Rosaline Millet, PharmD []  Iantha Batch, PharmD []  Arvin Gauss, PharmD []  Wanda Hasting, PharmD []  Ronal Rav, PharmD []  Rocky Slade, PharmD []  Bard Jeans, PharmD   Positive urine culture Treated with Cephalexin , organism sensitive to the same and no further patient follow-up is required at this time.  Christie [name] 09/28/2024, 11:36 AM

## 2024-09-30 DIAGNOSIS — E876 Hypokalemia: Secondary | ICD-10-CM | POA: Diagnosis not present

## 2024-10-01 ENCOUNTER — Ambulatory Visit: Payer: Self-pay | Admitting: Family Medicine

## 2024-10-01 LAB — BASIC METABOLIC PANEL WITH GFR
BUN/Creatinine Ratio: 13 (ref 12–28)
BUN: 10 mg/dL (ref 8–27)
CO2: 21 mmol/L (ref 20–29)
Calcium: 9.6 mg/dL (ref 8.7–10.3)
Chloride: 98 mmol/L (ref 96–106)
Creatinine, Ser: 0.76 mg/dL (ref 0.57–1.00)
Glucose: 231 mg/dL — ABNORMAL HIGH (ref 70–99)
Potassium: 3.6 mmol/L (ref 3.5–5.2)
Sodium: 137 mmol/L (ref 134–144)
eGFR: 79 mL/min/1.73 (ref >59)

## 2024-10-01 LAB — MAGNESIUM: Magnesium: 2 mg/dL (ref 1.6–2.3)

## 2024-10-02 ENCOUNTER — Encounter: Payer: Self-pay | Admitting: Family Medicine

## 2024-10-02 ENCOUNTER — Encounter (INDEPENDENT_AMBULATORY_CARE_PROVIDER_SITE_OTHER): Payer: Self-pay | Admitting: Gastroenterology

## 2024-10-02 ENCOUNTER — Ambulatory Visit: Payer: Self-pay | Admitting: Family Medicine

## 2024-10-02 VITALS — BP 128/72 | HR 89 | Temp 97.2°F | Ht 63.0 in | Wt 141.5 lb

## 2024-10-02 DIAGNOSIS — E876 Hypokalemia: Secondary | ICD-10-CM

## 2024-10-02 DIAGNOSIS — E119 Type 2 diabetes mellitus without complications: Secondary | ICD-10-CM

## 2024-10-02 DIAGNOSIS — R109 Unspecified abdominal pain: Secondary | ICD-10-CM | POA: Diagnosis not present

## 2024-10-02 DIAGNOSIS — R634 Abnormal weight loss: Secondary | ICD-10-CM

## 2024-10-02 NOTE — Progress Notes (Signed)
   Subjective:    Patient ID: Christie [name], female    DOB: 1946-08-28, 78 y.o.   MRN: [id number]  HPI  Pt. Got blood work done Monday to check Potassium  Has been on antibiotic - for UTI 4x a day. Stomach hurts some but will take Tylenol . Lab work shows potassium in good range Patient off of HCTZ Blood pressure good control today Her abdominal pain is still intermittent multiple times per day having to take Tylenol  for it it is mainly lower abdominal pain she relates no desire to eat has been losing weight Having a lot of fatigue tiredness due to the weight loss She had a CT scan done earlier this year Had recent CTA which shows mild mesenteric artery atherosclerosis Back in December her weight was 169 now its currently 141.  Review of Systems     Objective:   Physical Exam General-in no acute distress Eyes-no discharge Lungs-respiratory rate normal, CTA CV-no murmurs,RRR Extremities skin warm dry no edema Neuro grossly normal Behavior normal, alert Subjective discomfort in the lower abdomen region       Assessment & Plan:  Ongoing abdominal pain This abdominal pain has led to poor p.o. intake and has also led to weight loss We will do lab work before her visit with gastroenterology  Remote history of vulvar cancer in 1999 patient has had previous hysterectomy-recent scans did not show any signs of obvious tumor growth in the pelvic region CAT scan back in summer time looked good She had a CTA of the intestinal arteries just recently showed some mild buildup of the celiac artery Will be seeing gastroenterology in the near future May need to have further testing Patient to follow-up 3 to 6 weeks this will depend on what comes from for gastroenterology consult She is to do lab work next week She had a colonoscopy back in 2022  I am concerned about patient's weight loss of encouraged her to try to eat small meals frequently.  We will also have her see gastroenterology-not  sure if she may need a repeat colonoscopy or possibly pill camera to look through the intestines-uncertain if there could be other pathology going on causing all of this. We will see what gastroenterology has to say

## 2024-10-06 ENCOUNTER — Encounter: Payer: Self-pay | Admitting: Family Medicine

## 2024-10-07 ENCOUNTER — Encounter (INDEPENDENT_AMBULATORY_CARE_PROVIDER_SITE_OTHER): Payer: Medicare PPO | Admitting: Ophthalmology

## 2024-10-10 ENCOUNTER — Other Ambulatory Visit: Payer: Self-pay | Admitting: Family Medicine

## 2024-10-10 DIAGNOSIS — R109 Unspecified abdominal pain: Secondary | ICD-10-CM | POA: Diagnosis not present

## 2024-10-10 DIAGNOSIS — E876 Hypokalemia: Secondary | ICD-10-CM | POA: Diagnosis not present

## 2024-10-10 MED ORDER — SITAGLIPTIN PHOSPHATE 50 MG PO TABS: 50.0000 mg | ORAL_TABLET | Freq: Every day | ORAL | 5 refills | Status: DC

## 2024-10-11 ENCOUNTER — Ambulatory Visit (INDEPENDENT_AMBULATORY_CARE_PROVIDER_SITE_OTHER): Admitting: Gastroenterology

## 2024-10-11 ENCOUNTER — Encounter (INDEPENDENT_AMBULATORY_CARE_PROVIDER_SITE_OTHER): Payer: Self-pay | Admitting: Gastroenterology

## 2024-10-11 VITALS — BP 114/73 | HR 92 | Temp 97.8°F | Ht 63.0 in | Wt 149.8 lb

## 2024-10-11 DIAGNOSIS — K580 Irritable bowel syndrome with diarrhea: Secondary | ICD-10-CM | POA: Diagnosis not present

## 2024-10-11 DIAGNOSIS — R634 Abnormal weight loss: Secondary | ICD-10-CM

## 2024-10-11 DIAGNOSIS — R1084 Generalized abdominal pain: Secondary | ICD-10-CM

## 2024-10-11 LAB — BASIC METABOLIC PANEL WITH GFR
BUN/Creatinine Ratio: 16 (ref 12–28)
BUN: 9 mg/dL (ref 8–27)
CO2: 24 mmol/L (ref 20–29)
Calcium: 10.2 mg/dL (ref 8.7–10.3)
Chloride: 99 mmol/L (ref 96–106)
Creatinine, Ser: 0.55 mg/dL — ABNORMAL LOW (ref 0.57–1.00)
Glucose: 218 mg/dL — ABNORMAL HIGH (ref 70–99)
Potassium: 3.3 mmol/L — ABNORMAL LOW (ref 3.5–5.2)
Sodium: 141 mmol/L (ref 134–144)
eGFR: 93 mL/min/1.73 (ref >59)

## 2024-10-11 LAB — CBC WITH DIFFERENTIAL/PLATELET
Basophils Absolute: 0 x10E3/uL (ref 0.0–0.2)
Basos: 0 %
EOS (ABSOLUTE): 0 x10E3/uL (ref 0.0–0.4)
Eos: 0 %
Hematocrit: 43.3 % (ref 34.0–46.6)
Hemoglobin: 14.1 g/dL (ref 11.1–15.9)
Immature Grans (Abs): 0 x10E3/uL (ref 0.0–0.1)
Immature Granulocytes: 0 %
Lymphocytes Absolute: 1.9 x10E3/uL (ref 0.7–3.1)
Lymphs: 19 %
MCH: 31.5 pg (ref 26.6–33.0)
MCHC: 32.6 g/dL (ref 31.5–35.7)
MCV: 97 fL (ref 79–97)
Monocytes Absolute: 0.7 x10E3/uL (ref 0.1–0.9)
Monocytes: 7 %
Neutrophils Absolute: 7.6 x10E3/uL — ABNORMAL HIGH (ref 1.4–7.0)
Neutrophils: 74 %
Platelets: 289 x10E3/uL (ref 150–450)
RBC: 4.47 x10E6/uL (ref 3.77–5.28)
RDW: 13.3 % (ref 11.7–15.4)
WBC: 10.2 x10E3/uL (ref 3.4–10.8)

## 2024-10-11 LAB — HEPATIC FUNCTION PANEL
ALT: 13 IU/L (ref 0–32)
AST: 15 IU/L (ref 0–40)
Albumin: 4.4 g/dL (ref 3.8–4.8)
Alkaline Phosphatase: 65 IU/L (ref 49–135)
Bilirubin Total: 0.7 mg/dL (ref 0.0–1.2)
Bilirubin, Direct: 0.27 mg/dL (ref 0.00–0.40)
Total Protein: 6.8 g/dL (ref 6.0–8.5)

## 2024-10-11 LAB — MAGNESIUM: Magnesium: 1.8 mg/dL (ref 1.6–2.3)

## 2024-10-11 LAB — LIPASE: Lipase: 48 U/L (ref 14–85)

## 2024-10-11 MED ORDER — DICYCLOMINE HCL 10 MG PO CAPS: 10.0000 mg | ORAL_CAPSULE | Freq: Two times a day (BID) | ORAL | 1 refills | Status: DC | PRN

## 2024-10-11 NOTE — Progress Notes (Signed)
 Toribio Fortune, M.D. Gastroenterology & Hepatology [facility] Gastroenterology [address] [place], [place] [address]  Primary Care Physician: Alphonsa [name], MD [address] [place] [address]  I will communicate my assessment and recommendations to the referring MD via EMR.  Problems: Chronic abdominal pain IBS-D  History of Present Illness: Christie [name] is a 78 y.o. female with PMH vulvar cancer, depression, diabetes, HTN, HLD and IBS-D who presents for follow-up of abdominal discomfort .   The patient was last seen on 08/08/2024. At that time, the patient was started on Levsin  as needed for abdominal pain and scheduled for EGD with findings described below.  Was also advised to continue IBgard as needed.  Unfortunately, the patient had persistence of abdominal pain and had very poor oral intake due to the abdominal pain.  Due to this she went to the ER on 09/25/2024. CT angio abdomen and pelvis with IV contrast showed passing bladder suggestive of possible instrumentation or infection but no other acute abnormalities in the abdomen.  No vascular abnormalities that were remarkable, besides some mild narrowing of the celiac artery.  Patient reports that she has had persistent abdominal pain diffusely. States she was not eating too much and lost close to 28 lb. Stats that the last few days she has had slightly less pain and she gained back some weight. No bloating. Has felt some nausea but not affecting directly her food intake. Patient denies having any fever, chills, hematochezia, melena, hematemesis, abdominal distention, jaundice, pruritus . She tried Levsin  without improvement.  Had a recent bout of diarrhea for a week, which resolved on its own.  She stopped Rybelsus  after her last clinic visit. Then restarted it, however for the last 2 weeks she has stopped it. Was switched to sitagliptin by her PCP yesterday but has not started yet.  Had  blood workup yesterday which showed potassium of 3.3, CMP otherwise unremarkable, CBC was normal.  Last EGD: 08/22/2024 - Normal esophagus. - Congestive gastropathy. Biopsied. - Normal examined duodenum. Biopsied.  Last Colonoscopy: 02/10/2021 1 small polyp in the ascending colon which was a tubular adenoma. Random colonic biopsies were negative for microscopic colitis.   Past Medical History: Past Medical History:  Diagnosis Date   Cancer (HCC) 1999   external vulva lesion   Depression    DM (diabetes mellitus) (HCC) 2012   oral medications   Elevated liver enzymes    Hearing deficit    Heart murmur    History of kidney stones    Hyperlipidemia    Hyperlipidemia associated with type 2 diabetes mellitus (HCC) 02/25/2020   Hypertension 2000   Spastic colon     Past Surgical History: Past Surgical History:  Procedure Laterality Date   ANTERIOR LAT LUMBAR FUSION Right 07/04/2017   Procedure: Right LUMBAR TWO-THREE LUMBAR THREE-FOUR LUMBAR FOUR-FIVE Anterior lateral lumbar interbody fusion with percutaneous pedicle screws;  Surgeon: Unice Pac, MD;  Location: [facility] OR;  Service: Neurosurgery;  Laterality: Right;  Right lateral approach    ANTERIOR LATERAL LUMBAR FUSION WITH PERCUTANEOUS SCREW 3 LEVEL N/A 07/04/2017   Procedure: PERCUTANEOUS SCREW THREE LEVEL LUMBAR TWO-THREE LUMBAR THREE-FOUR LUMBAR FOUR-FIVE;  Surgeon: Unice Pac, MD;  Location: [facility] OR;  Service: Neurosurgery;  Laterality: N/A;  Prone   BACK SURGERY  2004 and 2017   BIOPSY  02/23/2021   Procedure: BIOPSY;  Surgeon: Fortune Angelia Toribio, MD;  Location: [facility];  Service: Gastroenterology;;   CHOLECYSTECTOMY  COLONOSCOPY  01/2004   COLONOSCOPY N/A 02/13/2014   Procedure: COLONOSCOPY;  Surgeon: Claudis RAYMOND Rivet, MD;  Location: [facility];  Service: Endoscopy;  Laterality: N/A;  930-moved to 100 Ann notified pt   COLONOSCOPY WITH PROPOFOL  N/A 02/23/2021   Procedure: COLONOSCOPY WITH PROPOFOL ;  Surgeon:  Eartha Angelia Sieving, MD;  Location: [facility];  Service: Gastroenterology;  Laterality: N/A;  AM   ESOPHAGOGASTRODUODENOSCOPY  09/2005   ESOPHAGOGASTRODUODENOSCOPY N/A 08/22/2024   Procedure: EGD (ESOPHAGOGASTRODUODENOSCOPY);  Surgeon: Eartha Angelia, Sieving, MD;  Location: [facility];  Service: Gastroenterology;  Laterality: N/A;  8:00 am, asa 1-2   EYE SURGERY Right    catatract removed   EYE SURGERY Right    macular   surgery    KIDNEY STONE SURGERY     for a staghorn kidney stone   POLYPECTOMY  02/23/2021   Procedure: POLYPECTOMY;  Surgeon: Eartha Angelia Sieving, MD;  Location: [facility];  Service: Gastroenterology;;   STAPEDECTOMY Bilateral    Right 1991 and Left 1994   TOTAL ABDOMINAL HYSTERECTOMY  1982   vulva cancer surgery  1999    Family History: Family History  Problem Relation Age of Onset   Diabetes Father    Hypertension Father    Dementia Father    Pancreatic cancer Mother     Social History: Social History   Tobacco Use  Smoking Status Never  Smokeless Tobacco Never   Social History   Substance and Sexual Activity  Alcohol Use No   Alcohol/week: 0.0 standard drinks of alcohol   Social History   Substance and Sexual Activity  Drug Use No    Allergies: Allergies  Allergen Reactions   Ace Inhibitors Other (See Comments)    Unknown    Atorvastatin Other (See Comments)    Elevated LFT's Liver enzymes go up   Gabapentin  Other (See Comments)    Unknown    Metformin And Related Other (See Comments)    Reaction is a side effect   Oxycodone-Acetaminophen  Other (See Comments)    Unknown     Medications: Current Outpatient Medications  Medication Sig Dispense Refill   acetaminophen  (TYLENOL ) 500 MG tablet Take 1,000 mg by mouth every 6 (six) hours as needed (pain).     alendronate  (FOSAMAX ) 70 MG tablet Take 1 tablet (70 mg total) by mouth once a week. Take with a full glass of water  on an empty stomach. 12 tablet 3    ALPRAZolam  (XANAX ) 0.5 MG tablet TAKE 1 TABLET BY MOUTH AT BEDTIME. 30 tablet 5   amLODipine  (NORVASC ) 10 MG tablet Take 1 tablet (10 mg total) by mouth daily. 90 tablet 1   Ascorbic Acid (VITAMIN C) 1000 MG tablet Take 1,000 mg by mouth daily.     cholecalciferol (VITAMIN D ) 1000 UNITS tablet Take 1,000 Units by mouth daily.     Elderberry-Vitamin C-Zinc (ELDERBERRY EXTRACT PO) Take by mouth. One tablespoon per day.     fluticasone  (FLONASE ) 50 MCG/ACT nasal spray Place 2 sprays into both nostrils daily. (Patient taking differently: Place 2 sprays into both nostrils as needed.) 1 g 0   glipiZIDE  (GLUCOTROL ) 5 MG tablet 2 tablets in the morning and 1 and 1/2 in the evening as directed to a maximum of 2 tablets in the morning and 2 tablets in the evening as directed (Patient taking differently: 1 and 1/2  BID) 360 tablet 1   glucose blood (ACCU-CHEK GUIDE TEST) test strip Use to check blood sugar once daily as  directed. 50 strip 2   Multiple Vitamins-Minerals (MACULAR VITAMIN BENEFIT PO) Take 1 tablet by mouth in the morning and at bedtime.     omeprazole  (PRILOSEC) 40 MG capsule Take 1 capsule (40 mg total) by mouth daily. 90 capsule 3   OVER THE COUNTER MEDICATION IB Guard BID     rosuvastatin  (CRESTOR ) 40 MG tablet Take 1 tablet (40 mg total) by mouth daily. 90 tablet 1   sertraline  (ZOLOFT ) 50 MG tablet TAKE (1) TABLET BY MOUTH ONCE DAILY. 90 tablet 1   sitaGLIPtin (JANUVIA) 50 MG tablet Take 1 tablet (50 mg total) by mouth daily. 30 tablet 5   valsartan  (DIOVAN ) 160 MG tablet TAKE ONE TABLET BY MOUTH TWICE DAILY 90 tablet 3   zinc gluconate 50 MG tablet Take 50 mg by mouth daily.     docusate sodium  (COLACE) 100 MG capsule Take 300 mg by mouth at bedtime. (Patient not taking: Reported on 10/11/2024)     hyoscyamine  (LEVSIN  SL) 0.125 MG SL tablet Place 1 tablet (0.125 mg total) under the tongue every 6 (six) hours as needed (abdominal pain). (Patient not taking: Reported on 10/11/2024) 90 tablet  1   ondansetron  (ZOFRAN ) 4 MG tablet Take 1 tablet (4 mg total) by mouth every 8 (eight) hours as needed for nausea or vomiting. (Patient not taking: Reported on 10/11/2024) 30 tablet 1   potassium chloride  SA (KLOR-CON  M) 20 MEQ tablet Take 1 tablet (20 mEq total) by mouth 2 (two) times daily. (Patient not taking: Reported on 10/11/2024) 10 tablet 0   No current facility-administered medications for this visit.    Review of Systems: GENERAL: negative for malaise, night sweats HEENT: No changes in hearing or vision, no nose bleeds or other nasal problems. NECK: Negative for lumps, goiter, pain and significant neck swelling RESPIRATORY: Negative for cough, wheezing CARDIOVASCULAR: Negative for chest pain, leg swelling, palpitations, orthopnea GI: SEE HPI MUSCULOSKELETAL: Negative for joint pain or swelling, back pain, and muscle pain. SKIN: Negative for lesions, rash PSYCH: Negative for sleep disturbance, mood disorder and recent psychosocial stressors. HEMATOLOGY Negative for prolonged bleeding, bruising easily, and swollen nodes. ENDOCRINE: Negative for cold or heat intolerance, polyuria, polydipsia and goiter. NEURO: negative for tremor, gait imbalance, syncope and seizures. The remainder of the review of systems is noncontributory.   Physical Exam: BP 114/73 (BP Location: Left Arm, Patient Position: Sitting, Cuff Size: Large)   Pulse 92   Temp 97.8 F (36.6 C) (Temporal)   Ht 5' 3 (1.6 m)   Wt 149 lb 12.8 oz (67.9 kg)   BMI 26.54 kg/m  GENERAL: The patient is AO x3, in no acute distress. HEENT: Head is normocephalic and atraumatic. EOMI are intact. Mouth is well hydrated and without lesions. NECK: Supple. No masses LUNGS: Clear to auscultation. No presence of rhonchi/wheezing/rales. Adequate chest expansion HEART: RRR, normal s1 and s2. ABDOMEN: Soft, nontender, no guarding, no peritoneal signs, and nondistended. BS +. No masses. EXTREMITIES: Without any cyanosis, clubbing,  rash, lesions or edema. NEUROLOGIC: AOx3, no focal motor deficit. SKIN: no jaundice, no rashes  Imaging/Labs: as above  I personally reviewed and interpreted the available labs, imaging and endoscopic files.  Impression and Plan: Christie [name] is a 78 y.o. female with PMH vulvar cancer, depression, diabetes, HTN, HLD and IBS-D who presents for follow-up of abdominal discomfort .  Patient has presented significant abdominal discomfort that initially led to severe weight loss due to poor oral intake.  Has had cross-sectional abdominal imaging x  2 ruling out intra-abdominal pathology and vascular normalities explain her symptoms.  Also had recent endoscopic investigations which were unremarkable.  She reports having an some improvement of her symptoms when she stopped taking her Rybelsus  as previously advised.  I suspect the constellation of symptoms are related to GLP-1 agonist effect in the gastrointestinal system in the setting of IBS-D.  I advised her that ideally she should avoid GLP-1 inhibitors and DPP 4 medications if possible, which she will discuss with her PCP.  However, will rule out any endoluminal etiologies with a capsule endoscopy.  She was advised to stop the Levsin  for now and take Bentyl  as needed to relieve her symptoms, as well as continue with IBgard as needed.  She may also try taking probiotics as this may help with her symptoms.  -Schedule capsule endoscopy -Do not start Januvia, please discuss with PCP other non GLP-1/ DDP-4 option for diabetes -Start Bentyl  1 tablet q12h as needed for abdominal pain -Stop Levsin  -Can take IBGard as needed -If interested, can take VSL-1 or Align probiotic daily  All questions were answered.      Toribio Fortune, MD Gastroenterology and Hepatology [facility] Gastroenterology

## 2024-10-11 NOTE — Patient Instructions (Signed)
 Schedule capsule endoscopy Do not start Januvia, please discuss with PCP other non GLP-1/ DDP-4 option for diabetes Start Bentyl  1 tablet q12h as needed for abdominal pain Stop Levsin  Can take IBGard as needed If interested, can take VSL-1 or Align probiotic daily

## 2024-10-14 ENCOUNTER — Ambulatory Visit: Payer: Self-pay | Admitting: Family Medicine

## 2024-10-14 ENCOUNTER — Other Ambulatory Visit: Payer: Self-pay | Admitting: Family Medicine

## 2024-10-14 DIAGNOSIS — E876 Hypokalemia: Secondary | ICD-10-CM

## 2024-10-14 DIAGNOSIS — E119 Type 2 diabetes mellitus without complications: Secondary | ICD-10-CM

## 2024-10-14 MED ORDER — POTASSIUM CHLORIDE CRYS ER 10 MEQ PO TBCR: 10.0000 meq | EXTENDED_RELEASE_TABLET | Freq: Every day | ORAL | 1 refills | Status: DC

## 2024-10-14 MED ORDER — GLIPIZIDE 5 MG PO TABS: ORAL_TABLET | ORAL | 1 refills | Status: AC

## 2024-10-16 ENCOUNTER — Inpatient Hospital Stay (HOSPITAL_COMMUNITY)
Admission: EM | Admit: 2024-10-16 | Discharge: 2024-10-19 | DRG: 872 | Disposition: A | Discharge status: Home / Self Care | Source: Ambulatory Visit | Attending: Hospitalist | Admitting: Hospitalist

## 2024-10-16 ENCOUNTER — Other Ambulatory Visit: Payer: Self-pay

## 2024-10-16 ENCOUNTER — Encounter (HOSPITAL_COMMUNITY): Payer: Self-pay

## 2024-10-16 ENCOUNTER — Emergency Department (HOSPITAL_COMMUNITY)

## 2024-10-16 ENCOUNTER — Ambulatory Visit: Payer: Self-pay

## 2024-10-16 DIAGNOSIS — Z794 Long term (current) use of insulin: Secondary | ICD-10-CM

## 2024-10-16 DIAGNOSIS — Z79899 Other long term (current) drug therapy: Secondary | ICD-10-CM

## 2024-10-16 DIAGNOSIS — I10 Essential (primary) hypertension: Secondary | ICD-10-CM | POA: Diagnosis present

## 2024-10-16 DIAGNOSIS — Z833 Family history of diabetes mellitus: Secondary | ICD-10-CM

## 2024-10-16 DIAGNOSIS — N132 Hydronephrosis with renal and ureteral calculous obstruction: Secondary | ICD-10-CM | POA: Diagnosis not present

## 2024-10-16 DIAGNOSIS — A4151 Sepsis due to Escherichia coli [E. coli]: Principal | ICD-10-CM | POA: Diagnosis present

## 2024-10-16 DIAGNOSIS — I700 Atherosclerosis of aorta: Secondary | ICD-10-CM | POA: Diagnosis not present

## 2024-10-16 DIAGNOSIS — R109 Unspecified abdominal pain: Secondary | ICD-10-CM | POA: Diagnosis present

## 2024-10-16 DIAGNOSIS — E860 Dehydration: Secondary | ICD-10-CM | POA: Diagnosis not present

## 2024-10-16 DIAGNOSIS — Z90710 Acquired absence of both cervix and uterus: Secondary | ICD-10-CM | POA: Diagnosis not present

## 2024-10-16 DIAGNOSIS — A419 Sepsis, unspecified organism: Secondary | ICD-10-CM | POA: Diagnosis not present

## 2024-10-16 DIAGNOSIS — N136 Pyonephrosis: Secondary | ICD-10-CM | POA: Diagnosis present

## 2024-10-16 DIAGNOSIS — Z87442 Personal history of urinary calculi: Secondary | ICD-10-CM | POA: Diagnosis present

## 2024-10-16 DIAGNOSIS — Z800 Family history of malignant neoplasm of digestive organs: Secondary | ICD-10-CM | POA: Diagnosis not present

## 2024-10-16 DIAGNOSIS — Z9049 Acquired absence of other specified parts of digestive tract: Secondary | ICD-10-CM | POA: Diagnosis not present

## 2024-10-16 DIAGNOSIS — Z7984 Long term (current) use of oral hypoglycemic drugs: Secondary | ICD-10-CM | POA: Diagnosis not present

## 2024-10-16 DIAGNOSIS — Z885 Allergy status to narcotic agent status: Secondary | ICD-10-CM | POA: Diagnosis not present

## 2024-10-16 DIAGNOSIS — K589 Irritable bowel syndrome without diarrhea: Secondary | ICD-10-CM | POA: Diagnosis present

## 2024-10-16 DIAGNOSIS — F32A Depression, unspecified: Secondary | ICD-10-CM | POA: Diagnosis not present

## 2024-10-16 DIAGNOSIS — Z8544 Personal history of malignant neoplasm of other female genital organs: Secondary | ICD-10-CM | POA: Diagnosis not present

## 2024-10-16 DIAGNOSIS — N12 Tubulo-interstitial nephritis, not specified as acute or chronic: Principal | ICD-10-CM | POA: Diagnosis present

## 2024-10-16 DIAGNOSIS — Z981 Arthrodesis status: Secondary | ICD-10-CM

## 2024-10-16 DIAGNOSIS — H9190 Unspecified hearing loss, unspecified ear: Secondary | ICD-10-CM | POA: Diagnosis present

## 2024-10-16 DIAGNOSIS — Z7983 Long term (current) use of bisphosphonates: Secondary | ICD-10-CM | POA: Diagnosis not present

## 2024-10-16 DIAGNOSIS — G8929 Other chronic pain: Secondary | ICD-10-CM | POA: Diagnosis present

## 2024-10-16 DIAGNOSIS — E876 Hypokalemia: Secondary | ICD-10-CM | POA: Diagnosis not present

## 2024-10-16 DIAGNOSIS — R7881 Bacteremia: Secondary | ICD-10-CM | POA: Diagnosis present

## 2024-10-16 DIAGNOSIS — Z8249 Family history of ischemic heart disease and other diseases of the circulatory system: Secondary | ICD-10-CM

## 2024-10-16 DIAGNOSIS — E785 Hyperlipidemia, unspecified: Secondary | ICD-10-CM | POA: Diagnosis present

## 2024-10-16 DIAGNOSIS — N281 Cyst of kidney, acquired: Secondary | ICD-10-CM | POA: Diagnosis not present

## 2024-10-16 DIAGNOSIS — R590 Localized enlarged lymph nodes: Secondary | ICD-10-CM | POA: Diagnosis not present

## 2024-10-16 DIAGNOSIS — E1169 Type 2 diabetes mellitus with other specified complication: Secondary | ICD-10-CM | POA: Diagnosis not present

## 2024-10-16 DIAGNOSIS — Z888 Allergy status to other drugs, medicaments and biological substances status: Secondary | ICD-10-CM | POA: Diagnosis not present

## 2024-10-16 LAB — URINALYSIS, ROUTINE W REFLEX MICROSCOPIC
Bilirubin Urine: NEGATIVE
Glucose, UA: >=500 mg/dL — AB
Ketones, ur: 5 mg/dL — AB
Nitrite: NEGATIVE
Protein, ur: >=300 mg/dL — AB
RBC / HPF: >50 RBC/hpf (ref 0–5)
Specific Gravity, Urine: 1.021 (ref 1.005–1.030)
WBC, UA: >50 WBC/hpf (ref 0–5)
pH: 5 (ref 5.0–8.0)

## 2024-10-16 LAB — LIPASE, BLOOD: Lipase: 24 U/L (ref 11–51)

## 2024-10-16 LAB — CBC WITH DIFFERENTIAL/PLATELET
Abs Immature Granulocytes: 0.13 K/uL — ABNORMAL HIGH (ref 0.00–0.07)
Basophils Absolute: 0 K/uL (ref 0.0–0.1)
Basophils Relative: 0 %
Eosinophils Absolute: 0 K/uL (ref 0.0–0.5)
Eosinophils Relative: 0 %
HCT: 35.8 % — ABNORMAL LOW (ref 36.0–46.0)
Hemoglobin: 12 g/dL (ref 12.0–15.0)
Immature Granulocytes: 1 %
Lymphocytes Relative: 3 %
Lymphs Abs: 0.4 K/uL — ABNORMAL LOW (ref 0.7–4.0)
MCH: 31.2 pg (ref 26.0–34.0)
MCHC: 33.5 g/dL (ref 30.0–36.0)
MCV: 93 fL (ref 80.0–100.0)
Monocytes Absolute: 0.5 K/uL (ref 0.1–1.0)
Monocytes Relative: 3 %
Neutro Abs: 13.5 K/uL — ABNORMAL HIGH (ref 1.7–7.7)
Neutrophils Relative %: 93 %
Platelets: 152 K/uL (ref 150–400)
RBC: 3.85 MIL/uL — ABNORMAL LOW (ref 3.87–5.11)
RDW: 14.6 % (ref 11.5–15.5)
WBC: 14.6 K/uL — ABNORMAL HIGH (ref 4.0–10.5)
nRBC: 0 % (ref 0.0–0.2)

## 2024-10-16 LAB — COMPREHENSIVE METABOLIC PANEL WITH GFR
ALT: 15 U/L (ref 0–44)
AST: 26 U/L (ref 15–41)
Albumin: 3.6 g/dL (ref 3.5–5.0)
Alkaline Phosphatase: 78 U/L (ref 38–126)
Anion gap: 15 (ref 5–15)
BUN: 10 mg/dL (ref 8–23)
CO2: 24 mmol/L (ref 22–32)
Calcium: 9.2 mg/dL (ref 8.9–10.3)
Chloride: 99 mmol/L (ref 98–111)
Creatinine, Ser: 0.6 mg/dL (ref 0.44–1.00)
GFR, Estimated: >60 mL/min (ref >60)
Glucose, Bld: 291 mg/dL — ABNORMAL HIGH (ref 70–99)
Potassium: 2.7 mmol/L — CL (ref 3.5–5.1)
Sodium: 137 mmol/L (ref 135–145)
Total Bilirubin: 0.9 mg/dL (ref 0.0–1.2)
Total Protein: 6 g/dL — ABNORMAL LOW (ref 6.5–8.1)

## 2024-10-16 LAB — LACTIC ACID, PLASMA: Lactic Acid, Venous: 1.5 mmol/L (ref 0.5–1.9)

## 2024-10-16 LAB — POTASSIUM: Potassium: 2.9 mmol/L — ABNORMAL LOW (ref 3.5–5.1)

## 2024-10-16 LAB — HIV ANTIBODY (ROUTINE TESTING W REFLEX): HIV Screen 4th Generation wRfx: NONREACTIVE

## 2024-10-16 MED ORDER — ACETAMINOPHEN 650 MG RE SUPP: 650.0000 mg | Freq: Four times a day (QID) | RECTAL | Status: DC | PRN

## 2024-10-16 MED ORDER — SERTRALINE HCL 50 MG PO TABS
50.0000 mg | ORAL_TABLET | Freq: Every day | ORAL | Status: DC
  Administered 2024-10-17 – 2024-10-19 (×3): 50 mg via ORAL
  Filled 2024-10-16 (×3): qty 1

## 2024-10-16 MED ORDER — VITAMIN D 25 MCG (1000 UNIT) PO TABS
1000.0000 [IU] | ORAL_TABLET | Freq: Every day | ORAL | Status: DC
  Administered 2024-10-16 – 2024-10-19 (×4): 1000 [IU] via ORAL
  Filled 2024-10-16 (×4): qty 1

## 2024-10-16 MED ORDER — POTASSIUM CHLORIDE CRYS ER 20 MEQ PO TBCR
40.0000 meq | EXTENDED_RELEASE_TABLET | Freq: Once | ORAL | Status: AC
  Administered 2024-10-16: 40 meq via ORAL
  Filled 2024-10-16: qty 2

## 2024-10-16 MED ORDER — LACTATED RINGERS IV SOLN: INTRAVENOUS | Status: DC

## 2024-10-16 MED ORDER — ALPRAZOLAM 0.5 MG PO TABS
0.5000 mg | ORAL_TABLET | Freq: Every day | ORAL | Status: DC
  Administered 2024-10-16 – 2024-10-18 (×3): 0.5 mg via ORAL
  Filled 2024-10-16 (×3): qty 1

## 2024-10-16 MED ORDER — ACETAMINOPHEN 325 MG PO TABS
650.0000 mg | ORAL_TABLET | Freq: Four times a day (QID) | ORAL | Status: DC | PRN
  Administered 2024-10-16 – 2024-10-19 (×8): 650 mg via ORAL
  Filled 2024-10-16 (×10): qty 2

## 2024-10-16 MED ORDER — AMLODIPINE BESYLATE 5 MG PO TABS
10.0000 mg | ORAL_TABLET | Freq: Every day | ORAL | Status: DC
  Administered 2024-10-16 – 2024-10-19 (×3): 10 mg via ORAL
  Filled 2024-10-16 (×4): qty 2

## 2024-10-16 MED ORDER — MAGNESIUM SULFATE 2 GM/50ML IV SOLN
2.0000 g | Freq: Once | INTRAVENOUS | Status: AC
  Administered 2024-10-16: 2 g via INTRAVENOUS
  Filled 2024-10-16: qty 50

## 2024-10-16 MED ORDER — SODIUM CHLORIDE 0.9 % IV BOLUS
1000.0000 mL | Freq: Once | INTRAVENOUS | Status: AC
  Administered 2024-10-16: 1000 mL via INTRAVENOUS

## 2024-10-16 MED ORDER — POTASSIUM CHLORIDE 10 MEQ/100ML IV SOLN
10.0000 meq | INTRAVENOUS | Status: AC
  Administered 2024-10-16 (×4): 10 meq via INTRAVENOUS
  Filled 2024-10-16 (×4): qty 100

## 2024-10-16 MED ORDER — IOHEXOL 300 MG/ML  SOLN
100.0000 mL | Freq: Once | INTRAMUSCULAR | Status: AC | PRN
  Administered 2024-10-16: 100 mL via INTRAVENOUS

## 2024-10-16 MED ORDER — ENOXAPARIN SODIUM 40 MG/0.4ML IJ SOSY
40.0000 mg | PREFILLED_SYRINGE | INTRAMUSCULAR | Status: DC
  Administered 2024-10-16 – 2024-10-18 (×3): 40 mg via SUBCUTANEOUS
  Filled 2024-10-16 (×3): qty 0.4

## 2024-10-16 MED ORDER — POTASSIUM CHLORIDE 10 MEQ/100ML IV SOLN
10.0000 meq | Freq: Once | INTRAVENOUS | Status: AC
  Administered 2024-10-16: 10 meq via INTRAVENOUS
  Filled 2024-10-16: qty 100

## 2024-10-16 MED ORDER — SODIUM CHLORIDE 0.9 % IV SOLN
1.0000 g | INTRAVENOUS | Status: DC
  Administered 2024-10-17: 1 g via INTRAVENOUS
  Filled 2024-10-16: qty 10

## 2024-10-16 MED ORDER — VITAMIN C 500 MG PO TABS
1000.0000 mg | ORAL_TABLET | Freq: Every day | ORAL | Status: DC
  Administered 2024-10-16 – 2024-10-19 (×4): 1000 mg via ORAL
  Filled 2024-10-16 (×4): qty 2

## 2024-10-16 MED ORDER — POTASSIUM CHLORIDE 20 MEQ PO PACK
20.0000 meq | PACK | Freq: Two times a day (BID) | ORAL | Status: DC
  Administered 2024-10-16 – 2024-10-18 (×4): 20 meq via ORAL
  Filled 2024-10-16 (×4): qty 1

## 2024-10-16 MED ORDER — SODIUM CHLORIDE 0.9 % IV SOLN
1.0000 g | Freq: Once | INTRAVENOUS | Status: AC
  Administered 2024-10-16: 1 g via INTRAVENOUS
  Filled 2024-10-16: qty 10

## 2024-10-16 MED ORDER — SENNOSIDES-DOCUSATE SODIUM 8.6-50 MG PO TABS
1.0000 | ORAL_TABLET | Freq: Every day | ORAL | Status: DC
  Administered 2024-10-16 – 2024-10-18 (×3): 1 via ORAL
  Filled 2024-10-16 (×3): qty 1

## 2024-10-16 MED ORDER — INSULIN ASPART 100 UNIT/ML IJ SOLN
0.0000 [IU] | Freq: Three times a day (TID) | INTRAMUSCULAR | Status: DC
  Administered 2024-10-17: 5 [IU] via SUBCUTANEOUS
  Administered 2024-10-17: 8 [IU] via SUBCUTANEOUS
  Administered 2024-10-17 – 2024-10-18 (×2): 5 [IU] via SUBCUTANEOUS
  Administered 2024-10-18: 3 [IU] via SUBCUTANEOUS
  Administered 2024-10-18: 8 [IU] via SUBCUTANEOUS
  Administered 2024-10-19 (×2): 5 [IU] via SUBCUTANEOUS

## 2024-10-16 NOTE — ED Provider Notes (Signed)
  EMERGENCY DEPARTMENT AT [facility] Provider Note  CSN: [id number] Arrival date & time: 10/16/24 1035  Chief Complaint(s) dehydrated  HPI Christie [name] is a 78 y.o. female with past medical history as below, significant for DM, hld, htn, HOH, IBS who presents to the ED with complaint of abd pain, chills  Pt here w/ acute on chronic abd pain. Was seen earlier this month with abd pain, generalized, unclear etiology. F/w Dr Eartha w/ GI. Pending capsule study. Concern for possibly worsening IBS provoking her symptoms. Having worsening gen abd pain/cramping last week or so, poor appetite, concern for another UTI, chills this am but no fever. No vomiting but having some nausea. No change to bowel/bladder fxn, no bleeding or melena reported.   Past Medical History Past Medical History:  Diagnosis Date   Cancer (HCC) 1999   external vulva lesion   Depression    DM (diabetes mellitus) (HCC) 2012   oral medications   Elevated liver enzymes    Hearing deficit    Heart murmur    History of kidney stones    Hyperlipidemia    Hyperlipidemia associated with type 2 diabetes mellitus (HCC) 02/25/2020   Hypertension 2000   Spastic colon    Patient Active Problem List   Diagnosis Date Noted   Loss of weight 10/11/2024   Dizziness 01/30/2024   Sensory hearing loss, bilateral 12/16/2023   Tinnitus of both ears 12/16/2023   Other specified disorders of eustachian tube, bilateral 12/16/2023   IBS (irritable bowel syndrome) 05/10/2021   Abdominal pain 10/22/2020   Hyperlipidemia associated with type 2 diabetes mellitus (HCC) 02/25/2020   Lumbar spine scoliosis 07/04/2017   Aortic stenosis, mild 11/14/2016   Elevated alkaline phosphatase level 05/17/2015   Urethral diverticulum 10/14/2014   Lichen sclerosus et atrophicus 10/14/2014   Insomnia 09/29/2014   Depression 04/04/2014   Essential hypertension, benign 08/13/2013   History of kidney stones 08/13/2013   Elevated  liver enzymes 08/13/2013   Home Medication(s) Prior to Admission medications   Medication Sig Start Date End Date Taking? Authorizing Provider  acetaminophen  (TYLENOL ) 500 MG tablet Take 1,000 mg by mouth every 6 (six) hours as needed (pain).    [provider]  alendronate  (FOSAMAX ) 70 MG tablet Take 1 tablet (70 mg total) by mouth once a week. Take with a full glass of water  on an empty stomach. 04/04/24   Alphonsa [name], MD  ALPRAZolam  (XANAX ) 0.5 MG tablet TAKE 1 TABLET BY MOUTH AT BEDTIME. 05/28/24   Alphonsa [name], MD  amLODipine  (NORVASC ) 10 MG tablet Take 1 tablet (10 mg total) by mouth daily. 08/05/24   Alphonsa [name], MD  Ascorbic Acid (VITAMIN C) 1000 MG tablet Take 1,000 mg by mouth daily.    [provider]  cholecalciferol (VITAMIN D ) 1000 UNITS tablet Take 1,000 Units by mouth daily.    [provider]  dicyclomine  (BENTYL ) 10 MG capsule Take 1 capsule (10 mg total) by mouth every 12 (twelve) hours as needed for spasms. 10/11/24   Eartha Angelia Sieving, MD  docusate sodium  (COLACE) 100 MG capsule Take 300 mg by mouth at bedtime. Patient not taking: Reported on 10/11/2024    [provider]  Elderberry-Vitamin C-Zinc (ELDERBERRY EXTRACT PO) Take by mouth. One tablespoon per day.    [provider]  fluticasone  (FLONASE ) 50 MCG/ACT nasal spray Place 2 sprays into both nostrils daily. Patient taking differently: Place 2 sprays into both nostrils as needed. 12/14/23 10/11/24  Karis Clunes, MD  glipiZIDE  (GLUCOTROL ) 5 MG tablet 2 in the am 2 in the pm 10/14/24   [name], Glendia A, MD  glucose blood (ACCU-CHEK GUIDE TEST) test strip Use to check blood sugar once daily as directed. 06/17/24   Alphonsa [name], MD  Multiple Vitamins-Minerals (MACULAR VITAMIN BENEFIT PO) Take 1 tablet by mouth in the morning and at bedtime.    [provider]  omeprazole  (PRILOSEC) 40 MG capsule Take 1 capsule (40 mg total) by mouth daily. 08/22/24   Eartha Angelia Sieving, MD  ondansetron  (ZOFRAN ) 4 MG tablet Take 1 tablet (4 mg total) by mouth every 8 (eight) hours as needed for nausea or vomiting. Patient not taking: Reported on 10/11/2024 09/30/24   Eartha Angelia Sieving, MD  OVER THE COUNTER MEDICATION IB Guard BID    [provider]  potassium chloride  (KLOR-CON  M) 10 MEQ tablet Take 1 tablet (10 mEq total) by mouth daily. 10/14/24   Alphonsa [name], MD  rosuvastatin  (CRESTOR ) 40 MG tablet Take 1 tablet (40 mg total) by mouth daily. 08/05/24   Alphonsa [name], MD  sertraline  (ZOLOFT ) 50 MG tablet TAKE (1) TABLET BY MOUTH ONCE DAILY. 05/09/24   Alphonsa [name], MD  valsartan  (DIOVAN ) 160 MG tablet TAKE ONE TABLET BY MOUTH TWICE DAILY 09/02/24   Alphonsa [name], MD  zinc gluconate 50 MG tablet Take 50 mg by mouth daily.    [provider]                                                                                                                                    Past Surgical History Past Surgical History:  Procedure Laterality Date   ANTERIOR LAT LUMBAR FUSION Right 07/04/2017   Procedure: Right LUMBAR TWO-THREE LUMBAR THREE-FOUR LUMBAR FOUR-FIVE Anterior lateral lumbar interbody fusion with percutaneous pedicle screws;  Surgeon: Unice Pac, MD;  Location: [facility] OR;  Service: Neurosurgery;  Laterality: Right;  Right lateral approach    ANTERIOR LATERAL LUMBAR FUSION WITH PERCUTANEOUS SCREW 3 LEVEL N/A 07/04/2017   Procedure: PERCUTANEOUS SCREW THREE LEVEL LUMBAR TWO-THREE LUMBAR THREE-FOUR LUMBAR FOUR-FIVE;  Surgeon: Unice Pac, MD;  Location: [facility] OR;  Service: Neurosurgery;  Laterality: N/A;  Prone   BACK SURGERY  2004 and 2017   BIOPSY  02/23/2021   Procedure: BIOPSY;  Surgeon: Eartha Angelia Sieving, MD;  Location: [facility] SUITE;  Service: Gastroenterology;;   CHOLECYSTECTOMY     COLONOSCOPY  01/2004   COLONOSCOPY N/A 02/13/2014   Procedure: COLONOSCOPY;  Surgeon: Claudis RAYMOND Rivet, MD;  Location: [facility];   Service: Endoscopy;  Laterality: N/A;  930-moved to 100 Ann notified pt   COLONOSCOPY WITH PROPOFOL  N/A 02/23/2021   Procedure: COLONOSCOPY WITH PROPOFOL ;  Surgeon: Eartha Angelia Sieving, MD;  Location: [facility];  Service: Gastroenterology;  Laterality: N/A;  AM   ESOPHAGOGASTRODUODENOSCOPY  09/2005   ESOPHAGOGASTRODUODENOSCOPY N/A 08/22/2024  Procedure: EGD (ESOPHAGOGASTRODUODENOSCOPY);  Surgeon: Eartha Flavors, Toribio, MD;  Location: [facility];  Service: Gastroenterology;  Laterality: N/A;  8:00 am, asa 1-2   EYE SURGERY Right    catatract removed   EYE SURGERY Right    macular   surgery    KIDNEY STONE SURGERY     for a staghorn kidney stone   POLYPECTOMY  02/23/2021   Procedure: POLYPECTOMY;  Surgeon: Eartha Flavors, Toribio, MD;  Location: [facility];  Service: Gastroenterology;;   STAPEDECTOMY Bilateral    Right 1991 and Left 1994   TOTAL ABDOMINAL HYSTERECTOMY  1982   vulva cancer surgery  1999   Family History Family History  Problem Relation Age of Onset   Diabetes Father    Hypertension Father    Dementia Father    Pancreatic cancer Mother     Social History Social History   Tobacco Use   Smoking status: Never   Smokeless tobacco: Never  Vaping Use   Vaping status: Never Used  Substance Use Topics   Alcohol use: No    Alcohol/week: 0.0 standard drinks of alcohol   Drug use: No   Allergies Ace inhibitors, Atorvastatin, Gabapentin , Metformin and related, and Oxycodone-acetaminophen   Review of Systems A thorough review of systems was obtained and all systems are negative except as noted in the HPI and PMH.   Physical Exam Vital Signs  I have reviewed the triage vital signs BP (!) 134/43 (BP Location: Right Arm)   Pulse (!) 111   Temp 97.9 F (36.6 C) (Oral)   Resp 16   Ht 5' 3 (1.6 m)   Wt 67.9 kg   SpO2 98%   BMI 26.54 kg/m  Physical Exam Vitals and nursing note reviewed.  Constitutional:      General: She is not in acute  distress.    Appearance: Normal appearance. She is well-developed. She is not ill-appearing.  HENT:     Head: Normocephalic and atraumatic.     Right Ear: External ear normal.     Left Ear: External ear normal.     Nose: Nose normal.     Mouth/Throat:     Mouth: Mucous membranes are dry.  Eyes:     General: No scleral icterus.       Right eye: No discharge.        Left eye: No discharge.  Cardiovascular:     Rate and Rhythm: Normal rate.  Pulmonary:     Effort: Pulmonary effort is normal. No respiratory distress.     Breath sounds: No stridor.  Abdominal:     General: Abdomen is flat. There is no distension.     Palpations: Abdomen is soft.     Tenderness: There is generalized abdominal tenderness. There is no guarding.     Comments: Not peritoneal   Musculoskeletal:        General: No deformity.     Cervical back: No rigidity.  Skin:    General: Skin is warm and dry.     Coloration: Skin is not cyanotic, jaundiced or pale.  Neurological:     Mental Status: She is alert.  Psychiatric:        Speech: Speech normal.        Behavior: Behavior normal. Behavior is cooperative.     ED Results and Treatments Labs (all labs ordered are listed, but only abnormal results are displayed) Labs Reviewed  URINALYSIS, ROUTINE W REFLEX MICROSCOPIC - Abnormal; Notable for the following components:  Result Value   APPearance TURBID (*)    Glucose, UA >=500 (*)    Hgb urine dipstick LARGE (*)    Ketones, ur 5 (*)    Protein, ur >=300 (*)    Leukocytes,Ua MODERATE (*)    Bacteria, UA RARE (*)    Non Squamous Epithelial 0-5 (*)    All other components within normal limits  CBC WITH DIFFERENTIAL/PLATELET - Abnormal; Notable for the following components:   WBC 14.6 (*)    RBC 3.85 (*)    HCT 35.8 (*)    Neutro Abs 13.5 (*)    Lymphs Abs 0.4 (*)    Abs Immature Granulocytes 0.13 (*)    All other components within normal limits  URINE CULTURE  LIPASE, BLOOD  COMPREHENSIVE  METABOLIC PANEL WITH GFR                                                                                                                          Radiology No results found.  Pertinent labs & imaging results that were available during my care of the patient were reviewed by me and considered in my medical decision making (see MDM for details).  Medications Ordered in ED Medications  sodium chloride  0.9 % bolus 1,000 mL (has no administration in time range)                                                                                                                                     Procedures Procedures  (including critical care time)  Medical Decision Making / ED Course    Medical Decision Making:    LEVITA MONICAL is a 78 y.o. female with past medical history as below, significant for DM, hld, htn, HOH, IBS who presents to the ED with complaint of abd pain, chills. The complaint involves an extensive differential diagnosis and also carries with it a high risk of complications and morbidity.  Serious etiology was considered. Ddx includes but is not limited to: Differential diagnosis includes but is not exclusive to ovarian cyst, ovarian torsion, acute appendicitis, urinary tract infection, endometriosis, bowel obstruction, hernia, colitis, renal colic, gastroenteritis, volvulus etc.   Complete initial physical exam performed, notably the patient was in NAD, hds.    Reviewed and confirmed nursing documentation for past medical history, family history, social history.  Vital signs reviewed.    Acute on chronic abd pain > -  ongoing abd pain over last few months, worsening, f/w Dr Eartha, re-assuring workup earlier this month w/ CTA a/p - worsening pain this week, poor po, chills, concern uti/dehydration per family        ***               Additional history obtained: -Additional history obtained from {[email or web address]:28072} -External records from  outside source obtained and reviewed including: Chart review including previous notes, labs, imaging, consultation notes including  ***   Lab Tests: -I ordered, reviewed, and interpreted labs.   The pertinent results include:   Labs Reviewed  URINALYSIS, ROUTINE W REFLEX MICROSCOPIC - Abnormal; Notable for the following components:      Result Value   APPearance TURBID (*)    Glucose, UA >=500 (*)    Hgb urine dipstick LARGE (*)    Ketones, ur 5 (*)    Protein, ur >=300 (*)    Leukocytes,Ua MODERATE (*)    Bacteria, UA RARE (*)    Non Squamous Epithelial 0-5 (*)    All other components within normal limits  CBC WITH DIFFERENTIAL/PLATELET - Abnormal; Notable for the following components:   WBC 14.6 (*)    RBC 3.85 (*)    HCT 35.8 (*)    Neutro Abs 13.5 (*)    Lymphs Abs 0.4 (*)    Abs Immature Granulocytes 0.13 (*)    All other components within normal limits  URINE CULTURE  LIPASE, BLOOD  COMPREHENSIVE METABOLIC PANEL WITH GFR    Notable for ***  EKG   EKG Interpretation Date/Time:    Ventricular Rate:    PR Interval:    QRS Duration:    QT Interval:    QTC Calculation:   R Axis:      Text Interpretation:           Imaging Studies ordered: I ordered imaging studies including *** I independently visualized the following imaging with scope of interpretation limited to determining acute life threatening conditions related to emergency care; findings noted above I agree with the radiologist interpretation If any imaging was obtained with contrast I closely monitored patient for any possible adverse reaction a/w contrast administration in the emergency department   Medicines ordered and prescription drug management: Meds ordered this encounter  Medications   sodium chloride  0.9 % bolus 1,000 mL    -I have reviewed the patients home medicines and have made adjustments as needed   Consultations Obtained: I requested consultation with the ***,  and discussed  lab and imaging findings as well as pertinent plan - they recommend: ***   Cardiac Monitoring: The patient was maintained on a cardiac monitor.  I personally viewed and interpreted the cardiac monitored which showed an underlying rhythm of: *** Continuous pulse oximetry interpreted by myself, ***% on ***.    Social Determinants of Health:  Diagnosis or treatment significantly limited by social determinants of health: {wssoc:28071}   Reevaluation: After the interventions noted above, I reevaluated the patient and found that they have {resolved/improved/worsened:23923::improved}  Co morbidities that complicate the patient evaluation  Past Medical History:  Diagnosis Date   Cancer (HCC) 1999   external vulva lesion   Depression    DM (diabetes mellitus) (HCC) 2012   oral medications   Elevated liver enzymes    Hearing deficit    Heart murmur    History of kidney stones    Hyperlipidemia    Hyperlipidemia associated with type 2 diabetes mellitus (HCC) 02/25/2020   Hypertension  2000   Spastic colon       Dispostion: Disposition decision including need for hospitalization was considered, and patient {wsdispo:28070::discharged from emergency department.}    Final Clinical Impression(s) / ED Diagnoses Final diagnoses:  None

## 2024-10-16 NOTE — Sepsis Progress Note (Signed)
 Confirmed with bedside ED RN Lacinda that blood cultures were drawn before giving the antibiotic.

## 2024-10-16 NOTE — H&P (Signed)
 History and Physical    [name] FMW:[id number] DOB: 08/18/1946 DOA: 10/16/2024  PCP: Alphonsa [name], MD   Patient coming from: Home  I have personally briefly reviewed patient's old medical records in [facility]  Chief Complaint: Abdominal pain/chills  HPI: Christie [name] is a 78 y.o. female with medical history significant of diabetes mellitus, heart murmur, hypertension, hyperlipidemia, abdominal pain who presents to the ER with complaints of generalized abdominal pain, chills, nausea and vomiting.  Patient has been feeling sick for the past couple of weeks with generalized abdominal pain.  She also reports of having chills.  No definite fever.  She was seen by GI on 10/11/2024.  She also says she recently got a course of antibiotics for UTI within the past 3 weeks.  In the ER, vital signs are stable.  She was mildly tachycardic which improved with fluid.  UA was consistent with urinary tract infection.  CT scan shows left-sided pyelonephritis, mild hydronephrosis with calculus in the renal pelvis.  Oratory work is also notable for significant hypokalemia with potassium 2.7.  Patient is admitted for acute pyelonephritis./Hypokalemia.   Review of Systems: As per HPI otherwise all other systems were reviewed and are negative.   Past Medical History:  Diagnosis Date   Cancer [facility]) 1999   external vulva lesion   Depression    DM (diabetes mellitus) (HCC) 2012   oral medications   Elevated liver enzymes    Hearing deficit    Heart murmur    History of kidney stones    Hyperlipidemia    Hyperlipidemia associated with type 2 diabetes mellitus (HCC) 02/25/2020   Hypertension 2000   Spastic colon     Past Surgical History:  Procedure Laterality Date   ANTERIOR LAT LUMBAR FUSION Right 07/04/2017   Procedure: Right LUMBAR TWO-THREE LUMBAR THREE-FOUR LUMBAR FOUR-FIVE Anterior lateral lumbar interbody fusion with percutaneous pedicle screws;  Surgeon: Unice Pac, MD;   Location: [facility] OR;  Service: Neurosurgery;  Laterality: Right;  Right lateral approach    ANTERIOR LATERAL LUMBAR FUSION WITH PERCUTANEOUS SCREW 3 LEVEL N/A 07/04/2017   Procedure: PERCUTANEOUS SCREW THREE LEVEL LUMBAR TWO-THREE LUMBAR THREE-FOUR LUMBAR FOUR-FIVE;  Surgeon: Unice Pac, MD;  Location: [facility] OR;  Service: Neurosurgery;  Laterality: N/A;  Prone   BACK SURGERY  2004 and 2017   BIOPSY  02/23/2021   Procedure: BIOPSY;  Surgeon: Eartha Angelia Sieving, MD;  Location: [facility] SUITE;  Service: Gastroenterology;;   CHOLECYSTECTOMY     COLONOSCOPY  01/2004   COLONOSCOPY N/A 02/13/2014   Procedure: COLONOSCOPY;  Surgeon: Claudis RAYMOND Rivet, MD;  Location: [facility] SUITE;  Service: Endoscopy;  Laterality: N/A;  930-moved to 100 Ann notified pt   COLONOSCOPY WITH PROPOFOL  N/A 02/23/2021   Procedure: COLONOSCOPY WITH PROPOFOL ;  Surgeon: Eartha Angelia Sieving, MD;  Location: [facility];  Service: Gastroenterology;  Laterality: N/A;  AM   ESOPHAGOGASTRODUODENOSCOPY  09/2005   ESOPHAGOGASTRODUODENOSCOPY N/A 08/22/2024   Procedure: EGD (ESOPHAGOGASTRODUODENOSCOPY);  Surgeon: Eartha Angelia, Sieving, MD;  Location: [facility];  Service: Gastroenterology;  Laterality: N/A;  8:00 am, asa 1-2   EYE SURGERY Right    catatract removed   EYE SURGERY Right    macular   surgery    KIDNEY STONE SURGERY     for a staghorn kidney stone   POLYPECTOMY  02/23/2021   Procedure: POLYPECTOMY;  Surgeon: Eartha Angelia Sieving, MD;  Location: [facility];  Service: Gastroenterology;;   STAPEDECTOMY Bilateral  Right 1991 and Left 1994   TOTAL ABDOMINAL HYSTERECTOMY  1982   vulva cancer surgery  1999     reports that she has never smoked. She has never used smokeless tobacco. She reports that she does not drink alcohol and does not use drugs.  Allergies  Allergen Reactions   Ace Inhibitors Other (See Comments)    Unknown    Atorvastatin Other (See Comments)    Elevated LFT's Liver  enzymes go up   Gabapentin  Other (See Comments)    Unknown    Metformin And Related Other (See Comments)    Reaction is a side effect   Oxycodone-Acetaminophen  Other (See Comments)    Unknown     Family History  Problem Relation Age of Onset   Diabetes Father    Hypertension Father    Dementia Father    Pancreatic cancer Mother     Prior to Admission medications   Medication Sig Start Date End Date Taking? Authorizing Provider  acetaminophen  (TYLENOL ) 500 MG tablet Take 1,000 mg by mouth every 6 (six) hours as needed (pain).    [provider]  alendronate  (FOSAMAX ) 70 MG tablet Take 1 tablet (70 mg total) by mouth once a week. Take with a full glass of water  on an empty stomach. 04/04/24   Alphonsa [name], MD  ALPRAZolam  (XANAX ) 0.5 MG tablet TAKE 1 TABLET BY MOUTH AT BEDTIME. 05/28/24   [name], [name], MD  amLODipine  (NORVASC ) 10 MG tablet Take 1 tablet (10 mg total) by mouth daily. 08/05/24   Alphonsa [name], MD  Ascorbic Acid (VITAMIN C) 1000 MG tablet Take 1,000 mg by mouth daily.    [provider]  cholecalciferol (VITAMIN D ) 1000 UNITS tablet Take 1,000 Units by mouth daily.    [provider]  dicyclomine  (BENTYL ) 10 MG capsule Take 1 capsule (10 mg total) by mouth every 12 (twelve) hours as needed for spasms. 10/11/24   Eartha Angelia Sieving, MD  docusate sodium  (COLACE) 100 MG capsule Take 300 mg by mouth at bedtime. Patient not taking: Reported on 10/11/2024    [provider]  Elderberry-Vitamin C-Zinc (ELDERBERRY EXTRACT PO) Take by mouth. One tablespoon per day.    [provider]  fluticasone  (FLONASE ) 50 MCG/ACT nasal spray Place 2 sprays into both nostrils daily. Patient taking differently: Place 2 sprays into both nostrils as needed. 12/14/23 10/11/24  Karis Clunes, MD  glipiZIDE  (GLUCOTROL ) 5 MG tablet 2 in the am 2 in the pm 10/14/24   [name], Glendia A, MD  glucose blood (ACCU-CHEK GUIDE TEST) test strip Use to check blood  sugar once daily as directed. 06/17/24   Alphonsa [name], MD  Multiple Vitamins-Minerals (MACULAR VITAMIN BENEFIT PO) Take 1 tablet by mouth in the morning and at bedtime.    [provider]  omeprazole  (PRILOSEC) 40 MG capsule Take 1 capsule (40 mg total) by mouth daily. 08/22/24   Eartha Angelia Sieving, MD  ondansetron  (ZOFRAN ) 4 MG tablet Take 1 tablet (4 mg total) by mouth every 8 (eight) hours as needed for nausea or vomiting. Patient not taking: Reported on 10/11/2024 09/30/24   Eartha Angelia Sieving, MD  OVER THE COUNTER MEDICATION IB Guard BID    [provider]  potassium chloride  (KLOR-CON  M) 10 MEQ tablet Take 1 tablet (10 mEq total) by mouth daily. 10/14/24   Alphonsa [name], MD  rosuvastatin  (CRESTOR ) 40 MG tablet Take 1 tablet (40 mg total) by mouth daily. 08/05/24   [name],  Scott A, MD  sertraline  (ZOLOFT ) 50 MG tablet TAKE (1) TABLET BY MOUTH ONCE DAILY. 05/09/24   Alphonsa [name], MD  valsartan  (DIOVAN ) 160 MG tablet TAKE ONE TABLET BY MOUTH TWICE DAILY 09/02/24   Alphonsa [name], MD  zinc gluconate 50 MG tablet Take 50 mg by mouth daily.    [provider]    Physical Exam: Vitals:   10/16/24 1048 10/16/24 1200 10/16/24 1415 10/16/24 1608  BP: (!) 134/43 (!) 118/54 (!) 129/58 137/62  Pulse: (!) 111 97 99 95  Resp: 16  18   Temp: 97.9 F (36.6 C)   98.6 F (37 C)  TempSrc: Oral   Oral  SpO2: 98% 91% 93% 99%  Weight: 67.9 kg   67.7 kg  Height: 5' 3 (1.6 m)       Constitutional: NAD, calm, comfortable Vitals:   10/16/24 1048 10/16/24 1200 10/16/24 1415 10/16/24 1608  BP: (!) 134/43 (!) 118/54 (!) 129/58 137/62  Pulse: (!) 111 97 99 95  Resp: 16  18   Temp: 97.9 F (36.6 C)   98.6 F (37 C)  TempSrc: Oral   Oral  SpO2: 98% 91% 93% 99%  Weight: 67.9 kg   67.7 kg  Height: 5' 3 (1.6 m)      Eyes: PERRL, lids and conjunctivae normal ENMT: Mucous membranes are moist. Posterior pharynx clear of any exudate or lesions. Neck: normal,  supple, no masses, no thyromegaly Respiratory: bilateral decreased breath sounds at bases, no wheezing, no crackles. Normal respiratory effort. No accessory muscle use.  Cardiovascular: S1 S2 positive, rate controlled. No extremity edema. 2+ pedal pulses.  Abdomen: no tenderness, no masses palpated. No hepatosplenomegaly. Bowel sounds positive.  Musculoskeletal: no clubbing / cyanosis. No joint deformity upper and lower extremities.  Skin: no rashes, lesions, ulcers. No induration Neurologic: CN 2-12 grossly intact. Moving extremities. No focal neurologic deficits.  Psychiatric: Normal judgment and insight. Alert and oriented x 3. Normal mood.    Labs on Admission: I have personally reviewed following labs and imaging studies  CBC: Recent Labs  Lab 10/10/24 0853 10/16/24 1112  WBC 10.2 14.6*  NEUTROABS 7.6* 13.5*  HGB 14.1 12.0  HCT 43.3 35.8*  MCV 97 93.0  PLT 289 152   Basic Metabolic Panel: Recent Labs  Lab 10/10/24 0853 10/16/24 1112 10/16/24 1451  NA 141 137  --   K 3.3* 2.7* 2.9*  CL 99 99  --   CO2 24 24  --   GLUCOSE 218* 291*  --   BUN 9 10  --   CREATININE 0.55* 0.60  --   CALCIUM  10.2 9.2  --   MG 1.8  --   --    GFR: Estimated Creatinine Clearance: 51.8 mL/min (by C-G formula based on SCr of 0.6 mg/dL). Liver Function Tests: Recent Labs  Lab 10/10/24 0853 10/16/24 1112  AST 15 26  ALT 13 15  ALKPHOS 65 78  BILITOT 0.7 0.9  PROT 6.8 6.0*  ALBUMIN 4.4 3.6   Recent Labs  Lab 10/10/24 0853 10/16/24 1112  LIPASE 48 24   No results for input(s): AMMONIA in the last 168 hours. Coagulation Profile: No results for input(s): INR, PROTIME in the last 168 hours. Cardiac Enzymes: No results for input(s): CKTOTAL, CKMB, CKMBINDEX, TROPONINI in the last 168 hours. BNP (last 3 results) No results for input(s): PROBNP in the last 8760 hours. HbA1C: No results for input(s): HGBA1C in the last 72 hours. CBG: No results for  input(s):  GLUCAP in the last 168 hours. Lipid Profile: No results for input(s): CHOL, HDL, LDLCALC, TRIG, CHOLHDL, LDLDIRECT in the last 72 hours. Thyroid Function Tests: No results for input(s): TSH, T4TOTAL, FREET4, T3FREE, THYROIDAB in the last 72 hours. Anemia Panel: No results for input(s): VITAMINB12, FOLATE, FERRITIN, TIBC, IRON, RETICCTPCT in the last 72 hours. Urine analysis:    Component Value Date/Time   COLORURINE YELLOW 10/16/2024 1130   APPEARANCEUR TURBID (A) 10/16/2024 1130   LABSPEC 1.021 10/16/2024 1130   PHURINE 5.0 10/16/2024 1130   GLUCOSEU >=500 (A) 10/16/2024 1130   HGBUR LARGE (A) 10/16/2024 1130   BILIRUBINUR NEGATIVE 10/16/2024 1130   BILIRUBINUR negative 09/20/2021 1559   KETONESUR 5 (A) 10/16/2024 1130   PROTEINUR >=300 (A) 10/16/2024 1130   UROBILINOGEN 0.2 09/20/2021 1559   NITRITE NEGATIVE 10/16/2024 1130   LEUKOCYTESUR MODERATE (A) 10/16/2024 1130    Radiological Exams on Admission: CT ABDOMEN PELVIS W CONTRAST Result Date: 10/16/2024 CLINICAL DATA:  Abdominal pain. EXAM: CT ABDOMEN AND PELVIS WITH CONTRAST TECHNIQUE: Multidetector CT imaging of the abdomen and pelvis was performed using the standard protocol following bolus administration of intravenous contrast. RADIATION DOSE REDUCTION: This exam was performed according to the departmental dose-optimization program which includes automated exposure control, adjustment of the mA and/or kV according to patient size and/or use of iterative reconstruction technique. CONTRAST:  OMNIPAQUE  IOHEXOL  300 MG/ML  SOLN COMPARISON:  CT abdomen pelvis dated 09/25/2024. FINDINGS: Lower chest: The visualized lung bases are clear. No intra-abdominal free air or free fluid. Hepatobiliary: There is slight irregularity of the liver contour suspicious for changes of cirrhosis. Clinical correlation is recommended. There is biliary ductal dilatation, post cholecystectomy. No retained calcified  stone noted in the central CBD. Pancreas: Unremarkable. No pancreatic ductal dilatation or surrounding inflammatory changes. Spleen: Small hypoenhancing focus in the lateral spleen may represent an area of developing infarct. Adrenals/Urinary Tract: The adrenal glands are unremarkable. Left renal upper pole cyst. There is a 5 mm stone in the left renal pelvis. Two adjacent stones in the inferior pole of the left kidney measure up to 6 mm and a combined length of approximately 8 mm. There is mild left hydronephrosis. There is heterogeneous enhancement of the left renal parenchyma with urothelial enhancement consistent with left-sided pyelonephritis. Correlation with urinalysis recommended. No drainable fluid collection or abscess. There is left perinephric stranding. The visualized ureters appear unremarkable. Air within the urinary bladder likely introduced by recent instrumentation. Stomach/Bowel: There is no bowel obstruction or active inflammation. The appendix is normal. Vascular/Lymphatic: Mild aortoiliac atherosclerotic disease. The IVC is unremarkable. No portal venous gas. Retroperitoneal and left para-aortic adenopathy, reactive. Reproductive: Hysterectomy.  No suspicious adnexal masses. Other: None Musculoskeletal: Osteopenia with extensive degenerative changes of the spine. Multilevel lumbar spine disc spacer and posterior fusion. No acute osseous pathology. IMPRESSION: 1. Left-sided pyelonephritis. Correlation with urinalysis recommended. No drainable fluid collection or abscess. 2. Left renal pelvis and inferior pole calculi with mild left hydronephrosis. 3. No bowel obstruction. Normal appendix. 4.  Aortic Atherosclerosis (ICD10-I70.0). Electronically Signed   By: Vanetta Chou M.D.   On: 10/16/2024 14:05     Assessment/Plan  Acute pyelonephritis: Met sepsis criteria with leukocytosis, tachycardia.  Lactic acid was however within normal range. Will continue IV ceftriaxone, follow urine and  blood culture Supportive care IV fluid will be continued  Hypokalemia: Potassium 2.7, likely from poor oral intake nausea and vomiting Will replace IV plus p.o. if tolerating Repeat labs in the a.m.  Hypertension/hyperlipidemia -Resume amlodipine , hold valsartan   Resume sertraline .  Xanax   Mellitus: Hold glipizide .  Sliding scale insulin  coverage      DVT prophylaxis: lovenox  Code Status: full  Family Communication: family at the bedside  Disposition Plan: home  Consults called: none Admission status:  inpt , needs IV antibiotics, following cultures and monitoring of labs and electrolyte replacement  Severity of Illness: The appropriate patient status for this patient is INPATIENT. Inpatient status is judged to be reasonable and necessary in order to provide the required intensity of service to ensure the patient's safety. The patient's presenting symptoms, physical exam findings, and initial radiographic and laboratory data in the context of their chronic comorbidities is felt to place them at high risk for further clinical deterioration. Furthermore, it is not anticipated that the patient will be medically stable for discharge from the hospital within 2 midnights of admission.   * I certify that at the point of admission it is my clinical judgment that the patient will require inpatient hospital care spanning beyond 2 midnights from the point of admission due to high intensity of service, high risk for further deterioration and high frequency of surveillance required.DEWAINE Derryl Duval MD [facility]  10/16/2024, 4:55 PM

## 2024-10-16 NOTE — TOC CM/SW Note (Signed)
 Transition of Care [facility]) CM/SW Note    Transition of Care [facility]) - Inpatient Brief Assessment   Patient Details  Name: Christie [name] MRN: [id number] Date of Birth: Jun 29, 1946  Transition of Care [facility]) CM/SW Contact:    Noreen KATHEE Cleotilde ISRAEL Phone Number: 10/16/2024, 2:48 PM   Clinical Narrative:  Inpatient Care Management (ICM) has reviewed patient and no other ICM needs have been identified at this time. We will continue to monitor patient advancement through interdisciplinary progression rounds. If new patient transition needs arise, please place a ICM consult.  Transition of Care Asessment: Insurance and Status: Insurance coverage has been reviewed Patient has primary care physician: Yes Home environment has been reviewed: Single Family Home Prior level of function:: Independent Prior/Current Home Services: No current home services Social Drivers of Health Review: SDOH reviewed no interventions necessary Readmission risk has been reviewed: Yes Transition of care needs: transition of care needs identified, TOC will continue to follow

## 2024-10-16 NOTE — ED Triage Notes (Signed)
 Pt arrived via POV c/o feeling dehydrated and having abdominal pain. Pt reports having elevated blood sugar and elevated HR at home this morning. Pt reports recently needing IV fluids and potassium.

## 2024-10-16 NOTE — Telephone Encounter (Signed)
 FYI Only or Action Required?: FYI only for provider: UC or ED advised.  Patient was last seen in primary care on 10/02/2024 by Alphonsa [name], MD.  Called Nurse Triage reporting Diarrhea, Nausea, Tachycardia, Hyperglycemia, and Chills.  Symptoms of tachycardia, hyperglycemia and chills/feverish began today. Patient has been having GI symptoms (nausea, abdominal discomfort and diarrhea for 2-3 months).  Interventions attempted: Rest, hydration, or home remedies.  Symptoms are: felt feverish (temporal temp: 99.3 this morning), chills, tachycardia (HR 120), hyperglycemia (BG 258 this morning), nausea, diarrhea, abdominal discomfort gradually worsening.  Triage Disposition: See HCP Within 4 Hours (Or PCP Triage)  Patient/caregiver understands and will follow disposition?: Yes          Copied from CRM #[id number]. Topic: Clinical - Red Word Triage >> Oct 16, 2024  9:41 AM Mia F wrote: Red Word that prompted transfer to Nurse Triage: Pt has symptoms of a fever but reading 99.3, sugar level is up to 258, heart rate is elevated at 120, bp was 120/50+, slightly nauseated, and some stomach discomfort, and had some diarreah. Started last night and seems to be progressing. Reason for Disposition  Age > 60 years  (Exception: Brief heartbeat symptoms that went away and now feels well.)  Answer Assessment - Initial Assessment Questions Daughter, Damien, with patient for triage. She is agreeable to call urgent care to see if they have fluids. If not, RN advised patient got to ED for level of care due to tachycardia, concern for dehydration and recent hypokalemia with UTI. Daughter is agreeable.   1. DESCRIPTION: Please describe your heart rate or heartbeat that you are having (e.g., fast/slow, regular/irregular, skipped or extra beats, palpitations)     Fast.  2. ONSET: When did it start? (e.g., minutes, hours, days)      This morning.  3. DURATION: How long does it last (e.g., seconds,  minutes, hours)     Still elevated, since this morning when daughter checked 20 min prior.  4. PATTERN Does it come and go, or has it been constant since it started?  Does it get worse with exertion?   Are you feeling it now?     Constant..  5. TAP: Using your hand, can you tap out what you are feeling on a chair or table in front of you, so that I can hear? Note: Not all patients can do this.       N/A.  6. HEART RATE: Can you tell me your heart rate? How many beats in 15 seconds?  Note: Not all patients can do this.       120.  7. RECURRENT SYMPTOM: Have you ever had this before? If Yes, ask: When was the last time? and What happened that time?      No.  8. CAUSE: What do you think is causing the palpitations?     Infection or dehydration. Daughter is concerned the patient's UTI has returned. She is not sure how much fluids the patient has been drinking. She thinks there could be a little dehydrated. Daughter concerned for weakness, she states the patient is able to get up by herself and go to the bathroom.  9. CARDIAC HISTORY: Do you have any history of heart disease? (e.g., heart attack, angina, bypass surgery, angioplasty, arrhythmia)      HTN, mild aortic stenosis.  10. OTHER SYMPTOMS: Do you have any other symptoms? (e.g., dizziness, chest pain, sweating, difficulty breathing)       Temperature this morning 99.3 taken  with temporal thermometer, chills. Nausea, abdominal discomfort and diarrhea x 2-3 months intermittent. Blood sugar has been elevated, reading 258 this morning. Denies chest pain, palpitations, vomiting,  Protocols used: Heart Rate and Heartbeat Questions-A-AH

## 2024-10-16 NOTE — Sepsis Progress Note (Signed)
 eLink is following this Code Sepsis.

## 2024-10-17 DIAGNOSIS — N12 Tubulo-interstitial nephritis, not specified as acute or chronic: Secondary | ICD-10-CM | POA: Diagnosis not present

## 2024-10-17 LAB — BLOOD CULTURE ID PANEL (REFLEXED) - BCID2

## 2024-10-17 LAB — GLUCOSE, CAPILLARY
Glucose-Capillary: 209 mg/dL — ABNORMAL HIGH (ref 70–99)
Glucose-Capillary: 266 mg/dL — ABNORMAL HIGH (ref 70–99)
Glucose-Capillary: 243 mg/dL — ABNORMAL HIGH (ref 70–99)
Glucose-Capillary: 243 mg/dL — ABNORMAL HIGH (ref 70–99)

## 2024-10-17 LAB — CBC
HCT: 31.3 % — ABNORMAL LOW (ref 36.0–46.0)
Hemoglobin: 10.5 g/dL — ABNORMAL LOW (ref 12.0–15.0)
MCH: 31.2 pg (ref 26.0–34.0)
MCHC: 33.5 g/dL (ref 30.0–36.0)
MCV: 92.9 fL (ref 80.0–100.0)
Platelets: 122 K/uL — ABNORMAL LOW (ref 150–400)
RBC: 3.37 MIL/uL — ABNORMAL LOW (ref 3.87–5.11)
RDW: 14.9 % (ref 11.5–15.5)
WBC: 12.8 K/uL — ABNORMAL HIGH (ref 4.0–10.5)
nRBC: 0 % (ref 0.0–0.2)

## 2024-10-17 LAB — BASIC METABOLIC PANEL WITH GFR
Anion gap: 10 (ref 5–15)
BUN: 9 mg/dL (ref 8–23)
CO2: 26 mmol/L (ref 22–32)
Calcium: 8.4 mg/dL — ABNORMAL LOW (ref 8.9–10.3)
Chloride: 102 mmol/L (ref 98–111)
Creatinine, Ser: 0.36 mg/dL — ABNORMAL LOW (ref 0.44–1.00)
GFR, Estimated: >60 mL/min (ref >60)
Glucose, Bld: 227 mg/dL — ABNORMAL HIGH (ref 70–99)
Potassium: 3.1 mmol/L — ABNORMAL LOW (ref 3.5–5.1)
Sodium: 137 mmol/L (ref 135–145)

## 2024-10-17 MED ORDER — MELATONIN 3 MG PO TABS
6.0000 mg | ORAL_TABLET | Freq: Every evening | ORAL | Status: DC | PRN
  Administered 2024-10-17: 6 mg via ORAL
  Filled 2024-10-17: qty 2

## 2024-10-17 MED ORDER — SODIUM CHLORIDE 0.9 % IV SOLN
2.0000 g | INTRAVENOUS | Status: DC
  Administered 2024-10-18 – 2024-10-19 (×2): 2 g via INTRAVENOUS
  Filled 2024-10-17 (×2): qty 20

## 2024-10-17 NOTE — Inpatient Diabetes Management (Signed)
 Inpatient Diabetes Program Recommendations  AACE/ADA: New Consensus Statement on Inpatient Glycemic Control   Target Ranges:  Prepandial:   less than 140 mg/dL      Peak postprandial:   less than 180 mg/dL (1-2 hours)      Critically ill patients:  140 - 180 mg/dL    Latest Reference Range & Units 10/17/24 07:58 10/17/24 11:04  Glucose-Capillary 70 - 99 mg/dL 790 (H) 733 (H)   Review of Glycemic Control  Diabetes history: DM2 Outpatient Diabetes medications: Glipizide  10 mg BID Current orders for Inpatient glycemic control: Novolog  0-15 units TID with meals  Inpatient Diabetes Program Recommendations:    Insulin : Please consider ordering [place] 6 units Q24H.  Thanks, Earnie Gainer, RN, MSN, CDCES Diabetes Coordinator Inpatient Diabetes Program [phone] (Team Pager from 8am to 5pm)

## 2024-10-17 NOTE — Progress Notes (Signed)
 PROGRESS NOTE    Christie [name]  FMW:[id number] DOB: [birth year] DOA: 10/16/2024 PCP: Alphonsa [name], MD   Brief Narrative:   Christie [name] is a 78 y.o. female with medical history significant of diabetes mellitus, heart murmur, hypertension, hyperlipidemia, abdominal pain who presents to the ER with complaints of generalized abdominal pain, chills, nausea and vomiting.  Patient has been feeling sick for the past couple of weeks with generalized abdominal pain.  She also reports of having chills.  No definite fever.  She was seen by GI on 10/11/2024.  She also says she recently got a course of antibiotics for UTI within the past 3 weeks.   In the ER, vital signs are stable.  She was mildly tachycardic which improved with fluid.  UA was consistent with urinary tract infection.  CT scan shows left-sided pyelonephritis, mild hydronephrosis with calculus in the renal pelvis.  Oratory work is also notable for significant hypokalemia with potassium 2.7.  Patient is admitted for acute pyelonephritis./Hypokalemia.   Assessment & Plan:   Principal Problem:   Pyelonephritis   Acute pyelonephritis: Met sepsis criteria with leukocytosis, tachycardia.  Lactic acid was however within normal range. Will continue IV ceftriaxone, urine and blood culture no growth thus far Supportive care Received IV hydration, will discontinue IV fluid today.  Hypokalemia: Potassium 2.7 on admission is still low at 3.1 despite IV and p.o. replacement, likely from poor oral intake nausea and vomiting Will replace  p.o. today Repeat labs in the a.m.   Hypertension/hyperlipidemia -Resume amlodipine , hold valsartan    Resume sertraline .  Xanax    Diabetes Mellitus: Hold glipizide .  Sliding scale insulin  coverage     DVT prophylaxis: Lovenox Code Status: Full code Family Communication: daughter at the bedside Disposition Plan: Home Status is: Inpatient Remains inpatient appropriate because: continue IV abx,  electrolyte abn, lab monitoring, follow cultures   Subjective:   Objective: Vitals:   10/16/24 1753 10/16/24 2002 10/17/24 0002 10/17/24 0408  BP: 120/60 121/77 (!) 126/57 (!) 113/53  Pulse:  93 94 90  Resp:  18 18 18   Temp:  98.3 F (36.8 C) 98.3 F (36.8 C) 98.6 F (37 C)  TempSrc:  Oral Oral Oral  SpO2:  96% 96% 93%  Weight:      Height:        Intake/Output Summary (Last 24 hours) at 10/17/2024 0819 Last data filed at 10/17/2024 9360 Gross per 24 hour  Intake 1710 ml  Output --  Net 1710 ml   Filed Weights   10/16/24 1048 10/16/24 1608  Weight: 67.9 kg 67.7 kg    Examination:  General exam: Appears calm and comfortable  Respiratory system: Bilateral decreased breath sounds at bases Cardiovascular system: S1 & S2 heard, Rate controlled Gastrointestinal system: Abdomen is nondistended, soft and nontender. Normal bowel sounds heard. Extremities: No cyanosis, clubbing, edema  Central nervous system: Alert and oriented. No focal neurological deficits. Moving extremities Skin: No rashes, lesions or ulcers Psychiatry: Judgement and insight appear normal. Mood & affect appropriate.     Data Reviewed: I have personally reviewed following labs and imaging studies  CBC: Recent Labs  Lab 10/10/24 0853 10/16/24 1112 10/17/24 0434  WBC 10.2 14.6* 12.8*  NEUTROABS 7.6* 13.5*  --   HGB 14.1 12.0 10.5*  HCT 43.3 35.8* 31.3*  MCV 97 93.0 92.9  PLT 289 152 122*   Basic Metabolic Panel: Recent Labs  Lab 10/10/24 0853 10/16/24 1112 10/16/24 1451 10/17/24 0434  NA 141 137  --  137  K 3.3* 2.7* 2.9* 3.1*  CL 99 99  --  102  CO2 24 24  --  26  GLUCOSE 218* 291*  --  227*  BUN 9 10  --  9  CREATININE 0.55* 0.60  --  0.36*  CALCIUM  10.2 9.2  --  8.4*  MG 1.8  --   --   --    GFR: Estimated Creatinine Clearance: 51.8 mL/min (A) (by C-G formula based on SCr of 0.36 mg/dL (L)). Liver Function Tests: Recent Labs  Lab 10/10/24 0853 10/16/24 1112  AST 15 26   ALT 13 15  ALKPHOS 65 78  BILITOT 0.7 0.9  PROT 6.8 6.0*  ALBUMIN 4.4 3.6   Recent Labs  Lab 10/10/24 0853 10/16/24 1112  LIPASE 48 24   No results for input(s): AMMONIA in the last 168 hours. Coagulation Profile: No results for input(s): INR, PROTIME in the last 168 hours. Cardiac Enzymes: No results for input(s): CKTOTAL, CKMB, CKMBINDEX, TROPONINI in the last 168 hours. BNP (last 3 results) No results for input(s): PROBNP in the last 8760 hours. HbA1C: No results for input(s): HGBA1C in the last 72 hours. CBG: Recent Labs  Lab 10/17/24 0758  GLUCAP 209*   Lipid Profile: No results for input(s): CHOL, HDL, LDLCALC, TRIG, CHOLHDL, LDLDIRECT in the last 72 hours. Thyroid Function Tests: No results for input(s): TSH, T4TOTAL, FREET4, T3FREE, THYROIDAB in the last 72 hours. Anemia Panel: No results for input(s): VITAMINB12, FOLATE, FERRITIN, TIBC, IRON, RETICCTPCT in the last 72 hours. Sepsis Labs: Recent Labs  Lab 10/16/24 1451  LATICACIDVEN 1.5    Recent Results (from the past 240 hours)  Blood culture (routine x 2)     Status: None (Preliminary result)   Collection Time: 10/16/24  2:51 PM   Specimen: BLOOD  Result Value Ref Range Status   Specimen Description BLOOD BLOOD LEFT HAND  Final   Special Requests   Final    BOTTLES DRAWN AEROBIC AND ANAEROBIC Blood Culture results may not be optimal due to an inadequate volume of blood received in culture bottles   Culture   Final    NO GROWTH < 24 HOURS Performed at [facility], [address] [place], [place] [address]    Report Status PENDING  Incomplete  Blood culture (routine x 2)     Status: None (Preliminary result)   Collection Time: 10/16/24  2:51 PM   Specimen: BLOOD  Result Value Ref Range Status   Specimen Description BLOOD LEFT ANTECUBITAL  Final   Special Requests   Final    BOTTLES DRAWN AEROBIC ONLY Blood Culture results may not be optimal due  to an inadequate volume of blood received in culture bottles   Culture   Final    NO GROWTH < 24 HOURS Performed at [facility], [address] [place], [place] [address]    Report Status PENDING  Incomplete         Radiology Studies: CT ABDOMEN PELVIS W CONTRAST Result Date: 10/16/2024 CLINICAL DATA:  Abdominal pain. EXAM: CT ABDOMEN AND PELVIS WITH CONTRAST TECHNIQUE: Multidetector CT imaging of the abdomen and pelvis was performed using the standard protocol following bolus administration of intravenous contrast. RADIATION DOSE REDUCTION: This exam was performed according to the departmental dose-optimization program which includes automated exposure control, adjustment of the mA and/or kV according to patient size and/or use of iterative reconstruction technique. CONTRAST:  OMNIPAQUE  IOHEXOL  300 MG/ML  SOLN COMPARISON:  CT abdomen pelvis dated  09/25/2024. FINDINGS: Lower chest: The visualized lung bases are clear. No intra-abdominal free air or free fluid. Hepatobiliary: There is slight irregularity of the liver contour suspicious for changes of cirrhosis. Clinical correlation is recommended. There is biliary ductal dilatation, post cholecystectomy. No retained calcified stone noted in the central CBD. Pancreas: Unremarkable. No pancreatic ductal dilatation or surrounding inflammatory changes. Spleen: Small hypoenhancing focus in the lateral spleen may represent an area of developing infarct. Adrenals/Urinary Tract: The adrenal glands are unremarkable. Left renal upper pole cyst. There is a 5 mm stone in the left renal pelvis. Two adjacent stones in the inferior pole of the left kidney measure up to 6 mm and a combined length of approximately 8 mm. There is mild left hydronephrosis. There is heterogeneous enhancement of the left renal parenchyma with urothelial enhancement consistent with left-sided pyelonephritis. Correlation with urinalysis recommended. No drainable fluid collection or  abscess. There is left perinephric stranding. The visualized ureters appear unremarkable. Air within the urinary bladder likely introduced by recent instrumentation. Stomach/Bowel: There is no bowel obstruction or active inflammation. The appendix is normal. Vascular/Lymphatic: Mild aortoiliac atherosclerotic disease. The IVC is unremarkable. No portal venous gas. Retroperitoneal and left para-aortic adenopathy, reactive. Reproductive: Hysterectomy.  No suspicious adnexal masses. Other: None Musculoskeletal: Osteopenia with extensive degenerative changes of the spine. Multilevel lumbar spine disc spacer and posterior fusion. No acute osseous pathology. IMPRESSION: 1. Left-sided pyelonephritis. Correlation with urinalysis recommended. No drainable fluid collection or abscess. 2. Left renal pelvis and inferior pole calculi with mild left hydronephrosis. 3. No bowel obstruction. Normal appendix. 4.  Aortic Atherosclerosis (ICD10-I70.0). Electronically Signed   By: Vanetta Chou M.D.   On: 10/16/2024 14:05        Scheduled Meds:  ALPRAZolam   0.5 mg Oral QHS   amLODipine   10 mg Oral Daily   vitamin C  1,000 mg Oral Daily   cholecalciferol  1,000 Units Oral Daily   enoxaparin (LOVENOX) injection  40 mg Subcutaneous Q24H   insulin  aspart  0-15 Units Subcutaneous TID WC   potassium chloride   20 mEq Oral BID   senna-docusate  1 tablet Oral QHS   sertraline   50 mg Oral Daily   Continuous Infusions:  cefTRIAXone (ROCEPHIN)  IV     lactated ringers  75 mL/hr at 10/16/24 2305          Christie Bittinger, MD [facility] 10/17/2024, 8:19 AM

## 2024-10-17 NOTE — Plan of Care (Signed)

## 2024-10-17 NOTE — Progress Notes (Signed)
 Mobility Specialist Progress Note:    10/17/24 1005  Mobility  Activity Ambulated with assistance  Level of Assistance Modified independent, requires aide device or extra time  Assistive Device None  Distance Ambulated (ft) 180 ft  Range of Motion/Exercises Active;All extremities  Activity Response Tolerated well  Mobility Referral Yes  Mobility visit 1 Mobility  Mobility Specialist Start Time (ACUTE ONLY) 1005  Mobility Specialist Stop Time (ACUTE ONLY) 1025  Mobility Specialist Time Calculation (min) (ACUTE ONLY) 20 min   Pt received in bed, family in room. Agreeable to mobility, ModI to stand and ambulate with no AD. Tolerated well,asx throughout. Returned supine, all needs met.  Christie [name] Mobility Specialist Please contact via Special Educational Needs Teacher or  Rehab office at [phone]

## 2024-10-17 NOTE — Progress Notes (Signed)
 Received call from lab about Gram negative rods from blood culture paged Dr Mcarthur on amion

## 2024-10-18 ENCOUNTER — Encounter: Payer: Self-pay | Admitting: Family Medicine

## 2024-10-18 DIAGNOSIS — N12 Tubulo-interstitial nephritis, not specified as acute or chronic: Secondary | ICD-10-CM | POA: Diagnosis not present

## 2024-10-18 LAB — GLUCOSE, CAPILLARY
Glucose-Capillary: 200 mg/dL — ABNORMAL HIGH (ref 70–99)
Glucose-Capillary: 237 mg/dL — ABNORMAL HIGH (ref 70–99)
Glucose-Capillary: 258 mg/dL — ABNORMAL HIGH (ref 70–99)
Glucose-Capillary: 254 mg/dL — ABNORMAL HIGH (ref 70–99)

## 2024-10-18 LAB — BASIC METABOLIC PANEL WITH GFR
Anion gap: 11 (ref 5–15)
BUN: 8 mg/dL (ref 8–23)
CO2: 28 mmol/L (ref 22–32)
Calcium: 8.9 mg/dL (ref 8.9–10.3)
Chloride: 98 mmol/L (ref 98–111)
Creatinine, Ser: 0.46 mg/dL (ref 0.44–1.00)
GFR, Estimated: >60 mL/min (ref >60)
Glucose, Bld: 284 mg/dL — ABNORMAL HIGH (ref 70–99)
Potassium: 3.3 mmol/L — ABNORMAL LOW (ref 3.5–5.1)
Sodium: 137 mmol/L (ref 135–145)

## 2024-10-18 LAB — URINE CULTURE: Culture: >=100000 — AB

## 2024-10-18 MED ORDER — DICYCLOMINE HCL 10 MG PO CAPS
10.0000 mg | ORAL_CAPSULE | Freq: Three times a day (TID) | ORAL | Status: DC
  Administered 2024-10-18 – 2024-10-19 (×3): 10 mg via ORAL
  Filled 2024-10-18 (×3): qty 1

## 2024-10-18 MED ORDER — IRBESARTAN 150 MG PO TABS
150.0000 mg | ORAL_TABLET | Freq: Every day | ORAL | Status: DC
  Administered 2024-10-18 – 2024-10-19 (×2): 150 mg via ORAL
  Filled 2024-10-18 (×2): qty 1

## 2024-10-18 MED ORDER — INSULIN GLARGINE-YFGN 100 UNIT/ML ~~LOC~~ SOLN
10.0000 [IU] | Freq: Every day | SUBCUTANEOUS | Status: DC
  Administered 2024-10-19: 10 [IU] via SUBCUTANEOUS
  Filled 2024-10-18 (×3): qty 0.1

## 2024-10-18 MED ORDER — ONDANSETRON HCL 4 MG/2ML IJ SOLN: 4.0000 mg | Freq: Four times a day (QID) | INTRAMUSCULAR | Status: DC | PRN

## 2024-10-18 MED ORDER — PANTOPRAZOLE SODIUM 40 MG PO TBEC
40.0000 mg | DELAYED_RELEASE_TABLET | Freq: Every day | ORAL | Status: DC
  Administered 2024-10-18 – 2024-10-19 (×2): 40 mg via ORAL
  Filled 2024-10-18 (×2): qty 1

## 2024-10-18 MED ORDER — POTASSIUM CHLORIDE 20 MEQ PO PACK
40.0000 meq | PACK | Freq: Two times a day (BID) | ORAL | Status: DC
  Administered 2024-10-18 – 2024-10-19 (×2): 40 meq via ORAL
  Filled 2024-10-18 (×2): qty 2

## 2024-10-18 NOTE — Progress Notes (Signed)
 PROGRESS NOTE    Christie [name]  FMW:[id number] DOB: 08/18/1946 DOA: 10/16/2024 PCP: Alphonsa [name], MD   Brief Narrative:   Christie [name] is a 78 y.o. female with medical history significant of diabetes mellitus, heart murmur, hypertension, hyperlipidemia, abdominal pain who presents to the ER with complaints of generalized abdominal pain, chills, nausea and vomiting.  Patient has been feeling sick for the past couple of weeks with generalized abdominal pain.  She also reports of having chills.  No definite fever.  She was seen by GI on 10/11/2024.  She also says she recently got a course of antibiotics for UTI within the past 3 weeks.   In the ER, vital signs are stable.  She was mildly tachycardic which improved with fluid.  UA was consistent with urinary tract infection.  CT scan shows left-sided pyelonephritis, mild hydronephrosis with calculus in the renal pelvis.  Oratory work is also notable for significant hypokalemia with potassium 2.7.  Patient is admitted for acute pyelonephritis./Hypokalemia.   Assessment & Plan:   Principal Problem:   Pyelonephritis   Acute pyelonephritis: Met sepsis criteria with leukocytosis, tachycardia.  Lactic acid was however within normal range. Will continue IV ceftriaxone, urine and blood culture positive for E. coli, susceptibilities pending. Discussed potential discharge on oral antibiotics today however patient still has some abdominal discomfort and would want to stay for another 24 hours on IV antibiotics.  Hypokalemia: Potassium 2.7 on admission is still low at 3.3 despite IV and p.o. replacement, likely from poor oral intake nausea and vomiting Will replace  p.o. today Repeat labs in the a.m.   Hypertension/hyperlipidemia -Resume amlodipine , resume valsartan    Resume sertraline .  Xanax    Diabetes Mellitus: Hold glipizide .  Sliding scale insulin  coverage Add Lantus 10 unit.     DVT prophylaxis: Lovenox Code Status: Full code Family  Communication: daughter at the bedside Disposition Plan: Home Status is: Inpatient Remains inpatient appropriate because: continue IV abx, electrolyte abn, lab monitoring, follow cultures   Subjective:  Patient seen and examined at the bedside.  Denies any fever or chills.  Feels weak.  Has some abdominal discomfort.   Objective: Vitals:   10/17/24 2006 10/18/24 0300 10/18/24 0922 10/18/24 1235  BP: 120/64 (!) 142/69 (!) 141/59 (!) 145/68  Pulse: 86 81 100 100  Resp: 20 18  16   Temp: 99.1 F (37.3 C) 97.8 F (36.6 C)  98.5 F (36.9 C)  TempSrc: Oral Oral  Oral  SpO2: 96% 95%  94%  Weight:      Height:        Intake/Output Summary (Last 24 hours) at 10/18/2024 1347 Last data filed at 10/18/2024 0500 Gross per 24 hour  Intake 490 ml  Output --  Net 490 ml   Filed Weights   10/16/24 1048 10/16/24 1608  Weight: 67.9 kg 67.7 kg    Examination:  General exam: Appears calm and comfortable  Respiratory system: Bilateral decreased breath sounds at bases Cardiovascular system: S1 & S2 heard, Rate controlled Gastrointestinal system: Abdomen is nondistended, soft and nontender. Normal bowel sounds heard. Extremities: No cyanosis, clubbing, edema  Central nervous system: Alert and oriented. No focal neurological deficits. Moving extremities Skin: No rashes, lesions or ulcers Psychiatry: Judgement and insight appear normal. Mood & affect appropriate.     Data Reviewed: I have personally reviewed following labs and imaging studies  CBC: Recent Labs  Lab 10/16/24 1112 10/17/24 0434  WBC 14.6* 12.8*  NEUTROABS 13.5*  --   HGB  12.0 10.5*  HCT 35.8* 31.3*  MCV 93.0 92.9  PLT 152 122*   Basic Metabolic Panel: Recent Labs  Lab 10/16/24 1112 10/16/24 1451 10/17/24 0434 10/18/24 0930  NA 137  --  137 137  K 2.7* 2.9* 3.1* 3.3*  CL 99  --  102 98  CO2 24  --  26 28  GLUCOSE 291*  --  227* 284*  BUN 10  --  9 8  CREATININE 0.60  --  0.36* 0.46  CALCIUM  9.2  --   8.4* 8.9   GFR: Estimated Creatinine Clearance: 51.8 mL/min (by C-G formula based on SCr of 0.46 mg/dL). Liver Function Tests: Recent Labs  Lab 10/16/24 1112  AST 26  ALT 15  ALKPHOS 78  BILITOT 0.9  PROT 6.0*  ALBUMIN 3.6   Recent Labs  Lab 10/16/24 1112  LIPASE 24   No results for input(s): AMMONIA in the last 168 hours. Coagulation Profile: No results for input(s): INR, PROTIME in the last 168 hours. Cardiac Enzymes: No results for input(s): CKTOTAL, CKMB, CKMBINDEX, TROPONINI in the last 168 hours. BNP (last 3 results) No results for input(s): PROBNP in the last 8760 hours. HbA1C: No results for input(s): HGBA1C in the last 72 hours. CBG: Recent Labs  Lab 10/17/24 1104 10/17/24 1645 10/17/24 2001 10/18/24 0727 10/18/24 1125  GLUCAP 266* 243* 243* 200* 237*   Lipid Profile: No results for input(s): CHOL, HDL, LDLCALC, TRIG, CHOLHDL, LDLDIRECT in the last 72 hours. Thyroid Function Tests: No results for input(s): TSH, T4TOTAL, FREET4, T3FREE, THYROIDAB in the last 72 hours. Anemia Panel: No results for input(s): VITAMINB12, FOLATE, FERRITIN, TIBC, IRON, RETICCTPCT in the last 72 hours. Sepsis Labs: Recent Labs  Lab 10/16/24 1451  LATICACIDVEN 1.5    Recent Results (from the past 240 hours)  Urine Culture     Status: Abnormal   Collection Time: 10/16/24 11:30 AM   Specimen: Urine, Clean Catch  Result Value Ref Range Status   Specimen Description   Final    URINE, CLEAN CATCH Performed at [facility], [address], [place], [place] [address]    Special Requests   Final    NONE Performed at [facility], [address], [place], [place] [address]    Culture >=100,000 COLONIES/mL ESCHERICHIA COLI (A)  Final   Report Status 10/18/2024 FINAL  Final   Organism ID, Bacteria ESCHERICHIA COLI (A)  Final      Susceptibility   Escherichia coli - MIC*    AMPICILLIN 8 SENSITIVE Sensitive     CEFAZOLIN   (URINE) Value in next row Sensitive      <=1 SENSITIVEThis is a modified FDA-approved test that has been validated and its performance characteristics determined by the reporting laboratory.  This laboratory is certified under the Clinical Laboratory Improvement Amendments CLIA as qualified to perform high complexity clinical laboratory testing.    CEFEPIME Value in next row Sensitive      <=1 SENSITIVEThis is a modified FDA-approved test that has been validated and its performance characteristics determined by the reporting laboratory.  This laboratory is certified under the Clinical Laboratory Improvement Amendments CLIA as qualified to perform high complexity clinical laboratory testing.    ERTAPENEM Value in next row Sensitive      <=1 SENSITIVEThis is a modified FDA-approved test that has been validated and its performance characteristics determined by the reporting laboratory.  This laboratory is certified under the Clinical Laboratory Improvement Amendments CLIA as qualified to perform high complexity clinical laboratory  testing.    CEFTRIAXONE Value in next row Sensitive      <=1 SENSITIVEThis is a modified FDA-approved test that has been validated and its performance characteristics determined by the reporting laboratory.  This laboratory is certified under the Clinical Laboratory Improvement Amendments CLIA as qualified to perform high complexity clinical laboratory testing.    CIPROFLOXACIN Value in next row Sensitive      <=1 SENSITIVEThis is a modified FDA-approved test that has been validated and its performance characteristics determined by the reporting laboratory.  This laboratory is certified under the Clinical Laboratory Improvement Amendments CLIA as qualified to perform high complexity clinical laboratory testing.    GENTAMICIN Value in next row Sensitive      <=1 SENSITIVEThis is a modified FDA-approved test that has been validated and its performance characteristics determined by  the reporting laboratory.  This laboratory is certified under the Clinical Laboratory Improvement Amendments CLIA as qualified to perform high complexity clinical laboratory testing.    NITROFURANTOIN  Value in next row Sensitive      <=1 SENSITIVEThis is a modified FDA-approved test that has been validated and its performance characteristics determined by the reporting laboratory.  This laboratory is certified under the Clinical Laboratory Improvement Amendments CLIA as qualified to perform high complexity clinical laboratory testing.    TRIMETH/SULFA Value in next row Sensitive      <=1 SENSITIVEThis is a modified FDA-approved test that has been validated and its performance characteristics determined by the reporting laboratory.  This laboratory is certified under the Clinical Laboratory Improvement Amendments CLIA as qualified to perform high complexity clinical laboratory testing.    AMPICILLIN/SULBACTAM Value in next row Sensitive      <=1 SENSITIVEThis is a modified FDA-approved test that has been validated and its performance characteristics determined by the reporting laboratory.  This laboratory is certified under the Clinical Laboratory Improvement Amendments CLIA as qualified to perform high complexity clinical laboratory testing.    PIP/TAZO Value in next row Sensitive      <=4 SENSITIVEThis is a modified FDA-approved test that has been validated and its performance characteristics determined by the reporting laboratory.  This laboratory is certified under the Clinical Laboratory Improvement Amendments CLIA as qualified to perform high complexity clinical laboratory testing.    MEROPENEM Value in next row Sensitive      <=4 SENSITIVEThis is a modified FDA-approved test that has been validated and its performance characteristics determined by the reporting laboratory.  This laboratory is certified under the Clinical Laboratory Improvement Amendments CLIA as qualified to perform high complexity  clinical laboratory testing.    * >=100,000 COLONIES/mL ESCHERICHIA COLI  Blood culture (routine x 2)     Status: Abnormal (Preliminary result)   Collection Time: 10/16/24  2:51 PM   Specimen: BLOOD  Result Value Ref Range Status   Specimen Description   Final    BLOOD BLOOD LEFT HAND Performed at [facility], [address], [place], [place] [address]    Special Requests   Final    BOTTLES DRAWN AEROBIC AND ANAEROBIC Blood Culture results may not be optimal due to an inadequate volume of blood received in culture bottles Performed at [facility], [address], [place], [place] [address]    Culture  Setup Time   Final    ANAEROBIC BOTTLE ONLY GRAM NEGATIVE RODS Gram Stain Report Called to,Read Back By and Verified With: DOROTHYANN ISLAND @ 1628 ON 10/17/24 C VARNER CRITICAL RESULT CALLED TO, READ BACK BY AND VERIFIED WITH:  A SIPAT RN 10/17/2024 @ 2250 BY AB    Culture (A)  Final    ESCHERICHIA COLI SUSCEPTIBILITIES TO FOLLOW Performed at [facility], [address] [place], [place] [address]    Report Status PENDING  Incomplete  Blood culture (routine x 2)     Status: None (Preliminary result)   Collection Time: 10/16/24  2:51 PM   Specimen: BLOOD  Result Value Ref Range Status   Specimen Description BLOOD LEFT ANTECUBITAL  Final   Special Requests   Final    BOTTLES DRAWN AEROBIC ONLY Blood Culture results may not be optimal due to an inadequate volume of blood received in culture bottles   Culture   Final    NO GROWTH 2 DAYS Performed at [facility], [address], [place], [place] [address]    Report Status PENDING  Incomplete  Blood Culture ID Panel (Reflexed)     Status: Abnormal   Collection Time: 10/16/24  2:51 PM  Result Value Ref Range Status   Enterococcus faecalis NOT DETECTED NOT DETECTED Final   Enterococcus Faecium NOT DETECTED NOT DETECTED Final   Listeria monocytogenes NOT DETECTED NOT DETECTED Final   Staphylococcus species NOT DETECTED NOT  DETECTED Final   Staphylococcus aureus (BCID) NOT DETECTED NOT DETECTED Final   Staphylococcus epidermidis NOT DETECTED NOT DETECTED Final   Staphylococcus lugdunensis NOT DETECTED NOT DETECTED Final   Streptococcus species NOT DETECTED NOT DETECTED Final   Streptococcus agalactiae NOT DETECTED NOT DETECTED Final   Streptococcus pneumoniae NOT DETECTED NOT DETECTED Final   Streptococcus pyogenes NOT DETECTED NOT DETECTED Final   A.calcoaceticus-baumannii NOT DETECTED NOT DETECTED Final   Bacteroides fragilis NOT DETECTED NOT DETECTED Final   Enterobacterales DETECTED (A) NOT DETECTED Final    Comment: Enterobacterales represent a large order of gram negative bacteria, not a single organism. CRITICAL RESULT CALLED TO, READ BACK BY AND VERIFIED WITH: A SIPAT RN 10/17/2024 @ 2250 BY AB    Enterobacter cloacae complex NOT DETECTED NOT DETECTED Final   Escherichia coli DETECTED (A) NOT DETECTED Final    Comment: CRITICAL RESULT CALLED TO, READ BACK BY AND VERIFIED WITH: A SIPAT RN 10/17/2024 @ 2250 BY AB    Klebsiella aerogenes NOT DETECTED NOT DETECTED Final   Klebsiella oxytoca NOT DETECTED NOT DETECTED Final   Klebsiella pneumoniae NOT DETECTED NOT DETECTED Final   Proteus species NOT DETECTED NOT DETECTED Final   Salmonella species NOT DETECTED NOT DETECTED Final   Serratia marcescens NOT DETECTED NOT DETECTED Final   Haemophilus influenzae NOT DETECTED NOT DETECTED Final   Neisseria meningitidis NOT DETECTED NOT DETECTED Final   Pseudomonas aeruginosa NOT DETECTED NOT DETECTED Final   Stenotrophomonas maltophilia NOT DETECTED NOT DETECTED Final   Candida albicans NOT DETECTED NOT DETECTED Final   Candida auris NOT DETECTED NOT DETECTED Final   Candida glabrata NOT DETECTED NOT DETECTED Final   Candida krusei NOT DETECTED NOT DETECTED Final   Candida parapsilosis NOT DETECTED NOT DETECTED Final   Candida tropicalis NOT DETECTED NOT DETECTED Final   Cryptococcus neoformans/gattii  NOT DETECTED NOT DETECTED Final   CTX-M ESBL NOT DETECTED NOT DETECTED Final   Carbapenem resistance IMP NOT DETECTED NOT DETECTED Final   Carbapenem resistance KPC NOT DETECTED NOT DETECTED Final   Carbapenem resistance NDM NOT DETECTED NOT DETECTED Final   Carbapenem resist OXA 48 LIKE NOT DETECTED NOT DETECTED Final   Carbapenem resistance VIM NOT DETECTED NOT DETECTED Final    Comment: Performed at  [facility], [address] [place], [place] [address]         Radiology Studies: No results found.       Scheduled Meds:  ALPRAZolam   0.5 mg Oral QHS   amLODipine   10 mg Oral Daily   vitamin C  1,000 mg Oral Daily   cholecalciferol  1,000 Units Oral Daily   enoxaparin (LOVENOX) injection  40 mg Subcutaneous Q24H   insulin  aspart  0-15 Units Subcutaneous TID WC   insulin  glargine-yfgn  10 Units Subcutaneous Daily   potassium chloride   20 mEq Oral BID   senna-docusate  1 tablet Oral QHS   sertraline   50 mg Oral Daily   Continuous Infusions:  cefTRIAXone (ROCEPHIN)  IV 2 g (10/18/24 1337)          Derryl Duval, MD [facility] 10/18/2024, 1:47 PM

## 2024-10-18 NOTE — Plan of Care (Signed)
  Problem: Education: Goal: Knowledge of General Education information will improve Description: Including pain rating scale, medication(s)/side effects and non-pharmacologic comfort measures Outcome: Progressing   Problem: Health Behavior/Discharge Planning: Goal: Ability to manage health-related needs will improve Outcome: Progressing   Problem: Clinical Measurements: Goal: Ability to maintain clinical measurements within normal limits will improve Outcome: Progressing   Problem: Activity: Goal: Risk for activity intolerance will decrease Outcome: Progressing   Problem: Nutrition: Goal: Adequate nutrition will be maintained Outcome: Progressing   Problem: Coping: Goal: Level of anxiety will decrease Outcome: Progressing   Problem: Elimination: Goal: Will not experience complications related to bowel motility Outcome: Progressing Goal: Will not experience complications related to urinary retention Outcome: Progressing   Problem: Pain Managment: Goal: General experience of comfort will improve and/or be controlled Outcome: Progressing   Problem: Safety: Goal: Ability to remain free from injury will improve Outcome: Progressing   Problem: Skin Integrity: Goal: Risk for impaired skin integrity will decrease Outcome: Progressing   Problem: Education: Goal: Ability to describe self-care measures that may prevent or decrease complications (Diabetes Survival Skills Education) will improve Outcome: Progressing Goal: Individualized Educational Video(s) Outcome: Progressing   Problem: Coping: Goal: Ability to adjust to condition or change in health will improve Outcome: Progressing   Problem: Fluid Volume: Goal: Ability to maintain a balanced intake and output will improve Outcome: Progressing   Problem: Health Behavior/Discharge Planning: Goal: Ability to identify and utilize available resources and services will improve Outcome: Progressing Goal: Ability to manage  health-related needs will improve Outcome: Progressing   Problem: Metabolic: Goal: Ability to maintain appropriate glucose levels will improve Outcome: Progressing   Problem: Nutritional: Goal: Maintenance of adequate nutrition will improve Outcome: Progressing Goal: Progress toward achieving an optimal weight will improve Outcome: Progressing   Problem: Skin Integrity: Goal: Risk for impaired skin integrity will decrease Outcome: Progressing   Problem: Tissue Perfusion: Goal: Adequacy of tissue perfusion will improve Outcome: Progressing

## 2024-10-18 NOTE — Progress Notes (Signed)
 Mobility Specialist Progress Note:    10/18/24 0917  Mobility  Activity Ambulated with assistance  Level of Assistance Modified independent, requires aide device or extra time  Assistive Device None  Distance Ambulated (ft) 180 ft  Range of Motion/Exercises Active;All extremities  Activity Response Tolerated well  Mobility Referral Yes  Mobility visit 1 Mobility  Mobility Specialist Start Time (ACUTE ONLY) [phone]  Mobility Specialist Stop Time (ACUTE ONLY) [id number]  Mobility Specialist Time Calculation (min) (ACUTE ONLY) 20 min   Pt received standing at doorway. ModI to ambulate with no AD. Tolerated well,asx throughout. Returned to room, all needs met.  Gwenyth Dingee Mobility Specialist Please contact via Special Educational Needs Teacher or  Rehab office at [phone]

## 2024-10-18 NOTE — Significant Event (Signed)
       CROSS COVER NOTE  NAME: Christie [name] MRN: [id number] DOB : 1946/01/03 ATTENDING PHYSICIAN: Mcarthur Pick, MD    Date of Service   10/18/2024   HPI/Events of Note   [facility] Cross Cover for [employer] received from nurse via secure chat Hello! Just to notify you about the blood culture result of this pt, Christie [name] of room 3026.SABRAShe is positive for enterobacterales and E Coli. She was admitted for pyelonephritis since 10/29.2025. Thank you   HPI: labs and meds reviewed  Interventions   Assessment/Plan: Blood pressure 120/64, pulse 86, temperature 99.1 F (37.3 C), temperature source Oral, resp. rate 20, height 5' 3 (1.6 m), weight 67.7 kg, SpO2 96%.   Currently on ceftriaxone 1 gm IV every 24 hours for E Coli UTI Dose of ceftriaxone increased to 2 gm IV every 24 h as indicated for e coli bacteremia        Erminio LITTIE Cone NP [facility] Cross Cover 7pm-7am - check amion for availability Pager [phone]

## 2024-10-18 NOTE — Care Management Important Message (Signed)
 Important Message  Patient Details  Name: Christie [name] MRN: [id number] Date of Birth: 1946-04-03   Important Message Given:  Yes - Medicare IM     Jalei Shibley L Zaire Vanbuskirk 10/18/2024, 4:26 PM

## 2024-10-19 DIAGNOSIS — R7881 Bacteremia: Secondary | ICD-10-CM | POA: Diagnosis present

## 2024-10-19 DIAGNOSIS — N12 Tubulo-interstitial nephritis, not specified as acute or chronic: Secondary | ICD-10-CM | POA: Diagnosis not present

## 2024-10-19 LAB — CULTURE, BLOOD (ROUTINE X 2)

## 2024-10-19 LAB — GLUCOSE, CAPILLARY
Glucose-Capillary: 221 mg/dL — ABNORMAL HIGH (ref 70–99)
Glucose-Capillary: 233 mg/dL — ABNORMAL HIGH (ref 70–99)

## 2024-10-19 MED ORDER — LEVOFLOXACIN 750 MG PO TABS: 750.0000 mg | ORAL_TABLET | Freq: Every day | ORAL | 0 refills | Status: DC

## 2024-10-19 MED ORDER — AMOXICILLIN-POT CLAVULANATE 875-125 MG PO TABS: 1.0000 | ORAL_TABLET | Freq: Three times a day (TID) | ORAL | 0 refills | Status: AC

## 2024-10-19 NOTE — Plan of Care (Signed)

## 2024-10-19 NOTE — Discharge Summary (Signed)
 Physician Discharge Summary  Christie [name] FMW:[id number] DOB: 16-Jul-1946 DOA: 10/16/2024  PCP: Alphonsa [name], MD  Admit date: 10/16/2024 Discharge date: 10/19/2024  Admitted From: Home Disposition: Home  Recommendations for Outpatient Follow-up:  Follow up with PCP in 1 week, recommend CBC/BMP check Follow up in ED if symptoms worsen or new appear   Home Health: No Equipment/Devices: None  Discharge Condition: Stable CODE STATUS: Full Diet recommendation: Heart healthy/carbohydrate consistent diet  Brief/Interim Summary:  78 year old female with past medical history significant for diabetes mellitus on oral hypoglycemics, hypertension, hyperlipidemia, abdominal pain follows up with GI and has had EGD and colonoscopy, plan for capsule endoscopy in the future, IBS, recurrent UTIs presented to the ER with complaints of generalized abdominal pain, chills, nausea and vomiting.  CT scan of abdomen and pelvis revealed left-sided pyelonephritis, mild hydronephrosis with calculus in the renal pelvis.  Laboratory work notable for significant hypokalemia with potassium 2.7.  UA consistent with urinary tract infection.  Patient admitted for acute pyelonephritis and treated with IV ceftriaxone.  Both urine and blood culture came positive for pansensitive E. coli.  She was discharged home after about 3 days of IV antibiotics, 7 days of Augmentin  prescribed.  Patient is advised to follow-up with primary care provider within a week and check her potassium level. Discharged in stable condition.  Discharge Diagnoses:  Principal Problem:   Pyelonephritis Active Problems:   E coli bacteremia   Essential hypertension, benign   History of kidney stones   Abdominal pain   IBS (irritable bowel syndrome)    Discharge Instructions  Discharge Instructions     Call MD for:  persistant nausea and vomiting   Complete by: As directed    Call MD for:  severe uncontrolled pain   Complete by: As  directed    Call MD for:  temperature >100.4   Complete by: As directed    Diet - low sodium heart healthy   Complete by: As directed    Discharge instructions   Complete by: As directed    1.  Complete antibiotics course with Augmentin  3 times a day for 7 days 2.  Return to the ER if recurrence of symptoms 3.  Follow-up with GI, PCP   Increase activity slowly   Complete by: As directed       Allergies as of 10/19/2024       Reactions   Ace Inhibitors Other (See Comments)   Unknown    Atorvastatin Other (See Comments)   Elevated LFT's Liver enzymes go up   Gabapentin  Other (See Comments)   Unknown    Metformin And Related Other (See Comments)   Reaction is a side effect   Oxycodone-acetaminophen  Other (See Comments)   Unknown         Medication List     TAKE these medications    acetaminophen  500 MG tablet Commonly known as: TYLENOL  Take 1,000 mg by mouth every 6 (six) hours as needed (pain).   alendronate  70 MG tablet Commonly known as: FOSAMAX  Take 1 tablet (70 mg total) by mouth once a week. Take with a full glass of water  on an empty stomach.   ALPRAZolam  0.5 MG tablet Commonly known as: XANAX  TAKE 1 TABLET BY MOUTH AT BEDTIME.   amLODipine  10 MG tablet Commonly known as: NORVASC  Take 1 tablet (10 mg total) by mouth daily.   amoxicillin -clavulanate 875-125 MG tablet Commonly known as: AUGMENTIN  Take 1 tablet by mouth 3 (three) times daily for 7 days.  cholecalciferol 1000 units tablet Commonly known as: VITAMIN D  Take 1,000 Units by mouth daily.   dicyclomine  10 MG capsule Commonly known as: BENTYL  Take 1 capsule (10 mg total) by mouth every 12 (twelve) hours as needed for spasms.   ELDERBERRY EXTRACT PO Take by mouth. One tablespoon per day.   fluticasone  50 MCG/ACT nasal spray Commonly known as: FLONASE  Place 2 sprays into both nostrils daily. What changed:  when to take this reasons to take this   glipiZIDE  5 MG tablet Commonly  known as: GLUCOTROL  2 in the am 2 in the pm   MACULAR VITAMIN BENEFIT PO Take 1 tablet by mouth in the morning and at bedtime.   omeprazole  40 MG capsule Commonly known as: PRILOSEC Take 1 capsule (40 mg total) by mouth daily.   ondansetron  4 MG tablet Commonly known as: ZOFRAN  Take 1 tablet (4 mg total) by mouth every 8 (eight) hours as needed for nausea or vomiting.   OVER THE COUNTER MEDICATION IB Guard BID   potassium chloride  10 MEQ tablet Commonly known as: KLOR-CON  M Take 1 tablet (10 mEq total) by mouth daily.   rosuvastatin  40 MG tablet Commonly known as: CRESTOR  Take 1 tablet (40 mg total) by mouth daily.   sertraline  50 MG tablet Commonly known as: ZOLOFT  TAKE (1) TABLET BY MOUTH ONCE DAILY.   valsartan  160 MG tablet Commonly known as: DIOVAN  TAKE ONE TABLET BY MOUTH TWICE DAILY   vitamin C 1000 MG tablet Take 1,000 mg by mouth daily.   zinc gluconate 50 MG tablet Take 50 mg by mouth daily.        Allergies  Allergen Reactions   Ace Inhibitors Other (See Comments)    Unknown    Atorvastatin Other (See Comments)    Elevated LFT's Liver enzymes go up   Gabapentin  Other (See Comments)    Unknown    Metformin And Related Other (See Comments)    Reaction is a side effect   Oxycodone-Acetaminophen  Other (See Comments)    Unknown     Consultations:    Procedures/Studies: CT ABDOMEN PELVIS W CONTRAST Result Date: 10/16/2024 CLINICAL DATA:  Abdominal pain. EXAM: CT ABDOMEN AND PELVIS WITH CONTRAST TECHNIQUE: Multidetector CT imaging of the abdomen and pelvis was performed using the standard protocol following bolus administration of intravenous contrast. RADIATION DOSE REDUCTION: This exam was performed according to the departmental dose-optimization program which includes automated exposure control, adjustment of the mA and/or kV according to patient size and/or use of iterative reconstruction technique. CONTRAST:  OMNIPAQUE  IOHEXOL  300 MG/ML   SOLN COMPARISON:  CT abdomen pelvis dated 09/25/2024. FINDINGS: Lower chest: The visualized lung bases are clear. No intra-abdominal free air or free fluid. Hepatobiliary: There is slight irregularity of the liver contour suspicious for changes of cirrhosis. Clinical correlation is recommended. There is biliary ductal dilatation, post cholecystectomy. No retained calcified stone noted in the central CBD. Pancreas: Unremarkable. No pancreatic ductal dilatation or surrounding inflammatory changes. Spleen: Small hypoenhancing focus in the lateral spleen may represent an area of developing infarct. Adrenals/Urinary Tract: The adrenal glands are unremarkable. Left renal upper pole cyst. There is a 5 mm stone in the left renal pelvis. Two adjacent stones in the inferior pole of the left kidney measure up to 6 mm and a combined length of approximately 8 mm. There is mild left hydronephrosis. There is heterogeneous enhancement of the left renal parenchyma with urothelial enhancement consistent with left-sided pyelonephritis. Correlation with urinalysis recommended. No drainable fluid  collection or abscess. There is left perinephric stranding. The visualized ureters appear unremarkable. Air within the urinary bladder likely introduced by recent instrumentation. Stomach/Bowel: There is no bowel obstruction or active inflammation. The appendix is normal. Vascular/Lymphatic: Mild aortoiliac atherosclerotic disease. The IVC is unremarkable. No portal venous gas. Retroperitoneal and left para-aortic adenopathy, reactive. Reproductive: Hysterectomy.  No suspicious adnexal masses. Other: None Musculoskeletal: Osteopenia with extensive degenerative changes of the spine. Multilevel lumbar spine disc spacer and posterior fusion. No acute osseous pathology. IMPRESSION: 1. Left-sided pyelonephritis. Correlation with urinalysis recommended. No drainable fluid collection or abscess. 2. Left renal pelvis and inferior pole calculi with mild  left hydronephrosis. 3. No bowel obstruction. Normal appendix. 4.  Aortic Atherosclerosis (ICD10-I70.0). Electronically Signed   By: Vanetta Chou M.D.   On: 10/16/2024 14:05   CT Angio Abd/Pel W and/or Wo Contrast Result Date: 09/25/2024 CLINICAL DATA:  Abdominal pain and constipation for several months. EXAM: CTA ABDOMEN AND PELVIS WITHOUT AND WITH CONTRAST TECHNIQUE: Multidetector CT imaging of the abdomen and pelvis was performed using the standard protocol during bolus administration of intravenous contrast. Multiplanar reconstructed images and MIPs were obtained and reviewed to evaluate the vascular anatomy. RADIATION DOSE REDUCTION: This exam was performed according to the departmental dose-optimization program which includes automated exposure control, adjustment of the mA and/or kV according to patient size and/or use of iterative reconstruction technique. CONTRAST:  OMNIPAQUE  IOHEXOL  350 MG/ML SOLN COMPARISON:  July 10, 2024. FINDINGS: VASCULAR Aorta: Atherosclerosis of abdominal aorta is noted without aneurysm or dissection. Celiac: Mild narrowing is noted at origin of celiac artery without thrombus. SMA: Patent without evidence of aneurysm, dissection, vasculitis or significant stenosis. Renals: Both renal arteries are patent without evidence of aneurysm, dissection, vasculitis, fibromuscular dysplasia or significant stenosis. IMA: Patent without evidence of aneurysm, dissection, vasculitis or significant stenosis. Inflow: Patent without evidence of aneurysm, dissection, vasculitis or significant stenosis. Proximal Outflow: Bilateral common femoral and visualized portions of the superficial and profunda femoral arteries are patent without evidence of aneurysm, dissection, vasculitis or significant stenosis. Veins: No obvious venous abnormality within the limitations of this arterial phase study. Review of the MIP images confirms the above findings. NON-VASCULAR Lower chest: No acute  abnormality. Hepatobiliary: No focal liver abnormality is seen. Status post cholecystectomy. No biliary dilatation. Pancreas: Unremarkable. No pancreatic ductal dilatation or surrounding inflammatory changes. Spleen: Normal in size without focal abnormality. Adrenals/Urinary Tract: Adrenal glands appear normal. Nonobstructive left nephrolithiasis is noted. Left renal cyst is noted. No hydronephrosis or renal obstruction is noted. Gas is noted in non dependent portion of urinary bladder suggesting recent instrumentation or possibly urinary tract infection. Mild left ureteral wall thickening and possible enhancement is noted suggesting ascending urinary tract infection. Stomach/Bowel: Stomach is within normal limits. Appendix appears normal. No evidence of bowel wall thickening, distention, or inflammatory changes. Lymphatic: No significant vascular findings are present. No enlarged abdominal or pelvic lymph nodes. Reproductive: Status post hysterectomy. No adnexal masses. Other: No ascites or hernia was noted. Musculoskeletal: Status post surgical posterior fusion of L2-3, L4-5 and L5-S1. IMPRESSION: VASCULAR Atherosclerosis of abdominal aorta is noted without aneurysm or dissection. Mild narrowing is noted at origin of celiac artery without thrombus. NON-VASCULAR Nonobstructive left nephrolithiasis. 1. Gas is noted in non dependent portion of urinary bladder suggesting recent instrumentation or possibly urinary tract infection. Mild left ureteral wall thickening and possible enhancement is noted suggesting ascending urinary tract infection. 2. Status post cholecystectomy and hysterectomy. 3. Status post surgical posterior fusion of L2-3, L4-5  and L5-S1. Aortic Atherosclerosis (ICD10-I70.0). Electronically Signed   By: Lynwood Landy Raddle M.D.   On: 09/25/2024 15:55   US  ABDOMEN LIMITED RUQ (LIVER/GB) Result Date: 09/20/2024 CLINICAL DATA:  Abdominal discomfort. EXAM: ULTRASOUND ABDOMEN LIMITED RIGHT UPPER QUADRANT  COMPARISON:  Ultrasound exam 05/07/2015. CT scan 07/10/2024 FINDINGS: Gallbladder: Surgically absent. Common bile duct: Diameter: 10 mm diameter similar to 9 mm diameter on the prior ultrasound study from 05/07/2015. Liver: No focal lesion identified. Increased parenchymal echogenicity. Portal vein is patent on color Doppler imaging with normal direction of blood flow towards the liver. Other: Visualized portion of the pancreas unremarkable. IMPRESSION: 1. Status post cholecystectomy. 2. Common bile duct measures 10 mm diameter, similar to 9 mm diameter on the prior ultrasound study from 05/07/2015. 3. Increased hepatic parenchymal echogenicity suggests fatty deposition. 4. No pancreatic abnormality evident although CT imaging would be a more sensitive means to evaluate pancreatic parenchymal anatomy. Electronically Signed   By: Camellia Candle M.D.   On: 09/20/2024 07:47      Subjective:   Discharge Exam: Vitals:   10/18/24 2108 10/19/24 0419  BP: (!) 151/69 (!) 151/74  Pulse: 97 87  Resp: 18 17  Temp: 97.8 F (36.6 C) 97.7 F (36.5 C)  SpO2: 95% 94%    General: Pt is alert, awake, not in acute distress Cardiovascular: rate controlled, S1/S2 + Respiratory: bilateral decreased breath sounds at bases Abdominal: Soft, NT, ND, bowel sounds + Extremities: no edema, no cyanosis    The results of significant diagnostics from this hospitalization (including imaging, microbiology, ancillary and laboratory) are listed below for reference.     Microbiology: Recent Results (from the past 240 hours)  Urine Culture     Status: Abnormal   Collection Time: 10/16/24 11:30 AM   Specimen: Urine, Clean Catch  Result Value Ref Range Status   Specimen Description   Final    URINE, CLEAN CATCH Performed at [facility], [address] [place], [place] [address]    Special Requests   Final    NONE Performed at [facility], [address] [place], [place] [address]    Culture >=100,000  COLONIES/mL ESCHERICHIA COLI (A)  Final   Report Status 10/18/2024 FINAL  Final   Organism ID, Bacteria ESCHERICHIA COLI (A)  Final      Susceptibility   Escherichia coli - MIC*    AMPICILLIN 8 SENSITIVE Sensitive     CEFAZOLIN  (URINE) Value in next row Sensitive      <=1 SENSITIVEThis is a modified FDA-approved test that has been validated and its performance characteristics determined by the reporting laboratory.  This laboratory is certified under the Clinical Laboratory Improvement Amendments CLIA as qualified to perform high complexity clinical laboratory testing.    CEFEPIME Value in next row Sensitive      <=1 SENSITIVEThis is a modified FDA-approved test that has been validated and its performance characteristics determined by the reporting laboratory.  This laboratory is certified under the Clinical Laboratory Improvement Amendments CLIA as qualified to perform high complexity clinical laboratory testing.    ERTAPENEM Value in next row Sensitive      <=1 SENSITIVEThis is a modified FDA-approved test that has been validated and its performance characteristics determined by the reporting laboratory.  This laboratory is certified under the Clinical Laboratory Improvement Amendments CLIA as qualified to perform high complexity clinical laboratory testing.    CEFTRIAXONE Value in next row Sensitive      <=1 SENSITIVEThis is a modified FDA-approved test that  has been validated and its performance characteristics determined by the reporting laboratory.  This laboratory is certified under the Clinical Laboratory Improvement Amendments CLIA as qualified to perform high complexity clinical laboratory testing.    CIPROFLOXACIN Value in next row Sensitive      <=1 SENSITIVEThis is a modified FDA-approved test that has been validated and its performance characteristics determined by the reporting laboratory.  This laboratory is certified under the Clinical Laboratory Improvement Amendments CLIA as  qualified to perform high complexity clinical laboratory testing.    GENTAMICIN Value in next row Sensitive      <=1 SENSITIVEThis is a modified FDA-approved test that has been validated and its performance characteristics determined by the reporting laboratory.  This laboratory is certified under the Clinical Laboratory Improvement Amendments CLIA as qualified to perform high complexity clinical laboratory testing.    NITROFURANTOIN  Value in next row Sensitive      <=1 SENSITIVEThis is a modified FDA-approved test that has been validated and its performance characteristics determined by the reporting laboratory.  This laboratory is certified under the Clinical Laboratory Improvement Amendments CLIA as qualified to perform high complexity clinical laboratory testing.    TRIMETH/SULFA Value in next row Sensitive      <=1 SENSITIVEThis is a modified FDA-approved test that has been validated and its performance characteristics determined by the reporting laboratory.  This laboratory is certified under the Clinical Laboratory Improvement Amendments CLIA as qualified to perform high complexity clinical laboratory testing.    AMPICILLIN/SULBACTAM Value in next row Sensitive      <=1 SENSITIVEThis is a modified FDA-approved test that has been validated and its performance characteristics determined by the reporting laboratory.  This laboratory is certified under the Clinical Laboratory Improvement Amendments CLIA as qualified to perform high complexity clinical laboratory testing.    PIP/TAZO Value in next row Sensitive      <=4 SENSITIVEThis is a modified FDA-approved test that has been validated and its performance characteristics determined by the reporting laboratory.  This laboratory is certified under the Clinical Laboratory Improvement Amendments CLIA as qualified to perform high complexity clinical laboratory testing.    MEROPENEM Value in next row Sensitive      <=4 SENSITIVEThis is a modified  FDA-approved test that has been validated and its performance characteristics determined by the reporting laboratory.  This laboratory is certified under the Clinical Laboratory Improvement Amendments CLIA as qualified to perform high complexity clinical laboratory testing.    * >=100,000 COLONIES/mL ESCHERICHIA COLI  Blood culture (routine x 2)     Status: Abnormal   Collection Time: 10/16/24  2:51 PM   Specimen: BLOOD  Result Value Ref Range Status   Specimen Description   Final    BLOOD BLOOD LEFT HAND Performed at [facility], [address] [place], [place] [address]    Special Requests   Final    BOTTLES DRAWN AEROBIC AND ANAEROBIC Blood Culture results may not be optimal due to an inadequate volume of blood received in culture bottles Performed at [facility], [address], [place], [place] [address]    Culture  Setup Time   Final    ANAEROBIC BOTTLE ONLY GRAM NEGATIVE RODS Gram Stain Report Called to,Read Back By and Verified With: DOROTHYANN ISLAND @ 1628 ON 10/17/24 C VARNER CRITICAL RESULT CALLED TO, READ BACK BY AND VERIFIED WITH: A SIPAT RN 10/17/2024 @ 2250 BY AB Performed at [facility], [address], [place], [place] [address]    Culture  ESCHERICHIA COLI (A)  Final   Report Status 10/19/2024 FINAL  Final   Organism ID, Bacteria ESCHERICHIA COLI  Final      Susceptibility   Escherichia coli - MIC*    AMPICILLIN 8 SENSITIVE Sensitive     CEFAZOLIN  (NON-URINE) <=1 SENSITIVE Sensitive     CEFEPIME <=0.12 SENSITIVE Sensitive     ERTAPENEM <=0.12 SENSITIVE Sensitive     CEFTRIAXONE <=0.25 SENSITIVE Sensitive     CIPROFLOXACIN <=0.06 SENSITIVE Sensitive     GENTAMICIN <=1 SENSITIVE Sensitive     MEROPENEM <=0.25 SENSITIVE Sensitive     TRIMETH/SULFA <=20 SENSITIVE Sensitive     AMPICILLIN/SULBACTAM <=2 SENSITIVE Sensitive     PIP/TAZO Value in next row Sensitive      <=4 SENSITIVEThis is a modified FDA-approved test that has been validated and its  performance characteristics determined by the reporting laboratory.  This laboratory is certified under the Clinical Laboratory Improvement Amendments CLIA as qualified to perform high complexity clinical laboratory testing.    * ESCHERICHIA COLI  Blood culture (routine x 2)     Status: None (Preliminary result)   Collection Time: 10/16/24  2:51 PM   Specimen: BLOOD  Result Value Ref Range Status   Specimen Description BLOOD LEFT ANTECUBITAL  Final   Special Requests   Final    BOTTLES DRAWN AEROBIC ONLY Blood Culture results may not be optimal due to an inadequate volume of blood received in culture bottles   Culture   Final    NO GROWTH 3 DAYS Performed at [facility], [address] [place], [place] [address]    Report Status PENDING  Incomplete  Blood Culture ID Panel (Reflexed)     Status: Abnormal   Collection Time: 10/16/24  2:51 PM  Result Value Ref Range Status   Enterococcus faecalis NOT DETECTED NOT DETECTED Final   Enterococcus Faecium NOT DETECTED NOT DETECTED Final   Listeria monocytogenes NOT DETECTED NOT DETECTED Final   Staphylococcus species NOT DETECTED NOT DETECTED Final   Staphylococcus aureus (BCID) NOT DETECTED NOT DETECTED Final   Staphylococcus epidermidis NOT DETECTED NOT DETECTED Final   Staphylococcus lugdunensis NOT DETECTED NOT DETECTED Final   Streptococcus species NOT DETECTED NOT DETECTED Final   Streptococcus agalactiae NOT DETECTED NOT DETECTED Final   Streptococcus pneumoniae NOT DETECTED NOT DETECTED Final   Streptococcus pyogenes NOT DETECTED NOT DETECTED Final   A.calcoaceticus-baumannii NOT DETECTED NOT DETECTED Final   Bacteroides fragilis NOT DETECTED NOT DETECTED Final   Enterobacterales DETECTED (A) NOT DETECTED Final    Comment: Enterobacterales represent a large order of gram negative bacteria, not a single organism. CRITICAL RESULT CALLED TO, READ BACK BY AND VERIFIED WITH: A SIPAT RN 10/17/2024 @ 2250 BY AB    Enterobacter cloacae  complex NOT DETECTED NOT DETECTED Final   Escherichia coli DETECTED (A) NOT DETECTED Final    Comment: CRITICAL RESULT CALLED TO, READ BACK BY AND VERIFIED WITH: A SIPAT RN 10/17/2024 @ 2250 BY AB    Klebsiella aerogenes NOT DETECTED NOT DETECTED Final   Klebsiella oxytoca NOT DETECTED NOT DETECTED Final   Klebsiella pneumoniae NOT DETECTED NOT DETECTED Final   Proteus species NOT DETECTED NOT DETECTED Final   Salmonella species NOT DETECTED NOT DETECTED Final   Serratia marcescens NOT DETECTED NOT DETECTED Final   Haemophilus influenzae NOT DETECTED NOT DETECTED Final   Neisseria meningitidis NOT DETECTED NOT DETECTED Final   Pseudomonas aeruginosa NOT DETECTED NOT DETECTED Final   Stenotrophomonas maltophilia NOT DETECTED NOT  DETECTED Final   Candida albicans NOT DETECTED NOT DETECTED Final   Candida auris NOT DETECTED NOT DETECTED Final   Candida glabrata NOT DETECTED NOT DETECTED Final   Candida krusei NOT DETECTED NOT DETECTED Final   Candida parapsilosis NOT DETECTED NOT DETECTED Final   Candida tropicalis NOT DETECTED NOT DETECTED Final   Cryptococcus neoformans/gattii NOT DETECTED NOT DETECTED Final   CTX-M ESBL NOT DETECTED NOT DETECTED Final   Carbapenem resistance IMP NOT DETECTED NOT DETECTED Final   Carbapenem resistance KPC NOT DETECTED NOT DETECTED Final   Carbapenem resistance NDM NOT DETECTED NOT DETECTED Final   Carbapenem resist OXA 48 LIKE NOT DETECTED NOT DETECTED Final   Carbapenem resistance VIM NOT DETECTED NOT DETECTED Final    Comment: Performed at [facility], [address], [place], [place] [address]     Labs: BNP (last 3 results) No results for input(s): BNP in the last 8760 hours. Basic Metabolic Panel: Recent Labs  Lab 10/16/24 1112 10/16/24 1451 10/17/24 0434 10/18/24 0930  NA 137  --  137 137  K 2.7* 2.9* 3.1* 3.3*  CL 99  --  102 98  CO2 24  --  26 28  GLUCOSE 291*  --  227* 284*  BUN 10  --  9 8  CREATININE 0.60  --  0.36*  0.46  CALCIUM  9.2  --  8.4* 8.9   Liver Function Tests: Recent Labs  Lab 10/16/24 1112  AST 26  ALT 15  ALKPHOS 78  BILITOT 0.9  PROT 6.0*  ALBUMIN 3.6   Recent Labs  Lab 10/16/24 1112  LIPASE 24   No results for input(s): AMMONIA in the last 168 hours. CBC: Recent Labs  Lab 10/16/24 1112 10/17/24 0434  WBC 14.6* 12.8*  NEUTROABS 13.5*  --   HGB 12.0 10.5*  HCT 35.8* 31.3*  MCV 93.0 92.9  PLT 152 122*   Cardiac Enzymes: No results for input(s): CKTOTAL, CKMB, CKMBINDEX, TROPONINI in the last 168 hours. BNP: Invalid input(s): POCBNP CBG: Recent Labs  Lab 10/18/24 1125 10/18/24 1627 10/18/24 2041 10/19/24 0737 10/19/24 1114  GLUCAP 237* 258* 254* 221* 233*   D-Dimer No results for input(s): DDIMER in the last 72 hours. Hgb A1c No results for input(s): HGBA1C in the last 72 hours. Lipid Profile No results for input(s): CHOL, HDL, LDLCALC, TRIG, CHOLHDL, LDLDIRECT in the last 72 hours. Thyroid function studies No results for input(s): TSH, T4TOTAL, T3FREE, THYROIDAB in the last 72 hours.  Invalid input(s): FREET3 Anemia work up No results for input(s): VITAMINB12, FOLATE, FERRITIN, TIBC, IRON, RETICCTPCT in the last 72 hours. Urinalysis    Component Value Date/Time   COLORURINE YELLOW 10/16/2024 1130   APPEARANCEUR TURBID (A) 10/16/2024 1130   LABSPEC 1.021 10/16/2024 1130   PHURINE 5.0 10/16/2024 1130   GLUCOSEU >=500 (A) 10/16/2024 1130   HGBUR LARGE (A) 10/16/2024 1130   BILIRUBINUR NEGATIVE 10/16/2024 1130   BILIRUBINUR negative 09/20/2021 1559   KETONESUR 5 (A) 10/16/2024 1130   PROTEINUR >=300 (A) 10/16/2024 1130   UROBILINOGEN 0.2 09/20/2021 1559   NITRITE NEGATIVE 10/16/2024 1130   LEUKOCYTESUR MODERATE (A) 10/16/2024 1130   Sepsis Labs Recent Labs  Lab 10/16/24 1112 10/17/24 0434  WBC 14.6* 12.8*   Microbiology Recent Results (from the past 240 hours)  Urine Culture     Status:  Abnormal   Collection Time: 10/16/24 11:30 AM   Specimen: Urine, Clean Catch  Result Value Ref Range Status   Specimen Description  Final    URINE, CLEAN CATCH Performed at [facility], [address], [place], [place] [address]    Special Requests   Final    NONE Performed at [facility], [address], [place], [place] [address]    Culture >=100,000 COLONIES/mL ESCHERICHIA COLI (A)  Final   Report Status 10/18/2024 FINAL  Final   Organism ID, Bacteria ESCHERICHIA COLI (A)  Final      Susceptibility   Escherichia coli - MIC*    AMPICILLIN 8 SENSITIVE Sensitive     CEFAZOLIN  (URINE) Value in next row Sensitive      <=1 SENSITIVEThis is a modified FDA-approved test that has been validated and its performance characteristics determined by the reporting laboratory.  This laboratory is certified under the Clinical Laboratory Improvement Amendments CLIA as qualified to perform high complexity clinical laboratory testing.    CEFEPIME Value in next row Sensitive      <=1 SENSITIVEThis is a modified FDA-approved test that has been validated and its performance characteristics determined by the reporting laboratory.  This laboratory is certified under the Clinical Laboratory Improvement Amendments CLIA as qualified to perform high complexity clinical laboratory testing.    ERTAPENEM Value in next row Sensitive      <=1 SENSITIVEThis is a modified FDA-approved test that has been validated and its performance characteristics determined by the reporting laboratory.  This laboratory is certified under the Clinical Laboratory Improvement Amendments CLIA as qualified to perform high complexity clinical laboratory testing.    CEFTRIAXONE Value in next row Sensitive      <=1 SENSITIVEThis is a modified FDA-approved test that has been validated and its performance characteristics determined by the reporting laboratory.  This laboratory is certified under the Clinical Laboratory Improvement Amendments CLIA  as qualified to perform high complexity clinical laboratory testing.    CIPROFLOXACIN Value in next row Sensitive      <=1 SENSITIVEThis is a modified FDA-approved test that has been validated and its performance characteristics determined by the reporting laboratory.  This laboratory is certified under the Clinical Laboratory Improvement Amendments CLIA as qualified to perform high complexity clinical laboratory testing.    GENTAMICIN Value in next row Sensitive      <=1 SENSITIVEThis is a modified FDA-approved test that has been validated and its performance characteristics determined by the reporting laboratory.  This laboratory is certified under the Clinical Laboratory Improvement Amendments CLIA as qualified to perform high complexity clinical laboratory testing.    NITROFURANTOIN  Value in next row Sensitive      <=1 SENSITIVEThis is a modified FDA-approved test that has been validated and its performance characteristics determined by the reporting laboratory.  This laboratory is certified under the Clinical Laboratory Improvement Amendments CLIA as qualified to perform high complexity clinical laboratory testing.    TRIMETH/SULFA Value in next row Sensitive      <=1 SENSITIVEThis is a modified FDA-approved test that has been validated and its performance characteristics determined by the reporting laboratory.  This laboratory is certified under the Clinical Laboratory Improvement Amendments CLIA as qualified to perform high complexity clinical laboratory testing.    AMPICILLIN/SULBACTAM Value in next row Sensitive      <=1 SENSITIVEThis is a modified FDA-approved test that has been validated and its performance characteristics determined by the reporting laboratory.  This laboratory is certified under the Clinical Laboratory Improvement Amendments CLIA as qualified to perform high complexity clinical laboratory testing.    PIP/TAZO Value in next row Sensitive      <=  4 SENSITIVEThis is a modified  FDA-approved test that has been validated and its performance characteristics determined by the reporting laboratory.  This laboratory is certified under the Clinical Laboratory Improvement Amendments CLIA as qualified to perform high complexity clinical laboratory testing.    MEROPENEM Value in next row Sensitive      <=4 SENSITIVEThis is a modified FDA-approved test that has been validated and its performance characteristics determined by the reporting laboratory.  This laboratory is certified under the Clinical Laboratory Improvement Amendments CLIA as qualified to perform high complexity clinical laboratory testing.    * >=100,000 COLONIES/mL ESCHERICHIA COLI  Blood culture (routine x 2)     Status: Abnormal   Collection Time: 10/16/24  2:51 PM   Specimen: BLOOD  Result Value Ref Range Status   Specimen Description   Final    BLOOD BLOOD LEFT HAND Performed at [facility], [address], [place], [place] [address]    Special Requests   Final    BOTTLES DRAWN AEROBIC AND ANAEROBIC Blood Culture results may not be optimal due to an inadequate volume of blood received in culture bottles Performed at [facility], [address], [place], [place] [address]    Culture  Setup Time   Final    ANAEROBIC BOTTLE ONLY GRAM NEGATIVE RODS Gram Stain Report Called to,Read Back By and Verified With: DOROTHYANN ISLAND @ 1628 ON 10/17/24 C VARNER CRITICAL RESULT CALLED TO, READ BACK BY AND VERIFIED WITH: A SIPAT RN 10/17/2024 @ 2250 BY AB Performed at [facility], [address] [place], [place] [address]    Culture ESCHERICHIA COLI (A)  Final   Report Status 10/19/2024 FINAL  Final   Organism ID, Bacteria ESCHERICHIA COLI  Final      Susceptibility   Escherichia coli - MIC*    AMPICILLIN 8 SENSITIVE Sensitive     CEFAZOLIN  (NON-URINE) <=1 SENSITIVE Sensitive     CEFEPIME <=0.12 SENSITIVE Sensitive     ERTAPENEM <=0.12 SENSITIVE Sensitive     CEFTRIAXONE <=0.25 SENSITIVE Sensitive      CIPROFLOXACIN <=0.06 SENSITIVE Sensitive     GENTAMICIN <=1 SENSITIVE Sensitive     MEROPENEM <=0.25 SENSITIVE Sensitive     TRIMETH/SULFA <=20 SENSITIVE Sensitive     AMPICILLIN/SULBACTAM <=2 SENSITIVE Sensitive     PIP/TAZO Value in next row Sensitive      <=4 SENSITIVEThis is a modified FDA-approved test that has been validated and its performance characteristics determined by the reporting laboratory.  This laboratory is certified under the Clinical Laboratory Improvement Amendments CLIA as qualified to perform high complexity clinical laboratory testing.    * ESCHERICHIA COLI  Blood culture (routine x 2)     Status: None (Preliminary result)   Collection Time: 10/16/24  2:51 PM   Specimen: BLOOD  Result Value Ref Range Status   Specimen Description BLOOD LEFT ANTECUBITAL  Final   Special Requests   Final    BOTTLES DRAWN AEROBIC ONLY Blood Culture results may not be optimal due to an inadequate volume of blood received in culture bottles   Culture   Final    NO GROWTH 3 DAYS Performed at [facility] [place], [address], [place], [place] [address]    Report Status PENDING  Incomplete  Blood Culture ID Panel (Reflexed)     Status: Abnormal   Collection Time: 10/16/24  2:51 PM  Result Value Ref Range Status   Enterococcus faecalis NOT DETECTED NOT DETECTED Final   Enterococcus Faecium NOT DETECTED  NOT DETECTED Final   Listeria monocytogenes NOT DETECTED NOT DETECTED Final   Staphylococcus species NOT DETECTED NOT DETECTED Final   Staphylococcus aureus (BCID) NOT DETECTED NOT DETECTED Final   Staphylococcus epidermidis NOT DETECTED NOT DETECTED Final   Staphylococcus lugdunensis NOT DETECTED NOT DETECTED Final   Streptococcus species NOT DETECTED NOT DETECTED Final   Streptococcus agalactiae NOT DETECTED NOT DETECTED Final   Streptococcus pneumoniae NOT DETECTED NOT DETECTED Final   Streptococcus pyogenes NOT DETECTED NOT DETECTED Final   A.calcoaceticus-baumannii NOT DETECTED NOT  DETECTED Final   Bacteroides fragilis NOT DETECTED NOT DETECTED Final   Enterobacterales DETECTED (A) NOT DETECTED Final    Comment: Enterobacterales represent a large order of gram negative bacteria, not a single organism. CRITICAL RESULT CALLED TO, READ BACK BY AND VERIFIED WITH: A SIPAT RN 10/17/2024 @ 2250 BY AB    Enterobacter cloacae complex NOT DETECTED NOT DETECTED Final   Escherichia coli DETECTED (A) NOT DETECTED Final    Comment: CRITICAL RESULT CALLED TO, READ BACK BY AND VERIFIED WITH: A SIPAT RN 10/17/2024 @ 2250 BY AB    Klebsiella aerogenes NOT DETECTED NOT DETECTED Final   Klebsiella oxytoca NOT DETECTED NOT DETECTED Final   Klebsiella pneumoniae NOT DETECTED NOT DETECTED Final   Proteus species NOT DETECTED NOT DETECTED Final   Salmonella species NOT DETECTED NOT DETECTED Final   Serratia marcescens NOT DETECTED NOT DETECTED Final   Haemophilus influenzae NOT DETECTED NOT DETECTED Final   Neisseria meningitidis NOT DETECTED NOT DETECTED Final   Pseudomonas aeruginosa NOT DETECTED NOT DETECTED Final   Stenotrophomonas maltophilia NOT DETECTED NOT DETECTED Final   Candida albicans NOT DETECTED NOT DETECTED Final   Candida auris NOT DETECTED NOT DETECTED Final   Candida glabrata NOT DETECTED NOT DETECTED Final   Candida krusei NOT DETECTED NOT DETECTED Final   Candida parapsilosis NOT DETECTED NOT DETECTED Final   Candida tropicalis NOT DETECTED NOT DETECTED Final   Cryptococcus neoformans/gattii NOT DETECTED NOT DETECTED Final   CTX-M ESBL NOT DETECTED NOT DETECTED Final   Carbapenem resistance IMP NOT DETECTED NOT DETECTED Final   Carbapenem resistance KPC NOT DETECTED NOT DETECTED Final   Carbapenem resistance NDM NOT DETECTED NOT DETECTED Final   Carbapenem resist OXA 48 LIKE NOT DETECTED NOT DETECTED Final   Carbapenem resistance VIM NOT DETECTED NOT DETECTED Final    Comment: Performed at [facility], [address], [place], [place] [address]      Time coordinating discharge: 35 minutes  SIGNED:   Derryl Duval, MD  [facility] 10/19/2024, 2:32 PM

## 2024-10-19 NOTE — Plan of Care (Signed)

## 2024-10-21 ENCOUNTER — Telehealth: Payer: Self-pay | Admitting: *Deleted

## 2024-10-21 ENCOUNTER — Telehealth: Payer: Self-pay | Admitting: Family Medicine

## 2024-10-21 ENCOUNTER — Encounter: Payer: Self-pay | Admitting: *Deleted

## 2024-10-21 ENCOUNTER — Ambulatory Visit (HOSPITAL_COMMUNITY)

## 2024-10-21 LAB — CULTURE, BLOOD (ROUTINE X 2): Culture: NO GROWTH

## 2024-10-21 NOTE — Transitions of Care (Post Inpatient/ED Visit) (Signed)
 10/21/2024  Name: Christie [name] MRN: [id number] DOB: Sep 10, 1946  Today's TOC FU Call Status: Today's TOC FU Call Status:: Successful TOC FU Call Completed TOC FU Call Complete Date: 10/21/24 Patient's Name and Date of Birth confirmed.  Transition Care Management Follow-up Telephone Call Date of Discharge: 10/19/24 [facility] Type of Discharge: Inpatient Admission Primary Inpatient Discharge Diagnosis:: Pyelonephritis How have you been since you were released from the hospital?: Better (eating, drinking well, appetite is improving, ambulating without difficulty) Any questions or concerns?: No  Items Reviewed: Did you receive and understand the discharge instructions provided?: Yes Medications obtained,verified, and reconciled?: Yes (Medications Reviewed) Any new allergies since your discharge?: No Dietary orders reviewed?: Yes Type of Diet Ordered:: heart healthy,  carbohydrate modified Do you have support at home?: Yes People in Home [RPT]: alone Name of Support/Comfort Primary Source: adult daughter Christie [name] Reviewed signs/ symptoms of infection, pyelonephritis Reviewed importance of staying well hydrated  Medications Reviewed Today: Medications Reviewed Today     Reviewed by Aura Mliss LABOR, RN (Registered Nurse) on 10/21/24 at 1402  Med List Status: <None>   Medication Order Taking? Sig Documenting Provider Last Dose Status Informant  acetaminophen  (TYLENOL ) 500 MG tablet [id number] Yes Take 1,000 mg by mouth every 6 (six) hours as needed (pain). [provider]  Active Self, Pharmacy Records  alendronate  (FOSAMAX ) 70 MG tablet [id number]  Take 1 tablet (70 mg total) by mouth once a week. Take with a full glass of water  on an empty stomach.  Patient not taking: Reported on 10/21/2024   Alphonsa [name], MD  Active Self, Pharmacy Records  ALPRAZolam  (XANAX ) 0.5 MG tablet [id number] Yes TAKE 1 TABLET BY MOUTH AT BEDTIME. Alphonsa [name], MD   Active Self, Pharmacy Records  amLODipine  (NORVASC ) 10 MG tablet [id number] Yes Take 1 tablet (10 mg total) by mouth daily. Alphonsa [name], MD  Active Self, Pharmacy Records  amoxicillin -clavulanate (AUGMENTIN ) 875-125 MG tablet [id number] Yes Take 1 tablet by mouth 3 (three) times daily for 7 days. Sigdel, Santosh, MD  Active   Ascorbic Acid (VITAMIN C) 1000 MG tablet [id number] Yes Take 1,000 mg by mouth daily. [provider]  Active Self, Pharmacy Records  cholecalciferol (VITAMIN D ) 1000 UNITS tablet [id number] Yes Take 1,000 Units by mouth daily. [provider]  Active Self, Pharmacy Records  dicyclomine  (BENTYL ) 10 MG capsule [id number] Yes Take 1 capsule (10 mg total) by mouth every 12 (twelve) hours as needed for spasms. Castaneda Mayorga, Daniel, MD  Active Self, Pharmacy Records  Elderberry-Vitamin C-Zinc [facility] EXTRACT PO) [id number] Yes Take by mouth. One tablespoon per day. [provider]  Active Self, Pharmacy Records  fluticasone  (FLONASE ) 50 MCG/ACT nasal spray [id number] Yes Place 2 sprays into both nostrils daily. Karis Clunes, MD  Active Self, Pharmacy Records  glipiZIDE  (GLUCOTROL ) 5 MG tablet [id number] Yes 2 in the am 2 in the pm [name], [name], MD  Active Self, Pharmacy Records  Multiple Vitamins-Minerals (MACULAR VITAMIN BENEFIT PO) [id number] Yes Take 1 tablet by mouth in the morning and at bedtime. [provider]  Active Self, Pharmacy Records  omeprazole  [facility]) 40 MG capsule [id number] Yes Take 1 capsule (40 mg total) by mouth daily. Eartha Flavors, Toribio, MD  Active Self, Pharmacy Records  ondansetron  (ZOFRAN ) 4 MG tablet [id number] Yes Take 1 tablet (4 mg total) by mouth every 8 (eight) hours as needed for nausea or vomiting. Castaneda Mayorga, Daniel, MD  Active Self, Pharmacy Records  OVER THE COUNTER MEDICATION [id number]  IB Guard BID [provider]  Active Self, Pharmacy Records  potassium chloride  (KLOR-CON  M) 10 MEQ  tablet [id number] Yes Take 1 tablet (10 mEq total) by mouth daily. Alphonsa [name], MD  Active Self, Pharmacy Records  rosuvastatin  (CRESTOR ) 40 MG tablet [id number] Yes Take 1 tablet (40 mg total) by mouth daily. Alphonsa [name], MD  Active Self, Pharmacy Records  sertraline  (ZOLOFT ) 50 MG tablet [id number] Yes TAKE (1) TABLET BY MOUTH ONCE DAILY. Alphonsa [name], MD  Active Self, Pharmacy Records  valsartan  (DIOVAN ) 160 MG tablet [id number] Yes TAKE ONE TABLET BY MOUTH TWICE DAILY Alphonsa [name], MD  Active Self, Pharmacy Records  zinc gluconate 50 MG tablet [id number] Yes Take 50 mg by mouth daily. [provider]  Active Self, Pharmacy Records            Home Care and Equipment/Supplies: Were Home Health Services Ordered?: No Any new equipment or medical supplies ordered?: No  Functional Questionnaire: Do you need assistance with bathing/showering or dressing?: No Do you need assistance with meal preparation?: No Do you need assistance with eating?: No Do you have difficulty maintaining continence: No Do you need assistance with getting out of bed/getting out of a chair/moving?: No Do you have difficulty managing or taking your medications?: No  Follow up appointments reviewed: PCP Follow-up appointment confirmed?: Yes Date of PCP follow-up appointment?: 10/22/24 Follow-up Provider: Dr. Glendia [facility] Follow-up appointment confirmed?: NA Do you need transportation to your follow-up appointment?: No Do you understand care options if your condition(s) worsen?: Yes-patient verbalized understanding  SDOH Interventions Today    Flowsheet Row Most Recent Value  SDOH Interventions   Food Insecurity Interventions Intervention Not Indicated  Housing Interventions Intervention Not Indicated  Transportation Interventions Intervention Not Indicated  Utilities Interventions Intervention Not Indicated    Mliss Creed [facility], BSN RN Care Manager/ Transition of  Care [place]/ [facility] Population [facility] [phone]

## 2024-10-21 NOTE — Telephone Encounter (Signed)
 PA pending via availity Status Pended Status Message Requires Medical Review Reference Number:  [id number] Transaction ID: [id number] Payer Record Number: [id number] Certification Number: [id number]

## 2024-10-21 NOTE — Telephone Encounter (Signed)
 Call patient regarding glucose readings may need to be on insulin 

## 2024-10-22 ENCOUNTER — Other Ambulatory Visit: Payer: Self-pay

## 2024-10-22 ENCOUNTER — Encounter: Payer: Self-pay | Admitting: Family Medicine

## 2024-10-22 ENCOUNTER — Ambulatory Visit (INDEPENDENT_AMBULATORY_CARE_PROVIDER_SITE_OTHER): Payer: Self-pay | Admitting: Family Medicine

## 2024-10-22 ENCOUNTER — Other Ambulatory Visit: Payer: Self-pay | Admitting: Family Medicine

## 2024-10-22 ENCOUNTER — Telehealth: Payer: Self-pay | Admitting: Family Medicine

## 2024-10-22 VITALS — BP 148/80 | HR 94 | Temp 97.3°F | Ht 63.0 in | Wt 148.0 lb

## 2024-10-22 DIAGNOSIS — E876 Hypokalemia: Secondary | ICD-10-CM | POA: Diagnosis not present

## 2024-10-22 DIAGNOSIS — R1084 Generalized abdominal pain: Secondary | ICD-10-CM | POA: Diagnosis not present

## 2024-10-22 DIAGNOSIS — E119 Type 2 diabetes mellitus without complications: Secondary | ICD-10-CM | POA: Diagnosis not present

## 2024-10-22 DIAGNOSIS — N200 Calculus of kidney: Secondary | ICD-10-CM | POA: Diagnosis not present

## 2024-10-22 DIAGNOSIS — K580 Irritable bowel syndrome with diarrhea: Secondary | ICD-10-CM

## 2024-10-22 DIAGNOSIS — N390 Urinary tract infection, site not specified: Secondary | ICD-10-CM

## 2024-10-22 DIAGNOSIS — N12 Tubulo-interstitial nephritis, not specified as acute or chronic: Secondary | ICD-10-CM

## 2024-10-22 DIAGNOSIS — R109 Unspecified abdominal pain: Secondary | ICD-10-CM | POA: Diagnosis not present

## 2024-10-22 MED ORDER — DICYCLOMINE HCL 10 MG PO CAPS: ORAL_CAPSULE | ORAL | 1 refills | Status: DC

## 2024-10-22 NOTE — Telephone Encounter (Signed)
 Patient has appointment Tuesday morning we will discuss this with her

## 2024-10-22 NOTE — Progress Notes (Signed)
   Subjective:    Patient ID: Christie [name], female    DOB: [birth year], 78 y.o.   MRN: [id number]  HPI  Hospital follow up - on abx for uti  Discussed the use of AI scribe software for clinical note transcription with the patient, who gave verbal consent to proceed.  History of Present Illness   Christie [name] is an 78 year old female with a recent urinary tract infection and kidney stones who presents with fatigue and loose stools following hospital discharge.  She has been experiencing fatigue and low energy since her hospital discharge. Her appetite is gradually improving, and she manages her fluid intake well. No pain or blood during urination is reported.  She experiences loose stools, which she attributes to antibiotic use, and notes an urgent need to defecate when the urge arises. There is no mucus or blood in her stools. She was discharged with two antibiotics, Levaquin  and Augmentin .  She has a history of kidney stones, with recent imaging showing small stones in the left kidney and mild left hydronephrosis. She reports some pain on the left side, which she associates with the kidney stones, but has not experienced the extreme pain typically associated with passing a stone.  Her blood sugars were elevated during her hospital stay, with the lowest reading being 200. She has not checked her blood sugars since returning home and acknowledges that infection can exacerbate her diabetes, leading to higher blood sugar levels.  She experiences intermittent abdominal discomfort that 'comes and goes,' and Bentyl  has been effective in managing this pain. She takes Bentyl  three times a day as needed for pain relief.  She reports difficulty sleeping, which she attributes to discomfort and the hospital environment. She has not been sleeping well since returning home.      Review of Systems     Objective:   Physical Exam General-in no acute distress Eyes-no discharge Lungs-respiratory rate  normal, CTA CV-no murmurs,RRR Extremities skin warm dry no edema Neuro grossly normal Behavior normal, alert Abdomen is soft with subjective discomfort in the lower abdomen left side       Assessment & Plan:  1. Diabetes mellitus without complication (HCC) (Primary) She will send us  sugars in approximately 7 to 10 days may or may not insulin  depending on the results - Basic Metabolic Panel (BMET)  2. Hypokalemia Recheck lab work to follow-up on potassium - Basic Metabolic Panel (BMET) - Magnesium  3. Abdominal discomfort Finish out antibiotics through Sunday on Augmentin .  Levaquin  - CBC with Differential  4. Pyelonephritis Finish Augmentin  and stop Levaquin  Warning signs patient aware to follow-up if fever chills - Basic Metabolic Panel (BMET) - CBC with Differential  5. Generalized abdominal pain She utilizes Bentyl  3 times a day. - dicyclomine  (BENTYL ) 10 MG capsule; 1 taken 3 times daily for abdominal discomforts  Dispense: 180270 capsule; Refill: 1  6. Irritable bowel syndrome with diarrhea See above She has a endoscopy camera coming up in the near future/capsule - dicyclomine  (BENTYL ) 10 MG capsule; 1 taken 3 times daily for abdominal discomforts  Dispense: 180270 capsule; Refill: 1  7. Frequent UTI Referral to urology uncertain if the stones are complicating her UTIs may or may not need suppressing dosing  8. Nephrolithiasis See above

## 2024-10-22 NOTE — Telephone Encounter (Signed)
 Patient 's daughter  Damien had FMLA faxed over to be completed on her for taking care of her mom. In Red folder on your table

## 2024-10-22 NOTE — Patient Instructions (Addendum)
 Please go ahead with lab test today Please do a urine culture somewhere around the 14th through the 20th-that can be done through [facility] here at the office We will set up a consultation with urologist Also send us  glucose readings in approximately 7 to 10 days If you have any additional problems please reach out Use Augmentin -amoxicillin /clavulanate twice daily through Sunday You may utilize dicyclomine  3 times daily as needed to help with abdominal discomforts-new prescription was [facility] pharmacy and was requested to be placed on file Stop Levaquin  Thanks-Dr. Glendia  We will see you at your December visit sooner if any problems

## 2024-10-23 ENCOUNTER — Ambulatory Visit: Payer: Self-pay | Admitting: Family Medicine

## 2024-10-23 ENCOUNTER — Telehealth: Payer: Self-pay | Admitting: *Deleted

## 2024-10-23 ENCOUNTER — Other Ambulatory Visit: Payer: Self-pay | Admitting: Family Medicine

## 2024-10-23 DIAGNOSIS — E876 Hypokalemia: Secondary | ICD-10-CM

## 2024-10-23 LAB — BASIC METABOLIC PANEL WITH GFR
BUN/Creatinine Ratio: 11 — ABNORMAL LOW (ref 12–28)
BUN: 6 mg/dL — ABNORMAL LOW (ref 8–27)
CO2: 24 mmol/L (ref 20–29)
Calcium: 9.2 mg/dL (ref 8.7–10.3)
Chloride: 97 mmol/L (ref 96–106)
Creatinine, Ser: 0.57 mg/dL (ref 0.57–1.00)
Glucose: 263 mg/dL — ABNORMAL HIGH (ref 70–99)
Potassium: 3.4 mmol/L — ABNORMAL LOW (ref 3.5–5.2)
Sodium: 139 mmol/L (ref 134–144)
eGFR: 92 mL/min/1.73 (ref >59)

## 2024-10-23 LAB — CBC WITH DIFFERENTIAL/PLATELET
Basophils Absolute: 0 x10E3/uL (ref 0.0–0.2)
Basos: 0 %
EOS (ABSOLUTE): 0 x10E3/uL (ref 0.0–0.4)
Eos: 0 %
Hematocrit: 39.4 % (ref 34.0–46.6)
Hemoglobin: 12.6 g/dL (ref 11.1–15.9)
Immature Grans (Abs): 0.1 x10E3/uL (ref 0.0–0.1)
Immature Granulocytes: 1 %
Lymphocytes Absolute: 1.5 x10E3/uL (ref 0.7–3.1)
Lymphs: 14 %
MCH: 31.3 pg (ref 26.6–33.0)
MCHC: 32 g/dL (ref 31.5–35.7)
MCV: 98 fL — ABNORMAL HIGH (ref 79–97)
Monocytes Absolute: 0.7 x10E3/uL (ref 0.1–0.9)
Monocytes: 6 %
Neutrophils Absolute: 8.7 x10E3/uL — ABNORMAL HIGH (ref 1.4–7.0)
Neutrophils: 79 %
Platelets: 304 x10E3/uL (ref 150–450)
RBC: 4.02 x10E6/uL (ref 3.77–5.28)
RDW: 14.1 % (ref 11.7–15.4)
WBC: 11.1 x10E3/uL — ABNORMAL HIGH (ref 3.4–10.8)

## 2024-10-23 LAB — MAGNESIUM: Magnesium: 1.8 mg/dL (ref 1.6–2.3)

## 2024-10-23 NOTE — Telephone Encounter (Signed)
 PA for capsule study is being denied. Patient is needing an updated colonoscopy that has been done in the past 12 months. Last colon was 2022. Medical director reviewed and notes received doesn't warrant for capsule study at this time without updated colonoscopy.  Please advise Dr. Eartha thanks!

## 2024-10-23 NOTE — Telephone Encounter (Signed)
 Called and made patient aware. She stated she wants to think about it and will let us  know what she decides.

## 2024-10-25 DIAGNOSIS — K219 Gastro-esophageal reflux disease without esophagitis: Secondary | ICD-10-CM | POA: Diagnosis not present

## 2024-10-25 DIAGNOSIS — E119 Type 2 diabetes mellitus without complications: Secondary | ICD-10-CM | POA: Diagnosis not present

## 2024-10-25 DIAGNOSIS — I2510 Atherosclerotic heart disease of native coronary artery without angina pectoris: Secondary | ICD-10-CM | POA: Diagnosis not present

## 2024-10-25 DIAGNOSIS — F419 Anxiety disorder, unspecified: Secondary | ICD-10-CM | POA: Diagnosis not present

## 2024-10-25 DIAGNOSIS — J309 Allergic rhinitis, unspecified: Secondary | ICD-10-CM | POA: Diagnosis not present

## 2024-10-25 DIAGNOSIS — Z8249 Family history of ischemic heart disease and other diseases of the circulatory system: Secondary | ICD-10-CM | POA: Diagnosis not present

## 2024-10-25 DIAGNOSIS — I10 Essential (primary) hypertension: Secondary | ICD-10-CM | POA: Diagnosis not present

## 2024-10-25 DIAGNOSIS — F324 Major depressive disorder, single episode, in partial remission: Secondary | ICD-10-CM | POA: Diagnosis not present

## 2024-10-25 DIAGNOSIS — E785 Hyperlipidemia, unspecified: Secondary | ICD-10-CM | POA: Diagnosis not present

## 2024-10-26 ENCOUNTER — Encounter: Payer: Self-pay | Admitting: Family Medicine

## 2024-10-29 ENCOUNTER — Telehealth: Payer: Self-pay | Admitting: Family Medicine

## 2024-10-29 NOTE — Telephone Encounter (Signed)
 I spoke with patient regarding her situation Unfortunately still having significant lower abdominal pain not wanting to eat Denies having diarrhea with this  She is open in regards to having the colonoscopy (She request not the week of Thanksgiving) Please go ahead and have your staff reach out to her to schedule  Thanks-Shanikqua Zarzycki

## 2024-10-30 ENCOUNTER — Inpatient Hospital Stay: Payer: Self-pay | Admitting: Family Medicine

## 2024-10-31 ENCOUNTER — Encounter: Payer: Self-pay | Admitting: Family Medicine

## 2024-10-31 ENCOUNTER — Encounter (INDEPENDENT_AMBULATORY_CARE_PROVIDER_SITE_OTHER): Payer: Self-pay | Admitting: Gastroenterology

## 2024-10-31 NOTE — Telephone Encounter (Signed)
 Front staff I filled out the form please contact Christie [name]'s daughter so that she can pick this up Please fill in the administrative portion of her form  Side note-I will be calling the patient around lunchtime on Friday to discuss her pain she is having thank you-patient aware

## 2024-11-02 ENCOUNTER — Other Ambulatory Visit: Payer: Self-pay | Admitting: Family Medicine

## 2024-11-04 ENCOUNTER — Encounter (INDEPENDENT_AMBULATORY_CARE_PROVIDER_SITE_OTHER): Payer: Self-pay | Admitting: *Deleted

## 2024-11-04 ENCOUNTER — Other Ambulatory Visit: Payer: Self-pay | Admitting: Family Medicine

## 2024-11-04 DIAGNOSIS — N390 Urinary tract infection, site not specified: Secondary | ICD-10-CM | POA: Diagnosis not present

## 2024-11-04 DIAGNOSIS — E876 Hypokalemia: Secondary | ICD-10-CM | POA: Diagnosis not present

## 2024-11-04 MED ORDER — HYDROCODONE-ACETAMINOPHEN 5-325 MG PO TABS: ORAL_TABLET | ORAL | 0 refills | Status: AC

## 2024-11-04 MED ORDER — CLENPIQ 10-3.5-12 MG-GM -GM/175ML PO SOLN: 1.0000 | ORAL | Status: DC

## 2024-11-04 MED ORDER — HYDROCODONE-ACETAMINOPHEN 5-325 MG PO TABS: ORAL_TABLET | ORAL | 0 refills | Status: DC

## 2024-11-04 NOTE — Telephone Encounter (Signed)
 Spoke with pt. She has been scheduled as requested for 11/24

## 2024-11-04 NOTE — Addendum Note (Signed)
 Addended by: JEANELL GRAEME RAMAN on: 11/04/2024 10:26 AM   Modules accepted: Orders

## 2024-11-04 NOTE — Telephone Encounter (Addendum)
 PT scheduled for colonoscopy. She is coming by office to pick up sample and instructions.   Medication Samples have been provided to the patient.  Drug name: CLENPIQ       Strength: 10MG /3.5G/12G PER 175MG  BOTTLE        Qty: 1 BOX  LOT: [id number]  Exp.Date: 10/18/2025  The patient has been instructed regarding the correct time, dose, and frequency of taking this medication, including desired effects and most common side effects.    Per [place] for PA Prior authorization is not required for this code.

## 2024-11-05 ENCOUNTER — Ambulatory Visit: Payer: Self-pay | Admitting: Family Medicine

## 2024-11-05 LAB — BASIC METABOLIC PANEL WITH GFR
BUN/Creatinine Ratio: 14 (ref 12–28)
BUN: 10 mg/dL (ref 8–27)
CO2: 25 mmol/L (ref 20–29)
Calcium: 9.9 mg/dL (ref 8.7–10.3)
Chloride: 98 mmol/L (ref 96–106)
Creatinine, Ser: 0.73 mg/dL (ref 0.57–1.00)
Glucose: 374 mg/dL — ABNORMAL HIGH (ref 70–99)
Potassium: 3.5 mmol/L (ref 3.5–5.2)
Sodium: 138 mmol/L (ref 134–144)
eGFR: 83 mL/min/1.73 (ref >59)

## 2024-11-06 ENCOUNTER — Encounter: Payer: Self-pay | Admitting: Family Medicine

## 2024-11-06 ENCOUNTER — Ambulatory Visit: Payer: Self-pay | Admitting: Family Medicine

## 2024-11-06 LAB — URINE CULTURE

## 2024-11-11 ENCOUNTER — Encounter (HOSPITAL_COMMUNITY): Payer: Self-pay | Admitting: Gastroenterology

## 2024-11-11 ENCOUNTER — Ambulatory Visit (HOSPITAL_COMMUNITY)
Admission: RE | Admit: 2024-11-11 | Discharge: 2024-11-11 | Disposition: A | Discharge status: Home / Self Care | Attending: Gastroenterology | Admitting: Gastroenterology

## 2024-11-11 ENCOUNTER — Encounter (HOSPITAL_COMMUNITY)
Admission: RE | Disposition: A | Discharge status: Home / Self Care | Payer: Self-pay | Source: Home / Self Care | Attending: Gastroenterology

## 2024-11-11 ENCOUNTER — Ambulatory Visit (HOSPITAL_COMMUNITY): Admitting: Anesthesiology

## 2024-11-11 ENCOUNTER — Other Ambulatory Visit: Payer: Self-pay

## 2024-11-11 DIAGNOSIS — Z7984 Long term (current) use of oral hypoglycemic drugs: Secondary | ICD-10-CM | POA: Insufficient documentation

## 2024-11-11 DIAGNOSIS — Z7983 Long term (current) use of bisphosphonates: Secondary | ICD-10-CM | POA: Insufficient documentation

## 2024-11-11 DIAGNOSIS — E1165 Type 2 diabetes mellitus with hyperglycemia: Secondary | ICD-10-CM | POA: Diagnosis not present

## 2024-11-11 DIAGNOSIS — R109 Unspecified abdominal pain: Secondary | ICD-10-CM | POA: Diagnosis not present

## 2024-11-11 DIAGNOSIS — Z833 Family history of diabetes mellitus: Secondary | ICD-10-CM | POA: Diagnosis not present

## 2024-11-11 DIAGNOSIS — F32A Depression, unspecified: Secondary | ICD-10-CM | POA: Insufficient documentation

## 2024-11-11 DIAGNOSIS — Z79899 Other long term (current) drug therapy: Secondary | ICD-10-CM | POA: Insufficient documentation

## 2024-11-11 DIAGNOSIS — L290 Pruritus ani: Secondary | ICD-10-CM | POA: Diagnosis not present

## 2024-11-11 DIAGNOSIS — E119 Type 2 diabetes mellitus without complications: Secondary | ICD-10-CM | POA: Diagnosis not present

## 2024-11-11 DIAGNOSIS — I10 Essential (primary) hypertension: Secondary | ICD-10-CM | POA: Diagnosis not present

## 2024-11-11 DIAGNOSIS — E7849 Other hyperlipidemia: Secondary | ICD-10-CM | POA: Insufficient documentation

## 2024-11-11 DIAGNOSIS — R21 Rash and other nonspecific skin eruption: Secondary | ICD-10-CM | POA: Insufficient documentation

## 2024-11-11 DIAGNOSIS — K644 Residual hemorrhoidal skin tags: Secondary | ICD-10-CM | POA: Diagnosis not present

## 2024-11-11 DIAGNOSIS — D122 Benign neoplasm of ascending colon: Secondary | ICD-10-CM | POA: Diagnosis not present

## 2024-11-11 DIAGNOSIS — D123 Benign neoplasm of transverse colon: Secondary | ICD-10-CM | POA: Diagnosis not present

## 2024-11-11 DIAGNOSIS — K635 Polyp of colon: Secondary | ICD-10-CM | POA: Diagnosis not present

## 2024-11-11 HISTORY — PX: COLONOSCOPY: SHX5424

## 2024-11-11 LAB — HM COLONOSCOPY

## 2024-11-11 LAB — GLUCOSE, CAPILLARY
Glucose-Capillary: 316 mg/dL — ABNORMAL HIGH (ref 70–99)
Glucose-Capillary: 194 mg/dL — ABNORMAL HIGH (ref 70–99)

## 2024-11-11 SURGERY — COLONOSCOPY
Anesthesia: Monitor Anesthesia Care

## 2024-11-11 MED ORDER — PROPOFOL 500 MG/50ML IV EMUL
INTRAVENOUS | Status: DC | PRN
  Administered 2024-11-11: 50 mg via INTRAVENOUS
  Administered 2024-11-11: 150 ug/kg/min via INTRAVENOUS

## 2024-11-11 MED ORDER — CILIDINIUM-CHLORDIAZEPOXIDE 2.5-5 MG PO CAPS
1.0000 | ORAL_CAPSULE | Freq: Three times a day (TID) | ORAL | 1 refills | Status: AC
Start: 2024-11-11 — End: ?

## 2024-11-11 MED ORDER — LIDOCAINE 2% (20 MG/ML) 5 ML SYRINGE
INTRAMUSCULAR | Status: DC | PRN
  Administered 2024-11-11: 40 mg via INTRAVENOUS

## 2024-11-11 MED ORDER — LACTATED RINGERS IV SOLN: INTRAVENOUS | Status: DC

## 2024-11-11 MED ORDER — INSULIN ASPART 100 UNIT/ML IJ SOLN
10.0000 [IU] | Freq: Once | INTRAMUSCULAR | Status: AC
  Administered 2024-11-11: 10 [IU] via SUBCUTANEOUS
  Filled 2024-11-11: qty 0.1
  Filled 2024-11-11: qty 1

## 2024-11-11 NOTE — Transfer of Care (Signed)
 Immediate Anesthesia Transfer of Care Note  Patient: Christie [name]  Procedure(s) Performed: COLONOSCOPY  Patient Location: Endoscopy Unit  Anesthesia Type:General  Level of Consciousness: awake, alert , oriented, and drowsy  Airway & Oxygen Therapy: Patient Spontanous Breathing  Post-op Assessment: Report given to RN and Post -op Vital signs reviewed and stable  Post vital signs: Reviewed and stable  Last Vitals:  Vitals Value Taken Time  BP 139/71 11/11/24 10:55  Temp 36.4 C 11/11/24 10:55  Pulse 71 11/11/24 10:55  Resp 20 11/11/24 10:55  SpO2 100 % 11/11/24 10:55    Last Pain:  Vitals:   11/11/24 1055  TempSrc: Oral  PainSc:       Patients Stated Pain Goal: 5 (11/11/24 0839)  Complications: No notable events documented.

## 2024-11-11 NOTE — H&P (Signed)
 Christie [name] is an 78 y.o. female.   Chief Complaint: abdominal pain HPI: 78 year old female with past medical history of vulvar cancer, depression, diabetes, HTN, HLD and IBS-D, who comes for evaluation of abdominal pain.  Patient reports persistent abdominal pain in her abdomen.  Not improving with Bentyl . The patient denies having any nausea, vomiting, fever, chills, hematochezia, melena, hematemesis, abdominal distention, abdominal pain, diarrhea, jaundice, pruritus or weight loss.   Past Medical History:  Diagnosis Date   Cancer [facility]) 1999   external vulva lesion   Depression    DM (diabetes mellitus) (HCC) 2012   oral medications   Elevated liver enzymes    Hearing deficit    Heart murmur    History of kidney stones    Hyperlipidemia    Hyperlipidemia associated with type 2 diabetes mellitus (HCC) 02/25/2020   Hypertension 2000   Spastic colon     Past Surgical History:  Procedure Laterality Date   ANTERIOR LAT LUMBAR FUSION Right 07/04/2017   Procedure: Right LUMBAR TWO-THREE LUMBAR THREE-FOUR LUMBAR FOUR-FIVE Anterior lateral lumbar interbody fusion with percutaneous pedicle screws;  Surgeon: Unice Pac, MD;  Location: [facility] OR;  Service: Neurosurgery;  Laterality: Right;  Right lateral approach    ANTERIOR LATERAL LUMBAR FUSION WITH PERCUTANEOUS SCREW 3 LEVEL N/A 07/04/2017   Procedure: PERCUTANEOUS SCREW THREE LEVEL LUMBAR TWO-THREE LUMBAR THREE-FOUR LUMBAR FOUR-FIVE;  Surgeon: Unice Pac, MD;  Location: [facility] OR;  Service: Neurosurgery;  Laterality: N/A;  Prone   BACK SURGERY  2004 and 2017   BIOPSY  02/23/2021   Procedure: BIOPSY;  Surgeon: Eartha Angelia Sieving, MD;  Location: [facility] SUITE;  Service: Gastroenterology;;   CHOLECYSTECTOMY     COLONOSCOPY  01/2004   COLONOSCOPY N/A 02/13/2014   Procedure: COLONOSCOPY;  Surgeon: Claudis RAYMOND Rivet, MD;  Location: [facility];  Service: Endoscopy;  Laterality: N/A;  930-moved to 100 Ann notified pt   COLONOSCOPY WITH  PROPOFOL  N/A 02/23/2021   Procedure: COLONOSCOPY WITH PROPOFOL ;  Surgeon: Eartha Angelia Sieving, MD;  Location: [facility];  Service: Gastroenterology;  Laterality: N/A;  AM   ESOPHAGOGASTRODUODENOSCOPY  09/2005   ESOPHAGOGASTRODUODENOSCOPY N/A 08/22/2024   Procedure: EGD (ESOPHAGOGASTRODUODENOSCOPY);  Surgeon: Eartha Angelia, Sieving, MD;  Location: [facility];  Service: Gastroenterology;  Laterality: N/A;  8:00 am, asa 1-2   EYE SURGERY Right    catatract removed   EYE SURGERY Right    macular   surgery    KIDNEY STONE SURGERY     for a staghorn kidney stone   POLYPECTOMY  02/23/2021   Procedure: POLYPECTOMY;  Surgeon: Eartha Angelia Sieving, MD;  Location: [facility];  Service: Gastroenterology;;   STAPEDECTOMY Bilateral    Right 1991 and Left 1994   TOTAL ABDOMINAL HYSTERECTOMY  1982   vulva cancer surgery  1999    Family History  Problem Relation Age of Onset   Diabetes Father    Hypertension Father    Dementia Father    Pancreatic cancer Mother    Social History:  reports that she has never smoked. She has never used smokeless tobacco. She reports that she does not drink alcohol and does not use drugs.  Allergies:  Allergies  Allergen Reactions   Ace Inhibitors Other (See Comments)    Unknown    Atorvastatin Other (See Comments)    Elevated LFT's Liver enzymes go up   Gabapentin  Other (See Comments)    Unknown    Metformin And Related Other (See Comments)  Reaction is a side effect   Oxycodone-Acetaminophen  Other (See Comments)    Unknown     Medications Prior to Admission  Medication Sig Dispense Refill   ALPRAZolam  (XANAX ) 0.5 MG tablet TAKE 1 TABLET BY MOUTH AT BEDTIME. 30 tablet 5   amLODipine  (NORVASC ) 10 MG tablet Take 1 tablet (10 mg total) by mouth daily. 90 tablet 1   cholecalciferol  (VITAMIN D ) 1000 UNITS tablet Take 1,000 Units by mouth daily.     dicyclomine  (BENTYL ) 10 MG capsule 1 taken 3 times daily for abdominal discomforts  180270 capsule 1   Elderberry-Vitamin C -Zinc (ELDERBERRY EXTRACT PO) Take by mouth. One tablespoon per day.     glipiZIDE  (GLUCOTROL ) 5 MG tablet 2 in the am 2 in the pm 360 tablet 1   Multiple Vitamins-Minerals (MACULAR VITAMIN BENEFIT PO) Take 1 tablet by mouth in the morning and at bedtime.     omeprazole  (PRILOSEC) 40 MG capsule Take 1 capsule (40 mg total) by mouth daily. 90 capsule 3   OVER THE COUNTER MEDICATION IB Guard BID     potassium chloride  (KLOR-CON  M) 10 MEQ tablet Take 1 tablet (10 mEq total) by mouth daily. 90 tablet 1   rosuvastatin  (CRESTOR ) 40 MG tablet Take 1 tablet (40 mg total) by mouth daily. 90 tablet 1   sertraline  (ZOLOFT ) 50 MG tablet TAKE (1) TABLET BY MOUTH ONCE DAILY. 90 tablet 1   valsartan  (DIOVAN ) 160 MG tablet TAKE ONE TABLET BY MOUTH TWICE DAILY 90 tablet 3   zinc gluconate 50 MG tablet Take 50 mg by mouth daily.     acetaminophen  (TYLENOL ) 500 MG tablet Take 1,000 mg by mouth every 6 (six) hours as needed (pain).     alendronate  (FOSAMAX ) 70 MG tablet Take 1 tablet (70 mg total) by mouth once a week. Take with a full glass of water  on an empty stomach. (Patient not taking: Reported on 10/21/2024) 12 tablet 3   Ascorbic Acid  (VITAMIN C ) 1000 MG tablet Take 1,000 mg by mouth daily.     fluticasone  (FLONASE ) 50 MCG/ACT nasal spray Place 2 sprays into both nostrils daily. 1 g 0   HYDROcodone -acetaminophen  (NORCO/VICODIN) 5-325 MG tablet 1/2 to 1 q 6 hours prn pain 20 tablet 0   ondansetron  (ZOFRAN ) 4 MG tablet Take 1 tablet (4 mg total) by mouth every 8 (eight) hours as needed for nausea or vomiting. 30 tablet 1   Sod Picosulfate-Mag Ox-Cit Acd (CLENPIQ ) 10-3.5-12 MG-GM -GM/175ML SOLN Take 1 kit by mouth as directed.      Results for orders placed or performed during the hospital encounter of 11/11/24 (from the past 48 hours)  Glucose, capillary     Status: Abnormal   Collection Time: 11/11/24  8:34 AM  Result Value Ref Range   Glucose-Capillary 316 (H) 70 -  99 mg/dL    Comment: Glucose reference range applies only to samples taken after fasting for at least 8 hours.   No results found.  Review of Systems  Gastrointestinal:  Positive for abdominal pain.  All other systems reviewed and are negative.   Blood pressure (!) 167/78, pulse 87, temperature 98.2 F (36.8 C), temperature source Oral, resp. rate (!) 23, SpO2 97%. Physical Exam  GENERAL: The patient is AO x3, in no acute distress. HEENT: Head is normocephalic and atraumatic. EOMI are intact. Mouth is well hydrated and without lesions. NECK: Supple. No masses LUNGS: Clear to auscultation. No presence of rhonchi/wheezing/rales. Adequate chest expansion HEART: RRR, normal s1 and s2.  ABDOMEN: Soft, nontender, no guarding, no peritoneal signs, and nondistended. BS +. No masses. EXTREMITIES: Without any cyanosis, clubbing, rash, lesions or edema. NEUROLOGIC: AOx3, no focal motor deficit. SKIN: no jaundice, no rashes  Assessment/Plan 78 year old female with past medical history of vulvar cancer, depression, diabetes, HTN, HLD and IBS-D, who comes for evaluation of abdominal pain.  Will proceed with colonoscopy.  Toribio Eartha Flavors, MD 11/11/2024, 9:26 AM

## 2024-11-11 NOTE — Anesthesia Preprocedure Evaluation (Addendum)
 Anesthesia Evaluation  Patient identified by MRN, date of birth, ID band Patient awake    Reviewed: Allergy & Precautions, H&P , NPO status , Patient's Chart, lab work & pertinent test results, reviewed documented beta blocker date and time   Airway Mallampati: II  TM Distance: >3 FB Neck ROM: full    Dental no notable dental hx.    Pulmonary neg pulmonary ROS   Pulmonary exam normal breath sounds clear to auscultation       Cardiovascular Exercise Tolerance: Good hypertension, + Valvular Problems/Murmurs  Rhythm:regular Rate:Normal     Neuro/Psych  PSYCHIATRIC DISORDERS  Depression    negative neurological ROS     GI/Hepatic negative GI ROS, Neg liver ROS,,,  Endo/Other  diabetes, Poorly Controlled    Renal/GU Renal disease  negative genitourinary   Musculoskeletal   Abdominal   Peds  Hematology negative hematology ROS (+)   Anesthesia Other Findings   Reproductive/Obstetrics negative OB ROS                              Anesthesia Physical Anesthesia Plan  ASA: 3  Anesthesia Plan: MAC   Post-op Pain Management:    Induction:   PONV Risk Score and Plan: Propofol  infusion  Airway Management Planned:   Additional Equipment:   Intra-op Plan:   Post-operative Plan:   Informed Consent: I have reviewed the patients History and Physical, chart, labs and discussed the procedure including the risks, benefits and alternatives for the proposed anesthesia with the patient or authorized representative who has indicated his/her understanding and acceptance.     Dental Advisory Given  Plan Discussed with: CRNA  Anesthesia Plan Comments:          Anesthesia Quick Evaluation

## 2024-11-11 NOTE — Op Note (Signed)
 [facility] Patient Name: Christie [name] Procedure Date: 11/11/2024 10:18 AM MRN: [id number] Date of Birth: 1946-11-01 Attending MD: Toribio Fortune , , [id number] CSN: [id number] Age: 78 Admit Type: Outpatient Procedure:                Colonoscopy Indications:              Abdominal pain Providers:                Toribio Fortune, Madelin Hunter, RN, Chad Wilson,                            Technician Referring MD:              Medicines:                Monitored Anesthesia Care Complications:            No immediate complications. Estimated Blood Loss:     Estimated blood loss: none. Procedure:                Pre-Anesthesia Assessment:                           - Prior to the procedure, a History and Physical                            was performed, and patient medications, allergies                            and sensitivities were reviewed. The patient's                            tolerance of previous anesthesia was reviewed.                           - The risks and benefits of the procedure and the                            sedation options and risks were discussed with the                            patient. All questions were answered and informed                            consent was obtained.                           - ASA Grade Assessment: II - A patient with mild                            systemic disease.                           After obtaining informed consent, the colonoscope                            was passed under direct vision. Throughout the  procedure, the patient's blood pressure, pulse, and                            oxygen saturations were monitored continuously. The                            [id number]-[id number] ([id number]) Peds Colon was introduced                            through the anus and advanced to the the cecum,                            identified by appendiceal orifice and ileocecal                            valve. The  colonoscopy was performed without                            difficulty. The patient tolerated the procedure                            well. The quality of the bowel preparation was good. Scope In: 10:35:59 AM Scope Out: 10:51:43 AM Scope Withdrawal Time: 0 hours 9 minutes 50 seconds  Total Procedure Duration: 0 hours 15 minutes 44 seconds  Findings:      Two sessile polyps were found in the transverse colon and ascending       colon. The polyps were 3 to 4 mm in size. These polyps were removed with       a cold snare. Resection and retrieval were complete.      The perianal exam findings include a perianal rash.      Non-bleeding external hemorrhoids were found during retroflexion. The       hemorrhoids were medium-sized. Impression:               - Two 3 to 4 mm polyps in the transverse colon and                            in the ascending colon, removed with a cold snare.                            Resected and retrieved.                           - Perianal rash found on perianal exam.                           - Non-bleeding external hemorrhoids. Moderate Sedation:      Per Anesthesia Care Recommendation:           - Discharge patient to home (ambulatory).                           - Resume previous diet.                           -  Await pathology results.                           - Repeat colonoscopy is not recommended due to                            current age (108 years or older) for screening                            purposes.                           - Use Desitin cream for perianal rash.                           - Start Librax three times a day for abdominal                            pain. Do not take Xanax  or dicyclomine  while taking                            this medication.                           - Schedule capsule endoscopy.                           - Will discuss with Dr. Alphonsa possibility of                            switching SSRI to Remeron if  symptoms persists and                            negative workup. Procedure Code(s):        --- Professional ---                           [phone], Colonoscopy, flexible; with removal of                            tumor(s), polyp(s), or other lesion(s) by snare                            technique Diagnosis Code(s):        --- Professional ---                           D12.3, Benign neoplasm of transverse colon (hepatic                            flexure or splenic flexure)                           D12.2, Benign neoplasm of ascending colon  R21, Rash and other nonspecific skin eruption                           K64.4, Residual hemorrhoidal skin tags                           R10.9, Unspecified abdominal pain CPT copyright 2022 American Medical Association. All rights reserved. The codes documented in this report are preliminary and upon coder review may  be revised to meet current compliance requirements. Toribio Fortune, MD Toribio Fortune,  11/11/2024 10:59:19 AM This report has been signed electronically. Number of Addenda: 0

## 2024-11-11 NOTE — Discharge Instructions (Signed)
 You are being discharged to home.  Resume your previous diet.  We are waiting for your pathology results.  Your physician has indicated that a repeat colonoscopy is not recommended due to your current age (62 years or older) for screening purposes.  Use Desitin cream for perianal rash. Start Librax three times a day for abdominal pain. Do not take Xanax  or dicyclomine  while taking this medication. Schedule capsule endoscopy.

## 2024-11-12 ENCOUNTER — Telehealth (INDEPENDENT_AMBULATORY_CARE_PROVIDER_SITE_OTHER): Payer: Self-pay | Admitting: *Deleted

## 2024-11-12 ENCOUNTER — Ambulatory Visit (INDEPENDENT_AMBULATORY_CARE_PROVIDER_SITE_OTHER): Payer: Self-pay | Admitting: Gastroenterology

## 2024-11-12 ENCOUNTER — Encounter (INDEPENDENT_AMBULATORY_CARE_PROVIDER_SITE_OTHER): Payer: Self-pay | Admitting: *Deleted

## 2024-11-12 LAB — SURGICAL PATHOLOGY

## 2024-11-12 NOTE — Telephone Encounter (Signed)
 PA on [employer] for capsule endoscopy: Status: In Progress Transaction ID: [id number] Payer Record Number: [id number] From Date: 11/12/2024 To Date: 12/12/2024 Quantity Type: Units Quantity: 1 In Progress - [employer] requires clinical review for this service; clinical records are required. Please continue with your submission.

## 2024-11-12 NOTE — Telephone Encounter (Signed)
Per TCS op note - Schedule capsule endoscopy 

## 2024-11-13 NOTE — Progress Notes (Signed)
 Patient result letter mailed Patient's PCP is on EPIC

## 2024-11-15 NOTE — Anesthesia Postprocedure Evaluation (Signed)
 Anesthesia Post Note  Patient: Christie [name]  Procedure(s) Performed: COLONOSCOPY  Patient location during evaluation: Phase II Anesthesia Type: MAC Level of consciousness: awake Pain management: pain level controlled Vital Signs Assessment: post-procedure vital signs reviewed and stable Respiratory status: spontaneous breathing and respiratory function stable Cardiovascular status: blood pressure returned to baseline and stable Postop Assessment: no headache and no apparent nausea or vomiting Anesthetic complications: no Comments: Late entry   No notable events documented.   Last Vitals:  Vitals:   11/11/24 1055 11/11/24 1101  BP: 139/71 (!) 146/79  Pulse: 71 78  Resp: 20 20  Temp: 36.4 C   SpO2: 100% 98%    Last Pain:  Vitals:   11/11/24 1101  TempSrc:   PainSc: 8                  Yvonna JINNY Bosworth

## 2024-11-18 ENCOUNTER — Encounter (HOSPITAL_COMMUNITY): Payer: Self-pay | Admitting: Gastroenterology

## 2024-11-18 NOTE — Telephone Encounter (Signed)
 I have placed the following paperwork on your desk:  [employer]  Request for Dr. Eartha The medical director would like to offer you a peer-to-peer conversation prior. Call back at [phone] by 11/19/2024 at 1:00pm.

## 2024-11-18 NOTE — Telephone Encounter (Signed)
 I called the number listed in the [facility] paperwork.  They will be calling me back within the next 24 hours to schedule a peer to peer discussion. Thanks

## 2024-11-19 NOTE — Telephone Encounter (Signed)
 I received a call today from [facility] representative regarding Christie [name]'s capsule endoscopy.  The representative did not want to identify herself with her name or title, but provided a reference number - [id number] (she reported that she was a provider capable of determining approval based on Medicare guidelines).  She reported that based on the criteria of the insurance, the patient was not going to be approved for her capsule endoscopy as she did not have any inflammation evidence on imaging in the past.  I explained to her that the patient had presented significant weight loss and had required previous visits to the hospital which is very concerning, but the representative stated that she could not approve this as it was not part of the criteria for approval by her insurance.  She stated that we can appeal the decision of [employer] following the instructions provided in the rejection letter.  We will appeal this decision again.  RICK Hamilton

## 2024-11-19 NOTE — Telephone Encounter (Signed)
Appreciate the feedback.  

## 2024-11-20 ENCOUNTER — Encounter (INDEPENDENT_AMBULATORY_CARE_PROVIDER_SITE_OTHER): Admitting: Ophthalmology

## 2024-11-25 ENCOUNTER — Encounter (INDEPENDENT_AMBULATORY_CARE_PROVIDER_SITE_OTHER): Payer: Self-pay | Admitting: Gastroenterology

## 2024-11-25 ENCOUNTER — Encounter: Payer: Self-pay | Admitting: Family Medicine

## 2024-11-26 ENCOUNTER — Telehealth (INDEPENDENT_AMBULATORY_CARE_PROVIDER_SITE_OTHER): Payer: Self-pay

## 2024-11-26 NOTE — Telephone Encounter (Signed)
 This message was sent via FAXCOM, a product from [employer]. [email or web address].biscom.com/                    -------Fax Transmission Report-------  To:               Recipient at [phone] Subject:          Appeal letter for Case [id number] [facility] Result:           The transmission was successful. Explanation:      All Pages Ok Pages Sent:       6 Connect Time:     2 minutes, 26 seconds Transmit Time:    11/26/2024 16:33 Transfer Rate:    14400 Status Code:      0000 Retry Count:      0 Job Id:           [id number] Unique Id:        [id number] Fax Line:         65 Fax Server:       [id number]      I have faxed the information to the insurance company per Dr. Eartha.

## 2024-11-27 ENCOUNTER — Telehealth: Payer: Self-pay

## 2024-11-27 NOTE — Telephone Encounter (Signed)
 Christy from [employer] says they have all the appeal information and are processing it, and the listing is under expedited appeals with the following Reference # [id number].

## 2024-11-27 NOTE — Telephone Encounter (Signed)
 I called the number of [phone] Spoke with Christie [name] DEL Reference # [id number] she says the Appeal for the peer to peer from 11/26/2024 is still pending and will not be ready until 12/02/2024. When I asked to expedite. I was transferred To Christie [name], Who stayed on the line with me as she transferred me to Christie [name], who then transferred me to Christie [name], who then transferred me to Christie [name], who transferred me to Christie [name], as which time this call was dropped, and Christie [name] who had been on the call with me the whole time finally gave me another number of [phone], Which I called and spoke with Christie [name], whom had me on hold. The entirety of this call was over 45 min. So I could not ask for a expedite.

## 2024-11-27 NOTE — Telephone Encounter (Signed)
 I spoke with Christie [name] patient daughter (on [place]) made her aware we are still awaiting a determination from the Peer to Peer Dr. Eartha did on 11/26/2024. She states understanding, and says she appreciates all of our work. I made her aware we would make them aware of the determination, once it is back.

## 2024-11-27 NOTE — Telephone Encounter (Signed)
 Return call to pt. Pt state's someone called her but not sure who due she not able to understanding the voiced message that was left. Pt is aware I will follow Practice Administrator on referral. Voiced understanding

## 2024-11-27 NOTE — Telephone Encounter (Signed)
 Thanks for your hard work.  Can you please let the patient's daughter know about all you went through? Just to keep her updated. Thanks

## 2024-12-02 ENCOUNTER — Ambulatory Visit: Admitting: Family Medicine

## 2024-12-02 ENCOUNTER — Encounter: Payer: Self-pay | Admitting: Family Medicine

## 2024-12-02 ENCOUNTER — Other Ambulatory Visit: Payer: Self-pay | Admitting: Family Medicine

## 2024-12-02 VITALS — BP 122/69 | Ht 63.0 in | Wt 138.0 lb

## 2024-12-02 DIAGNOSIS — N390 Urinary tract infection, site not specified: Secondary | ICD-10-CM

## 2024-12-02 DIAGNOSIS — Z0000 Encounter for general adult medical examination without abnormal findings: Secondary | ICD-10-CM

## 2024-12-02 DIAGNOSIS — R1084 Generalized abdominal pain: Secondary | ICD-10-CM

## 2024-12-02 DIAGNOSIS — E119 Type 2 diabetes mellitus without complications: Secondary | ICD-10-CM

## 2024-12-02 DIAGNOSIS — R634 Abnormal weight loss: Secondary | ICD-10-CM

## 2024-12-02 DIAGNOSIS — Z0001 Encounter for general adult medical examination with abnormal findings: Secondary | ICD-10-CM

## 2024-12-02 NOTE — Progress Notes (Addendum)
 Subjective:    Patient ID: Christie [name], female    DOB: [birth year], 78 y.o.   MRN: [id number]  HPI The patient comes in today for a wellness visit.    A review of their health history was completed.  A review of medications was also completed.  Any needed refills; will update refills  Eating habits: Patient unable to eat much because she has chronic reoccurring abdominal pain in the lower abdomen history of present past several months she has had significant weight loss with She has had a EGD, colonoscopy, CAT scans without revealing a specific cause, GI trying to get the endoscopy capsule approved but have a difficult time with insurance guidelines Patient has strong family history of cancer-patient has a history of external vulvar cancer treated back in 1999. She had a total abdominal hysterectomy greater than 25 years ago   Falls/  MVA accidents in past few months: No accidents or injuries  Regular exercise: Does try to fit a little bit of walking around the house  Specialist pt sees on regular basis: GI  Preventative health issues were discussed.   Additional concerns: Abdominal pain and weight loss  Has noted that she has lost 10 more pounds. Discussed the use of AI scribe software for clinical note transcription with the patient, who gave verbal consent to proceed.  History of Present Illness   Christie [name] is an 78 year old female who presents with abdominal pain and weight loss.  She experiences abdominal pain primarily in the lower abdomen, which sometimes is felt a little higher and occasionally around the umbilical region. The pain persists for about four to five hours daily. Heat and Advil provide some relief, although the pain often remains unaffected by these measures. No specific foods have been identified that exacerbate the pain.  She reports a decreased appetite, stating 'nothing sounds good,' and has experienced a weight loss of approximately ten pounds  recently. Despite attempts to modify her diet by avoiding beef and pork, she continues to struggle with knowing what to eat. She mentions enjoying fish on occasion but often finds herself unsure of what she wants to eat.  Her medical history includes back surgery and recurrent urinary tract infections. She experiences back pain, which she attributes to muscular issues, as heat and rubbing provide relief. She has undergone various diagnostic procedures, including colonoscopy, endoscopy, and CT scans, which have not revealed any tumors. She is currently taking Librax twice a day and Xanax  at night, and she has previously been prescribed dicyclomine .  Family history is significant for cancer, which raises her concern about her symptoms. She has undergone a hysterectomy in the past, during which both ovaries were removed.      Review of Systems     Objective:   Physical Exam  General-in no acute distress Eyes-no discharge Lungs-respiratory rate normal, CTA CV-no murmurs,RRR Extremities skin warm dry no edema Neuro grossly normal Behavior normal, alert Breast exam bilateral normal Pelvic exam no masses felt hysterectomy noted patient has benign-appearing papilla in the superior vaginal canal this has been stable for years  Assessment and Plan    Chronic abdominal pain with unintentional weight loss Chronic abdominal pain with weight loss, negative imaging for tumors. Differential includes occult cancer. Pending capsule study approval. Possible alpha-gal syndrome considered. - Ordered alpha-gal blood test. - Referred to urology for cystoscopy. - Referred to hematology/oncology. - Continue efforts for capsule study approval. - Repeated blood work: A1c, kidney function, liver function,  CBC.  Type 2 diabetes mellitus Type 2 diabetes managed with medication. - Repeated A1c test.  Recurrent urinary tract infections CT scan suggests bladder inflammation. Urology referral planned. -  Referred to urology for evaluation.          Assessment & Plan:  1. Well adult exam (Primary) Adult wellness-complete.wellness physical was conducted today. Importance of diet and exercise were discussed in detail.  Importance of stress reduction and healthy living were discussed.  In addition to this a discussion regarding safety was also covered.  We also reviewed over immunizations and gave recommendations regarding current immunization needed for age.   In addition to this additional areas were also touched on including: Preventative health exams needed:  Colonoscopy just recently  Patient was advised yearly wellness exam   2. Diabetes mellitus without complication (HCC) Check A1c previous numbers look good healthy diet - Hemoglobin A1c - Basic metabolic panel with GFR  3. Generalized abdominal pain Check lab work. In my opinion I believe patient would benefit from possibly the medication GI was recommending but we will check labs first - Alpha-Gal Panel - CBC with Differential/Platelet - Hepatic function panel - Lipase  4. Weight loss Await lab work more than likely refer patient to hematology oncology for further evaluation to make sure we are not overlooking the possibility of an occult cancer - Alpha-Gal Panel - CBC with Differential/Platelet - Lipase  5. Frequent UTI Abnormal CT scan previously just recently plus also frequent UTIs and a hospitalization for pyelonephritis consultation with urology patient may well benefit from a cystoscope to make sure that there is not underlying cancer within the bladder

## 2024-12-03 ENCOUNTER — Telehealth (INDEPENDENT_AMBULATORY_CARE_PROVIDER_SITE_OTHER): Payer: Self-pay

## 2024-12-03 NOTE — Telephone Encounter (Signed)
 CORRECTION: I had discussed with Crystal and the insurance approved the Capsule Endoscopy (not a CT Angio). Thanks

## 2024-12-03 NOTE — Telephone Encounter (Signed)
 Spoke with patient, scheduled capsule endoscopy for 12/06/2024 at 7:30am. Instructions sent to mychart.

## 2024-12-03 NOTE — Telephone Encounter (Signed)
 Patient insurance called and left a voice mail that procedure has been approved for the Ct Angio. If any question to call [phone].

## 2024-12-03 NOTE — Telephone Encounter (Signed)
 Thanks Crystal, please notify the daughter via MyChart. FYI Scott.  Autumn, please schedule the capsule endoscopy.  Thanks!

## 2024-12-03 NOTE — Telephone Encounter (Signed)
 Thank you :)

## 2024-12-04 ENCOUNTER — Telehealth: Payer: Self-pay

## 2024-12-04 NOTE — Telephone Encounter (Signed)
-----   Message from Glendia Fielding, MD sent at 12/02/2024 12:25 PM EST ----- Hi-this patient was previously referred to urology due to frequent UTIs abnormal CAT scan and weight loss.  I believe she would benefit from a urology consult and possible cystoscope.  Please have your front office assist her at getting an appointment-referral was made back in early November-thank you-[name]

## 2024-12-04 NOTE — Telephone Encounter (Signed)
 Pt is scheduled for appointment

## 2024-12-05 ENCOUNTER — Ambulatory Visit: Payer: Self-pay | Admitting: Family Medicine

## 2024-12-05 ENCOUNTER — Other Ambulatory Visit: Payer: Self-pay | Admitting: Family Medicine

## 2024-12-05 LAB — ALPHA-GAL PANEL
Allergen Lamb IgE: <0.1 kU/L
Beef IgE: <0.1 kU/L
IgE (Immunoglobulin E), Serum: 789 [IU]/mL — AB (ref 6–495)
O215-IgE Alpha-Gal: <0.1 kU/L
Pork IgE: <0.1 kU/L

## 2024-12-05 LAB — CBC WITH DIFFERENTIAL/PLATELET
Basophils Absolute: 0.1 x10E3/uL (ref 0.0–0.2)
Basos: 1 %
EOS (ABSOLUTE): 0.1 x10E3/uL (ref 0.0–0.4)
Eos: 1 %
Hematocrit: 42 % (ref 34.0–46.6)
Hemoglobin: 13.9 g/dL (ref 11.1–15.9)
Immature Grans (Abs): 0 x10E3/uL (ref 0.0–0.1)
Immature Granulocytes: 0 %
Lymphocytes Absolute: 1.7 x10E3/uL (ref 0.7–3.1)
Lymphs: 18 %
MCH: 32 pg (ref 26.6–33.0)
MCHC: 33.1 g/dL (ref 31.5–35.7)
MCV: 97 fL (ref 79–97)
Monocytes Absolute: 0.6 x10E3/uL (ref 0.1–0.9)
Monocytes: 6 %
Neutrophils Absolute: 7.1 x10E3/uL — ABNORMAL HIGH (ref 1.4–7.0)
Neutrophils: 74 %
Platelets: 233 x10E3/uL (ref 150–450)
RBC: 4.34 x10E6/uL (ref 3.77–5.28)
RDW: 13 % (ref 11.7–15.4)
WBC: 9.5 x10E3/uL (ref 3.4–10.8)

## 2024-12-05 LAB — HEPATIC FUNCTION PANEL
ALT: 14 IU/L (ref 0–32)
AST: 17 IU/L (ref 0–40)
Albumin: 4.5 g/dL (ref 3.8–4.8)
Alkaline Phosphatase: 80 IU/L (ref 49–135)
Bilirubin Total: 0.5 mg/dL (ref 0.0–1.2)
Bilirubin, Direct: 0.21 mg/dL (ref 0.00–0.40)
Total Protein: 6.4 g/dL (ref 6.0–8.5)

## 2024-12-05 LAB — BASIC METABOLIC PANEL WITH GFR
BUN/Creatinine Ratio: 15 (ref 12–28)
BUN: 10 mg/dL (ref 8–27)
CO2: 22 mmol/L (ref 20–29)
Calcium: 10.2 mg/dL (ref 8.7–10.3)
Chloride: 98 mmol/L (ref 96–106)
Creatinine, Ser: 0.66 mg/dL (ref 0.57–1.00)
Glucose: 259 mg/dL — ABNORMAL HIGH (ref 70–99)
Potassium: 3.4 mmol/L — ABNORMAL LOW (ref 3.5–5.2)
Sodium: 138 mmol/L (ref 134–144)
eGFR: 89 mL/min/1.73 (ref >59)

## 2024-12-05 LAB — HEMOGLOBIN A1C
Est. average glucose Bld gHb Est-mCnc: 206 mg/dL
Hgb A1c MFr Bld: 8.8 % — ABNORMAL HIGH (ref 4.8–5.6)

## 2024-12-05 LAB — LIPASE: Lipase: 37 U/L (ref 14–85)

## 2024-12-05 MED ORDER — POTASSIUM CHLORIDE CRYS ER 10 MEQ PO TBCR: 10.0000 meq | EXTENDED_RELEASE_TABLET | Freq: Two times a day (BID) | ORAL | 1 refills | Status: AC

## 2024-12-05 NOTE — Telephone Encounter (Signed)
 I spoke with patient we await the results of this Appreciate GIs input

## 2024-12-06 ENCOUNTER — Ambulatory Visit (HOSPITAL_COMMUNITY)
Admission: RE | Admit: 2024-12-06 | Discharge: 2024-12-06 | Disposition: A | Discharge status: Home / Self Care | Attending: Gastroenterology | Admitting: Gastroenterology

## 2024-12-06 ENCOUNTER — Encounter (HOSPITAL_COMMUNITY): Admission: RE | Disposition: A | Payer: Self-pay | Source: Home / Self Care | Attending: Gastroenterology

## 2024-12-06 ENCOUNTER — Encounter (HOSPITAL_COMMUNITY): Payer: Self-pay | Admitting: Gastroenterology

## 2024-12-06 DIAGNOSIS — R109 Unspecified abdominal pain: Secondary | ICD-10-CM | POA: Insufficient documentation

## 2024-12-06 DIAGNOSIS — R634 Abnormal weight loss: Secondary | ICD-10-CM | POA: Insufficient documentation

## 2024-12-06 DIAGNOSIS — G8929 Other chronic pain: Secondary | ICD-10-CM | POA: Diagnosis not present

## 2024-12-06 HISTORY — PX: GIVENS CAPSULE STUDY: SHX5432

## 2024-12-06 SURGERY — IMAGING PROCEDURE, GI TRACT, INTRALUMINAL, VIA CAPSULE

## 2024-12-09 ENCOUNTER — Encounter (HOSPITAL_COMMUNITY): Payer: Self-pay | Admitting: Gastroenterology

## 2024-12-09 ENCOUNTER — Other Ambulatory Visit (INDEPENDENT_AMBULATORY_CARE_PROVIDER_SITE_OTHER): Payer: Self-pay | Admitting: Gastroenterology

## 2024-12-09 DIAGNOSIS — G8929 Other chronic pain: Secondary | ICD-10-CM

## 2024-12-09 MED ORDER — MIRTAZAPINE 15 MG PO TABS: 7.5000 mg | ORAL_TABLET | Freq: Every day | ORAL | 0 refills | Status: DC

## 2024-12-09 NOTE — Procedures (Signed)
 Small Bowel Givens Capsule Study Procedure date: 12/06/2024  Referring Provider:  PCP PCP:  Dr. Alphonsa [name], MD  Indication for procedure:  chronic abdominal pain, weight loss  Findings:  Study was adequate as capsule reached the cecum and the bowel preparation was adequate in the small bowel. No abnormalities were found in the gastrointestinal lining.     Summary & Recommendations: Normal capsule endoscopy. Unclear etiology of abdominal pain. Bowel hypersensitivity remains high in the differential.  Notably, IgE levels were elevated and PCP has ordered ova and parasite in stool.  Agree with this.  We will start her on Remeron  7.5 mg every night for management of abdominal pain.  I will reach her PCP to discuss comanaging her psychiatric medications if she is taking Zoloft  and may need to lower this medication.  I personally communicated these recommendations to the patient and daughter.  Toribio Fortune, MD Gastroenterology and Hepatology [facility]

## 2024-12-09 NOTE — Telephone Encounter (Signed)
 Nurses I read through the message Elevated IgE can sometimes be associated with parasite infection of the intestine Sometimes can be associated with allergies I would recommend the following 1.  It would be reasonable to do a stool test for parasites O&P-please have the patient do O&P x 1  #2-if that test is negative a consultation with allergist is reasonable but may not necessarily identify anything  Patient had capsule study he could take a fair amount of time before gastroenterology has the results of this then that may provide additional clues Thanks-Dr. Glendia

## 2024-12-10 ENCOUNTER — Other Ambulatory Visit: Payer: Self-pay

## 2024-12-10 DIAGNOSIS — R7689 Other specified abnormal immunological findings in serum: Secondary | ICD-10-CM

## 2024-12-20 LAB — OVA AND PARASITE EXAMINATION

## 2024-12-21 ENCOUNTER — Ambulatory Visit: Payer: Self-pay | Admitting: Family Medicine

## 2024-12-23 ENCOUNTER — Ambulatory Visit: Payer: Self-pay | Admitting: Family Medicine

## 2024-12-23 ENCOUNTER — Telehealth: Payer: Self-pay | Admitting: Family Medicine

## 2024-12-23 ENCOUNTER — Ambulatory Visit (HOSPITAL_COMMUNITY)
Admission: RE | Admit: 2024-12-23 | Discharge: 2024-12-23 | Disposition: A | Discharge status: Home / Self Care | Source: Ambulatory Visit | Attending: Family Medicine | Admitting: Family Medicine

## 2024-12-23 ENCOUNTER — Other Ambulatory Visit: Payer: Self-pay

## 2024-12-23 ENCOUNTER — Other Ambulatory Visit: Payer: Self-pay | Admitting: Family Medicine

## 2024-12-23 DIAGNOSIS — R109 Unspecified abdominal pain: Secondary | ICD-10-CM | POA: Diagnosis present

## 2024-12-23 DIAGNOSIS — M545 Low back pain, unspecified: Secondary | ICD-10-CM

## 2024-12-23 DIAGNOSIS — R634 Abnormal weight loss: Secondary | ICD-10-CM | POA: Insufficient documentation

## 2024-12-23 DIAGNOSIS — G8929 Other chronic pain: Secondary | ICD-10-CM | POA: Insufficient documentation

## 2024-12-23 DIAGNOSIS — M546 Pain in thoracic spine: Secondary | ICD-10-CM | POA: Diagnosis present

## 2024-12-23 NOTE — Telephone Encounter (Signed)
 Nurses Patient is aware I spoke with them today Please put in consultation for hematology oncology due to abdominal pain and weight loss with a history of uterine cancer  Hopefully they can see her within the next 3 to 4 weeks thank you

## 2024-12-24 ENCOUNTER — Ambulatory Visit (INDEPENDENT_AMBULATORY_CARE_PROVIDER_SITE_OTHER)

## 2024-12-24 ENCOUNTER — Other Ambulatory Visit: Payer: Self-pay

## 2024-12-24 VITALS — BP 107/65 | HR 91 | Temp 97.5°F | Ht 63.0 in | Wt 129.0 lb

## 2024-12-24 DIAGNOSIS — E1169 Type 2 diabetes mellitus with other specified complication: Secondary | ICD-10-CM | POA: Diagnosis not present

## 2024-12-24 DIAGNOSIS — E785 Hyperlipidemia, unspecified: Secondary | ICD-10-CM | POA: Diagnosis not present

## 2024-12-24 DIAGNOSIS — R109 Unspecified abdominal pain: Secondary | ICD-10-CM

## 2024-12-24 DIAGNOSIS — E876 Hypokalemia: Secondary | ICD-10-CM

## 2024-12-24 DIAGNOSIS — R634 Abnormal weight loss: Secondary | ICD-10-CM

## 2024-12-24 MED ORDER — MELOXICAM 7.5 MG PO TABS: 7.5000 mg | ORAL_TABLET | Freq: Every day | ORAL | 0 refills | Status: AC

## 2024-12-24 NOTE — Progress Notes (Signed)
 "  Acute Office Visit  Subjective:     Patient ID: Christie [name], female    DOB: [birth year], 79 y.o.   MRN: [id number]  Chief Complaint  Patient presents with   GI issues follow up     Abd pains going on since July 2025    HPI Patient is in today for abdominal pain, decreased appetite and weight loss that has occurred for 6 months. She has had a 50 lb weight loss. Describes abdominal pain as intermittent and dull, and is mainly in the lower abdomen. Does not follow a pattern with certain foods. Said that her pain started when initiating semaglutide , but has discontinued this medication since and pain has persisted. Reports that sometimes her abdominal pain goes to her back on her left side only. Reports that advil and Librax is the only thing that relieves her pain. Is unable to take Librax with Xanax , so is unable to take this consistently. Has nausea intermittently, but notices this occurs when she does not take her glipizide  with food. Denies any vomiting, diarrhea, constipation, or blood in stool. Endorses loss of appetite due to abdominal pain, and does not want to eat when her stomach bothers her.   Has been seen for this before at our office, and has had both ultrasounds and CT of the abdomen. Both negative. Has had alpha gal panel and assessed for parasites in the stool, which were also negative. Has had x-rays of the spine and chest x-ray. X-rays of the spine showed degenerative changes. Chest x-ray was unremarkable. Was admitted to the hospital on 10/16/24 due to left-sided pyelonephritis, mild hydronephrosis with calculus in the renal pelvis. Has followed up with GI since admission, and had colonoscopy and capsule endoscopy. No abnormalities were found. Was prescribed Remeron  for appetite, and will discontinue Zoloft . Has not started the new medication, as she has to taper off of Zoloft  first. Plans to start the new medication on Monday.   Has a history of type 2 DM. Says that blood sugars  have been in the 250's at home. Is only taking glipizide  for diabetes. Is worried about starting new diabetic medications due to abdominal pain, and her pain starting after taking semaglutide .    Review of Systems  Constitutional:  Positive for appetite change and unexpected weight change. Negative for activity change, fatigue and fever.  Respiratory:  Negative for cough, chest tightness, shortness of breath and wheezing.   Cardiovascular:  Negative for chest pain, palpitations and leg swelling.  Gastrointestinal:  Positive for abdominal pain and nausea. Negative for constipation, diarrhea and vomiting.  Genitourinary:  Negative for difficulty urinating, flank pain, pelvic pain, vaginal bleeding and vaginal discharge.  Musculoskeletal:  Positive for back pain.       Objective:    Today's Vitals   12/24/24 0831  BP: 107/65  Pulse: 91  Temp: (!) 97.5 F (36.4 C)  SpO2: 95%  Weight: 129 lb (58.5 kg)  Height: 5' 3 (1.6 m)   Body mass index is 22.85 kg/m.   Physical Exam Vitals and nursing note reviewed.  Constitutional:      General: She is not in acute distress.    Appearance: Normal appearance. She is not ill-appearing or toxic-appearing.  Cardiovascular:     Rate and Rhythm: Normal rate and regular rhythm.     Heart sounds: Normal heart sounds, S1 normal and S2 normal. No murmur heard. Pulmonary:     Effort: Pulmonary effort is normal. No respiratory distress.  Breath sounds: Normal breath sounds. No wheezing.  Abdominal:     General: Abdomen is flat. There is no distension.     Palpations: Abdomen is soft.     Tenderness: There is abdominal tenderness in the right lower quadrant and left lower quadrant. There is no right CVA tenderness, left CVA tenderness or guarding.     Comments: No obvious masses or abnormalities palpated on exam.   Neurological:     Mental Status: She is alert.  Psychiatric:        Mood and Affect: Mood normal.        Behavior: Behavior normal.         Thought Content: Thought content normal.        Judgment: Judgment normal.    No results found for any visits on 12/24/24.    Assessment & Plan:  1. Left sided abdominal pain (Primary) -Patient looks good on exam today, non-toxic appearing. The patient has undergone extensive prior imaging and diagnostic testing, with an appropriate work-up at GI. However, given her history of pyelonephritis and hydronephrosis, along with current left-sided abdominal pain radiating to the back, further evaluation with urinalysis and renal ultrasound is indicated to assess for possible urinary tract or renal pathology.  -Patient reports relief from advil. Recommended meloxicam  for pain relief. Educated her to avoid taking meloxicam  simultaneously with advil, as this is another NSAID. Patient's GERD stable at this time, and is not currently taking blood thinners. Educated patient on appropriate use and potential side effects.   - Urinalysis, Routine w reflex microscopic - US  Renal - CBC with Differential - meloxicam  (MOBIC ) 7.5 MG tablet; Take 1 tablet (7.5 mg total) by mouth daily.  Dispense: 30 tablet; Refill: 0  2. Weight loss -Recommended patient to discontinue Zoloft , and start Remeron  on Monday to help with appetite.  -Given the patient's ongoing weight loss and poor tolerance of multiple oral and non-insulin  diabetic medications due to gastrointestinal and abdominal issues, long-term insulin  therapy was discussed as a reasonable treatment option for the future. At this time, no additional non-insulin  medications are recommended. The patient was counseled that her blood glucose levels remain persistently elevated, which may be contributing to her weight loss. Will check labs, and plan to follow-up in 2-3 weeks with Dr. Glendia for initiation of long-acting insulin  for management of diabetes and weight loss.   3. Hypokalemia -Given the patient's history of hypokalemia and ongoing abdominal pain, labs are  warranted to assess for electrolyte abnormalities that may be contributing to her symptoms or place her at risk for complications.  - Comprehensive metabolic panel   4. Hyperlipidemia associated with type 2 diabetes mellitus (HCC) -See above.   -Discussed plan with Dr. Glendia, and he agrees with plan.   Return in about 3 weeks (around 01/14/2025).  Damien KATHEE Pringle, FNP  "

## 2024-12-25 ENCOUNTER — Ambulatory Visit (HOSPITAL_COMMUNITY)
Admission: RE | Admit: 2024-12-25 | Discharge: 2024-12-25 | Disposition: A | Discharge status: Home / Self Care | Source: Ambulatory Visit

## 2024-12-25 DIAGNOSIS — Z87448 Personal history of other diseases of urinary system: Secondary | ICD-10-CM | POA: Diagnosis not present

## 2024-12-25 DIAGNOSIS — R109 Unspecified abdominal pain: Secondary | ICD-10-CM | POA: Diagnosis present

## 2024-12-25 LAB — CBC WITH DIFFERENTIAL/PLATELET
Basophils Absolute: 0 x10E3/uL (ref 0.0–0.2)
Basos: 1 %
EOS (ABSOLUTE): 0.1 x10E3/uL (ref 0.0–0.4)
Eos: 1 %
Hematocrit: 42.7 % (ref 34.0–46.6)
Hemoglobin: 14 g/dL (ref 11.1–15.9)
Immature Grans (Abs): 0 x10E3/uL (ref 0.0–0.1)
Immature Granulocytes: 0 %
Lymphocytes Absolute: 1.6 x10E3/uL (ref 0.7–3.1)
Lymphs: 20 %
MCH: 32.1 pg (ref 26.6–33.0)
MCHC: 32.8 g/dL (ref 31.5–35.7)
MCV: 98 fL — ABNORMAL HIGH (ref 79–97)
Monocytes Absolute: 0.6 x10E3/uL (ref 0.1–0.9)
Monocytes: 7 %
Neutrophils Absolute: 5.8 x10E3/uL (ref 1.4–7.0)
Neutrophils: 71 %
Platelets: 226 x10E3/uL (ref 150–450)
RBC: 4.36 x10E6/uL (ref 3.77–5.28)
RDW: 12.5 % (ref 11.7–15.4)
WBC: 8.2 x10E3/uL (ref 3.4–10.8)

## 2024-12-25 LAB — COMPREHENSIVE METABOLIC PANEL WITH GFR
ALT: 18 IU/L (ref 0–32)
AST: 18 IU/L (ref 0–40)
Albumin: 4.6 g/dL (ref 3.8–4.8)
Alkaline Phosphatase: 90 IU/L (ref 49–135)
BUN/Creatinine Ratio: 19 (ref 12–28)
BUN: 12 mg/dL (ref 8–27)
Bilirubin Total: 0.5 mg/dL (ref 0.0–1.2)
CO2: 22 mmol/L (ref 20–29)
Calcium: 10.6 mg/dL — ABNORMAL HIGH (ref 8.7–10.3)
Chloride: 99 mmol/L (ref 96–106)
Creatinine, Ser: 0.62 mg/dL (ref 0.57–1.00)
Globulin, Total: 2.3 g/dL (ref 1.5–4.5)
Glucose: 264 mg/dL — ABNORMAL HIGH (ref 70–99)
Potassium: 3.9 mmol/L (ref 3.5–5.2)
Sodium: 139 mmol/L (ref 134–144)
Total Protein: 6.9 g/dL (ref 6.0–8.5)
eGFR: 90 mL/min/1.73

## 2024-12-26 ENCOUNTER — Ambulatory Visit: Payer: Self-pay

## 2024-12-26 ENCOUNTER — Inpatient Hospital Stay: Attending: Oncology | Admitting: Oncology

## 2024-12-26 ENCOUNTER — Inpatient Hospital Stay

## 2024-12-26 VITALS — BP 140/72 | HR 96 | Temp 98.0°F | Resp 18 | Ht 63.5 in | Wt 127.0 lb

## 2024-12-26 DIAGNOSIS — R634 Abnormal weight loss: Secondary | ICD-10-CM | POA: Diagnosis not present

## 2024-12-26 DIAGNOSIS — R1030 Lower abdominal pain, unspecified: Secondary | ICD-10-CM | POA: Diagnosis not present

## 2024-12-26 LAB — CBC WITH DIFFERENTIAL/PLATELET
Abs Immature Granulocytes: 0.02 K/uL (ref 0.00–0.07)
Basophils Absolute: 0.1 K/uL (ref 0.0–0.1)
Basophils Relative: 1 %
Eosinophils Absolute: 0 K/uL (ref 0.0–0.5)
Eosinophils Relative: 0 %
HCT: 37.1 % (ref 36.0–46.0)
Hemoglobin: 12.4 g/dL (ref 12.0–15.0)
Immature Granulocytes: 0 %
Lymphocytes Relative: 15 %
Lymphs Abs: 1.4 K/uL (ref 0.7–4.0)
MCH: 31.4 pg (ref 26.0–34.0)
MCHC: 33.4 g/dL (ref 30.0–36.0)
MCV: 93.9 fL (ref 80.0–100.0)
Monocytes Absolute: 0.6 K/uL (ref 0.1–1.0)
Monocytes Relative: 6 %
Neutro Abs: 6.9 K/uL (ref 1.7–7.7)
Neutrophils Relative %: 78 %
Platelets: 191 K/uL (ref 150–400)
RBC: 3.95 MIL/uL (ref 3.87–5.11)
RDW: 13.1 % (ref 11.5–15.5)
WBC: 8.9 K/uL (ref 4.0–10.5)
nRBC: 0 % (ref 0.0–0.2)

## 2024-12-26 LAB — COMPREHENSIVE METABOLIC PANEL WITH GFR
ALT: 22 U/L (ref 0–44)
AST: 23 U/L (ref 15–41)
Albumin: 4.3 g/dL (ref 3.5–5.0)
Alkaline Phosphatase: 97 U/L (ref 38–126)
Anion gap: 17 — ABNORMAL HIGH (ref 5–15)
BUN: 11 mg/dL (ref 8–23)
CO2: 22 mmol/L (ref 22–32)
Calcium: 10.1 mg/dL (ref 8.9–10.3)
Chloride: 99 mmol/L (ref 98–111)
Creatinine, Ser: 0.53 mg/dL (ref 0.44–1.00)
GFR, Estimated: >60 mL/min
Glucose, Bld: 252 mg/dL — ABNORMAL HIGH (ref 70–99)
Potassium: 3.5 mmol/L (ref 3.5–5.1)
Sodium: 138 mmol/L (ref 135–145)
Total Bilirubin: 0.5 mg/dL (ref 0.0–1.2)
Total Protein: 6.6 g/dL (ref 6.5–8.1)

## 2024-12-26 LAB — IRON AND TIBC
Iron: 66 ug/dL (ref 28–170)
Saturation Ratios: 24 % (ref 10.4–31.8)
TIBC: 272 ug/dL (ref 250–450)
UIBC: 206 ug/dL

## 2024-12-26 LAB — VITAMIN B12: Vitamin B-12: 556 pg/mL (ref 180–914)

## 2024-12-26 LAB — FERRITIN: Ferritin: 356 ng/mL — ABNORMAL HIGH (ref 11–307)

## 2024-12-26 LAB — FOLATE: Folate: 11.3 ng/mL

## 2024-12-26 LAB — VITAMIN D 25 HYDROXY (VIT D DEFICIENCY, FRACTURES): Vit D, 25-Hydroxy: 44.4 ng/mL (ref 30–100)

## 2024-12-26 MED ORDER — DEXAMETHASONE SOD PHOSPHATE PF 10 MG/ML IJ SOLN
10.0000 mg | Freq: Once | INTRAMUSCULAR | Status: AC
  Administered 2024-12-26: 10 mg via INTRAVENOUS

## 2024-12-26 MED ORDER — SODIUM CHLORIDE 0.9 % IV SOLN: INTRAVENOUS | Status: DC

## 2024-12-26 NOTE — Progress Notes (Signed)
 " [facility] [phone]   [phone]  INITIAL CONSULTATION:  Patient Care Team: Alphonsa [name], MD as PCP - General (Family Medicine) Delford Maude BROCKS, MD as PCP - Cardiology (Cardiology)  CHIEF COMPLAINTS/PURPOSE OF CONSULTATION:  Weight loss and abdominal pain.  HISTORY OF PRESENTING ILLNESS:  Christie [name] 79 y.o. female with medical history significant for external vulvar cancer, diabetes, depression, hypertension, IBS with generalized abdominal pain, weight loss and frequent UTIs.  Patient has had abdominal pain and weight loss greater than 50 pounds since July 2025.  Reports she noticed the pain when she started semaglutide .  She stopped the medication but pain has persisted.  She has been taking Advil and Librax.  Workup to date includes alpha gal panel and to assess for parasites in the stool which were negative.  She was recently recently hospitalized (10/16/2024) for pyelonephritis with positive urine culture for E. coli.  She was treated with IV ceftriaxone .  CT abdomen/pelvis showed left-sided pyelonephritis, correlation with urinalysis recommended.  No drainable fluid collection or abscess.  Left renal pelvis and inferior pole calculi and mild left hydronephrosis.  No bowel obstruction.  Normal appendix.  Patient had surgery for vulvar lesion back in 1999 by Dr. Waneta.  She had a total hysterectomy in 1992.  Patient recently had colonoscopy (11/12/2024) for same abdominal pain with Dr. Eartha which showed two 3 to 4 mm polyps in the transverse colon, perianal rash and nonbleeding external hemorrhoids.  Patient continued to have persistent left-sided abdominal pain that radiates to her back she had a renal ultrasound to rule out renal pathology.  Patient had renal ultrasound yesterday which did not reveal hydronephrosis or nephrolithiasis.  She was prescribed Remeron  to help with her appetite and she was asked to  discontinue Zoloft .  She has not started taking Remeron  yet as she has not fully weaned off her Zoloft .  She plans on starting this on Monday.  She has a strong family history of pancreatic cancer with mother being diagnosed at 76 and brother deceased age 105.  The sister who is deceased with renal carcinoma.  Other family history of cancers include breast and lung cancer.  She has personal history of vulvar cancer and this was removed surgically but did not require any additional treatment such as radiation or chemotherapy.  On exam today patient reports she feels weak and tired due to loss of appetite and lack of eating.  She is here today with her daughter.  Appetite is 25% energy levels are very low.  She has left quadrant abdominal pain and fairly severe chronic back pain.  Reports she is currently rotating meloxicam  and ibuprofen without significant relief.  She has occasional dizziness and headaches.  And trouble sleeping.  Most recent labs from 12/24/2024 show an elevated glucose at 264, elevated calcium  level 10.6, normal kidney function and LFTs.  CBC unremarkable.   MEDICAL HISTORY:  Past Medical History:  Diagnosis Date   Cancer [facility]) 1999   external vulva lesion   Depression    DM (diabetes mellitus) (HCC) 2012   oral medications   Elevated liver enzymes    Hearing deficit    Heart murmur    History of kidney stones    Hyperlipidemia    Hyperlipidemia associated with type 2 diabetes mellitus (HCC) 02/25/2020   Hypertension 2000   Spastic colon     SURGICAL HISTORY: Past Surgical History:  Procedure Laterality Date   ANTERIOR LAT LUMBAR  FUSION Right 07/04/2017   Procedure: Right LUMBAR TWO-THREE LUMBAR THREE-FOUR LUMBAR FOUR-FIVE Anterior lateral lumbar interbody fusion with percutaneous pedicle screws;  Surgeon: Unice Pac, MD;  Location: [facility] OR;  Service: Neurosurgery;  Laterality: Right;  Right lateral approach    ANTERIOR LATERAL LUMBAR FUSION WITH PERCUTANEOUS SCREW 3  LEVEL N/A 07/04/2017   Procedure: PERCUTANEOUS SCREW THREE LEVEL LUMBAR TWO-THREE LUMBAR THREE-FOUR LUMBAR FOUR-FIVE;  Surgeon: Unice Pac, MD;  Location: [facility] OR;  Service: Neurosurgery;  Laterality: N/A;  Prone   BACK SURGERY  2004 and 2017   BIOPSY  02/23/2021   Procedure: BIOPSY;  Surgeon: Eartha Angelia Sieving, MD;  [facility];  Service: Gastroenterology;;   CHOLECYSTECTOMY     COLONOSCOPY  01/2004   COLONOSCOPY N/A 02/13/2014   Procedure: COLONOSCOPY;  Surgeon: Claudis RAYMOND Rivet, MD;  Location: [facility];  Service: Endoscopy;  Laterality: N/A;  930-moved to 100 Ann notified pt   COLONOSCOPY N/A 11/11/2024   Procedure: COLONOSCOPY;  Surgeon: Eartha Angelia Sieving, MD;  Location: [facility];  Service: Gastroenterology;  Laterality: N/A;  10:00am, asa 1-2   COLONOSCOPY WITH PROPOFOL  N/A 02/23/2021   Procedure: COLONOSCOPY WITH PROPOFOL ;  Surgeon: Eartha Angelia Sieving, MD;  [facility];  Service: Gastroenterology;  Laterality: N/A;  AM   ESOPHAGOGASTRODUODENOSCOPY  09/2005   ESOPHAGOGASTRODUODENOSCOPY N/A 08/22/2024   Procedure: EGD (ESOPHAGOGASTRODUODENOSCOPY);  Surgeon: Eartha Angelia, Sieving, MD;  [facility]: [facility];  Service: Gastroenterology;  Laterality: N/A;  8:00 am, asa 1-2   EYE SURGERY Right    catatract removed   EYE SURGERY Right    macular   surgery    GIVENS CAPSULE STUDY N/A 12/06/2024   Procedure: IMAGING PROCEDURE, GI TRACT, INTRALUMINAL, VIA CAPSULE;  Surgeon: Eartha Angelia Sieving, MD;  [facility]: [facility];  Service: Gastroenterology;  Laterality: N/A;  7:30am   KIDNEY STONE SURGERY     for a staghorn kidney stone   POLYPECTOMY  02/23/2021   Procedure: POLYPECTOMY;  Surgeon: Eartha Angelia Sieving, MD;  [facility]: [facility];  Service: Gastroenterology;;   STAPEDECTOMY Bilateral    Right 1991 and Left 1994   TOTAL ABDOMINAL HYSTERECTOMY  1982   vulva cancer surgery  1999    SOCIAL HISTORY: Social  History   Socioeconomic History   Marital status: Widowed    Spouse name: Not on file   Number of children: Not on file   Years of education: Not on file   Highest education level: Not on file  Occupational History   Not on file  Tobacco Use   Smoking status: Never   Smokeless tobacco: Never  Vaping Use   Vaping status: Never Used  Substance and Sexual Activity   Alcohol use: No    Alcohol/week: 0.0 standard drinks of alcohol   Drug use: No   Sexual activity: Not on file  Other Topics Concern   Not on file  Social History Narrative   Not on file   Social Drivers of Health   Tobacco Use: Low Risk (12/24/2024)   Patient History    Smoking Tobacco Use: Never    Smokeless Tobacco Use: Never    Passive Exposure: Not on file  Financial Resource Strain: Not on file  Food Insecurity: No Food Insecurity (10/21/2024)   Epic    Worried About Programme Researcher, Broadcasting/film/video in the Last Year: Never true    Ran Out of Food in the Last Year: Never true  Transportation Needs: No Transportation Needs (10/21/2024)  Epic    Lack of Transportation (Medical): No    Lack of Transportation (Non-Medical): No  Physical Activity: Not on file  Stress: Not on file  Social Connections: Moderately Integrated (10/16/2024)   Social Connection and Isolation Panel    Frequency of Communication with Friends and Family: More than three times a week    Frequency of Social Gatherings with Friends and Family: More than three times a week    Attends Religious Services: More than 4 times per year    Active Member of [employer] or Organizations: Yes    Attends Banker Meetings: More than 4 times per year    Marital Status: Widowed  Intimate Partner Violence: Not At Risk (10/21/2024)   Epic    Fear of Current or Ex-Partner: No    Emotionally Abused: No    Physically Abused: No    Sexually Abused: No  Depression (PHQ2-9): Low Risk (12/26/2024)   Depression (PHQ2-9)    PHQ-2 Score: 1  Alcohol Screen: Not on  file  Housing: Unknown (10/21/2024)   Epic    Unable to Pay for Housing in the Last Year: No    Number of Times Moved in the Last Year: Not on file    Homeless in the Last Year: No  Utilities: Not At Risk (10/21/2024)   Epic    Threatened with loss of utilities: No  Health Literacy: Not on file    FAMILY HISTORY: Family History  Problem Relation Age of Onset   Diabetes Father    Hypertension Father    Dementia Father    Pancreatic cancer Mother     ALLERGIES:  is allergic to ace inhibitors, atorvastatin, gabapentin , metformin and related, and oxycodone-acetaminophen .  MEDICATIONS:  Current Outpatient Medications  Medication Sig Dispense Refill   acetaminophen  (TYLENOL ) 500 MG tablet Take 1,000 mg by mouth every 6 (six) hours as needed (pain).     ALPRAZolam  (XANAX ) 0.5 MG tablet TAKE 1 TABLET BY MOUTH AT BEDTIME. 30 tablet 5   amLODipine  (NORVASC ) 10 MG tablet Take 1 tablet (10 mg total) by mouth daily. 90 tablet 1   Ascorbic Acid  (VITAMIN C ) 1000 MG tablet Take 1,000 mg by mouth daily.     cholecalciferol  (VITAMIN D ) 1000 UNITS tablet Take 1,000 Units by mouth daily.     clidinium-chlordiazePOXIDE  (LIBRAX) 5-2.5 MG capsule Take 1 capsule by mouth 3 (three) times daily before meals. 270 capsule 1   Elderberry-Vitamin C -Zinc (ELDERBERRY EXTRACT PO) Take by mouth. One tablespoon per day.     fluticasone  (FLONASE ) 50 MCG/ACT nasal spray Place 2 sprays into both nostrils daily. 1 g 0   glipiZIDE  (GLUCOTROL ) 5 MG tablet 2 in the am 2 in the pm 360 tablet 1   HYDROcodone -acetaminophen  (NORCO/VICODIN) 5-325 MG tablet 1/2 to 1 q 6 hours prn pain 20 tablet 0   meloxicam  (MOBIC ) 7.5 MG tablet Take 1 tablet (7.5 mg total) by mouth daily. 30 tablet 0   Multiple Vitamins-Minerals (MACULAR VITAMIN BENEFIT PO) Take 1 tablet by mouth in the morning and at bedtime.     omeprazole  (PRILOSEC) 40 MG capsule Take 1 capsule (40 mg total) by mouth daily. 90 capsule 3   ondansetron  (ZOFRAN ) 4 MG tablet  Take 1 tablet (4 mg total) by mouth every 8 (eight) hours as needed for nausea or vomiting. 30 tablet 1   OVER THE COUNTER MEDICATION IB Guard BID     potassium chloride  (KLOR-CON  M) 10 MEQ tablet Take 1 tablet (10 mEq  total) by mouth 2 (two) times daily. 180 tablet 1   rosuvastatin  (CRESTOR ) 40 MG tablet Take 1 tablet (40 mg total) by mouth daily. 90 tablet 1   valsartan  (DIOVAN ) 160 MG tablet TAKE ONE TABLET BY MOUTH TWICE DAILY 90 tablet 3   zinc gluconate 50 MG tablet Take 50 mg by mouth daily.     alendronate  (FOSAMAX ) 70 MG tablet Take 1 tablet (70 mg total) by mouth once a week. Take with a full glass of water  on an empty stomach. (Patient not taking: Reported on 12/26/2024) 12 tablet 3   mirtazapine  (REMERON ) 15 MG tablet Take 0.5 tablets (7.5 mg total) by mouth at bedtime. (Patient not taking: Reported on 12/26/2024) 30 tablet 0   No current facility-administered medications for this visit.    REVIEW OF SYSTEMS:   Review of Systems  Constitutional:  Positive for malaise/fatigue and weight loss.  Gastrointestinal:  Positive for abdominal pain.  Neurological:  Positive for dizziness, weakness and headaches.  Psychiatric/Behavioral:  The patient has insomnia.      PHYSICAL EXAMINATION: ECOG PERFORMANCE STATUS: 1 - Symptomatic but completely ambulatory  Vitals:   12/26/24 0836 12/26/24 0837  BP: (!) 149/75 (!) 140/72  Pulse: 96   Resp: 18   Temp: 98 F (36.7 C)   SpO2: 100%    Filed Weights   12/26/24 0836  Weight: 127 lb (57.6 kg)    Physical Exam Constitutional:      Appearance: Normal appearance.  HENT:     Head: Normocephalic and atraumatic.  Eyes:     Pupils: Pupils are equal, round, and reactive to light.  Cardiovascular:     Rate and Rhythm: Normal rate and regular rhythm.     Heart sounds: Normal heart sounds. No murmur heard. Pulmonary:     Effort: Pulmonary effort is normal.     Breath sounds: Normal breath sounds. No wheezing.  Abdominal:     General:  Bowel sounds are normal. There is no distension.     Palpations: Abdomen is soft.     Tenderness: There is no abdominal tenderness.  Musculoskeletal:        General: Normal range of motion.     Cervical back: Normal range of motion.  Skin:    General: Skin is warm and dry.     Findings: No rash.  Neurological:     Mental Status: She is alert and oriented to person, place, and time.  Psychiatric:        Judgment: Judgment normal.      LABORATORY DATA:  I have reviewed the data as listed    Latest Ref Rng & Units 12/26/2024    9:31 AM 12/24/2024   10:05 AM 12/02/2024   10:32 AM  CBC  WBC 4.0 - 10.5 K/uL 8.9  8.2  9.5   Hemoglobin 12.0 - 15.0 g/dL 87.5  85.9  86.0   Hematocrit 36.0 - 46.0 % 37.1  42.7  42.0   Platelets 150 - 400 K/uL 191  226  233        Latest Ref Rng & Units 12/26/2024    9:31 AM 12/24/2024   10:05 AM 12/02/2024   10:32 AM  CMP  Glucose 70 - 99 mg/dL 747  735  740   BUN 8 - 23 mg/dL 11  12  10    Creatinine 0.44 - 1.00 mg/dL 9.46  9.37  9.33   Sodium 135 - 145 mmol/L 138  139  138   Potassium  3.5 - 5.1 mmol/L 3.5  3.9  3.4   Chloride 98 - 111 mmol/L 99  99  98   CO2 22 - 32 mmol/L 22  22  22    Calcium  8.9 - 10.3 mg/dL 89.8  89.3  89.7   Total Protein 6.5 - 8.1 g/dL 6.6  6.9  6.4   Total Bilirubin 0.0 - 1.2 mg/dL 0.5  0.5  0.5   Alkaline Phos 38 - 126 U/L 97  90  80   AST 15 - 41 U/L 23  18  17    ALT 0 - 44 U/L 22  18  14       RADIOGRAPHIC STUDIES: I have personally reviewed the radiological images as listed and agreed with the findings in the report. US  Renal Result Date: 12/25/2024 EXAM: RETROPERITONEAL ULTRASOUND OF THE KIDNEYS 12/25/2024 12:41:02 PM TECHNIQUE: Real-time ultrasonography of the retroperitoneum, specifically the kidneys and urinary bladder, was performed. COMPARISON: Ultrasound dated 10/16/2024. CLINICAL HISTORY: Abdominal pain, history of pyelonephritis and hydronephrosis. FINDINGS: RIGHT KIDNEY: Right kidney measures 11.3 x 4.9 x 7.2 cm.  Normal cortical echogenicity. No hydronephrosis. No calculus. No mass. LEFT KIDNEY: Left kidney measures 10.7 x 4.9 x 3.8 cm. Normal cortical echogenicity. No hydronephrosis. No calculus. No mass. Left upper pole cyst is unchanged measuring 2.1 cm. BLADDER: The urinary bladder is decompressed, but otherwise without focal abnormality. IMPRESSION: 1. No hydronephrosis or nephrolithiasis. . Electronically signed by: Rogelia Myers MD 12/25/2024 12:54 PM EST RP Workstation: [id number]   DG Lumbar Spine Complete Result Date: 12/23/2024 CLINICAL DATA:  Weight loss back pain EXAM: LUMBAR SPINE - COMPLETE 4+ VIEW COMPARISON:  CT 10/16/2024, lumbar radiograph 07/16/2018, CT 07/10/2024, 10/12/2020 FINDINGS: Posterior spinal fusion hardware L2 through L5 with interbody device L2-L3 and L3-L4. Anterior fusion hardware L4-L5 with interbody device. Grossly stable appearance of the hardware possible lucency about the left fixating screw at L2 on oblique view. Advanced disc space narrowing T12-L1 with sclerosis and osteophyte. Severe disc space narrowing, sclerosis and irregular arthropathy L1-L2, progressive compared with more remote exams. Question mild fragmentation appearance of the superior endplate of L2. Prominent facet degenerative changes at L5-S1. IMPRESSION: 1. Posterior and anterior fusion hardware as above. 2. Severe degenerative changes at T12-L1 and L1-L2 with slightly irregular appearing arthropathy at L1-L2. Question depression or fragmented appearance superior endplate of L1, consider correlation with CT. Electronically Signed   By: Luke Bun M.D.   On: 12/23/2024 15:32   DG Thoracic Spine W/Swimmers Result Date: 12/23/2024 CLINICAL DATA:  Weight loss back pain EXAM: THORACIC SPINE - 3 VIEWS COMPARISON:  None Available. FINDINGS: Dextroscoliosis of the thoracolumbar spine. Vertebral body heights are grossly maintained. Multilevel degenerative osteophytes. IMPRESSION: Dextroscoliosis of the thoracolumbar  spine with multilevel degenerative change. Electronically Signed   By: Luke Bun M.D.   On: 12/23/2024 15:22   DG Chest 2 View Result Date: 12/23/2024 EXAM: 2 VIEW(S) XRAY OF THE CHEST 12/23/2024 02:21:18 PM COMPARISON: None available. CLINICAL HISTORY: Weight loss back pain FINDINGS: LUNGS AND PLEURA: No focal pulmonary opacity. No pleural effusion. No pneumothorax. HEART AND MEDIASTINUM: Aortic atherosclerosis. No acute abnormality of the cardiac and mediastinal silhouettes. BONES AND SOFT TISSUES: Surgical clips in the right upper quadrant. Cholecystectomy clips. Mild dextrocurvature of the THORACIC spine. Partially visualized LUMBAR fusion hardware. Multilevel degenerative disc disease of the THORACOLUMBAR spine. IMPRESSION: 1. No acute cardiopulmonary abnormality. Electronically signed by: Rogelia Myers MD 12/23/2024 03:21 PM EST RP Workstation: [id number]    ASSESSMENT & PLAN Assessment & Plan Weight  loss Greater than 50 pounds since July 2025 but was on semaglutide  for which she has since discontinued Developed abdominal pain left-sided shortly after which has persisted. She was prescribed Remeron  although she is continuing to wean off her Zoloft  and has not started yet. Patient has history of vulvar cancer in 1999 with surgery and did not require radiation or chemotherapy. She has strong family history of pancreatic cancer as well-mother died at age 76. Given strong family history of cancer, personal history of cancer and significant unintentional weight loss recommend PET scan along with lab work today.  Will get abdominal ultrasound to rule out hepatosplenomegaly.  Recommend CA 19-9, CA125, CEA, CBC and CMP.  We will also add on nutritional labs given she does not have a great appetite and eating very little. Will bring her back after to review results. She will be receiving IV fluids today and 10 mg dexamethasone .  Lower abdominal pain She is followed by GI for this. Obtain  abdominal ultrasound to rule out hepatosplenomegaly. No explainable abnormality on most recent CT abdomen/pelvis, colonoscopy or lab work. Hypercalcemia She has mildly elevated calcium  levels on most recent lab draw at 10.6. Recommend IV fluids today and will recheck labs in a couple of weeks. She is currently not taking calcium  or vitamin D  tablets.    Orders Placed This Encounter  Procedures   NM PET Image Initial (PI) Skull Base To Thigh    Standing Status:   Future    Expected Date:   01/02/2025    Expiration Date:   12/26/2025    If indicated for the ordered procedure, I authorize the administration of a radiopharmaceutical per Radiology protocol:   Yes    Preferred imaging location?:   [facility]   US  Abdomen Limited    Standing Status:   Future    Expected Date:   01/02/2025    Expiration Date:   12/26/2025    Reason for Exam (SYMPTOM  OR DIAGNOSIS REQUIRED):   weight loss    Preferred imaging location?:   [facility]    Release to patient:   Immediate   CA 125    Standing Status:   Future    Number of Occurrences:   1    Expiration Date:   12/26/2025   CEA    Standing Status:   Future    Number of Occurrences:   1    Expiration Date:   12/26/2025   Cancer antigen 19-9    Standing Status:   Future    Number of Occurrences:   1    Expiration Date:   12/26/2025   CBC with Differential    Standing Status:   Future    Number of Occurrences:   1    Expiration Date:   12/26/2025   Comprehensive metabolic panel    Standing Status:   Future    Number of Occurrences:   1    Expiration Date:   12/26/2025   Iron and TIBC ([facility] ONLY)    Standing Status:   Future    Number of Occurrences:   1    Expected Date:   12/26/2024    Expiration Date:   12/26/2025   Ferritin    Standing Status:   Future    Number of Occurrences:   1    Expiration Date:   12/26/2025   Vitamin B12    Standing Status:   Future    Number of Occurrences:  1    Expiration Date:   12/26/2025    Folate    Standing Status:   Future    Number of Occurrences:   1    Expiration Date:   12/26/2025   Methylmalonic acid, serum    Standing Status:   Future    Number of Occurrences:   1    Expiration Date:   12/26/2025   Copper, serum    Standing Status:   Future    Number of Occurrences:   1    Expected Date:   12/26/2024    Expiration Date:   12/26/2025   Vitamin D  25 hydroxy    Standing Status:   Future    Number of Occurrences:   1    Expected Date:   12/26/2024    Expiration Date:   12/26/2025    All questions were answered. The patient knows to call the clinic with any problems, questions or concerns.  I have spent a total of 55 minutes minutes of face-to-face and non-face-to-face time, preparing to see the patient, obtaining and/or reviewing separately obtained history, performing a medically appropriate examination, counseling and educating the patient, ordering medications/tests/procedures, referring and communicating with other health care professionals, documenting clinical information in the electronic health record, independently interpreting results and communicating results to the patient, and care coordination.   Delon Hope, AGNP-C Department of Hematology/Oncology [facility]  Phone: [phone]  "

## 2024-12-26 NOTE — Patient Instructions (Signed)
 [facility]  Discharge Instructions   Rapid Diagnostic Service Visit Discharge Information and Instructions  Thank you for choosing [facility] for your healthcare needs.  Below is a summary of today's discussion, along with our contact information and an outline of what to expect next.  Reason for Visit:  Weight loss, abdominal discomfort, and frequent UTIs.  Proposed Diagnostic Care Plan: Labs today. We have ordered and scheduled a PET scan pending insurance approval. We will schedule you for an ultrasound of your abdomen. You will get 1 liter of normal saline today to aid with dehydration.   What to Expect: - Generally, when lab tests are ordered the results can take up to 1 week for results to be available.  At that point, we will contact you to discuss your results with you.  Unless there is a critical result, we will typically wait for all of your lab results to be available before contacting you. - If a biopsy is part of your Care Plan, those results can take on average 7-10 days to result.  Once results are available, we will contact you to discuss your pathology results and any next steps. - If you have additional imaging ordered, such as a CT Scan, MRI, Ultrasound, Bone Scan, or PET scan, your imaging will need to be authorized then scheduled with the earliest available appointment.  You may be asked to travel to another hospital within [facility] who has a sooner availability, please consider doing so if asked. - If you use MyChart, your results will be available to you in the MyChart portal.  Your provider will be in touch with you as soon as all of your results are available to be discussed.  Your Diagnostic Clinic Provider:  Delon Hope, NP. Your Diagnostic Navigator:  Dena Daring, RN.  Contact number [phone].  If you or your caregiver have number blocking on your cell phones, please ensure the cancer center's numbers are not  blocked.  If you are not a registered MyChart user, please consider enrolling in MyChart to receive your test results and visit notes.  You can also access your discharge instructions electronically.  MyChart also gives you an electronic means to communicate with your Care Team instead of needing to call in to the cancer center.  We appreciate you trusting us  with your healthcare and look forward to partnering with you as we work to uncover what your potential diagnosis may be.  Please do not hesitate to reach out at any point with questions or concerns.      Thank you for choosing [facility] to provide your oncology and hematology care.   To afford each patient quality time with our provider, please arrive at least 15 minutes before your scheduled appointment time. You may need to reschedule your appointment if you arrive late (10 or more minutes). Arriving late affects you and other patients whose appointments are after yours.  Also, if you miss three or more appointments without notifying the office, you may be dismissed from the clinic at the providers discretion.    Again, thank you for choosing [facility].  Our hope is that these requests will decrease the amount of time that you wait before being seen by our physicians.   If you have a lab appointment with the Cancer Center - please note that after April 8th, all labs will be drawn in the cancer center.  You  do not have to check in or register with the main entrance as you have in the past but will complete your check-in at the cancer center.            _____________________________________________________________  Should you have questions after your visit to [facility], please contact our office at [phone] and follow the prompts.  Our office hours are 8:00 a.m. to 4:30 p.m. Monday - Thursday and 8:00 a.m. to 2:30 p.m. Friday.  Please note that voicemails left after 4:00 p.m.  may not be returned until the following business day.  We are closed weekends and all major holidays.  You do have access to a nurse 24-7, just call the main number to the clinic [phone] and do not press any options, hold on the line and a nurse will answer the phone.    For prescription refill requests, have your pharmacy contact our office and allow 72 hours.    Masks are no longer required in the cancer centers. If you would like for your care team to wear a mask while they are taking care of you, please let them know. You may have one support person who is at least 81 years old accompany you for your appointments.

## 2024-12-26 NOTE — Progress Notes (Signed)
 Fluids given per orders. Patient tolerated it well without problems. Vitals stable, BP elevated, patient has not taken BP medication today, will take when she gets home. No complaints of headache,discharged home from clinic via wheelchair.  Follow up as scheduled.

## 2024-12-26 NOTE — Patient Instructions (Signed)
 [facility] - A DEPT OF [place]  Discharge Instructions: Thank you for choosing [facility] to provide your oncology and hematology care.  If you have a lab appointment with the Cancer Center - please note that after April 8th, 2024, all labs will be drawn in the cancer center.  You do not have to check in or register with the main entrance as you have in the past but will complete your check-in in the cancer center.  Wear comfortable clothing and clothing appropriate for easy access to any Portacath or PICC line.   We strive to give you quality time with your provider. You may need to reschedule your appointment if you arrive late (15 or more minutes).  Arriving late affects you and other patients whose appointments are after yours.  Also, if you miss three or more appointments without notifying the office, you may be dismissed from the clinic at the providers discretion.      For prescription refill requests, have your pharmacy contact our office and allow 72 hours for refills to be completed.    Today you received fluids and steroids today.    To help prevent nausea and vomiting after your treatment, we encourage you to take your nausea medication as directed.  BELOW ARE SYMPTOMS THAT SHOULD BE REPORTED IMMEDIATELY: *FEVER GREATER THAN 100.4 F (38 C) OR HIGHER *CHILLS OR SWEATING *NAUSEA AND VOMITING THAT IS NOT CONTROLLED WITH YOUR NAUSEA MEDICATION *UNUSUAL SHORTNESS OF BREATH *UNUSUAL BRUISING OR BLEEDING *URINARY PROBLEMS (pain or burning when urinating, or frequent urination) *BOWEL PROBLEMS (unusual diarrhea, constipation, pain near the anus) TENDERNESS IN MOUTH AND THROAT WITH OR WITHOUT PRESENCE OF ULCERS (sore throat, sores in mouth, or a toothache) UNUSUAL RASH, SWELLING OR PAIN  UNUSUAL VAGINAL DISCHARGE OR ITCHING   Items with * indicate a potential emergency and should be followed up as soon as possible or go to the  Emergency Department if any problems should occur.  Please show the CHEMOTHERAPY ALERT CARD or IMMUNOTHERAPY ALERT CARD at check-in to the Emergency Department and triage nurse.  Should you have questions after your visit or need to cancel or reschedule your appointment, please contact [facility]  - A DEPT OF [facility] [phone]  and follow the prompts.  Office hours are 8:00 a.m. to 4:30 p.m. Monday - Friday. Please note that voicemails left after 4:00 p.m. may not be returned until the following business day.  We are closed weekends and major holidays. You have access to a nurse at all times for urgent questions. Please call the main number to the clinic [phone] and follow the prompts.  For any non-urgent questions, you may also contact your provider using MyChart. We now offer e-Visits for anyone 36 and older to request care online for non-urgent symptoms. For details visit [email or web address].   Also download the MyChart app! Go to the app store, search MyChart, open the app, select [place], and log in with your MyChart username and password.

## 2024-12-27 ENCOUNTER — Other Ambulatory Visit: Payer: Self-pay | Admitting: Family Medicine

## 2024-12-27 ENCOUNTER — Other Ambulatory Visit: Payer: Self-pay

## 2024-12-27 ENCOUNTER — Telehealth: Payer: Self-pay | Admitting: Family Medicine

## 2024-12-27 ENCOUNTER — Ambulatory Visit: Payer: Self-pay | Admitting: Family Medicine

## 2024-12-27 DIAGNOSIS — R300 Dysuria: Secondary | ICD-10-CM

## 2024-12-27 LAB — CA 125: Cancer Antigen (CA) 125: 10.8 U/mL (ref 0.0–38.1)

## 2024-12-27 LAB — COPPER, SERUM: Copper: 52 ug/dL — ABNORMAL LOW (ref 80–158)

## 2024-12-27 LAB — POCT URINALYSIS DIP (CLINITEK)
Bilirubin, UA: NEGATIVE
Blood, UA: NEGATIVE
Glucose, UA: NEGATIVE mg/dL
Nitrite, UA: NEGATIVE
POC PROTEIN,UA: NEGATIVE
Spec Grav, UA: 1.01
Urobilinogen, UA: 0.2 U/dL
pH, UA: 5.5

## 2024-12-27 LAB — CANCER ANTIGEN 19-9: CA 19-9: 753 U/mL — ABNORMAL HIGH (ref 0–35)

## 2024-12-27 LAB — CEA: CEA: 2.4 ng/mL (ref 0.0–4.7)

## 2024-12-27 MED ORDER — CEPHALEXIN 500 MG PO CAPS: 500.0000 mg | ORAL_CAPSULE | Freq: Three times a day (TID) | ORAL | 0 refills | Status: AC

## 2024-12-27 NOTE — Assessment & Plan Note (Addendum)
 She has mildly elevated calcium  levels on most recent lab draw at 10.6. Recommend IV fluids today and will recheck labs in a couple of weeks. She is currently not taking calcium  or vitamin D  tablets.

## 2024-12-27 NOTE — Telephone Encounter (Signed)
 Patient today dropped off urine Under the microscope WBCs and bacteria Culture was sent Elected to treat her with Keflex  500 mg 3 times daily for 5 days She has a history of pyelonephritis in late October Patient is aware of her lab results including CA 9 19 At the same time she understands to wait until seeing what the PET scan shows  Appreciate your care  Please have your staff notify patient regarding PET scan if she is allowed to eat or drink that morning before going  Thanks-[facility]

## 2024-12-27 NOTE — Assessment & Plan Note (Addendum)
 She is followed by GI for this. Obtain abdominal ultrasound to rule out hepatosplenomegaly. No explainable abnormality on most recent CT abdomen/pelvis, colonoscopy or lab work.

## 2024-12-27 NOTE — Addendum Note (Signed)
 Addended by: Reizy Dunlow on: 12/27/2024 01:11 PM   Modules accepted: Level of Service

## 2024-12-27 NOTE — Progress Notes (Signed)
 Patient with UTI on visual exam and having some intermittent abdominal pain more than likely related to her chronic ongoing issue She will be having a PET scan coming up toward the end of next week I did discuss with her her labs and elevated CA 19 As for the urine we are doing a culture I have sent in Keflex  500 mg 3 times daily for 5 days

## 2024-12-29 LAB — METHYLMALONIC ACID, SERUM: Methylmalonic Acid, Quantitative: 98 nmol/L (ref 0–378)

## 2024-12-30 ENCOUNTER — Encounter: Payer: Self-pay | Admitting: Family Medicine

## 2024-12-30 ENCOUNTER — Ambulatory Visit: Payer: Self-pay | Admitting: Oncology

## 2024-12-30 ENCOUNTER — Other Ambulatory Visit: Payer: Self-pay | Admitting: *Deleted

## 2024-12-30 ENCOUNTER — Ambulatory Visit (HOSPITAL_COMMUNITY)
Admission: RE | Admit: 2024-12-30 | Discharge: 2024-12-30 | Disposition: A | Discharge status: Home / Self Care | Source: Ambulatory Visit | Attending: Oncology | Admitting: Oncology

## 2024-12-30 DIAGNOSIS — R1030 Lower abdominal pain, unspecified: Secondary | ICD-10-CM | POA: Diagnosis present

## 2024-12-30 DIAGNOSIS — R634 Abnormal weight loss: Secondary | ICD-10-CM | POA: Diagnosis present

## 2024-12-30 NOTE — Telephone Encounter (Signed)
 I have left her 3 messages and will continue to attempt to reach out.  If she happens to reach out to you she is to be NPO for 6 hours prior to PET.

## 2024-12-30 NOTE — Progress Notes (Signed)
Patient made aware of recommendations and verbalized understanding. 

## 2024-12-31 NOTE — Telephone Encounter (Signed)
 Nurses-please send in Diflucan  150 mg-take 1 pill #1 with 1 additional refill She may repeat in 1 week if the first pill does not help enough  Thanks-Dr. Glendia

## 2025-01-01 ENCOUNTER — Other Ambulatory Visit: Payer: Self-pay

## 2025-01-01 ENCOUNTER — Encounter (INDEPENDENT_AMBULATORY_CARE_PROVIDER_SITE_OTHER): Admitting: Ophthalmology

## 2025-01-01 ENCOUNTER — Ambulatory Visit: Payer: Self-pay | Admitting: Family Medicine

## 2025-01-01 MED ORDER — FLUCONAZOLE 150 MG PO TABS: 150.0000 mg | ORAL_TABLET | ORAL | 1 refills | Status: AC

## 2025-01-01 NOTE — Telephone Encounter (Signed)
 Mediation was sent in and patient aware

## 2025-01-01 NOTE — Telephone Encounter (Signed)
 Outbound call to patient to discuss further. Left message 1st attempt    Copied from CRM [id number]. Topic: Clinical - Medical Advice >> Jan 01, 2025  1:10 PM Shardie S wrote: Reason for CRM: Patient's daughter requesting to have a medication called in after taking antibiotics. States her mother is having discharge and itchiness.    [facility] [place], [place] - [id number] [place] [id number] Phone: [phone] Fax: [phone] Hours: Not open 24 hours

## 2025-01-01 NOTE — Telephone Encounter (Signed)
 Diflucan  was sent in patient was notified by nurse

## 2025-01-01 NOTE — Telephone Encounter (Signed)
 FYI Only or Action Required?: Action required by provider: medication refill request.  Patient was last seen in primary care on 12/24/2024 by Gerard Damien NOVAK, FNP.  Called Nurse Triage reporting Vaginal Itching.  Symptoms began 2-3 days .  Interventions attempted: Other: vaseline and Cortizone 10 cream .  Symptoms are: stable.  Triage Disposition: See PCP When Office is Open (Within 3 Days)  Patient/caregiver understands and will follow disposition?: No, wishes to speak with PCP             Reason for Disposition  [1] Vaginal itching AND [2] not improved > 3 days following Care Advice  Answer Assessment - Initial Assessment Questions Called patient at number in chart . Reached patient who reports itching problem in vaginal area started 2-3 days ago on antibiotics now cephALEXin  (KEFLEX )  for UTI takes one last one today. No discharge. No pain. UTI is clearing up on antibiotics . Only concern is itchy vaginal mild has  tried Cortizone 10 and vaseline and symptoms about the same in regards to vaginal itching. Patient requesting medication to [facility] [place], [place] and will call back with new or worsening symptoms to be seen. Will route to office for review and further advisement on patient request       1. SYMPTOM: What's the main symptom you're concerned about? (e.g., pain, itching, dryness)     Itching  2. LOCATION: Where is the  vaginal area  located? (e.g., inside/outside, left/right)     All over vaginal  3. ONSET: When did the  vaginal itch   start?     2-3 days ago  4. PAIN: Is there any pain? If Yes, ask: How bad is it? (Scale: 1-10; mild, moderate, severe)     No pain  5. ITCHING: Is there any itching? If Yes, ask: How bad is it? (Scale: 1-10; mild, moderate, severe)     Itching  6. CAUSE: What do you think is causing the discharge? Have you had the same problem before? What happened then?     Being treated for UTI right now   7. OTHER SYMPTOMS: Do you have any other symptoms? (e.g., fever, itching, vaginal bleeding, pain with urination, injury to genital area, vaginal foreign body)     Patient denies the following fever, chills, vaginal pain, vaginal discharge  Protocols used: Vaginal Symptoms-A-AH

## 2025-01-02 LAB — URINE CULTURE

## 2025-01-02 LAB — SPECIMEN STATUS REPORT

## 2025-01-03 ENCOUNTER — Encounter (HOSPITAL_COMMUNITY)
Admission: RE | Admit: 2025-01-03 | Discharge: 2025-01-03 | Disposition: A | Discharge status: Home / Self Care | Source: Ambulatory Visit | Attending: Oncology | Admitting: Oncology

## 2025-01-03 DIAGNOSIS — R634 Abnormal weight loss: Secondary | ICD-10-CM | POA: Insufficient documentation

## 2025-01-03 DIAGNOSIS — I700 Atherosclerosis of aorta: Secondary | ICD-10-CM | POA: Diagnosis not present

## 2025-01-03 DIAGNOSIS — Z9889 Other specified postprocedural states: Secondary | ICD-10-CM | POA: Diagnosis not present

## 2025-01-03 DIAGNOSIS — Z9049 Acquired absence of other specified parts of digestive tract: Secondary | ICD-10-CM | POA: Diagnosis not present

## 2025-01-03 DIAGNOSIS — R9389 Abnormal findings on diagnostic imaging of other specified body structures: Secondary | ICD-10-CM | POA: Diagnosis not present

## 2025-01-03 LAB — GLUCOSE, CAPILLARY: Glucose-Capillary: 211 mg/dL — ABNORMAL HIGH (ref 70–99)

## 2025-01-03 MED ORDER — FLUDEOXYGLUCOSE F - 18 (FDG) INJECTION
6.0000 | Freq: Once | INTRAVENOUS | Status: AC
  Administered 2025-01-03: 6.3 via INTRAVENOUS

## 2025-01-07 ENCOUNTER — Encounter: Payer: Self-pay | Admitting: Family Medicine

## 2025-01-08 ENCOUNTER — Telehealth (INDEPENDENT_AMBULATORY_CARE_PROVIDER_SITE_OTHER): Payer: Self-pay

## 2025-01-08 MED ORDER — OMEPRAZOLE 40 MG PO CPDR: 40.0000 mg | DELAYED_RELEASE_CAPSULE | Freq: Every day | ORAL | 3 refills | Status: AC

## 2025-01-08 NOTE — Telephone Encounter (Signed)
 Omeprazole  refill request received from [facility]. Patient last seen 10/11/2024 by Dr. Eartha.  I have submitted the request to [facility].

## 2025-01-15 ENCOUNTER — Ambulatory Visit: Admitting: Family Medicine

## 2025-01-15 ENCOUNTER — Encounter: Payer: Self-pay | Admitting: Family Medicine

## 2025-01-15 VITALS — BP 104/69 | HR 88 | Temp 97.3°F | Resp 100 | Ht 63.5 in | Wt 126.4 lb

## 2025-01-15 DIAGNOSIS — E119 Type 2 diabetes mellitus without complications: Secondary | ICD-10-CM

## 2025-01-15 DIAGNOSIS — R109 Unspecified abdominal pain: Secondary | ICD-10-CM

## 2025-01-15 DIAGNOSIS — G8929 Other chronic pain: Secondary | ICD-10-CM | POA: Diagnosis not present

## 2025-01-15 DIAGNOSIS — R634 Abnormal weight loss: Secondary | ICD-10-CM | POA: Diagnosis not present

## 2025-01-15 DIAGNOSIS — Z7984 Long term (current) use of oral hypoglycemic drugs: Secondary | ICD-10-CM

## 2025-01-15 DIAGNOSIS — R630 Anorexia: Secondary | ICD-10-CM | POA: Diagnosis not present

## 2025-01-15 DIAGNOSIS — R110 Nausea: Secondary | ICD-10-CM | POA: Diagnosis not present

## 2025-01-15 MED ORDER — MIRTAZAPINE 15 MG PO TABS: 15.0000 mg | ORAL_TABLET | Freq: Every day | ORAL | 5 refills | Status: AC

## 2025-01-15 NOTE — Progress Notes (Unsigned)
" ° °  Subjective:    Patient ID: Christie [name], female    DOB: [birth year], 79 y.o.   MRN: [id number]  HPI Patient is here for a 3 week follow up  Very nice patient Still having ongoing abdominal pain discomfort sometimes in the left lower abdomen sometimes right lower abdomen In addition this has a lot of pelvic pressure She has an appointment coming up with urology in because of abnormal finding on CAT scan It would be reasonable to have urology see her and possible do cystoscope because of her having ongoing intermittent lower abdominal pain as well as weight loss She continues to lose weight  Patient mentioned she has a lot of things she wants to discuss  Patient had a PET scan and is still feeling off with no energy  PET scan did not show any obvious findings of cancer So far she has had an endoscopy, colonoscopy, capsule study, CTA that did not show any significant mesenteric artery stenosis, CAT scans which did not show any tumors or growths  She has uncontrolled diabetes cannot take metformin is not a good candidate for GLP-1's glucoses are running high currently doing fingerstick glucoses Review of Systems     Objective:   Physical Exam General-in no acute distress Eyes-no discharge Lungs-respiratory rate normal, CTA CV-no murmurs,RRR Extremities skin warm dry no edema Neuro grossly normal Behavior normal, alert Abdomen soft Blood pressure sitting and standing on the low end         Assessment & Plan:  1. Chronic abdominal pain We are bumping up the dose to see if this helps if it does not help over the course the next 3 weeks recommend stop - mirtazapine  (REMERON ) 15 MG tablet; Take 1 tablet (15 mg total) by mouth at bedtime.  Dispense: 30 tablet; Refill: 5  2. Weight loss (Primary) Progressive weight loss Has ongoing intermittent abdominal pains which make it very difficult for her to eat because she gets nauseated and loses her appetite Patient does need to have  further testing to make sure there is not other issues going on Recent PET scan reassuring  3. Abdominal discomfort Will touch base with gastroenterology In my opinion patient would benefit [facility] we will work through her insurance to find out which [facility] with  4. Diabetes mellitus without complication (HCC) Not under good control glipizide  not a candidate for GLP-1's not a candidate for metformin recommend insulin  but we are gena work toward getting her on a glucometer continuous reading and doing diabetic teaching  Will also set her up for diabetic teaching once she has diabetic teaching we will move forward with her doing insulin  starting off at 4 units at night but more than likely every 4 to 5 days ramping up the insulin  until her morning sugars are near the 100 mark and  5. Nausea Gastric emptying study would be wise to rule out possibility of gastroparesis Also run the lab test to look for the possibility of Addison's disease and even checking thyroid  again In addition to this patient does have follow-up visit with oncology regarding elevated marker yet a relatively good-looking PET scan  6. Poor appetite Encourage small meals frequently  Follow-up visit after seeing urology When she sees urology hopefully they can do a cystoscope to rule out problem since she has lower pelvic pain  Patient will do lab work in the near future "

## 2025-01-16 ENCOUNTER — Telehealth: Payer: Self-pay

## 2025-01-16 ENCOUNTER — Encounter: Payer: Self-pay | Admitting: Family Medicine

## 2025-01-16 ENCOUNTER — Other Ambulatory Visit: Payer: Self-pay

## 2025-01-16 ENCOUNTER — Telehealth: Payer: Self-pay | Admitting: Family Medicine

## 2025-01-16 DIAGNOSIS — E119 Type 2 diabetes mellitus without complications: Secondary | ICD-10-CM

## 2025-01-16 MED ORDER — LANTUS SOLOSTAR 100 UNIT/ML ~~LOC~~ SOPN: PEN_INJECTOR | SUBCUTANEOUS | 6 refills | Status: AC

## 2025-01-16 MED ORDER — INSULIN PEN NEEDLE 31G X 5 MM MISC: 1.0000 | Freq: Four times a day (QID) | 5 refills | Status: AC | PRN

## 2025-01-16 NOTE — Progress Notes (Signed)
 Care Guide Pharmacy Note  01/16/2025 Name: [name] MRN: [id number] DOB: September 15, 1946  Referred By: Alphonsa [name], MD Reason for referral: Complex Care Management (Outreach to schedule with pharm d )   Christie [name] is a 79 y.o. year old female who is a primary care patient of Alphonsa [name], MD.  Christie [name] was referred to the pharmacist for assistance related to: DMII  Successful contact was made with the patient to discuss pharmacy services including being ready for the pharmacist to call at least 5 minutes before the scheduled appointment time and to have medication bottles and any blood pressure readings ready for review. The patient agreed to meet with the pharmacist via telephone visit on (date/time).02/03/2025  Jeoffrey Buffalo , RMA     [place]  [facility], [facility] Guide  Direct Dial: [phone]  Website: [email or web address]

## 2025-01-16 NOTE — Telephone Encounter (Signed)
 Nurses Patient will need to be started on insulin  1.  Please consult pharmacy team I would like to see this patient on a continuous glucose monitor Please have them find out if this is something that her [employer] would cover and if so I would like for them to work with the patient directly to set up continuous glucose monitoring -Please put in the referral #2 we will plan to start off with long-acting insulin  once each evening   3.  Family is requesting visit with nurses-Autumn for diabetic training to give insulin  shot-this needs to be set up  #4-I will go ahead and send in a prescription for the insulin  if you will send in prescription for insulin  pen needles

## 2025-01-17 LAB — ACTH: ACTH: 39.7 pg/mL (ref 7.2–63.3)

## 2025-01-17 LAB — CORTISOL: Cortisol: 23.2 ug/dL — ABNORMAL HIGH (ref 6.2–19.4)

## 2025-01-17 LAB — TSH+FREE T4
Free T4: 1.32 ng/dL (ref 0.82–1.77)
TSH: 1.54 u[IU]/mL (ref 0.450–4.500)

## 2025-01-20 ENCOUNTER — Other Ambulatory Visit: Payer: Self-pay | Admitting: Oncology

## 2025-01-20 DIAGNOSIS — R634 Abnormal weight loss: Secondary | ICD-10-CM

## 2025-01-20 DIAGNOSIS — R978 Other abnormal tumor markers: Secondary | ICD-10-CM

## 2025-01-20 NOTE — Progress Notes (Signed)
 RE: elevated CA 19-9  Discussed with MD who recommends dedicated MRI of pancreas given elevated tumor marker, weight loss of greater than 50 pounds and family history of pancreatic cancer.  Will call with results.  Delon Hope, AGNP-C Department of Hematology/Oncology [facility] at [facility]  Phone: [phone]  01/20/2025 3:51 PM

## 2025-01-21 ENCOUNTER — Encounter (HOSPITAL_COMMUNITY): Admission: RE | Admit: 2025-01-21 | Source: Ambulatory Visit

## 2025-01-21 ENCOUNTER — Encounter (HOSPITAL_COMMUNITY): Payer: Self-pay

## 2025-01-21 MED ORDER — TECHNETIUM TC 99M SULFUR COLLOID
2.0000 | Freq: Once | INTRAVENOUS | Status: AC | PRN
  Administered 2025-01-21: 2 via ORAL

## 2025-01-21 NOTE — Progress Notes (Signed)
 Patient's daughter, Damien, informed of MRCP orders and why we are ordering it.  Damien was also informed of patient's new appointment with [facility] on 02/10/25 to allow time for the MRCP to result. All questions answered.

## 2025-01-22 ENCOUNTER — Telehealth: Payer: Self-pay | Admitting: Family Medicine

## 2025-01-22 ENCOUNTER — Encounter: Payer: Self-pay | Admitting: Family Medicine

## 2025-01-22 ENCOUNTER — Other Ambulatory Visit: Payer: Self-pay | Admitting: *Deleted

## 2025-01-22 NOTE — Telephone Encounter (Signed)
 Nurses Please put in urgent consultation with [facility] gastroenterology, reason ongoing abdominal pain and weight loss Urgent rather than routine

## 2025-01-23 ENCOUNTER — Other Ambulatory Visit: Payer: Self-pay

## 2025-01-23 DIAGNOSIS — G8929 Other chronic pain: Secondary | ICD-10-CM

## 2025-01-23 DIAGNOSIS — R634 Abnormal weight loss: Secondary | ICD-10-CM

## 2025-01-23 NOTE — Telephone Encounter (Signed)
 This referral has been placed per provider notes

## 2025-01-24 ENCOUNTER — Ambulatory Visit: Payer: Self-pay | Admitting: Family Medicine

## 2025-01-24 ENCOUNTER — Other Ambulatory Visit: Payer: Self-pay | Admitting: Oncology

## 2025-01-24 MED ORDER — DIAZEPAM 5 MG PO TABS: ORAL_TABLET | ORAL | 0 refills | Status: AC

## 2025-01-24 NOTE — Progress Notes (Signed)
 Patient's daughter called requesting a prescription for claustrophobia prior to her MRI.  Delon Hope, NP will send in Valium  x 1 dose for patient to [facility].  Patient's daughter informed.

## 2025-01-24 NOTE — Telephone Encounter (Signed)
 I spoke with the patient by phone Overall she is doing well We have relayed to her regarding appointment with [facility] She has an MRI tomorrow I will be connecting with the gastroenterology team to try to get her a follow-up appointment as well She will send us  some blood pressure readings in the near future this coming week

## 2025-01-25 ENCOUNTER — Ambulatory Visit (HOSPITAL_COMMUNITY)

## 2025-01-29 ENCOUNTER — Inpatient Hospital Stay: Admitting: Oncology

## 2025-02-03 ENCOUNTER — Other Ambulatory Visit

## 2025-02-10 ENCOUNTER — Inpatient Hospital Stay: Admitting: Oncology

## 2025-02-12 ENCOUNTER — Ambulatory Visit: Admitting: Urology

## 2025-02-21 ENCOUNTER — Other Ambulatory Visit (HOSPITAL_COMMUNITY)

## 2025-02-24 ENCOUNTER — Ambulatory Visit: Admitting: Family Medicine

## 2025-03-14 ENCOUNTER — Ambulatory Visit: Admitting: Cardiovascular Disease
